# Patient Record
Sex: Male | Born: 1940
Health system: Southern US, Community
[De-identification: ages and names within clinical notes are randomized; demographics above are authoritative.]

## PROBLEM LIST (undated history)

## (undated) DIAGNOSIS — M1990 Unspecified osteoarthritis, unspecified site: Secondary | ICD-10-CM

## (undated) DIAGNOSIS — I5181 Takotsubo syndrome: Secondary | ICD-10-CM

## (undated) DIAGNOSIS — Z9889 Other specified postprocedural states: Secondary | ICD-10-CM

## (undated) DIAGNOSIS — G250 Essential tremor: Secondary | ICD-10-CM

## (undated) DIAGNOSIS — F32A Depression, unspecified: Secondary | ICD-10-CM

## (undated) DIAGNOSIS — I629 Nontraumatic intracranial hemorrhage, unspecified: Secondary | ICD-10-CM

## (undated) DIAGNOSIS — G40901 Epilepsy, unspecified, not intractable, with status epilepticus: Secondary | ICD-10-CM

## (undated) DIAGNOSIS — T4145XA Adverse effect of unspecified anesthetic, initial encounter: Secondary | ICD-10-CM

## (undated) DIAGNOSIS — T8859XA Other complications of anesthesia, initial encounter: Secondary | ICD-10-CM

## (undated) DIAGNOSIS — R251 Tremor, unspecified: Secondary | ICD-10-CM

## (undated) DIAGNOSIS — R569 Unspecified convulsions: Secondary | ICD-10-CM

## (undated) DIAGNOSIS — I5022 Chronic systolic (congestive) heart failure: Secondary | ICD-10-CM

## (undated) DIAGNOSIS — F329 Major depressive disorder, single episode, unspecified: Secondary | ICD-10-CM

## (undated) DIAGNOSIS — S0532XA Ocular laceration without prolapse or loss of intraocular tissue, left eye, initial encounter: Secondary | ICD-10-CM

## (undated) DIAGNOSIS — M654 Radial styloid tenosynovitis [de Quervain]: Secondary | ICD-10-CM

## (undated) DIAGNOSIS — R509 Fever, unspecified: Secondary | ICD-10-CM

## (undated) DIAGNOSIS — S069X9A Unspecified intracranial injury with loss of consciousness of unspecified duration, initial encounter: Secondary | ICD-10-CM

## (undated) DIAGNOSIS — S069XAA Unspecified intracranial injury with loss of consciousness status unknown, initial encounter: Secondary | ICD-10-CM

## (undated) DIAGNOSIS — R112 Nausea with vomiting, unspecified: Secondary | ICD-10-CM

## (undated) DIAGNOSIS — K219 Gastro-esophageal reflux disease without esophagitis: Secondary | ICD-10-CM

## (undated) DIAGNOSIS — S065X9A Traumatic subdural hemorrhage with loss of consciousness of unspecified duration, initial encounter: Secondary | ICD-10-CM

## (undated) DIAGNOSIS — C61 Malignant neoplasm of prostate: Secondary | ICD-10-CM

## (undated) DIAGNOSIS — G510 Bell's palsy: Secondary | ICD-10-CM

## (undated) DIAGNOSIS — S065XAA Traumatic subdural hemorrhage with loss of consciousness status unknown, initial encounter: Secondary | ICD-10-CM

## (undated) DIAGNOSIS — I428 Other cardiomyopathies: Secondary | ICD-10-CM

## (undated) DIAGNOSIS — J690 Pneumonitis due to inhalation of food and vomit: Secondary | ICD-10-CM

## (undated) DIAGNOSIS — D649 Anemia, unspecified: Secondary | ICD-10-CM

## (undated) HISTORY — DX: Unspecified intracranial injury with loss of consciousness status unknown, initial encounter: S06.9XAA

## (undated) HISTORY — DX: Anemia, unspecified: D64.9

## (undated) HISTORY — DX: Traumatic subdural hemorrhage with loss of consciousness of unspecified duration, initial encounter: S06.5X9A

## (undated) HISTORY — DX: Pneumonitis due to inhalation of food and vomit: J69.0

## (undated) HISTORY — DX: Epilepsy, unspecified, not intractable, with status epilepticus: G40.901

## (undated) HISTORY — PX: KNEE ARTHROSCOPY: SUR90

## (undated) HISTORY — DX: Fever, unspecified: R50.9

## (undated) HISTORY — DX: Nontraumatic intracranial hemorrhage, unspecified: I62.9

## (undated) HISTORY — DX: Ocular laceration without prolapse or loss of intraocular tissue, left eye, initial encounter: S05.32XA

## (undated) HISTORY — DX: Essential tremor: G25.0

## (undated) HISTORY — DX: Chronic systolic (congestive) heart failure: I50.22

## (undated) HISTORY — DX: Traumatic subdural hemorrhage with loss of consciousness status unknown, initial encounter: S06.5XAA

## (undated) HISTORY — DX: Other cardiomyopathies: I42.8

## (undated) HISTORY — PX: ANTERIOR CRUCIATE LIGAMENT REPAIR: SHX115

## (undated) HISTORY — DX: Unspecified convulsions: R56.9

## (undated) HISTORY — DX: Unspecified intracranial injury with loss of consciousness of unspecified duration, initial encounter: S06.9X9A

## (undated) HISTORY — DX: Takotsubo syndrome: I51.81

## (undated) HISTORY — PX: HEMORRHOID SURGERY: SHX153

---

## 1999-06-22 ENCOUNTER — Emergency Department (HOSPITAL_COMMUNITY): Admission: EM | Admit: 1999-06-22 | Discharge: 1999-06-22 | Payer: Self-pay | Admitting: Internal Medicine

## 2000-04-14 ENCOUNTER — Emergency Department (HOSPITAL_COMMUNITY): Admission: EM | Admit: 2000-04-14 | Discharge: 2000-04-14 | Payer: Self-pay

## 2000-04-18 ENCOUNTER — Emergency Department (HOSPITAL_COMMUNITY): Admission: EM | Admit: 2000-04-18 | Discharge: 2000-04-18 | Payer: Self-pay | Admitting: Emergency Medicine

## 2000-05-03 ENCOUNTER — Emergency Department (HOSPITAL_COMMUNITY): Admission: EM | Admit: 2000-05-03 | Discharge: 2000-05-03 | Payer: Self-pay | Admitting: Emergency Medicine

## 2000-10-03 ENCOUNTER — Encounter: Admission: RE | Admit: 2000-10-03 | Discharge: 2000-10-03 | Payer: Self-pay | Admitting: Orthopedic Surgery

## 2000-10-03 ENCOUNTER — Encounter: Payer: Self-pay | Admitting: Orthopedic Surgery

## 2000-10-13 ENCOUNTER — Encounter: Payer: Self-pay | Admitting: Orthopedic Surgery

## 2000-10-13 ENCOUNTER — Ambulatory Visit (HOSPITAL_COMMUNITY): Admission: RE | Admit: 2000-10-13 | Discharge: 2000-10-13 | Payer: Self-pay | Admitting: Orthopedic Surgery

## 2002-04-16 ENCOUNTER — Emergency Department (HOSPITAL_COMMUNITY): Admission: EM | Admit: 2002-04-16 | Discharge: 2002-04-16 | Payer: Self-pay | Admitting: Emergency Medicine

## 2002-04-16 ENCOUNTER — Encounter: Payer: Self-pay | Admitting: Emergency Medicine

## 2002-04-18 ENCOUNTER — Emergency Department (HOSPITAL_COMMUNITY): Admission: EM | Admit: 2002-04-18 | Discharge: 2002-04-18 | Payer: Self-pay | Admitting: Emergency Medicine

## 2002-04-19 ENCOUNTER — Emergency Department (HOSPITAL_COMMUNITY): Admission: EM | Admit: 2002-04-19 | Discharge: 2002-04-19 | Payer: Self-pay | Admitting: Emergency Medicine

## 2003-01-02 ENCOUNTER — Emergency Department (HOSPITAL_COMMUNITY): Admission: EM | Admit: 2003-01-02 | Discharge: 2003-01-02 | Payer: Self-pay | Admitting: Emergency Medicine

## 2003-01-07 ENCOUNTER — Emergency Department (HOSPITAL_COMMUNITY): Admission: EM | Admit: 2003-01-07 | Discharge: 2003-01-07 | Payer: Self-pay | Admitting: *Deleted

## 2003-03-09 ENCOUNTER — Ambulatory Visit (HOSPITAL_COMMUNITY): Admission: RE | Admit: 2003-03-09 | Discharge: 2003-03-09 | Payer: Self-pay | Admitting: Endocrinology

## 2004-10-23 ENCOUNTER — Ambulatory Visit (HOSPITAL_COMMUNITY): Admission: RE | Admit: 2004-10-23 | Discharge: 2004-10-23 | Payer: Self-pay | Admitting: Endocrinology

## 2005-03-01 ENCOUNTER — Ambulatory Visit (HOSPITAL_COMMUNITY): Admission: RE | Admit: 2005-03-01 | Discharge: 2005-03-01 | Payer: Self-pay | Admitting: Orthopedic Surgery

## 2005-04-11 ENCOUNTER — Encounter: Admission: RE | Admit: 2005-04-11 | Discharge: 2005-04-11 | Payer: Self-pay | Admitting: Orthopedic Surgery

## 2005-04-15 ENCOUNTER — Ambulatory Visit (HOSPITAL_COMMUNITY): Admission: RE | Admit: 2005-04-15 | Discharge: 2005-04-15 | Payer: Self-pay | Admitting: Orthopedic Surgery

## 2005-04-15 ENCOUNTER — Ambulatory Visit (HOSPITAL_BASED_OUTPATIENT_CLINIC_OR_DEPARTMENT_OTHER): Admission: RE | Admit: 2005-04-15 | Discharge: 2005-04-15 | Payer: Self-pay | Admitting: Orthopedic Surgery

## 2007-04-29 ENCOUNTER — Inpatient Hospital Stay (HOSPITAL_COMMUNITY): Admission: RE | Admit: 2007-04-29 | Discharge: 2007-05-02 | Payer: Self-pay | Admitting: Orthopedic Surgery

## 2007-04-29 HISTORY — PX: TOTAL KNEE ARTHROPLASTY: SHX125

## 2007-05-21 ENCOUNTER — Emergency Department (HOSPITAL_COMMUNITY): Admission: EM | Admit: 2007-05-21 | Discharge: 2007-05-21 | Payer: Self-pay | Admitting: Emergency Medicine

## 2007-06-29 ENCOUNTER — Ambulatory Visit (HOSPITAL_BASED_OUTPATIENT_CLINIC_OR_DEPARTMENT_OTHER): Admission: RE | Admit: 2007-06-29 | Discharge: 2007-06-29 | Payer: Self-pay | Admitting: Orthopedic Surgery

## 2007-06-29 HISTORY — PX: KNEE JOINT MANIPULATION: SHX386

## 2007-09-25 ENCOUNTER — Ambulatory Visit (HOSPITAL_COMMUNITY): Admission: RE | Admit: 2007-09-25 | Discharge: 2007-09-26 | Payer: Self-pay | Admitting: Orthopedic Surgery

## 2008-03-18 ENCOUNTER — Ambulatory Visit (HOSPITAL_COMMUNITY): Admission: RE | Admit: 2008-03-18 | Discharge: 2008-03-20 | Payer: Self-pay | Admitting: Orthopedic Surgery

## 2008-03-18 HISTORY — PX: KNEE ARTHROTOMY: SUR107

## 2009-06-02 ENCOUNTER — Emergency Department (HOSPITAL_COMMUNITY): Admission: EM | Admit: 2009-06-02 | Discharge: 2009-06-02 | Payer: Self-pay | Admitting: Family Medicine

## 2009-06-05 ENCOUNTER — Emergency Department (HOSPITAL_COMMUNITY): Admission: EM | Admit: 2009-06-05 | Discharge: 2009-06-05 | Payer: Self-pay | Admitting: Family Medicine

## 2009-06-10 ENCOUNTER — Emergency Department (HOSPITAL_COMMUNITY): Admission: EM | Admit: 2009-06-10 | Discharge: 2009-06-10 | Payer: Self-pay | Admitting: Emergency Medicine

## 2009-06-16 ENCOUNTER — Ambulatory Visit (HOSPITAL_COMMUNITY): Admission: RE | Admit: 2009-06-16 | Discharge: 2009-06-16 | Payer: Self-pay | Admitting: Urology

## 2009-06-21 ENCOUNTER — Ambulatory Visit (HOSPITAL_COMMUNITY): Admission: RE | Admit: 2009-06-21 | Discharge: 2009-06-21 | Payer: Self-pay | Admitting: Urology

## 2009-07-13 ENCOUNTER — Encounter (INDEPENDENT_AMBULATORY_CARE_PROVIDER_SITE_OTHER): Payer: Self-pay | Admitting: Interventional Radiology

## 2009-07-13 ENCOUNTER — Ambulatory Visit (HOSPITAL_COMMUNITY): Admission: RE | Admit: 2009-07-13 | Discharge: 2009-07-13 | Payer: Self-pay | Admitting: Urology

## 2009-07-25 ENCOUNTER — Ambulatory Visit: Admission: RE | Admit: 2009-07-25 | Discharge: 2009-10-13 | Payer: Self-pay | Admitting: Radiation Oncology

## 2009-09-20 ENCOUNTER — Ambulatory Visit (HOSPITAL_COMMUNITY): Admission: RE | Admit: 2009-09-20 | Discharge: 2009-09-20 | Payer: Self-pay | Admitting: Gastroenterology

## 2009-10-20 ENCOUNTER — Ambulatory Visit: Admission: RE | Admit: 2009-10-20 | Discharge: 2009-12-25 | Payer: Self-pay | Admitting: Radiation Oncology

## 2010-03-12 ENCOUNTER — Emergency Department (HOSPITAL_COMMUNITY): Admission: EM | Admit: 2010-03-12 | Discharge: 2010-03-12 | Payer: Self-pay | Admitting: Emergency Medicine

## 2010-04-12 ENCOUNTER — Ambulatory Visit (HOSPITAL_COMMUNITY): Admission: RE | Admit: 2010-04-12 | Discharge: 2010-04-12 | Payer: Self-pay | Admitting: Orthopedic Surgery

## 2010-05-03 ENCOUNTER — Ambulatory Visit (HOSPITAL_BASED_OUTPATIENT_CLINIC_OR_DEPARTMENT_OTHER): Admission: RE | Admit: 2010-05-03 | Discharge: 2010-05-03 | Payer: Self-pay | Admitting: Orthopedic Surgery

## 2010-05-03 HISTORY — PX: WRIST ARTHROSCOPY: SHX838

## 2010-11-04 ENCOUNTER — Encounter: Payer: Self-pay | Admitting: Orthopedic Surgery

## 2010-11-05 ENCOUNTER — Encounter: Payer: Self-pay | Admitting: Urology

## 2010-12-29 LAB — POCT HEMOGLOBIN-HEMACUE: Hemoglobin: 12.6 g/dL — ABNORMAL LOW (ref 13.0–17.0)

## 2010-12-29 LAB — BASIC METABOLIC PANEL
CO2: 30 mEq/L (ref 19–32)
Chloride: 99 mEq/L (ref 96–112)
Creatinine, Ser: 0.91 mg/dL (ref 0.4–1.5)
Sodium: 134 mEq/L — ABNORMAL LOW (ref 135–145)

## 2010-12-29 LAB — GLUCOSE, CAPILLARY: Glucose-Capillary: 219 mg/dL — ABNORMAL HIGH (ref 70–99)

## 2011-01-18 LAB — GLUCOSE, CAPILLARY

## 2011-01-18 LAB — CBC
Hemoglobin: 13 g/dL (ref 13.0–17.0)
MCV: 92.1 fL (ref 78.0–100.0)
Platelets: 209 10*3/uL (ref 150–400)
RDW: 14.1 % (ref 11.5–15.5)
WBC: 4.6 10*3/uL (ref 4.0–10.5)

## 2011-01-18 LAB — CREATININE, SERUM
Creatinine, Ser: 0.81 mg/dL (ref 0.4–1.5)
GFR calc Af Amer: >60 mL/min (ref >60)

## 2011-01-18 LAB — PROTIME-INR
INR: 1 (ref 0.00–1.49)
Prothrombin Time: 13.5 seconds (ref 11.6–15.2)

## 2011-01-19 LAB — PROTIME-INR
INR: 1 (ref 0.00–1.49)
Prothrombin Time: 13.3 seconds (ref 11.6–15.2)

## 2011-01-19 LAB — CBC
MCHC: 33.9 g/dL (ref 30.0–36.0)
MCV: 93.8 fL (ref 78.0–100.0)
Platelets: 215 10*3/uL (ref 150–400)
RBC: 4.26 MIL/uL (ref 4.22–5.81)
WBC: 4.9 10*3/uL (ref 4.0–10.5)

## 2011-01-19 LAB — DIFFERENTIAL
Lymphocytes Relative: 37 % (ref 12–46)
Lymphs Abs: 1.8 10*3/uL (ref 0.7–4.0)
Monocytes Relative: 8 % (ref 3–12)
Neutrophils Relative %: 52 % (ref 43–77)

## 2011-01-19 LAB — BASIC METABOLIC PANEL
GFR calc non Af Amer: >60 mL/min (ref >60)
Potassium: 4.6 mEq/L (ref 3.5–5.1)

## 2011-01-19 LAB — GLUCOSE, CAPILLARY

## 2011-01-19 LAB — APTT: aPTT: 34 seconds (ref 24–37)

## 2011-02-26 NOTE — Op Note (Signed)
NAMEWILGUS, [name] NO.:  [id number]   MEDICAL RECORD NO.:  [id number]          PATIENT TYPE:  OIB   LOCATION:  1613                         FACILITY:  [facility]   PHYSICIAN:  Ollen Gross, M.D.    DATE OF BIRTH:  12-27-1940   DATE OF PROCEDURE:  09/25/2007  DATE OF DISCHARGE:                               OPERATIVE REPORT   PREOPERATIVE DIAGNOSIS:  Arthrofibrosis. left knee.   POSTOPERATIVE DIAGNOSIS:  Arthrofibrosis. left knee.   PROCEDURE:  Left knee arthroscopy, lysis of adhesions, and manipulation.   SURGEON:  Ollen Gross, M.D.   ASSISTANT:  None.   ANESTHESIA:  General.   ESTIMATED BLOOD LOSS:  Minimal.   DRAINS:  Hemovac x1.   COMPLICATIONS:  None.   CONDITION:  Stable to recovery.   CLINICAL NOTE:  Mr. [name] is a 70 year old male who has a stiff left  total knee arthroplasty.  His initial surgery was done in July of 2008.  He progressed slowly with physical therapy and developed adhesions.  He  had a closed manipulation, and despite that has been stuck around 90  degrees of flexion with pain.  His radiographs have looked fine, with no  evidence of any prosthetic abnormalities.  He has dense scar tissue.  He  presents now for arthroscopic lysis of adhesions and manipulation.   PROCEDURE IN DETAIL:  After the successful administration of general  anesthetic, exam under anesthesia was performed.  His range of motion  was about 5-90 degrees.  A tourniquet was then placed high on the left  thigh, and his left lower extremity was prepped and draped in the usual  sterile fashion.   Standard superomedial and inferolateral incisions were made.  Inflow  cannula passed superomedial and camera passed inferolateral.  Arthroscopic visualization proceeds.  He had a significant amount of  dense scar tissue in the suprapatellar area and in the medial and  lateral gutters.  I created a superomedial portal, and I used a shaver  to debride the majority of  the scar tissue.  We then used the ArthroCare  to remove the rest.  This effectively cleared out the suprapatellar  area, leaving a nice wide open space between the undersurface of the  quad tendon and the femur.  The medial and lateral gutters were also  debrided.  I was able to debride the infrapatellar fat pad also.  The  ArthroCare was used to perform this.  There was a nodular scar on the  intercondylar notch and prosthesis, and with the shaver, I was able to  remove that without causing any harm to the tibial polyethylene.  I then  removed the arthroscopic equipment.  I went to flex the knee and was  able to get him flexed to 120 degrees.  We placed the inflow cannula  back into the joint and threaded a Hemovac drain through the inflow  cannula, subsequently removing the cannula.  The incision was then  closed with interrupted 4-0 nylon, but the drain was not sewn in.  20 mL  of 0.25% Marcaine with epinephrine were injected into the  joint.  The  drain was hooked to suction.  The knee was cleaned and dried, and a  bulky sterile dressing was applied.  The drain was hooked to suction.   He was awakened and transferred to recovery in stable condition.      Ollen Gross, M.D.  Electronically Signed     FA/MEDQ  D:  09/25/2007  T:  09/27/2007  Job:  [id number]

## 2011-02-26 NOTE — Op Note (Signed)
NAME:  FERD, HORRIGAN NO.:  [id number]   MEDICAL RECORD NO.:  [id number]          PATIENT TYPE:  AMB   LOCATION:  DAY                          FACILITY:  [facility]   PHYSICIAN:  Ollen Gross, M.D.    DATE OF BIRTH:  1941-08-19   DATE OF PROCEDURE:  06/29/2007  DATE OF DISCHARGE:                               OPERATIVE REPORT   PREOPERATIVE DIAGNOSIS:  Arthrofibrosis, left knee.   POSTOPERATIVE DIAGNOSIS:  Arthrofibrosis, left knee.   PROCEDURE:  Left knee closed manipulation.   SURGEON:  Ollen Gross, M.D. No assistant.   ANESTHESIA:  General.   COMPLICATIONS:  None.   Pre-manipulation range of motion 10-85; post-manipulation range of  motion 5-120.   BRIEF CLINICAL NOTE:  Mr. Tollison is a 70 year old male who had a left  total knee arthroplasty done approximately two months ago.  He had a  very slow start to rehab.  He has gone on to develop severe stiffness of  the knee despite extensive physical therapy.  He presents now for closed  manipulation.   PROCEDURE IN DETAIL:  After successful administration of general  anesthetic, exam under anesthesia showed range 10-85.  I placed my chest  on the proximal tibia and flexed the knee with audible lysis of  adhesions.  I was able to get him flexed to 120 degrees.  I was then  able to fully extend him also.  Patellar mobility had improved.  He was  subsequently awakened and transferred to recovery in stable condition.      Ollen Gross, M.D.  Electronically Signed     FA/MEDQ  D:  06/29/2007  T:  06/29/2007  Job:  [id number]

## 2011-02-26 NOTE — H&P (Signed)
NAME:  Dale Morrow, Dale Morrow NO.:  [id number]   MEDICAL RECORD NO.:  [id number]        PATIENT TYPE:  LINP   LOCATION:                               FACILITY:  [facility]   PHYSICIAN:  Ollen Gross, M.D.    DATE OF BIRTH:  Mar 11, 1941   DATE OF ADMISSION:  04/29/2007  DATE OF DISCHARGE:                              HISTORY & PHYSICAL   CHIEF COMPLAINT:  Left knee pain.   HISTORY OF PRESENT ILLNESS:  The patient is a 70 year old male who has  been seen by Dr. Lequita Halt for ongoing left knee pain that has been  ongoing for quite some time now, has been treated by Dr. Rennis Chris in the  past in 2004 with a knee arthroscopy.  He was told at that time he had  significant arthritis.  He has had a total of 5 or 6 scopes on the left  knee.  It is unfortunately gotten worse with time.  He is seen by Dr.  Lequita Halt in consultation and found to have significant bone-on-bone  arthritis in the left knee, it is mainly central in both the medial and  lateral compartments as well as significant patellofemoral changes.  It  is felt he has reached a point where he would benefit from undergoing  surgical intervention.  Risks and benefits have been discussed.  He  elects to proceed with surgery.  He has been seen preoperative by Dr.  Dagoberto Ligas and felt to be stable to undergo surgery.   ALLERGIES:  NO KNOWN DRUG ALLERGIES.  FOOD AND OTHER ALLERGIES:  HARD  SHELL SEAFOOD AND BEE STINGS.   CURRENT MEDICATIONS:  He is on an insulin pump, Crestor, lisinopril,  Actos, aspirin, multivitamin.   PAST MEDICAL HISTORY:  Diabetes mellitus, ulcerative colitis,  dyslipidemia, history of depression, history of dermatitis.   PAST SURGICAL HISTORY:  Multiple left knee arthroscopies, right knee ACL  repair, left knee tibial plateau fracture repair.   SOCIAL HISTORY:  Married.  Works as a Human resources officer, self-  employed.  Nonsmoker.  No alcohol.  No children.  His wife will be  assisting with care after  surgery.  Two-story home that is on the main  level, has 12 steps.   FAMILY HISTORY:  Diabetes.   REVIEW OF SYSTEMS:  GENERAL:  No fevers, chills, night sweats.  NEURO:  No seizures, syncope, paralysis.  RESPIRATORY:  No shortness of breath,  productive cough or hemoptysis.  CARDIOVASCULAR AND CHEST:  No chest  pain, orthopnea.  GI: No nausea, vomiting, diarrhea, or constipation.  Does have a history of ulcerative colitis.  GU: No dysuria, hematuria or  discharge.  MUSCULOSKELETAL: Left knee.   PHYSICAL EXAMINATION:  VITAL SIGNS: Pulse 64, respirations 12, blood  pressure 132/70.  GENERAL:  A 70 year old white male, well-nourished, well-developed, in  no acute distress, thin frame, alert and oriented, cooperative.  HEENT: Normocephalic, atraumatic.  Pupils round, reactive.  Never wore  glasses.  NECK:  Supple.  CHEST:  Clear anterior and posterior chest walls.  No rhonchi, rales or  wheezing.  HEART: Regular rate and rhythm.  No  murmur.  S1, S2 noted.  ABDOMEN:  Soft, flat, nontender.  Bowel sounds present.  RECTAL, BREASTS, GENITALIA:  Not done, per present illness.  EXTREMITIES:  Left knee shows range of motion of 10-120, marked  crepitus, no effusion, slight varus.   IMPRESSION:  1. Osteoarthritis, left knee.  2. Diabetes mellitus.  3. Ulcerative colitis.  4. Dyslipidemia.  5. Depression.  6. Dermatitis.   PLAN:  The patient will be admitted to [facility] to undergo a  left total knee replacement arthroplasty.  Surgery will be performed by  Dr. Ollen Gross, his medical physician Dr. Corrin Parker will be  notified of the room number and admission and be consulted if needed for  medical assistance with the patient throughout the hospital course.      Alexzandrew L. Perkins, P.A.C.      Ollen Gross, M.D.  Electronically Signed    ALP/MEDQ  D:  04/28/2007  T:  04/29/2007  Job:  [id number]   cc:   Alfonse Alpers. Dagoberto Ligas, M.D.  Fax: [phone]   Ollen Gross, M.D.  Fax: [phone]

## 2011-02-26 NOTE — Op Note (Signed)
NAMESAMEER, TEEPLE NO.:  [id number]   MEDICAL RECORD NO.:  [id number]          PATIENT TYPE:  INP   LOCATION:  X001                         FACILITY:  [facility]   PHYSICIAN:  Ollen Gross, M.D.    DATE OF BIRTH:  [birth year]   DATE OF PROCEDURE:  04/29/2007  DATE OF DISCHARGE:                               OPERATIVE REPORT   PREOPERATIVE DIAGNOSIS:  Osteoarthritis left knee.   POSTOPERATIVE DIAGNOSIS:  Osteoarthritis left knee.   PROCEDURE:  Left total knee arthroplasty.   SURGEON:  Ollen Gross, M.D.   ASSISTANT:  Avel Peace PA-C   ANESTHESIA:  General with postop Marcaine pain pump.   ESTIMATED BLOOD LOSS:  Minimal.   DRAINS:  None.   TOURNIQUET TIME:  33 minutes at 300 mmHg.   COMPLICATIONS:  None.   CONDITION:  Stable to recovery.   CLINICAL NOTE:  [name] is a 70 year old male who has end-stage  arthritis of his left knee with progressively worsening pain and  dysfunction.  He presents now for left total knee arthroplasty.   PROCEDURE IN DETAIL:  After successful initiation of general anesthetic,  a tourniquet placed high on the left thigh and left lower extremity  prepped and draped in usual sterile fashion.  Extremity was wrapped in  Esmarch, knee flexed, tourniquet inflated 300 mmHg.  Midline incision  made with 10 blade through subcutaneous tissue to level of the extensor  mechanism.  Fresh blade is used make a medial parapatellar arthrotomy.  Soft tissue over the proximal medial tibia subperiosteally elevated to  the joint line with the knife into the semimembranosus bursa with a Cobb  elevator.  Soft tissue laterally is elevated with attention being paid  to avoid patellar tendon on tibial tubercle.  Patella subluxed  laterally, knee flexed 90 degrees, ACL and PCL removed.  Drill was used  to create a starting hole in the distal femur and the canal was  thoroughly irrigated.  5 degrees left valgus alignment guide is placed,  referencing off the posterior condyles, rotations marked and the block  pinned to remove 10 mm of the distal femur.  Distal femoral resection is  made with an oscillating saw.  Sizing blocks placed, size 4 is most  appropriate.  Rotations marked at the epicondylar axis.  Size 4 cutting  block was placed and the anterior-posterior and chamfer cuts made.   Tibia subluxed forward and menisci are removed.  Extramedullary tibial  alignment guide is placed referencing proximally at the medial aspect of  the tibial tubercle and distally along the second metatarsal axis and  tibial crest.  Blocks pinned to remove about 8 mm off the less deficient  lateral side.  There is deficiency on both sides.  Tibial resection is  made with an oscillating saw.  Size 4 is the most appropriate tibial  component and then the proximal tibia was prepared with the modular  drill and keel punch for size 4.  Femoral preparation is completed the  intercondylar cut.   Size 4 mobile bearing tibial trial, size 4 posterior stabilized femoral  trial  and a 10-mm posterior stabilized rotating platform insert trial  are placed.  With a 10 full extensions achieved with excellent varus and  valgus balance throughout full range of motion.  The patella was  everted, thickness measured to be 24 mm.  Freehand resection taken to 14  mm, 38 template is placed, lug holes were drilled, trial patella was  placed and it tracks normally.  Osteophytes removed off the posterior  femur with the trial in place.  All trials removed and the cut bone  surfaces are prepared with pulsatile lavage.  Cement mixed and once  ready for implantation the size 4 mobile bearing tibial tray size 4  posterior stabilized femur and 38 patella are cemented into place.  The  patella was held with the clamp.  Trial 10-mm inserts placed, knee held  in full extension and all extruded cement removed.  Wound was then  copiously irrigated with saline solution.  The  trial removed and FloSeal  injected posteriorly.  The permanent 10 mm posterior stabilized rotating  platform insert is then placed in the tibial tray.  The FloSeal injected  into medial and lateral gutters and the suprapatellar area.  The  tourniquet then released with total time of 34 minutes.  Moist sponge is  held on the wound for about a minute and a half to two minutes and then  that is removed and there was minimal if any bleeding.  This stopped  with electrocautery.  Further irrigation was performed and the extensor  mechanism was closed with interrupted #1 PDS.  Flexion against gravity  is 135 degrees.  Subcu is then closed with interrupted 2-0 Vicryl  subcuticular running 4-0 Monocryl.  Catheter for Marcaine pain pump is  placed and the pump is initiated.  Steri-Strips and bulky sterile  dressing are applied.  She is placed into the knee immobilizer,  awakened, transferred to recovery in stable condition.      Ollen Gross, M.D.  Electronically Signed     FA/MEDQ  D:  04/29/2007  T:  04/30/2007  Job:  [id number]

## 2011-02-26 NOTE — Op Note (Signed)
NAME:  LUCY, WOOLEVER NO.:  [id number]   MEDICAL RECORD NO.:  [id number]          PATIENT TYPE:  OIB   LOCATION:  0098                         FAC[facility]   PHYSICIAN:  Ollen Gross, M.D.    DATE OF BIRTH:  [birth year]   DATE OF PROCEDURE:  03/18/2008  DATE OF DISCHARGE:                               OPERATIVE REPORT   PREOPERATIVE DIAGNOSIS:  Arthrofibrosis left knee.   POSTOPERATIVE DIAGNOSIS:  Arthrofibrosis left knee.   PROCEDURE:  Left knee open arthrotomy with scar excision.   SURGEON:  Ollen Gross, M.D.   ASSISTANT:  Alexzandrew L. Perkins, P.A.C.   ANESTHESIA:  General.   BLOOD LOSS:  Minimal.   DRAINS:  Hemovac times one.   TOURNIQUET TIME:  21 minutes at 300 mmHg.   COMPLICATIONS:  None.   CONDITION:  Stable to recovery.   BRIEF CLINICAL NOTE:  Mr. Swab is a 70 year old male who had a left  total knee arthroplasty done last July.  He has had a course problematic  for stiffness in his left knee.  He underwent a closed manipulation  postoperatively and then had an arthroscopy with lysis of adhesions.  He  has been stuck at about 95 degrees of flexion.  He has maintained full  extension.  He has painful stiffness of the knee.  His workup for  infection has been negative.  He presents now for open arthrotomy with  scar excision and inspection of the prosthesis.   PROCEDURE IN DETAIL:  After successful administration of general  anesthetic a tourniquet is placed high on his left thigh and left lower  extremity is prepped and draped in the usual sterile fashion.  Extremity  was wrapped in Esmarch and tourniquet inflated to 300 mmHg.  Midline  incision made with 10 blade through subcutaneous tissue to the extensor  mechanism.  A fresh blade was used to make a medial parapatellar  arthrotomy.  He had very thick scar underlining the entire extensor  mechanism and he had scarring at the infrapatellar fat pad.  I used a  knife to dissect  the interval between the undersurface of the extensor  mechanism and the scar, excising all the scar.  I started proximally,  recreating the medial and lateral gutters around the femur and  dissecting up underneath the quad muscle in order to free any scar  tissue there.  I effectively freed up proximally and then distally  excised the scar medially and laterally.  I was able to enter the plane  between the fat pad and scar and excise all the scar at the fat pad and  restore the normal height of the patella.  The patellar component looked  fine and was well fixed.  The femoral and tibial components were also  well fixed and the polyethylene showed no signs of damage.  After I  removed all the scar then I placed towel clips on the extensor mechanism  and flexion against gravity at that point is 115 degrees.  With just a  little forcing I could get him over 120.  I removed  the towel clips and  thoroughly irrigated the knee with saline solution.  I inspected again  and did not see any further scarring.  A Hemovac drain is placed and  then the knee arthrotomy was closed with interrupted #1 PDS.  Flexion  against gravity is about 105 and then with just a little bit of pressure  on the proximal tibia I got him easily past 120.  The drain was hooked  to suction and the tourniquet released for total time of 21 minutes.  Subcu was closed with interrupted 2-0 Vicryl and subcuticular running 4-  0 Monocryl.  The incisions were cleaned and dried and Steri-Strips and  sterile dressing applied.  He was then awakened and transferred to  recovery in stable condition.      Ollen Gross, M.D.  Electronically Signed     FA/MEDQ  D:  03/18/2008  T:  03/18/2008  Job:  [id number]

## 2011-02-26 NOTE — Discharge Summary (Signed)
NAMEJOSHUAJAMES, MOEHRING                ACCOUNT NO.:  [id number]   MEDICAL RECORD NO.:  [id number]          PATIENT TYPE:  INP   LOCATION:  1607                         FACILITY:  [facility]   PHYSICIAN:  Ollen Gross, M.D.    DATE OF BIRTH:  05-Jul-1941   DATE OF ADMISSION:  04/29/2007  DATE OF DISCHARGE:  05/02/2007                               DISCHARGE SUMMARY   ADMISSION DIAGNOSES:  1. Osteoarthritis left knee.  2. Diabetes mellitus.  3. Ulcerative colitis.  4. Dyslipidemia.  5. Depression.  6. Dermatitis   DISCHARGE DIAGNOSES:  1. Osteoarthritis left knee status post left total knee arthroplasty.  2. Mild postop blood loss anemia.  Did not require transfusion.  3. Diabetes mellitus.  4. Ulcerative colitis.  5. Dyslipidemia.  6. Depression.  7. Dermatitis   PROCEDURE:  April 29, 2007; left total knee.  Surgeon Dr. Lequita Halt,  assisted Alexzandrew L. Perkins, P.A.C.  Anesthesia general.   CONSULTS:  None.   BRIEF HISTORY:  Mr. Virgin is a 70 year old male with end-stage  arthritis of the left knee, progressive worsening pain, dysfunction who  now presents for total knee.   LABORATORY DATA:  Preop CBC showed hemoglobin 14.2, hematocrit of 41.5,  white cell count 4.7, postop hemoglobin down to 12.9, then 9.8, last H  and H stabilized at 9.4 and 26.7.  PT/PTT preop 13.0, 33 respectively.  INR 1.0.  Serial protimes followed; PT/INR 27.5 and 2.4.  Chem panel on  admission; slightly elevated glucose of 125.  Remaining Chem panel  within normal limits.  Serial chemistries followed, electrolytes  remained within normal limits.  Preop UA negative.  Blood group type B+.  Preop EKG on the chart dated April 06, 2007, sinus rhythm within normal  limits.  Normal EKG.   HOSPITAL COURSE:  The patient was admitted to [facility] and  tolerated procedure well, later transferred from recovery room to the  orthopedic floor start on PCA and analgesic pain control following  surgery.  He  did have some elevated serum glucose, he was on insulin  pump protocol is back on his Actos. Blood pressure was stable.  We would  recommend holding his lisinopril as his blood pressure was low.  He did  have a little bit of thigh pain which was felt to be due to the  tourniquet, otherwise he was doing pretty well. On day one, started  getting up out of bed with therapy.  By day two, he was doing well.  He  got up and ambulated about 60 feet, progressing well.  He is tolerating  his meds.  Recommend discontinuing IV fluids.  Continued working on his  mobility and he was ready to go home by the following day of May 02, 2007.   DISCHARGE/PLAN:  1. Patient was discharged home on May 02, 2007.  2. Discharge diagnoses, please see above.  3. Discharge medications; Coumadin, Percocet, Robaxin, Nu-Iron.   DIET:  Diabetic, low cholesterol diet.   FOLLOWUP:  Follow-up in two weeks.   ACTIVITY:  Weightbearing as tolerated, home health, PT home health  nursing total knee protocol.   DISPOSITION:  Home.   CONDITION ON DISCHARGE:  Improving.      Alexzandrew L. Perkins, P.A.C.      Ollen Gross, M.D.  Electronically Signed    ALP/MEDQ  D:  05/14/2007  T:  05/15/2007  Job:  [id number]   cc:   Alfonse Alpers. Dagoberto Ligas, M.D.  Fax: [phone]

## 2011-03-01 NOTE — Op Note (Signed)
NAMEJOMAR, Dale Morrow                ACCOUNT NO.:  [id number]   MEDICAL RECORD NO.:  [id number]          PATIENT TYPE:  AMB   LOCATION:  DSC                          FACILITY:  [facility]   PHYSICIAN:  Robert A. Thurston Hole, M.D. DATE OF BIRTH:  27-Oct-1940   DATE OF PROCEDURE:  04/15/2005  DATE OF DISCHARGE:                                 OPERATIVE REPORT   PREOPERATIVE DIAGNOSIS:  Left knee lateral meniscal tear with chondromalacia  and synovitis.   POSTOPERATIVE DIAGNOSIS:  Left knee lateral meniscal tear with  chondromalacia and synovitis.   PROCEDURE:  1.  Left knee examination under anesthesia, followed by arthroscopic partial      lateral meniscectomy.  2.  Left knee chondroplasty with partial synovectomy.   SURGEON:  Elana Alm. Thurston Hole, M.D.   ASSISTANT:  Julien Girt, P.A.   ANESTHESIA:  General.   OPERATIVE TIME:  30 minutes.   COMPLICATIONS:  None.   INDICATIONS FOR PROCEDURE:  Dale Morrow is a 70 year old gentleman who has  had 2 to 3 months of increasing left knee pain with exam and MRI documenting  chondromalacia, meniscal tear and synovitis who has failed conservative care  and is now to undergo arthroscopy.   DESCRIPTION OF PROCEDURE:  Dale Morrow was brought to the operating room on  April 15, 2005 and placed on the operating table in the supine position. After  an adequate level of general anesthesia was obtained, his left knee was  examined with range of motion from 0-130 degrees, 2+ crepitation, knee  stable, ligamentous exam with normal patellar tracking.  The knee was  sterilely injected with 0.25% Marcaine with epinephrine. The left leg was  prepped using sterile Hibiclens and draped using sterile technique.  He  received Ancef 1 g IV preoperatively for prophylaxis.  Initially, through an  anterolateral portal the arthroscope with a pump attached was placed into an  anteromedial portal and an arthroscopic probe was placed.  On initial  inspection of the  medial compartment he was found to have 30-40% grade 3  chondromalacia which was debrided.  The medial meniscus was intact.  The ACL  and PCL were normal.  The lateral compartment showed 30% grade 3  chondromalacia which was debrided.  He had tearing of the anterior posterior  and lateral horn of the lateral meniscus and 30% was resected back to a  stable rim. The patellofemoral joint showed 75% grade 3 chondromalacia on  the patellofemoral groove and this was debrided.  The patella tracked  normally.  There was very scarred and inflamed synovium in the medial and  lateral gutters and this was thoroughly debrided, otherwise they were free  of pathology. After this was done, it was felt that all pathology had been  satisfactorily addressed.  The instruments were removed.  The portals were  closed with 3-0 nylon suture and injected with 0.25% Marcaine with  epinephrine and 4 mg of morphine, sterile dressings applied and the patient  awakened and taken to the recovery room in stable condition.   FOLLOW-UP CARE:  Dale Morrow is to be followed as an  outpatient on Percocet  and Naprosyn.  I will see him back in the office in a week for sutures out  and followup.       RAW/MEDQ  D:  04/15/2005  T:  04/15/2005  Job:  [id number]

## 2011-07-11 LAB — COMPREHENSIVE METABOLIC PANEL
ALT: 21
AST: 33
Alkaline Phosphatase: 61
CO2: 30
Calcium: 8.9
Chloride: 100
GFR calc Af Amer: >60
GFR calc non Af Amer: >60
Potassium: 4.9
Sodium: 138
Total Bilirubin: 0.8

## 2011-07-11 LAB — URINALYSIS, ROUTINE W REFLEX MICROSCOPIC
Glucose, UA: NEGATIVE
Hgb urine dipstick: NEGATIVE
Ketones, ur: NEGATIVE
pH: 5.5

## 2011-07-11 LAB — CBC
Hemoglobin: 14.5
RBC: 4.65
WBC: 4.7

## 2011-07-22 LAB — CBC
HCT: 35.3 — ABNORMAL LOW
MCV: 86.3
RBC: 4.09 — ABNORMAL LOW
WBC: 4.7

## 2011-07-22 LAB — URINALYSIS, ROUTINE W REFLEX MICROSCOPIC
Bilirubin Urine: NEGATIVE
Glucose, UA: 500 — AB
Ketones, ur: NEGATIVE
pH: 5

## 2011-07-22 LAB — BASIC METABOLIC PANEL
BUN: 14
Chloride: 103
GFR calc Af Amer: >60
Potassium: 4

## 2011-07-25 LAB — POCT I-STAT 4, (NA,K, GLUC, HGB,HCT)
Glucose, Bld: 128 — ABNORMAL HIGH
Hemoglobin: 13.6
Potassium: 4.8

## 2011-07-29 LAB — BASIC METABOLIC PANEL
BUN: 11
BUN: 9
CO2: 27
Chloride: 99
Creatinine, Ser: 0.8
GFR calc non Af Amer: >60
Glucose, Bld: 246 — ABNORMAL HIGH
Potassium: 4.5

## 2011-07-29 LAB — CBC
HCT: 27.7 — ABNORMAL LOW
MCHC: 34.7
MCHC: 35.1
MCHC: 35.3
MCV: 88.7
MCV: 89.3
MCV: 89.5
Platelets: 154
Platelets: 160
Platelets: 215
RBC: 4.16 — ABNORMAL LOW
RDW: 13.7
RDW: 14
WBC: 8

## 2011-07-29 LAB — PROTIME-INR
Prothrombin Time: 14.6
Prothrombin Time: 27.5 — ABNORMAL HIGH

## 2011-07-29 LAB — TYPE AND SCREEN: ABO/RH(D): B POS

## 2011-07-30 LAB — CBC
MCHC: 34.3
Platelets: 240
RDW: 14.2 — ABNORMAL HIGH

## 2011-07-30 LAB — COMPREHENSIVE METABOLIC PANEL
ALT: 23
AST: 35
Calcium: 9
Creatinine, Ser: 0.85
GFR calc Af Amer: >60
Sodium: 137
Total Protein: 6.3

## 2011-07-30 LAB — URINALYSIS, ROUTINE W REFLEX MICROSCOPIC
Glucose, UA: NEGATIVE
Hgb urine dipstick: NEGATIVE
Specific Gravity, Urine: 1.01
pH: 6

## 2011-08-13 ENCOUNTER — Other Ambulatory Visit (HOSPITAL_COMMUNITY): Payer: Self-pay | Admitting: Family Medicine

## 2011-08-13 ENCOUNTER — Ambulatory Visit (HOSPITAL_COMMUNITY)
Admission: RE | Admit: 2011-08-13 | Discharge: 2011-08-13 | Disposition: A | Discharge status: Home / Self Care | Payer: Medicare Other | Source: Ambulatory Visit | Attending: Family Medicine | Admitting: Family Medicine

## 2011-08-13 DIAGNOSIS — M79609 Pain in unspecified limb: Secondary | ICD-10-CM | POA: Insufficient documentation

## 2011-08-13 DIAGNOSIS — M7989 Other specified soft tissue disorders: Secondary | ICD-10-CM | POA: Insufficient documentation

## 2011-08-20 ENCOUNTER — Ambulatory Visit (HOSPITAL_COMMUNITY): Admission: RE | Admit: 2011-08-20 | Payer: Medicare Other | Source: Ambulatory Visit

## 2011-08-20 ENCOUNTER — Ambulatory Visit (HOSPITAL_COMMUNITY)
Admission: RE | Admit: 2011-08-20 | Discharge: 2011-08-20 | Disposition: A | Discharge status: Home / Self Care | Payer: 59 | Source: Ambulatory Visit | Attending: Family Medicine | Admitting: Family Medicine

## 2011-08-20 DIAGNOSIS — M79609 Pain in unspecified limb: Secondary | ICD-10-CM

## 2011-08-20 DIAGNOSIS — M7989 Other specified soft tissue disorders: Secondary | ICD-10-CM

## 2011-08-20 DIAGNOSIS — R52 Pain, unspecified: Secondary | ICD-10-CM

## 2011-08-20 NOTE — Progress Notes (Signed)
*  PRELIMINARY RESULTS* No evidence of left lower extremity DVT, superficial thrombosis, or Baker's cyst  Dale Morrow, [place] D 08/20/2011, 1:10 PM

## 2011-12-16 ENCOUNTER — Other Ambulatory Visit (HOSPITAL_COMMUNITY): Payer: Self-pay | Admitting: Urology

## 2011-12-16 DIAGNOSIS — Z0189 Encounter for other specified special examinations: Secondary | ICD-10-CM

## 2011-12-19 ENCOUNTER — Ambulatory Visit (HOSPITAL_COMMUNITY)
Admission: RE | Admit: 2011-12-19 | Discharge: 2011-12-19 | Disposition: A | Discharge status: Home / Self Care | Payer: 59 | Source: Ambulatory Visit | Attending: Urology | Admitting: Urology

## 2011-12-19 DIAGNOSIS — Z79899 Other long term (current) drug therapy: Secondary | ICD-10-CM | POA: Insufficient documentation

## 2011-12-19 DIAGNOSIS — Z0189 Encounter for other specified special examinations: Secondary | ICD-10-CM

## 2011-12-19 DIAGNOSIS — Z13820 Encounter for screening for osteoporosis: Secondary | ICD-10-CM | POA: Insufficient documentation

## 2012-04-13 DIAGNOSIS — M654 Radial styloid tenosynovitis [de Quervain]: Secondary | ICD-10-CM | POA: Insufficient documentation

## 2012-04-13 HISTORY — DX: Radial styloid tenosynovitis (de quervain): M65.4

## 2012-04-17 ENCOUNTER — Encounter (HOSPITAL_BASED_OUTPATIENT_CLINIC_OR_DEPARTMENT_OTHER): Payer: Self-pay | Admitting: *Deleted

## 2012-04-17 NOTE — Pre-Procedure Instructions (Signed)
To come for BMET and EKG 

## 2012-04-20 ENCOUNTER — Encounter (HOSPITAL_BASED_OUTPATIENT_CLINIC_OR_DEPARTMENT_OTHER)
Admission: RE | Admit: 2012-04-20 | Discharge: 2012-04-20 | Disposition: A | Discharge status: Home / Self Care | Payer: 59 | Source: Ambulatory Visit | Attending: Orthopedic Surgery | Admitting: Orthopedic Surgery

## 2012-04-20 LAB — BASIC METABOLIC PANEL
CO2: 23 mEq/L (ref 19–32)
Glucose, Bld: 312 mg/dL — ABNORMAL HIGH (ref 70–99)
Potassium: 4.4 mEq/L (ref 3.5–5.1)
Sodium: 130 mEq/L — ABNORMAL LOW (ref 135–145)

## 2012-04-20 NOTE — H&P (Signed)
Dale Morrow is an 71 y.o. male.   Chief Complaint: c/o pain radial aspect left wrist HPI: Favio returns for follow up evaluation of his left wrist. He is very swollen at the first dorsal compartment. Seng's diabetes is under moderate control. He states his last hemoglobin A1C was 7.0. He is under treatment for prostate cancer.     Past Medical History  Diagnosis Date  . Arthritis     left knee  . Depression   . De Quervain's tenosynovitis, left 04/2012  . Diabetes mellitus     Insulin pump  . Hypertension     under control; takes BP med. to protect kidneys due to DM  . Prostate cancer, primary, with metastasis from prostate to other site     metastasis to lymph nodes  . PONV (postoperative nausea and vomiting)     Past Surgical History  Procedure Date  . Hemorrhoid surgery 1970s  . Anterior cruciate ligament repair     right knee  . Wrist arthroscopy 05/03/2010    right; and release of 1st dorsal compartment  . Knee arthrotomy 03/18/2008    left; with scar exc.  [name] Knee arthroscopy 09/25/2007; 04/15/2005    left  . Knee joint manipulation 06/29/2007    left  . Total knee arthroplasty 04/29/2007    left    History reviewed. No pertinent family history. Social History:  reports that he has never smoked. He has never used smokeless tobacco. He reports that he does not drink alcohol or use illicit drugs.  Allergies:  Allergies  Allergen Reactions  . Bee Venom Anaphylaxis  . Shellfish Allergy Anaphylaxis    No prescriptions prior to admission    Results for orders placed during the hospital encounter of 04/21/12 (from the past 48 hour(s))  BASIC METABOLIC PANEL     Status: Abnormal   Collection Time   04/20/12  9:00 AM      Component Value Range Comment   Sodium 130 (*) 135 - 145 mEq/L    Potassium 4.4  3.5 - 5.1 mEq/L    Chloride 98  96 - 112 mEq/L    CO2 23  19 - 32 mEq/L    Glucose, Bld 312 (*) 70 - 99 mg/dL    BUN 15  6 - 23 mg/dL    Creatinine, Ser 1.61  0.50 -  1.35 mg/dL    Calcium 8.4  8.4 - 09.6 mg/dL    GFR calc non Af Amer >90  >90 mL/min    GFR calc Af Amer >90  >90 mL/min     No results found.   Pertinent items are noted in HPI.  Height 5\' 8"  (1.727 m), weight 83.915 kg (185 lb).  General appearance: alert Head: Normocephalic, without obvious abnormality Neck: supple, symmetrical, trachea midline Resp: clear to auscultation bilaterally Cardio: regular rate and rhythm GI: normal findings: bowel sounds normal Extremities:Clinical exam at this time reveals that he has pain with palpation over the left first dorsal compartment. He has a markedly painful Finkelstein's maneuver. He has not responded to injection and splinting.   Pulses: 2+ and symmetric Skin: normal Neurologic: Grossly normal    Assessment/Plan Impression: DeQuervain's STS left 1st dorsal compartment  Plan: To the OR for release left 1st dorsal compartment. The procedure, risks and post-op course were discussed with the patient at length and he was in agreement with the plan.  DASNOIT,Izyan Ezzell J 04/20/2012, 11:12 AM      H&P documentation: 04/21/2012  -History  and Physical Reviewed  -Patient has been re-examined  -No change in the plan of care  Wyn Forster, MD

## 2012-04-21 ENCOUNTER — Encounter (HOSPITAL_BASED_OUTPATIENT_CLINIC_OR_DEPARTMENT_OTHER): Payer: Self-pay | Admitting: Anesthesiology

## 2012-04-21 ENCOUNTER — Ambulatory Visit (HOSPITAL_BASED_OUTPATIENT_CLINIC_OR_DEPARTMENT_OTHER): Payer: 59 | Admitting: Anesthesiology

## 2012-04-21 ENCOUNTER — Ambulatory Visit (HOSPITAL_BASED_OUTPATIENT_CLINIC_OR_DEPARTMENT_OTHER)
Admission: RE | Admit: 2012-04-21 | Discharge: 2012-04-21 | Disposition: A | Discharge status: Home / Self Care | Payer: 59 | Source: Ambulatory Visit | Attending: Orthopedic Surgery | Admitting: Orthopedic Surgery

## 2012-04-21 ENCOUNTER — Encounter (HOSPITAL_BASED_OUTPATIENT_CLINIC_OR_DEPARTMENT_OTHER): Payer: Self-pay | Admitting: *Deleted

## 2012-04-21 ENCOUNTER — Encounter (HOSPITAL_BASED_OUTPATIENT_CLINIC_OR_DEPARTMENT_OTHER)
Admission: RE | Disposition: A | Discharge status: Home / Self Care | Payer: Self-pay | Source: Ambulatory Visit | Attending: Orthopedic Surgery

## 2012-04-21 DIAGNOSIS — M654 Radial styloid tenosynovitis [de Quervain]: Secondary | ICD-10-CM | POA: Insufficient documentation

## 2012-04-21 DIAGNOSIS — C61 Malignant neoplasm of prostate: Secondary | ICD-10-CM | POA: Insufficient documentation

## 2012-04-21 DIAGNOSIS — E119 Type 2 diabetes mellitus without complications: Secondary | ICD-10-CM | POA: Insufficient documentation

## 2012-04-21 DIAGNOSIS — F3289 Other specified depressive episodes: Secondary | ICD-10-CM | POA: Insufficient documentation

## 2012-04-21 DIAGNOSIS — Z01810 Encounter for preprocedural cardiovascular examination: Secondary | ICD-10-CM | POA: Insufficient documentation

## 2012-04-21 DIAGNOSIS — Z9641 Presence of insulin pump (external) (internal): Secondary | ICD-10-CM | POA: Insufficient documentation

## 2012-04-21 DIAGNOSIS — C779 Secondary and unspecified malignant neoplasm of lymph node, unspecified: Secondary | ICD-10-CM | POA: Insufficient documentation

## 2012-04-21 DIAGNOSIS — F329 Major depressive disorder, single episode, unspecified: Secondary | ICD-10-CM | POA: Insufficient documentation

## 2012-04-21 DIAGNOSIS — Z01812 Encounter for preprocedural laboratory examination: Secondary | ICD-10-CM | POA: Insufficient documentation

## 2012-04-21 DIAGNOSIS — I10 Essential (primary) hypertension: Secondary | ICD-10-CM | POA: Insufficient documentation

## 2012-04-21 HISTORY — DX: Other specified postprocedural states: Z98.890

## 2012-04-21 HISTORY — DX: Depression, unspecified: F32.A

## 2012-04-21 HISTORY — PX: DORSAL COMPARTMENT RELEASE: SHX5039

## 2012-04-21 HISTORY — DX: Major depressive disorder, single episode, unspecified: F32.9

## 2012-04-21 HISTORY — DX: Malignant neoplasm of prostate: C61

## 2012-04-21 HISTORY — DX: Radial styloid tenosynovitis (de quervain): M65.4

## 2012-04-21 HISTORY — DX: Nausea with vomiting, unspecified: R11.2

## 2012-04-21 HISTORY — DX: Unspecified osteoarthritis, unspecified site: M19.90

## 2012-04-21 LAB — GLUCOSE, CAPILLARY: Glucose-Capillary: 129 mg/dL — ABNORMAL HIGH (ref 70–99)

## 2012-04-21 SURGERY — RELEASE, FIRST DORSAL COMPARTMENT, HAND
Anesthesia: Monitor Anesthesia Care | Site: Wrist | Laterality: Left | Wound class: Clean

## 2012-04-21 MED ORDER — MIDAZOLAM HCL 5 MG/5ML IJ SOLN
INTRAMUSCULAR | Status: DC | PRN
  Administered 2012-04-21: 1 mg via INTRAVENOUS

## 2012-04-21 MED ORDER — METOCLOPRAMIDE HCL 5 MG/ML IJ SOLN: 10.0000 mg | Freq: Once | INTRAMUSCULAR | Status: DC | PRN

## 2012-04-21 MED ORDER — LACTATED RINGERS IV SOLN
INTRAVENOUS | Status: DC
  Administered 2012-04-21: 07:00:00 via INTRAVENOUS

## 2012-04-21 MED ORDER — OXYCODONE HCL 5 MG PO TABS
5.0000 mg | ORAL_TABLET | Freq: Once | ORAL | Status: AC | PRN
  Administered 2012-04-21: 5 mg via ORAL

## 2012-04-21 MED ORDER — LIDOCAINE HCL 2 % IJ SOLN
INTRAMUSCULAR | Status: DC | PRN
  Administered 2012-04-21: 2.5 mL via INTRADERMAL

## 2012-04-21 MED ORDER — FENTANYL CITRATE 0.05 MG/ML IJ SOLN
25.0000 ug | INTRAMUSCULAR | Status: DC | PRN
  Administered 2012-04-21: 0.25 ug via INTRAVENOUS
  Administered 2012-04-21: 50 ug via INTRAVENOUS

## 2012-04-21 MED ORDER — OXYCODONE-ACETAMINOPHEN 5-325 MG PO TABS: 1.0000 | ORAL_TABLET | ORAL | Status: AC | PRN

## 2012-04-21 MED ORDER — PROPOFOL 10 MG/ML IV EMUL
INTRAVENOUS | Status: DC | PRN
  Administered 2012-04-21: 70 mg via INTRAVENOUS

## 2012-04-21 MED ORDER — PROPOFOL 10 MG/ML IV EMUL
INTRAVENOUS | Status: DC | PRN
  Administered 2012-04-21: 100 ug/kg/min via INTRAVENOUS

## 2012-04-21 MED ORDER — ONDANSETRON HCL 4 MG/2ML IJ SOLN
INTRAMUSCULAR | Status: DC | PRN
  Administered 2012-04-21: 4 mg via INTRAVENOUS

## 2012-04-21 MED ORDER — FENTANYL CITRATE 0.05 MG/ML IJ SOLN
INTRAMUSCULAR | Status: DC | PRN
  Administered 2012-04-21: 50 ug via INTRAVENOUS

## 2012-04-21 SURGICAL SUPPLY — 38 items
BANDAGE ELASTIC 3 VELCRO ST LF (GAUZE/BANDAGES/DRESSINGS) ×2 IMPLANT
BLADE MINI RND TIP GREEN BEAV (BLADE) IMPLANT
BLADE SURG 15 STRL LF DISP TIS (BLADE) ×1 IMPLANT
BLADE SURG 15 STRL SS (BLADE) ×2
BNDG CMPR 9X4 STRL LF SNTH (GAUZE/BANDAGES/DRESSINGS) ×1
BNDG ESMARK 4X9 LF (GAUZE/BANDAGES/DRESSINGS) ×1 IMPLANT
BRUSH SCRUB EZ PLAIN DRY (MISCELLANEOUS) ×2 IMPLANT
CLOTH BEACON ORANGE TIMEOUT ST (SAFETY) ×2 IMPLANT
CORDS BIPOLAR (ELECTRODE) ×1 IMPLANT
COVER MAYO STAND STRL (DRAPES) ×2 IMPLANT
COVER TABLE BACK 60X90 (DRAPES) ×2 IMPLANT
CUFF TOURNIQUET SINGLE 18IN (TOURNIQUET CUFF) ×1 IMPLANT
DECANTER SPIKE VIAL GLASS SM (MISCELLANEOUS) ×2 IMPLANT
DRAPE EXTREMITY T 121X128X90 (DRAPE) ×2 IMPLANT
DRAPE SURG 17X23 STRL (DRAPES) ×2 IMPLANT
DRSG TEGADERM 4X4.75 (GAUZE/BANDAGES/DRESSINGS) ×1 IMPLANT
GLOVE BIOGEL M STRL SZ7.5 (GLOVE) ×2 IMPLANT
GLOVE ORTHO TXT STRL SZ7.5 (GLOVE) ×2 IMPLANT
GOWN PREVENTION PLUS XLARGE (GOWN DISPOSABLE) ×2 IMPLANT
GOWN PREVENTION PLUS XXLARGE (GOWN DISPOSABLE) ×2 IMPLANT
NEEDLE 27GAX1X1/2 (NEEDLE) ×1 IMPLANT
PACK BASIN DAY SURGERY FS (CUSTOM PROCEDURE TRAY) ×2 IMPLANT
PAD CAST 3X4 CTTN HI CHSV (CAST SUPPLIES) IMPLANT
PADDING CAST ABS 4INX4YD NS (CAST SUPPLIES)
PADDING CAST ABS COTTON 4X4 ST (CAST SUPPLIES) ×1 IMPLANT
PADDING CAST COTTON 3X4 STRL (CAST SUPPLIES) ×2
SLEEVE SCD COMPRESS KNEE MED (MISCELLANEOUS) IMPLANT
SPONGE GAUZE 4X4 12PLY (GAUZE/BANDAGES/DRESSINGS) ×2 IMPLANT
STOCKINETTE 4X48 STRL (DRAPES) ×2 IMPLANT
STRIP CLOSURE SKIN 1/2X4 (GAUZE/BANDAGES/DRESSINGS) ×2 IMPLANT
SUT PROLENE 3 0 PS 2 (SUTURE) ×2 IMPLANT
SUT VIC AB 4-0 P-3 18XBRD (SUTURE) IMPLANT
SUT VIC AB 4-0 P3 18 (SUTURE)
SYR 3ML 23GX1 SAFETY (SYRINGE) IMPLANT
SYR CONTROL 10ML LL (SYRINGE) ×1 IMPLANT
TRAY DSU PREP LF (CUSTOM PROCEDURE TRAY) ×2 IMPLANT
UNDERPAD 30X30 INCONTINENT (UNDERPADS AND DIAPERS) ×2 IMPLANT
WATER STERILE IRR 1000ML POUR (IV SOLUTION) IMPLANT

## 2012-04-21 NOTE — Anesthesia Postprocedure Evaluation (Signed)
Anesthesia Post Note  Patient: Dale Morrow  Procedure(s) Performed: Procedure(s) (LRB): RELEASE DORSAL COMPARTMENT (DEQUERVAIN) (Left)  Anesthesia type: MAC  Patient location: PACU  Post pain: Pain level controlled  Post assessment: Patient's Cardiovascular Status Stable  Last Vitals:  Filed Vitals:   04/21/12 0913  BP: 158/84  Pulse: 53  Temp: 36.7 C  Resp: 16    Post vital signs: Reviewed and stable  Level of consciousness: alert  Complications: No apparent anesthesia complications

## 2012-04-21 NOTE — Brief Op Note (Signed)
04/21/2012  7:59 AM  PATIENT:  Dale Morrow  71 y.o. male  PRE-OPERATIVE DIAGNOSIS:  dequervains left wrist  POST-OPERATIVE DIAGNOSIS:  dequervains left wrist  PROCEDURE:  Procedure(s) (LRB): RELEASE DORSAL COMPARTMENT (DEQUERVAIN) (Left)  SURGEON:  Surgeon(s) and Role:    * Wyn Forster., MD - Primary  PHYSICIAN ASSISTANT:   ASSISTANTS:Sarena Jezek Dasnoit,P.A-C   ANESTHESIA:   IV sedation  EBL:     BLOOD ADMINISTERED:none  DRAINS: none   LOCAL MEDICATIONS USED:  XYLOCAINE   SPECIMEN:  No Specimen  DISPOSITION OF SPECIMEN:  N/A  COUNTS:  YES  TOURNIQUET:  * Missing tourniquet times found for documented tourniquets in log:  [id number] *  DICTATION: .Other Dictation: Dictation Number [id number]  PLAN OF CARE: Discharge to home after PACU  PATIENT DISPOSITION:  PACU - hemodynamically stable.

## 2012-04-21 NOTE — Op Note (Signed)
[name], [name] NO.:  [id number]  MEDICAL RECORD NO.:  [id number]  LOCATION:                                 FACILITY:  PHYSICIAN:  Dale Morrow, M.D. DATE OF BIRTH:  1941/10/09  DATE OF PROCEDURE:  04/21/2012 DATE OF DISCHARGE:  04/21/2012                              OPERATIVE REPORT   PREOPERATIVE DIAGNOSIS:  Severe chronic first dorsal compartment stenosing tenosynovitis of left wrist.  POSTOPERATIVE DIAGNOSIS:  Severe chronic first dorsal compartment stenosing tenosynovitis of left wrist.  OPERATION:  Release of left first dorsal compartment with incidental synovectomy.  OPERATING SURGEON:  Dale Fitch. Jafari Mckillop, MD  ASSISTANT:  Dale Reeks Dasnoit, PA-C  ANESTHESIA:  IV sedation supplemented by 2% lidocaine, first dorsal compartment block.  SUPERVISING ANESTHESIOLOGIST:  Janetta Hora. Gelene Mink, MD.  INDICATIONS:  Dale Morrow is a 71 year old gentleman referred for evaluation and management of a painful left wrist.  We had previously diagnosed stenosing tenosynovitis of the left first dorsal compartment. He postponed treatment while he was diagnosed and underwent treatment for prostate cancer.  He subsequently has had persistent pain, swelling, and difficulty with wrist motion and thumb motion.  He returned requesting definitive treatment for this predicament.  We noted that he had a markedly swollen first dorsal compartment, markedly positive Finkelstein maneuver and tenosynovitis.  After informed consent, he is brought to the operating room at this time.  PROCEDURE:  Dale Morrow was brought to room #1 of the [facility] and placed in supine position upon the operating table.  Following IV sedation, the left arm and hand were prepped with Betadine soap and solution, sterilely draped.  A pneumatic tourniquet was applied to the proximal left brachium.  Following routine surgical time-out, the left arm was exsanguinated with an  Esmarch bandage and the arterial tourniquet was inflated to 220 mmHg. A 2.5 mL of 2% lidocaine had been infiltrated prior to prep leading to excellent anesthesia.  Procedure commenced with a short transverse incision directly over the palpably thickened first dorsal compartment.  Subcutaneous tissues were carefully divided revealing abundant inflammatory tissue.  The radial superficial sensory branches were identified and gently retracted away from the first dorsal compartment.  Ragnell retractors were placed.  The compartment was split with a wall thickness of more than 3 mm. There were 3 tendons within the compartment.  There were 2 slips of the abductor pollicis longus and single slip of the extensor pollicis brevis.  The musculotendinous junction of the abductor pollicis longus tendons was invested with very fibrotic and thick tenosynovium.  The wound was inspected for other anatomic predicaments and none were identified.  The skin was then repaired with intradermal 3-0 Prolene followed by Steri-Strips.  Sterile gauze dressing was applied with Tegaderm and Ace wrap.  There were no apparent complications.  For aftercare, Dale Morrow is provided a prescription for Percocet 5 mg 1 p.o. q.4 hours p.r.n. pain, #16 tablets without refill.  He will also use over-the-counter analgesics such as Tylenol.     Dale Fitch Mcdaniel Ohms, M.D.     RVS/MEDQ  D:  04/21/2012  T:  04/21/2012  Job:  [id number]  cc:  Carola J. Gerri Spore, M.D.

## 2012-04-21 NOTE — Transfer of Care (Signed)
Immediate Anesthesia Transfer of Care Note  Patient: Dale Morrow  Procedure(s) Performed: Procedure(s) (LRB): RELEASE DORSAL COMPARTMENT (DEQUERVAIN) (Left)  Patient Location: PACU  Anesthesia Type: MAC  Level of Consciousness: awake, alert  and oriented  Airway & Oxygen Therapy: Patient Spontanous Breathing  Post-op Assessment: Report given to PACU RN and Post -op Vital signs reviewed and stable  Post vital signs: Reviewed and stable  Complications: No apparent anesthesia complications

## 2012-04-21 NOTE — Anesthesia Preprocedure Evaluation (Signed)
Anesthesia Evaluation  Patient identified by MRN, date of birth, ID band Patient awake    Reviewed: Allergy & Precautions, H&P , NPO status , Patient's Chart, lab work & pertinent test results, reviewed documented beta blocker date and time   History of Anesthesia Complications (+) PONV  Airway Mallampati: II TM Distance: >3 FB Neck ROM: full    Dental   Pulmonary neg pulmonary ROS,          Cardiovascular hypertension, On Medications and On Home Beta Blockers     Neuro/Psych PSYCHIATRIC DISORDERS  Neuromuscular disease    GI/Hepatic negative GI ROS, Neg liver ROS,   Endo/Other  Insulin Dependent  Renal/GU negative Renal ROS  negative genitourinary   Musculoskeletal   Abdominal   Peds  Hematology negative hematology ROS (+)   Anesthesia Other Findings See surgeon's H&P   Reproductive/Obstetrics negative OB ROS                           Anesthesia Physical Anesthesia Plan  ASA: III  Anesthesia Plan: MAC   Post-op Pain Management:    Induction: Intravenous  Airway Management Planned: Simple Face Mask  Additional Equipment:   Intra-op Plan:   Post-operative Plan:   Informed Consent: I have reviewed the patients History and Physical, chart, labs and discussed the procedure including the risks, benefits and alternatives for the proposed anesthesia with the patient or authorized representative who has indicated his/her understanding and acceptance.   Dental Advisory Given  Plan Discussed with: CRNA and Surgeon  Anesthesia Plan Comments:         Anesthesia Quick Evaluation

## 2012-04-23 ENCOUNTER — Encounter (HOSPITAL_BASED_OUTPATIENT_CLINIC_OR_DEPARTMENT_OTHER): Payer: Self-pay | Admitting: Orthopedic Surgery

## 2012-09-28 ENCOUNTER — Ambulatory Visit: Payer: 59 | Admitting: *Deleted

## 2012-10-12 ENCOUNTER — Encounter: Payer: 59 | Attending: Internal Medicine | Admitting: *Deleted

## 2012-10-12 ENCOUNTER — Ambulatory Visit: Payer: 59 | Admitting: *Deleted

## 2012-10-12 ENCOUNTER — Encounter: Payer: Self-pay | Admitting: *Deleted

## 2012-10-12 DIAGNOSIS — Z713 Dietary counseling and surveillance: Secondary | ICD-10-CM | POA: Insufficient documentation

## 2012-10-12 DIAGNOSIS — E119 Type 2 diabetes mellitus without complications: Secondary | ICD-10-CM | POA: Insufficient documentation

## 2012-10-12 DIAGNOSIS — E669 Obesity, unspecified: Secondary | ICD-10-CM | POA: Insufficient documentation

## 2012-10-12 DIAGNOSIS — E678 Other specified hyperalimentation: Secondary | ICD-10-CM | POA: Insufficient documentation

## 2012-10-12 NOTE — Progress Notes (Signed)
  Medical Nutrition Therapy:  Appt start time: 1100 end time:  1230.  Assessment:  Primary concerns today: patient here today for diabetes education, weight management and hypervitaminosis. Patient has been on Medtronic insulin pump for many years. He does not use the Bolus Wizard stating it doesn't manage his BG accurately. He also states he is not comfortable with carb counting. He does SMBG 4-6 times a day and manually boluses for his BG and meals.  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 Also has [place] Ridgeback that is shown at professional dog shows so he travels quite a bit.  MEDICATIONS: see list   DIETARY INTAKE:  Usual eating pattern includes 3 meals and 0-2 snacks per day.  Everyday foods include good variety of all food groups.  Avoided foods include high carbohydrate foods.    24-hr recall:  B ( AM): Cheerios with skim milk, banana small, a few walnuts, 3 c coffee with Spenda  Snk ( AM): OJ if BG in 70's after gym  L ( PM): spinach salad with cheese, walnuts, jalapeno peppers and balsamic vinegar, water to drink Snk ( PM): hungry a lot, tries not to eat though D ( PM): self cooks usually, wife cooks 2nd shift. Meat, vegetables, spinach salad, (no starch) Snk ( PM): occasionally a small glass of skim milk OR SF ice cream Beverages: water and occasionally diet Coke  Usual physical activity: goes to gym every day of the week when not traveling.   Estimated energy needs: 1600 calories 180 g carbohydrates 120 g protein 44 g fat  Progress Towards Goal(s):  In progress.   Nutritional Diagnosis:  NI-1.5 Excessive energy intake As related to activity level.  As evidenced by BMI of 29.7.    Intervention:  Nutrition counseling and diabetes education for insulin pump initiated. I uploaded his pump to [facility] and reviewed the reports with patient. Patient familiar with  basic physiology of diabetes, he is SMBG checking BG at alternate times of day, and understands A1c. I reviewed Carb Counting in terms of food groups. Also explained value of using the Bolus Wizard to get more consistent results from insulin doses and he requested that the Bolus Wizard be set up, which we did. Also increased his Max Bolus up to 15 units.  Plan: Aim for 3-4 Carb Choices (45-60 grams) per meal Aim for 0-2 Carbs per snack if hungry Consider using your Bolus Wizard on your insulin pump to provide more objective decisions about boluses Settings we discussed:  Target: 100-120 mg/dl  Carb Ratio in Exchanges: 2 units/Exchange otherwise known as Carb Choice  Sensitivity Factor: 1 unit per 15 mg/dl above your target  Active Insulin: 4 hours Consider increasing your Max Bolus from 10 units to 15 units to accommodate your true meal time insulin needs  Handouts given during visit include:  [facility] Reports Carb Counting and Food Label handouts Meal Plan Card  Monitoring/Evaluation:  Dietary intake, exercise, use of Bolus Wizard, and body weight in 3 month(s). He sees his MD tomorrow, Gilman Buttner with [facility] in February so we discussed he follow up with me in March.

## 2012-10-12 NOTE — Patient Instructions (Addendum)
Plan: Aim for 3-4 Carb Choices (45-60 grams) per meal Aim for 0-2 Carbs per snack if hungry Consider using your Bolus Wizard on your insulin pump to provide more objective decisions about boluses Settings we discussed:  Target: 100-120 mg/dl  Carb Ratio in Exchanges: 2 units/Exchange otherwise known as Carb Choice  Sensitivity Factor: 1 unit per 15 mg/dl above your target  Active Insulin: 4 hours Consider increasing your Max Bolus from 10 units to 15 units to accommodate your true meal time insulin needs

## 2012-12-29 ENCOUNTER — Ambulatory Visit (INDEPENDENT_AMBULATORY_CARE_PROVIDER_SITE_OTHER): Payer: Self-pay | Admitting: Family Medicine

## 2012-12-29 DIAGNOSIS — E119 Type 2 diabetes mellitus without complications: Secondary | ICD-10-CM

## 2012-12-29 NOTE — Progress Notes (Signed)
Patient presents to pharmacy for 3 month follow up of DM as part of the employee sponsored Link to [employer]. Medications and insulin regimen have been reviewed. I have also discussed with patient lifestyle interventions such as diet and exercise. Full documentation of this visit can be found in the [employer] documenting system through [facility] [employer] [facility]). Patient has set a series of personal goals and will follow up in 3 months with his regular care manager for further review of DM.

## 2013-01-04 ENCOUNTER — Ambulatory Visit: Payer: 59 | Admitting: *Deleted

## 2013-01-11 ENCOUNTER — Encounter: Payer: 59 | Attending: Internal Medicine | Admitting: *Deleted

## 2013-01-11 DIAGNOSIS — E119 Type 2 diabetes mellitus without complications: Secondary | ICD-10-CM | POA: Insufficient documentation

## 2013-01-11 DIAGNOSIS — Z713 Dietary counseling and surveillance: Secondary | ICD-10-CM | POA: Insufficient documentation

## 2013-01-11 NOTE — Progress Notes (Signed)
  Medical Nutrition Therapy:  Appt start time: 1500 end time:  1530.  Assessment:  Primary concerns today: patient here today for diabetes education, weight management and hypervitaminosis follow up visit. Uploaded his pump to [facility] and printed reports for review with patient. He states he is comfortable with Carb Counting now and weight loss of 1.5 pounds noted. He continues to SMBG about 6 times each day although eating habits vary due to his heavy travel schedule with his dogs and other activities. No incidences of hypoglycemia reported lately.  MEDICATIONS: see list   DIETARY INTAKE:  Usual eating pattern includes 3 meals and 0-2 snacks per day.  Everyday foods include good variety of all food groups.  Avoided foods include high carbohydrate foods.    24-hr recall:  B ( AM): Cheerios with skim milk, banana small, a few walnuts, 3 c coffee with Spenda  Snk ( AM): OJ if BG in 70's after gym  L ( PM): spinach salad with cheese, walnuts, jalapeno peppers and balsamic vinegar, water to drink Snk ( PM): hungry a lot, tries not to eat though D ( PM): self cooks usually, wife cooks 2nd shift. Meat, vegetables, spinach salad, (no starch) Snk ( PM): occasionally a small glass of skim milk OR SF ice cream Beverages: water and occasionally diet Coke  Usual physical activity: goes to gym every day of the week when not traveling.   Reviewed blood glucose logs on 01/11/2013 via: [facility] and found the following:            Hypoglycemia Hyperglycemia Comments  Overnight Period:   YES Basal Rate  Pre-Meal:    Breakfast      Lunch      Supper     Post-Meal: Breakfast  YES Carb Ratio   Lunch      Supper     Bedtime:       Comments: FBG are running higher than bedtime BGs so discussed rationale of increasing basal rate @ MN from 0.900 tp 0.95 u/hr which is a 5% increase. Also observed rise in BG post breakfast by an average of 100 mg/dl and the benefit of increasing Carb ratio at breakfast from  7.5 to 6.5. Patient chose to make both changes on his pump while he was here. When patient is physically active and going to the gym, he overrides the Bolus Wizard recommendation and reduces the bolus by 50%, which he states works well for him.   Pump Settings:changes made are under date of visit and in bold print Date: Current Date: 01/11/2013   Basal Rate: Carb Ratio Sensitivity  Basal Rate: Carb Ratio Sensitivity   MN: 0.90 MN: 7.5 MN: 15 MN: 0.95 (+) MN: 6.5 (+) MN: 15  6 AM 0.75   6 AM 0.75 11:30: 7.5   5 PM 0.90   5 PM 0.90                                         Plan: continue using Bolus Wizard as discussed   Follow up: Patient to provide additional BG data via [facility] at next visit in 2 months for further review

## 2013-01-12 NOTE — Progress Notes (Signed)
Patient ID: Dale Morrow, male   DOB: 04-04-1941, 72 y.o.   MRN: [id number] ATTENDING PHYSICIAN NOTE: I have reviewed the chart and agree with the plan as detailed above. Denny Levy MD Pager [phone]

## 2013-01-13 ENCOUNTER — Encounter: Payer: Self-pay | Admitting: Neurology

## 2013-01-13 ENCOUNTER — Ambulatory Visit (INDEPENDENT_AMBULATORY_CARE_PROVIDER_SITE_OTHER): Payer: 59 | Admitting: Neurology

## 2013-01-13 VITALS — Wt 188.0 lb

## 2013-01-13 DIAGNOSIS — G5139 Clonic hemifacial spasm, unspecified: Secondary | ICD-10-CM

## 2013-01-13 DIAGNOSIS — C61 Malignant neoplasm of prostate: Secondary | ICD-10-CM | POA: Insufficient documentation

## 2013-01-13 DIAGNOSIS — G518 Other disorders of facial nerve: Secondary | ICD-10-CM

## 2013-01-13 DIAGNOSIS — M129 Arthropathy, unspecified: Secondary | ICD-10-CM

## 2013-01-13 DIAGNOSIS — M654 Radial styloid tenosynovitis [de Quervain]: Secondary | ICD-10-CM

## 2013-01-13 DIAGNOSIS — M1990 Unspecified osteoarthritis, unspecified site: Secondary | ICD-10-CM | POA: Insufficient documentation

## 2013-01-13 DIAGNOSIS — F329 Major depressive disorder, single episode, unspecified: Secondary | ICD-10-CM

## 2013-01-13 DIAGNOSIS — C801 Malignant (primary) neoplasm, unspecified: Secondary | ICD-10-CM

## 2013-01-13 DIAGNOSIS — I10 Essential (primary) hypertension: Secondary | ICD-10-CM

## 2013-01-13 DIAGNOSIS — R112 Nausea with vomiting, unspecified: Secondary | ICD-10-CM

## 2013-01-13 MED ORDER — INCOBOTULINUMTOXINA 50 UNITS IM SOLR
50.0000 [IU] | Freq: Once | INTRAMUSCULAR | Status: AC
  Administered 2013-01-13: 50 [IU] via INTRAMUSCULAR

## 2013-01-13 NOTE — Progress Notes (Signed)
History of Present Illness  HPI: Dale Morrow is a 72 years old right-handed Caucasian male, accompanied by his wife, referred by primary care physician for evaluation of tremor   He has family history of tremor, mother at her old age,  his sister, maternal uncle all had bilateral hand tremor, he also has past medical history of hypertension diabetes, insulin pump, hyperlipidemia, prostate cancer, status post radiation therapy. He  has a history of bilateral Bell's palsy, with residual left upper and lower face weakness  He noticed bilateral hand shaking over the past 6 years, difficulty writing, holding utensils, recent 6 months, he also noticed voice change, there was a tremorous quality to it  He denies loss sense of smell, and there was no REM sleep disorder, no gait difficulty. TSH was normal. He also has mild tremorish voice.  He was placed on Propranolol   in 2013, with moderate response, with bp at low normal side 100/66, myosin 50mg  qhs was added on Nov /2013, He reported mild  improvement,   Feb 25th 2014: He continued to have bilateral hands tremor, difficulty writing, difficulty holding a pencil, difficulty using utensil, he also has a years history of left facial spasm, mainly around his left eye, to the point of closing his left eye sometimes      Review of Systems  Out of a complete 14 system review, the patient complains of only the following symptoms, and all other reviewed systems are negative.  Constitutional: Weight gain   Hematology/Lymphatic: Easy bruising   Neurological: Tremor   Musculoskeletal: Joint pain   Joint swelling   Psychiatric: Depression       Social History  Patient lives at home with wife Dale Morrow and is retired. Patient has two years of college education.Patient quit tobacco in 1980 and alcohol. Patient denies any history of illicit drugs drinks 2-3 cups of caffeine daily. Inhaled Tobacco Use: former smoker  Family History  Patients parents are  both deceased. Diabetes Heart problems  Past Medical History  Cancer Diabetes, essential tremor  Surgical History  [place] Left knee Right ACL Left tendon repair    Vitals  Height: 67 inches Weight: 190 pounds  86.36 kilograms BMI: 29.87 BP: 119/ 70 in the right arm  Heart Rate: 65  Previous Weight: 190 (08/20/2012 2:49:14 PM)  Previous Height: 67 (08/20/2012 2:49:14 PM)   Initial vital signs entered by: guilford [email or web address],  December 08, 2012 1:31 PM      Physical Exam  Neck: supple no carotid bruits Respiratory: clear to auscultation bilaterally Cardiovascular: regular rate rhythm  Neurologic Exam  Mental Status: pleasant, awake, alert, cooperative to history, talking, and casual conversation. MILD TREMORISH VOICE Cranial Nerves: CN II-XII pupils were equal round reactive to light.  Fundi were sharp bilaterally.  Extraocular movements were full.  Visual fields were full on confrontational test.  Facial sensation  were normal. LEFT UPPER AND LOWER FACE WEAKNESS. Frequent left orbicularis oculi muscle twitching.  Hearing was intact to finger rubbing bilaterally.  Uvula tongue were midline.  Head turning and shoulder shrugging were normal and symmetric.  Tongue protrusion into the cheeks strength were normal.  Motor: Normal tone, bulk, and strength.Mod  essential tremor noted right > left. Sensory: Normal to light touch, pinprick, proprioception, and vibratory sensation. Coordination: Normal finger-to-nose, heel-to-shin.  There was no dysmetria noticed. Gait and Station: Narrow based and steady, was able to perform tiptoe, heel, and tandem walking without difficulty.   Reflexes: Deep tendon reflexes: Biceps: 2/2, Brachioradialis:  2/2, Triceps: 2/2, Pateller: 2/2, Achilles: 2/2.  Plantar responses are flexor.   Assessment and Plan:  72 Years old gentleman with history of essential tremor, previous left Bell's palsy, now with presenting left hemifacial spasm, he came in today for EMG  guided xeomin injection for his left hemifacial spasm. 50 units was dissolved into 1 cc of normal saline, Lot  member 1 5 7 5 9  8, expiration August 2014  Under EMG guidance, 20 units was used, discarded 30 units,  Injection was placed around the left orbicularis oris, at 2, 3, 4, 6, 8 oclock, 2 point 5 units at each injection site a total of 12 point 5 units.  Left corrugate 2.5 Right corrugate 2.5 Procerus 2.5 units  He is to continue to take primadone 50 mg 3 times a day for his essential tremor, return to clinic in 3 months for repeat injection

## 2013-01-14 ENCOUNTER — Encounter: Payer: Self-pay | Admitting: *Deleted

## 2013-02-23 ENCOUNTER — Ambulatory Visit: Payer: 59 | Admitting: *Deleted

## 2013-02-26 ENCOUNTER — Telehealth: Payer: Self-pay | Admitting: Neurology

## 2013-02-26 NOTE — Telephone Encounter (Signed)
Dr.Yan

## 2013-02-26 NOTE — Telephone Encounter (Signed)
Called and left message for patient stating that I will send message to Dr.Yan about Priamadone 50mg  tid. Left message give 24 hours for call back. RX not helping.

## 2013-03-01 ENCOUNTER — Telehealth: Payer: Self-pay | Admitting: Neurology

## 2013-03-01 NOTE — Telephone Encounter (Signed)
I have called him, botox injection has helped, he is taking primodone 50mg  tid, I advise him to increase it gradually to 2 tabs tid.

## 2013-03-05 NOTE — Telephone Encounter (Signed)
done

## 2013-03-09 ENCOUNTER — Other Ambulatory Visit: Payer: Self-pay | Admitting: Neurology

## 2013-03-17 ENCOUNTER — Encounter: Payer: 59 | Attending: Internal Medicine | Admitting: *Deleted

## 2013-03-17 DIAGNOSIS — E119 Type 2 diabetes mellitus without complications: Secondary | ICD-10-CM | POA: Insufficient documentation

## 2013-03-17 DIAGNOSIS — Z713 Dietary counseling and surveillance: Secondary | ICD-10-CM | POA: Insufficient documentation

## 2013-03-17 NOTE — Patient Instructions (Signed)
Plan: Increased basal rate at 3 AM to improve FBG range Consider how to change out your site when not needing to change out reservoir as we discussed Doing really well, keep up the good work!

## 2013-03-17 NOTE — Progress Notes (Signed)
  Pump Follow Up Progress Note  Orders received from MD giving me permission to make insulin pump adjustments for the following patient. Dale, Morrow Male, 72 y.o., October 09, 1941  Reviewed blood glucose logs on 03/17/2013 via: [facility] and found the following:            Hypoglycemia Hyperglycemia Comments  Overnight Period:      Pre-Meal:    Breakfast  YES  Basal   Lunch      Supper     Post-Meal: Breakfast      Lunch      Supper     Bedtime:       Comments: Post breakfast BGs markedly improved after increasing AM Carb ratio at last visit in March. His FBG are still too high so plan to add a basal rate at 3 AM - 6 AM that is a 5% increase to help bring these into target range. Also discussed how to change out a site when reservoir does not need to be changed yet so insulin is not wasted. Pt states only hypoglycemia is occuring when there is an increase in his activity that he doesn't plan for. Otherwise doing very well.  He has received an upgraded Medtronic pump ([id number])which he was trained on by Cristy Folks in [place]. New pump was updated into [facility] so data could be downloaded.  Pump Settings: Date: Current Date: 03/17/2013 changes are in [facility] Print   Basal Rate: Carb Ratio Sensitivity  Basal Rate: Carb Ratio Sensitivity   MN: 0.950 MN: 6.5 MN: 15 MN: 0.950 MN: 6.5 MN: 15  6 AM 0.750 11 AM: 7.5 11 AM: 30 3AM 1.00  (+) 11 AM: 7.5 11 AM: 30  5 PM 0.900   6 AM 0.750        5 PM 0.900                                Plan: patient to continue using pump as directed and follow up in 3 months

## 2013-04-13 ENCOUNTER — Other Ambulatory Visit (HOSPITAL_COMMUNITY): Payer: Self-pay | Admitting: Family Medicine

## 2013-04-13 DIAGNOSIS — R109 Unspecified abdominal pain: Secondary | ICD-10-CM

## 2013-04-14 ENCOUNTER — Ambulatory Visit (HOSPITAL_COMMUNITY)
Admission: RE | Admit: 2013-04-14 | Discharge: 2013-04-14 | Disposition: A | Discharge status: Home / Self Care | Payer: 59 | Source: Ambulatory Visit | Attending: Family Medicine | Admitting: Family Medicine

## 2013-04-14 ENCOUNTER — Ambulatory Visit (INDEPENDENT_AMBULATORY_CARE_PROVIDER_SITE_OTHER): Payer: 59 | Admitting: Neurology

## 2013-04-14 VITALS — BP 88/52 | HR 59 | Ht 67.0 in | Wt 194.0 lb

## 2013-04-14 DIAGNOSIS — K5900 Constipation, unspecified: Secondary | ICD-10-CM | POA: Insufficient documentation

## 2013-04-14 DIAGNOSIS — K449 Diaphragmatic hernia without obstruction or gangrene: Secondary | ICD-10-CM | POA: Insufficient documentation

## 2013-04-14 DIAGNOSIS — K4020 Bilateral inguinal hernia, without obstruction or gangrene, not specified as recurrent: Secondary | ICD-10-CM | POA: Insufficient documentation

## 2013-04-14 DIAGNOSIS — I700 Atherosclerosis of aorta: Secondary | ICD-10-CM | POA: Insufficient documentation

## 2013-04-14 DIAGNOSIS — Z923 Personal history of irradiation: Secondary | ICD-10-CM | POA: Insufficient documentation

## 2013-04-14 DIAGNOSIS — G5139 Clonic hemifacial spasm, unspecified: Secondary | ICD-10-CM | POA: Insufficient documentation

## 2013-04-14 DIAGNOSIS — R1084 Generalized abdominal pain: Secondary | ICD-10-CM | POA: Insufficient documentation

## 2013-04-14 DIAGNOSIS — Z8546 Personal history of malignant neoplasm of prostate: Secondary | ICD-10-CM | POA: Insufficient documentation

## 2013-04-14 DIAGNOSIS — G518 Other disorders of facial nerve: Secondary | ICD-10-CM

## 2013-04-14 DIAGNOSIS — R109 Unspecified abdominal pain: Secondary | ICD-10-CM

## 2013-04-14 MED ORDER — IOHEXOL 300 MG/ML  SOLN
100.0000 mL | Freq: Once | INTRAMUSCULAR | Status: AC | PRN
  Administered 2013-04-14: 100 mL via INTRAVENOUS

## 2013-04-14 MED ORDER — ESCITALOPRAM OXALATE 20 MG PO TABS: 20.0000 mg | ORAL_TABLET | Freq: Every day | ORAL | Status: DC

## 2013-04-14 MED ORDER — ESCITALOPRAM OXALATE 10 MG PO TABS: 20.0000 mg | ORAL_TABLET | Freq: Every day | ORAL | Status: DC

## 2013-04-14 MED ORDER — INCOBOTULINUMTOXINA 50 UNITS IM SOLR: 50.0000 [IU] | Freq: Once | INTRAMUSCULAR | Status: DC

## 2013-04-14 NOTE — Progress Notes (Signed)
  History of Present Illness:   [name] is a 72 years old right-handed Caucasian male, accompanied by his wife, referred by primary care physician for evaluation of tremor   He has family history of tremor, mother at her old age,  his sister, maternal uncle all had bilateral hand tremor, he also has past medical history of hypertension diabetes, insulin pump, hyperlipidemia, prostate cancer, status post radiation therapy. He  has a history of bilateral Bell's palsy, with residual left upper and lower face weakness  He noticed bilateral hand shaking over the past 6 years, difficulty writing, holding utensils, recent 6 months, he also noticed voice change, there was a tremorous quality to it  He denies loss sense of smell, and there was no REM sleep disorder, no gait difficulty. TSH was normal. He also has mild tremorish voice.  He was placed on Propranolol   in 2013, with moderate response, with bp at low normal side 100/66, myosin 50mg  qhs was added on Nov /2013, He reported mild  improvement,   His bilateral hands tremor, difficulty writing, difficulty holding a pencil, difficulty using utensil, he also has a years history of left facial spasm, mainly around his left eye, to the point of closing his left eye sometimes, his bilateral hands tremor has much improved with higher dose primidone 50 mg 2 tablets 3 times a day   UPDATE July 2nd 2014: He is  doing very well with last xeomin injection in April 2nd 2014, he received 20 units total, he barely noticed any significant side effect.    Physical Exam  Neck: supple no carotid bruits Respiratory: clear to auscultation bilaterally Cardiovascular: regular rate rhythm  Neurologic Exam  Mental Status: pleasant, awake, alert, cooperative to history, talking, and casual conversation. MILD TREMORISH VOICE Cranial Nerves: CN II-XII pupils were equal round reactive to light.  Fundi were sharp bilaterally.  Extraocular movements were full.  Visual  fields were full on confrontational test.  Facial sensation  were normal. LEFT UPPER AND LOWER FACE WEAKNESS. Frequent left orbicularis oculi muscle twitching.  Hearing was intact to finger rubbing bilaterally.  Uvula tongue were midline.  Head turning and shoulder shrugging were normal and symmetric.  Tongue protrusion into the cheeks strength were normal.  Motor: Normal tone, bulk, and strength.Mod  essential tremor noted right > left. Sensory: Normal to light touch, pinprick, proprioception, and vibratory sensation. Coordination: Normal finger-to-nose, heel-to-shin.  There was no dysmetria noticed. Gait and Station: Narrow based and steady, was able to perform tiptoe, heel, and tandem walking without difficulty.   Reflexes: Deep tendon reflexes: Biceps: 2/2, Brachioradialis: 2/2, Triceps: 2/2, Pateller: 2/2, Achilles: 2/2.  Plantar responses are flexor.   Assessment and Plan:  72 Years old gentleman with history of essential tremor, previous left Bell's palsy, now with presenting left hemifacial spasm, he came in today for EMG guided xeomin injection for his left hemifacial spasm. 50 units was dissolved into 1 cc of normal saline, Lot [id number], expiration March 2015  Under EMG guidance, 32.5 units was used, discarded 17.5 units,  Injection was placed around the left orbicularis oris, at 2, 3, 4, 6, 8 oclock, 2.5 units at each injection site a total of 12.5 units.  Left frontalis 2.5 x2=5  Right corrugate 5 Left platysmus 5x2=10  He is to continue to take primadone 50 mg 3 times a day for his essential tremor, return to clinic in 3 months for repeat injection

## 2013-04-15 ENCOUNTER — Other Ambulatory Visit: Payer: Self-pay | Admitting: Radiation Oncology

## 2013-04-30 ENCOUNTER — Emergency Department (HOSPITAL_COMMUNITY): Payer: 59

## 2013-04-30 ENCOUNTER — Encounter (HOSPITAL_COMMUNITY): Payer: Self-pay | Admitting: *Deleted

## 2013-04-30 ENCOUNTER — Emergency Department (HOSPITAL_COMMUNITY)
Admission: EM | Admit: 2013-04-30 | Discharge: 2013-04-30 | Disposition: A | Discharge status: Home / Self Care | Payer: 59 | Attending: Emergency Medicine | Admitting: Emergency Medicine

## 2013-04-30 DIAGNOSIS — Z8739 Personal history of other diseases of the musculoskeletal system and connective tissue: Secondary | ICD-10-CM | POA: Insufficient documentation

## 2013-04-30 DIAGNOSIS — E119 Type 2 diabetes mellitus without complications: Secondary | ICD-10-CM | POA: Insufficient documentation

## 2013-04-30 DIAGNOSIS — F3289 Other specified depressive episodes: Secondary | ICD-10-CM | POA: Insufficient documentation

## 2013-04-30 DIAGNOSIS — Z87898 Personal history of other specified conditions: Secondary | ICD-10-CM | POA: Insufficient documentation

## 2013-04-30 DIAGNOSIS — S62607B Fracture of unspecified phalanx of left little finger, initial encounter for open fracture: Secondary | ICD-10-CM

## 2013-04-30 DIAGNOSIS — M1710 Unilateral primary osteoarthritis, unspecified knee: Secondary | ICD-10-CM | POA: Insufficient documentation

## 2013-04-30 DIAGNOSIS — I10 Essential (primary) hypertension: Secondary | ICD-10-CM | POA: Insufficient documentation

## 2013-04-30 DIAGNOSIS — S62639B Displaced fracture of distal phalanx of unspecified finger, initial encounter for open fracture: Secondary | ICD-10-CM | POA: Insufficient documentation

## 2013-04-30 DIAGNOSIS — Y9389 Activity, other specified: Secondary | ICD-10-CM | POA: Insufficient documentation

## 2013-04-30 DIAGNOSIS — Z794 Long term (current) use of insulin: Secondary | ICD-10-CM | POA: Insufficient documentation

## 2013-04-30 DIAGNOSIS — F329 Major depressive disorder, single episode, unspecified: Secondary | ICD-10-CM | POA: Insufficient documentation

## 2013-04-30 DIAGNOSIS — IMO0002 Reserved for concepts with insufficient information to code with codable children: Secondary | ICD-10-CM | POA: Insufficient documentation

## 2013-04-30 DIAGNOSIS — Z8546 Personal history of malignant neoplasm of prostate: Secondary | ICD-10-CM | POA: Insufficient documentation

## 2013-04-30 DIAGNOSIS — Y929 Unspecified place or not applicable: Secondary | ICD-10-CM | POA: Insufficient documentation

## 2013-04-30 DIAGNOSIS — Z79899 Other long term (current) drug therapy: Secondary | ICD-10-CM | POA: Insufficient documentation

## 2013-04-30 DIAGNOSIS — Z87891 Personal history of nicotine dependence: Secondary | ICD-10-CM | POA: Insufficient documentation

## 2013-04-30 MED ORDER — OXYCODONE-ACETAMINOPHEN 5-325 MG PO TABS: 1.0000 | ORAL_TABLET | ORAL | Status: DC | PRN

## 2013-04-30 MED ORDER — CLINDAMYCIN HCL 150 MG PO CAPS: 300.0000 mg | ORAL_CAPSULE | Freq: Three times a day (TID) | ORAL | Status: DC

## 2013-04-30 NOTE — ED Notes (Addendum)
Pt reports he his his left pinkie nail with a hammer. Skin/ tissue sticking out end of the nail. 10/10. Pt unable to bend distal joint. sensation intact. Pain/numbness only in pinkie finger.   Pt has prostate cancer that has spread to lymph nodes, currently on lupron. Pt has DM, has insulin pump.

## 2013-04-30 NOTE — ED Provider Notes (Signed)
History    CSN: [id number] Arrival date & time 04/30/13  1039  First MD Initiated Contact with Patient 04/30/13 1122     Chief Complaint  Patient presents with  . finger laceration    (Consider location/radiation/quality/duration/timing/severity/associated sxs/prior Treatment) Patient is a 72 y.o. male presenting with hand pain.  Hand Pain This is a new problem. The current episode started today. The problem occurs constantly. The problem has been unchanged. Pertinent negatives include no abdominal pain, anorexia, arthralgias, change in bowel habit, chest pain, chills, congestion, coughing, diaphoresis, fatigue, fever, headaches, joint swelling, myalgias, nausea, neck pain, numbness, rash, sore throat, swollen glands, urinary symptoms, vertigo, visual change, vomiting or weakness. The symptoms are aggravated by bending. He has tried nothing for the symptoms. The treatment provided no relief.    Dale Morrow is a(n) 72 y.o. male who presents with complaint of injury to left fifth digit.  The patient states he was using a hammer this morning when he hit his left pinky. Injury occurred 2 hours ago. He complains of 10 out of 10 pain.  He has pain in the distal joint with ecchymosis and swelling.  He he is some noted deformity.  The patient has a history of insulin-dependent diabetes mellitus.  He does have a history of metastatic prostate cancer. Patient use of any anticoagulants.  Past Medical History  Diagnosis Date  . Arthritis     left knee  . Depression   . De Quervain's tenosynovitis, left 04/2012  . Diabetes mellitus     Insulin pump  . Hypertension     under control; takes BP med. to protect kidneys due to DM  . Prostate cancer, primary, with metastasis from prostate to other site     metastasis to lymph nodes  . PONV (postoperative nausea and vomiting)    Past Surgical History  Procedure Laterality Date  . Hemorrhoid surgery  1970s  . Anterior cruciate ligament repair       right knee  . Wrist arthroscopy  05/03/2010    right; and release of 1st dorsal compartment  . Knee arthrotomy  03/18/2008    left; with scar exc.  [name] Knee arthroscopy  09/25/2007; 04/15/2005    left  . Knee joint manipulation  06/29/2007    left  . Total knee arthroplasty  04/29/2007    left  . Dorsal compartment release  04/21/2012    Procedure: RELEASE DORSAL COMPARTMENT (DEQUERVAIN);  Surgeon: Wyn Forster., MD;  Location: [facility];  Service: Orthopedics;  Laterality: Left;  left 1st dorsal compartment release left wrist    Family History  Problem Relation Age of Onset  . Heart Problems Father    History  Substance Use Topics  . Smoking status: Former Smoker    Quit date: 10/13/1979  . Smokeless tobacco: Never Used  . Alcohol Use: No    Review of Systems  Constitutional: Negative for fever, chills, diaphoresis and fatigue.  HENT: Negative for congestion, sore throat and neck pain.   Respiratory: Negative for cough.   Cardiovascular: Negative for chest pain.  Gastrointestinal: Negative for nausea, vomiting, abdominal pain, anorexia and change in bowel habit.  Musculoskeletal: Negative for myalgias, joint swelling and arthralgias.  Skin: Negative for rash.  Neurological: Negative for vertigo, weakness, numbness and headaches.    Allergies  Bee venom and Shellfish allergy  Home Medications   Current Outpatient Rx  Name  Route  Sig  Dispense  Refill  . calcium carbonate (  OS-CAL - DOSED IN MG OF ELEMENTAL CALCIUM) 1250 MG tablet   Oral   Take 1 tablet by mouth 2 (two) times daily.         [name] denosumab (PROLIA) 60 MG/ML SOLN injection   Subcutaneous   Inject 60 mg into the skin every 6 (six) months. Administer in upper arm, thigh, or abdomen         . escitalopram (LEXAPRO) 10 MG tablet   Oral   Take 2 tablets (20 mg total) by mouth daily. One po qday xone week.   30 tablet   0   . escitalopram (LEXAPRO) 20 MG tablet   Oral   Take 1  tablet (20 mg total) by mouth daily.   90 tablet   3   . HYDROcodone-acetaminophen (NORCO/VICODIN) 5-325 MG per tablet   Oral   Take 1-2 tablets by mouth. Every 4 to 6 hours prn pain         . insulin lispro (HUMALOG) 100 UNIT/ML injection   Subcutaneous   Inject into the skin 3 (three) times daily before meals. Insulin pump         . Leuprolide Acetate, 6 Month, (LUPRON) 45 MG injection   Intramuscular   Inject 45 mg into the muscle every 6 (six) months.         [name] lisinopril (PRINIVIL,ZESTRIL) 10 MG tablet   Oral   Take 10 mg by mouth daily. AM         . megestrol (MEGACE) 20 MG tablet   Oral   Take 20 mg by mouth 2 (two) times daily.         . Multiple Vitamin (MULTIVITAMIN) tablet   Oral   Take 1 tablet by mouth daily.         . polyethylene glycol powder (GLYCOLAX/MIRALAX) powder   Oral   Take 17 g by mouth daily.         . primidone (MYSOLINE) 50 MG tablet   Oral   Take 2 tablets (100 mg total) by mouth 3 (three) times daily.   540 tablet   1     Patient was taking one TID, he was asked to [place] ...   . propranolol (INDERAL) 40 MG tablet      TAKE 1 TABLET BY MOUTH TWICE DAILY   180 tablet   1   . rosuvastatin (CRESTOR) 10 MG tablet   Oral   Take 10 mg by mouth daily. AM         . tamsulosin (FLOMAX) 0.4 MG CAPS      TAKE 1 CAPSULE BY MOUTH ONCE DAILY   90 capsule   3   . temazepam (RESTORIL) 30 MG capsule   Oral   Take 30 mg by mouth at bedtime as needed.          BP 143/76  Pulse 58  Temp(Src) 98 F (36.7 C) (Oral)  Resp 16  SpO2 100% Physical Exam Physical Exam  Nursing note and vitals reviewed. Constitutional: He appears well-developed and well-nourished. No distress.  HENT:  Head: Normocephalic and atraumatic.  Eyes: Conjunctivae normal are normal. No scleral icterus.  Neck: Normal range of motion. Neck supple.  Cardiovascular: Normal rate, regular rhythm and normal heart sounds.   Pulmonary/Chest: Effort normal  and breath sounds normal. No respiratory distress.  Abdominal: Soft. There is no tenderness.  Musculoskeletal: He exhibits no edema.  Neurological: He is alert.  Skin: Skin is warm and dry. He is not  diaphoretic.  Psychiatric: His behavior is normal.  Musculoskeletal: Patient with crush injury to the distal tip of the left fifth digit.  There is ecchymosis under the nail.  There is open tissue the distal tip without evidence of fracture on visual inspection.  There is swelling and ecchymosis of the proximal and distal interphalangeal joints. Patient is unable to bend his finger due to pain and swelling. ED Course  NERVE BLOCK Date/Time: 04/30/2013 12:15 PM Performed by: Arthor Captain Authorized by: Arthor Captain Consent: Verbal consent obtained. Risks and benefits: risks, benefits and alternatives were discussed Patient identity confirmed: verbally with patient Indications: pain relief and fracture Body area: upper extremity Nerve: digital Laterality: left Patient sedated: no Patient position: sitting Needle gauge: 25 G Location technique: anatomical landmarks Local anesthetic: lidocaine 2% without epinephrine Anesthetic total: 5 ml Outcome: pain improved Patient tolerance: Patient tolerated the procedure well with no immediate complications.   (including critical care time) Labs Reviewed  GLUCOSE, CAPILLARY - Abnormal; Notable for the following:    Glucose-Capillary 179 (*)    All other components within normal limits   No results found. 1. Open fracture of phalanx of left little finger, initial encounter     MDM  12:15 PM BP 143/76  Pulse 58  Temp(Src) 98 F (36.7 C) (Oral)  Resp 16  SpO2 100% Patient with fracture of the distal phalanx of the left fifth digit.  There is no nail fracture.  No visible bone on exam.  Digital block has had relief of pain to the patient.  The wound was washed thoroughly.  No tissue can be approximated in this crush injury.  I spoke  with Dr. Mina Marble about the injury.  The plan of care is to provide clindamycin for antibiotics, pain management, sterile dressing and splint.  Followup with the hand surgeons.  Dr. Mina Marble feels this is appropriate care and has asked that the patient call for followup appointment as soon as he is discharged.  Patient expresses understanding and agrees with plan of care.   Arthor Captain, PA-C 04/30/13 1629

## 2013-04-30 NOTE — ED Notes (Signed)
Patient transported to X-ray 

## 2013-05-03 NOTE — ED Provider Notes (Signed)
Medical screening examination/treatment/procedure(s) were conducted as a shared visit with non-physician practitioner(s) and myself.  I personally evaluated the patient during the encounter On my exam the patient was in no distress.  There was a notable digit deformity, but no other concerning aspects of his presentation. Patient received wound care, xr, analgesia, and was d/c to f/u w hand.  Gerhard Munch, MD 05/03/13 [phone]

## 2013-05-19 ENCOUNTER — Other Ambulatory Visit: Payer: Self-pay

## 2013-05-27 ENCOUNTER — Other Ambulatory Visit (HOSPITAL_COMMUNITY): Payer: Self-pay | Admitting: Orthopedic Surgery

## 2013-05-27 DIAGNOSIS — M4310 Spondylolisthesis, site unspecified: Secondary | ICD-10-CM

## 2013-05-31 ENCOUNTER — Ambulatory Visit (HOSPITAL_COMMUNITY)
Admission: RE | Admit: 2013-05-31 | Discharge: 2013-05-31 | Disposition: A | Discharge status: Home / Self Care | Payer: 59 | Source: Ambulatory Visit | Attending: Orthopedic Surgery | Admitting: Orthopedic Surgery

## 2013-05-31 DIAGNOSIS — M47817 Spondylosis without myelopathy or radiculopathy, lumbosacral region: Secondary | ICD-10-CM | POA: Insufficient documentation

## 2013-05-31 DIAGNOSIS — M51379 Other intervertebral disc degeneration, lumbosacral region without mention of lumbar back pain or lower extremity pain: Secondary | ICD-10-CM | POA: Insufficient documentation

## 2013-05-31 DIAGNOSIS — M4310 Spondylolisthesis, site unspecified: Secondary | ICD-10-CM | POA: Insufficient documentation

## 2013-05-31 DIAGNOSIS — M5137 Other intervertebral disc degeneration, lumbosacral region: Secondary | ICD-10-CM | POA: Insufficient documentation

## 2013-05-31 DIAGNOSIS — Z8546 Personal history of malignant neoplasm of prostate: Secondary | ICD-10-CM | POA: Insufficient documentation

## 2013-06-17 ENCOUNTER — Ambulatory Visit: Payer: 59 | Admitting: *Deleted

## 2013-06-18 ENCOUNTER — Encounter: Payer: 59 | Attending: Internal Medicine | Admitting: *Deleted

## 2013-06-18 ENCOUNTER — Encounter: Payer: Self-pay | Admitting: *Deleted

## 2013-06-18 DIAGNOSIS — Z713 Dietary counseling and surveillance: Secondary | ICD-10-CM | POA: Insufficient documentation

## 2013-06-18 DIAGNOSIS — E119 Type 2 diabetes mellitus without complications: Secondary | ICD-10-CM | POA: Insufficient documentation

## 2013-06-18 NOTE — Patient Instructions (Addendum)
Plan:  Record food intake over next 2 weeks for review at our next appt.

## 2013-06-18 NOTE — Progress Notes (Signed)
  Pump Follow Up Progress Note  Orders received from MD giving me permission to make insulin pump adjustments for the following patient. Dale Morrow, Valdes Male, 72 y.o., 02-04-1941  Reviewed blood glucose logs on 06/18/2013 via: [facility] and found the following:            Hypoglycemia Hyperglycemia Comments  Overnight Period:      Pre-Meal:    Breakfast  YES  Basal   Lunch  YES Basal   Supper     Post-Meal: Breakfast      Lunch      Supper     Bedtime:       Comments: His FBG are still too high so plan to add a basal rate at 3 AM - 6 AM that is a 10% increase to help bring these into target range. Pre lunch BG climbing so plan to increase daytime basal to bring these down too. Otherwise doing very well. Upon review of his food intake it was determined that he puts in a larger carbohydrate amount in order to get more insulin from the pump in an effort to control BG better. I have asked him to record his food intake for the next 2 weeks but continue with his Bolus habits for now. We can then assess his actual carb intake in relation to the insulin he is getting and adjust his Carb Ratio accordingly.  Pump Settings: Date: Current Date: 03/17/2013 changes are in [facility] Print   Basal Rate: Carb Ratio Sensitivity  Basal Rate: Carb Ratio Sensitivity   MN: 0.950 MN: 6.5 MN: 15 MN: 0.950 MN: 6.5 MN: 15  3 AM 1.00 11 AM: 7.5 11 AM: 30 3AM 1.10  (+) 11 AM: 7.5 11 AM: 30  6 AM 0.750   6 AM 0.850  (+)    5 PM 0.900   5 PM 0.900                                Plan: patient to continue using pump as directed and follow up in 2 weeks to concentrate on Carb Counting and to assess Carb Ratio settings

## 2013-07-01 ENCOUNTER — Encounter: Payer: 59 | Admitting: *Deleted

## 2013-07-01 DIAGNOSIS — E1065 Type 1 diabetes mellitus with hyperglycemia: Secondary | ICD-10-CM

## 2013-07-01 NOTE — Patient Instructions (Signed)
Plan: We adjusted your Carb Ratio for Lunch and Supper to better represent your actual Carb intake by doubling the Ratio in the pump. Start entering your actual carb value for Lunch and Supper Consider checking your BG 2 hours after Lunch and Supper for the next few days so we can evaluate the settings. Let me know next week the results.

## 2013-07-01 NOTE — Progress Notes (Signed)
  Pump Follow Up Progress Note  Orders received from MD giving me permission to make insulin pump adjustments for the following patient. Dale Morrow, Dale Morrow Male, 72 y.o., September 28, 1941  Reviewed blood glucose logs on 07/01/2013 via: [facility] and found the following:            Hypoglycemia Hyperglycemia Comments  Overnight Period:      Pre-Meal:    Breakfast      Lunch      Supper     Post-Meal: Breakfast      Lunch      Supper     Bedtime:       Comments: Average BG for past 2 weeks is down from 161 to 147 +/- 51 mg/dl, so last Basal Rate changes were successful. He continued to enter 6 Carb Choices (90 grams) for his lunch and supper meal even though the actual carb intake was closer to 30 grams and recorded his meals, insulin doses and BG on Log Sheet as I requested so I could assess what his actual Carb Ratio needs to be. His carb counting for breakfast was already appropriate so no changes there. Since his carb intake was 1/3 of what he told his pump he was eating, then tripling his Carb ratio for Lunch and Supper seems appropriate however I hesitate to be that aggressive. I decided to double his Carb Ratio at 11 AM from 2 units per Carb Choice to 4 units per Carb Choice. Patient directed to put in actual carb intake of 30 grams and to check BG 2 hours after lunch and supper through the weekend to evaluate effectiveness.   Pump Settings: Date: Current Date: 07/01/2013 changes are in [facility] Print   Basal Rate: Carb Ratio Sensitivity  Basal Rate: Carb Ratio Sensitivity   MN: 0.950 MN: 6.5 MN: 15 MN: 0.950 MN: 6.5 MN: 15  3 AM 1.10 11 AM: 7.5 11 AM: 30 3AM 1.10   11 AM: 3.75 11 AM: 30  6 AM 0.850   6 AM 0.850      5 PM 0.900   5 PM 0.900                                Plan: patient to continue using pump as directed and follow up in 1 week to assess new ICR for lunch and supper

## 2013-07-20 ENCOUNTER — Other Ambulatory Visit: Payer: Self-pay | Admitting: Orthopedic Surgery

## 2013-07-20 NOTE — Progress Notes (Signed)
Pt aware of where to come -bring meds and ins cards-drivers lic

## 2013-07-21 ENCOUNTER — Ambulatory Visit (HOSPITAL_BASED_OUTPATIENT_CLINIC_OR_DEPARTMENT_OTHER)
Admission: RE | Admit: 2013-07-21 | Discharge: 2013-07-21 | Disposition: A | Discharge status: Home / Self Care | Payer: 59 | Source: Ambulatory Visit | Attending: Orthopedic Surgery | Admitting: Orthopedic Surgery

## 2013-07-21 ENCOUNTER — Encounter (HOSPITAL_BASED_OUTPATIENT_CLINIC_OR_DEPARTMENT_OTHER)
Admission: RE | Disposition: A | Discharge status: Home / Self Care | Payer: Self-pay | Source: Ambulatory Visit | Attending: Orthopedic Surgery

## 2013-07-21 ENCOUNTER — Encounter (HOSPITAL_BASED_OUTPATIENT_CLINIC_OR_DEPARTMENT_OTHER): Payer: Self-pay

## 2013-07-21 DIAGNOSIS — C779 Secondary and unspecified malignant neoplasm of lymph node, unspecified: Secondary | ICD-10-CM | POA: Insufficient documentation

## 2013-07-21 DIAGNOSIS — Z794 Long term (current) use of insulin: Secondary | ICD-10-CM | POA: Insufficient documentation

## 2013-07-21 DIAGNOSIS — X58XXXA Exposure to other specified factors, initial encounter: Secondary | ICD-10-CM | POA: Insufficient documentation

## 2013-07-21 DIAGNOSIS — F329 Major depressive disorder, single episode, unspecified: Secondary | ICD-10-CM | POA: Insufficient documentation

## 2013-07-21 DIAGNOSIS — M1710 Unilateral primary osteoarthritis, unspecified knee: Secondary | ICD-10-CM | POA: Insufficient documentation

## 2013-07-21 DIAGNOSIS — C61 Malignant neoplasm of prostate: Secondary | ICD-10-CM | POA: Insufficient documentation

## 2013-07-21 DIAGNOSIS — IMO0002 Reserved for concepts with insufficient information to code with codable children: Secondary | ICD-10-CM | POA: Insufficient documentation

## 2013-07-21 DIAGNOSIS — E119 Type 2 diabetes mellitus without complications: Secondary | ICD-10-CM | POA: Insufficient documentation

## 2013-07-21 DIAGNOSIS — G5782 Other specified mononeuropathies of left lower limb: Secondary | ICD-10-CM

## 2013-07-21 DIAGNOSIS — I10 Essential (primary) hypertension: Secondary | ICD-10-CM | POA: Insufficient documentation

## 2013-07-21 DIAGNOSIS — Z9641 Presence of insulin pump (external) (internal): Secondary | ICD-10-CM | POA: Insufficient documentation

## 2013-07-21 DIAGNOSIS — F3289 Other specified depressive episodes: Secondary | ICD-10-CM | POA: Insufficient documentation

## 2013-07-21 DIAGNOSIS — Z87891 Personal history of nicotine dependence: Secondary | ICD-10-CM | POA: Insufficient documentation

## 2013-07-21 HISTORY — PX: MINOR AMPUTATION OF DIGIT: SHX6242

## 2013-07-21 SURGERY — MINOR AMPUTATION OF DIGIT
Anesthesia: LOCAL | Site: Finger | Laterality: Left

## 2013-07-21 MED ORDER — LIDOCAINE-EPINEPHRINE (PF) 1 %-1:200000 IJ SOLN
INTRAMUSCULAR | Status: DC | PRN
  Administered 2013-07-21: 2 mL

## 2013-07-21 MED ORDER — OXYCODONE-ACETAMINOPHEN 5-325 MG PO TABS: 1.0000 | ORAL_TABLET | ORAL | Status: DC | PRN

## 2013-07-21 SURGICAL SUPPLY — 55 items
BAG DECANTER FOR FLEXI CONT (MISCELLANEOUS) IMPLANT
BANDAGE ELASTIC 3 VELCRO ST LF (GAUZE/BANDAGES/DRESSINGS) ×1 IMPLANT
BANDAGE ELASTIC 4 VELCRO ST LF (GAUZE/BANDAGES/DRESSINGS) IMPLANT
BANDAGE GAUZE ELAST BULKY 4 IN (GAUZE/BANDAGES/DRESSINGS) ×2 IMPLANT
BLADE SURG 15 STRL LF DISP TIS (BLADE) ×1 IMPLANT
BLADE SURG 15 STRL SS (BLADE) ×2
BNDG CMPR 9X4 STRL LF SNTH (GAUZE/BANDAGES/DRESSINGS)
BNDG CMPR MD 5X2 ELC HKLP STRL (GAUZE/BANDAGES/DRESSINGS)
BNDG COHESIVE 1X5 TAN STRL LF (GAUZE/BANDAGES/DRESSINGS) ×2 IMPLANT
BNDG ELASTIC 2 VLCR STRL LF (GAUZE/BANDAGES/DRESSINGS) IMPLANT
BNDG ESMARK 4X9 LF (GAUZE/BANDAGES/DRESSINGS) IMPLANT
BRUSH SCRUB EZ PLAIN DRY (MISCELLANEOUS) IMPLANT
CANISTER SUCTION 1200CC (MISCELLANEOUS) IMPLANT
CLOTH BEACON ORANGE TIMEOUT ST (SAFETY) ×1 IMPLANT
CORDS BIPOLAR (ELECTRODE) IMPLANT
COVER TABLE BACK 60X90 (DRAPES) ×2 IMPLANT
CUFF TOURNIQUET SINGLE 18IN (TOURNIQUET CUFF) IMPLANT
DECANTER SPIKE VIAL GLASS SM (MISCELLANEOUS) IMPLANT
DRAPE EXTREMITY T 121X128X90 (DRAPE) ×2 IMPLANT
DRAPE SURG 17X23 STRL (DRAPES) ×2 IMPLANT
DURAPREP 26ML APPLICATOR (WOUND CARE) ×2 IMPLANT
GAUZE SPONGE 4X4 16PLY XRAY LF (GAUZE/BANDAGES/DRESSINGS) IMPLANT
GAUZE XEROFORM 1X8 LF (GAUZE/BANDAGES/DRESSINGS) ×1 IMPLANT
GLOVE BIO SURGEON STRL SZ8 (GLOVE) ×2 IMPLANT
GLOVE ECLIPSE 6.5 STRL STRAW (GLOVE) ×1 IMPLANT
GOWN BRE IMP PREV XXLGXLNG (GOWN DISPOSABLE) ×2 IMPLANT
GOWN PREVENTION PLUS XLARGE (GOWN DISPOSABLE) ×2 IMPLANT
NDL HYPO 25X1 1.5 SAFETY (NEEDLE) IMPLANT
NDL SAFETY ECLIPSE 18X1.5 (NEEDLE) IMPLANT
NEEDLE HYPO 18GX1.5 SHARP (NEEDLE)
NEEDLE HYPO 22GX1.5 SAFETY (NEEDLE) IMPLANT
NEEDLE HYPO 25X1 1.5 SAFETY (NEEDLE) ×2 IMPLANT
NS IRRIG 1000ML POUR BTL (IV SOLUTION) ×2 IMPLANT
PACK BASIN DAY SURGERY FS (CUSTOM PROCEDURE TRAY) ×2 IMPLANT
PAD CAST 3X4 CTTN HI CHSV (CAST SUPPLIES) ×1 IMPLANT
PAD CAST 4YDX4 CTTN HI CHSV (CAST SUPPLIES) IMPLANT
PADDING CAST ABS 4INX4YD NS (CAST SUPPLIES) ×1
PADDING CAST ABS COTTON 4X4 ST (CAST SUPPLIES) ×1 IMPLANT
PADDING CAST COTTON 3X4 STRL (CAST SUPPLIES)
PADDING CAST COTTON 4X4 STRL (CAST SUPPLIES)
SHEET MEDIUM DRAPE 40X70 STRL (DRAPES) ×2 IMPLANT
SPLINT PLASTER CAST XFAST 3X15 (CAST SUPPLIES) IMPLANT
SPLINT PLASTER CAST XFAST 4X15 (CAST SUPPLIES) ×5 IMPLANT
SPLINT PLASTER XTRA FAST SET 4 (CAST SUPPLIES) ×5
SPLINT PLASTER XTRA FASTSET 3X (CAST SUPPLIES)
SPONGE GAUZE 4X4 12PLY (GAUZE/BANDAGES/DRESSINGS) ×4 IMPLANT
STOCKINETTE 4X48 STRL (DRAPES) ×2 IMPLANT
SUCTION FRAZIER TIP 10 FR DISP (SUCTIONS) IMPLANT
SUT VICRYL RAPID 5 0 P 3 (SUTURE) IMPLANT
SUT VICRYL RAPIDE 4/0 PS 2 (SUTURE) ×1 IMPLANT
SYR BULB 3OZ (MISCELLANEOUS) ×2 IMPLANT
SYRINGE 10CC LL (SYRINGE) ×1 IMPLANT
TOWEL OR 17X24 6PK STRL BLUE (TOWEL DISPOSABLE) ×2 IMPLANT
TUBE CONNECTING 20X1/4 (TUBING) IMPLANT
UNDERPAD 30X30 INCONTINENT (UNDERPADS AND DIAPERS) ×2 IMPLANT

## 2013-07-21 NOTE — H&P (Signed)
Dale Morrow is an 72 y.o. male.   Chief Complaint: left small tip pain and deformity HPI: as above s/p conservative care for open crush to left small finger now with painful tip  Past Medical History  Diagnosis Date  . Arthritis     left knee  . Depression   . De Quervain's tenosynovitis, left 04/2012  . Diabetes mellitus     Insulin pump  . Hypertension     under control; takes BP med. to protect kidneys due to DM  . Prostate cancer, primary, with metastasis from prostate to other site     metastasis to lymph nodes  . PONV (postoperative nausea and vomiting)     Past Surgical History  Procedure Laterality Date  . Hemorrhoid surgery  1970s  . Anterior cruciate ligament repair      right knee  . Wrist arthroscopy  05/03/2010    right; and release of 1st dorsal compartment  . Knee arthrotomy  03/18/2008    left; with scar exc.  [name] Knee arthroscopy  09/25/2007; 04/15/2005    left  . Knee joint manipulation  06/29/2007    left  . Total knee arthroplasty  04/29/2007    left  . Dorsal compartment release  04/21/2012    Procedure: RELEASE DORSAL COMPARTMENT (DEQUERVAIN);  Surgeon: Wyn Forster., MD;  Location: [facility];  Service: Orthopedics;  Laterality: Left;  left 1st dorsal compartment release left wrist     Family History  Problem Relation Age of Onset  . Heart Problems Father    Social History:  reports that he quit smoking about 33 years ago. He has never used smokeless tobacco. He reports that he does not drink alcohol or use illicit drugs.  Allergies:  Allergies  Allergen Reactions  . Bee Venom Anaphylaxis  . Shellfish Allergy Anaphylaxis    No prescriptions prior to admission    No results found for this or any previous visit (from the past 48 hour(s)). No results found.  Review of Systems  All other systems reviewed and are negative.    There were no vitals taken for this visit. Physical Exam  Constitutional: He is oriented to  person, place, and time. He appears well-developed and well-nourished.  HENT:  Head: Normocephalic and atraumatic.  Cardiovascular: Normal rate.   Respiratory: Effort normal.  Musculoskeletal:       Left hand: He exhibits tenderness and deformity.  Left small painful tip s/p crush injury  Neurological: He is alert and oriented to person, place, and time.  Skin: Skin is warm.  Psychiatric: He has a normal mood and affect. His behavior is normal. Judgment and thought content normal.     Assessment/Plan As above  Plan revision  Chardae Mulkern A 07/21/2013, 11:00 AM

## 2013-07-21 NOTE — Op Note (Signed)
See note [id number]

## 2013-07-22 ENCOUNTER — Encounter (HOSPITAL_BASED_OUTPATIENT_CLINIC_OR_DEPARTMENT_OTHER): Payer: Self-pay | Admitting: Orthopedic Surgery

## 2013-07-22 NOTE — Op Note (Signed)
[name], [name] NO.:  [id number]  MEDICAL RECORD NO.:  [id number]  LOCATION:                               FACILITY:  [facility]  PHYSICIAN:  Artist Pais. Preslie Depasquale, M.D.DATE OF BIRTH:  [birth year]  DATE OF PROCEDURE:  07/21/2013 DATE OF DISCHARGE:  07/21/2013                              OPERATIVE REPORT   PREOPERATIVE DIAGNOSIS:  Neuroma, left small finger, status post crush injury.  POSTOPERATIVE DIAGNOSIS:  Neuroma, left small finger, status post crush injury.  PROCEDURE:  Excision of neuroma, ulnar side of the left small finger.  SURGEON:  Artist Pais. Mina Marble, MD  ASSISTANT:  None.  ANESTHESIA:  Local 1% lidocaine with epinephrine block performed by the surgeon, 2 mL.  DRAINS:  None.  SPECIMENS:  None.  TOURNIQUET:  None.  DESCRIPTION OF PROCEDURE:  Prior to being brought to the operating suite, 2 mL of 1% lidocaine with epinephrine solution was injected volarly at the DIP flexion crease carefully.  Once he was numb, he was brought into the operating suite.  Prepped and draped in usual sterile fashion.  Once this was done, an L-shaped incision was made on the left small finger and a radial based flap was elevated, incorporating a bulbous tip component that was quite painful.  This was elliptically incised.  The flap was raised.  The neuroma was seen coming off the digital nerve on the ulnar side.  This was carefully excised sharply. The wound was then irrigated and loosely closed with 5-0 Vicryl Rapide. Xeroform, 4x4s, and Coban wrap was applied.  The patient tolerated the procedure well, went to the recovery room in stable fashion.     Artist Pais Mina Marble, M.D.     MAW/MEDQ  D:  07/21/2013  T:  07/22/2013  Job:  [id number]

## 2013-07-29 ENCOUNTER — Encounter: Payer: 59 | Attending: Internal Medicine | Admitting: *Deleted

## 2013-07-29 DIAGNOSIS — Z713 Dietary counseling and surveillance: Secondary | ICD-10-CM | POA: Insufficient documentation

## 2013-07-29 DIAGNOSIS — E1065 Type 1 diabetes mellitus with hyperglycemia: Secondary | ICD-10-CM

## 2013-07-29 DIAGNOSIS — E119 Type 2 diabetes mellitus without complications: Secondary | ICD-10-CM | POA: Insufficient documentation

## 2013-08-17 ENCOUNTER — Ambulatory Visit (INDEPENDENT_AMBULATORY_CARE_PROVIDER_SITE_OTHER): Payer: 59 | Admitting: Neurology

## 2013-08-17 ENCOUNTER — Ambulatory Visit: Payer: 59 | Admitting: Neurology

## 2013-08-17 ENCOUNTER — Encounter: Payer: Self-pay | Admitting: Neurology

## 2013-08-17 VITALS — BP 100/61 | HR 67 | Ht 67.0 in | Wt 194.0 lb

## 2013-08-17 DIAGNOSIS — G5139 Clonic hemifacial spasm, unspecified: Secondary | ICD-10-CM

## 2013-08-17 DIAGNOSIS — G518 Other disorders of facial nerve: Secondary | ICD-10-CM

## 2013-08-17 MED ORDER — INCOBOTULINUMTOXINA 50 UNITS IM SOLR
50.0000 [IU] | Freq: Once | INTRAMUSCULAR | Status: AC
  Administered 2013-08-17: 50 [IU] via INTRAMUSCULAR

## 2013-08-17 NOTE — Progress Notes (Signed)
History of Present Illness:   [name] is a 72 years old right-handed Caucasian male, accompanied by his wife, referred by primary care physician for evaluation of tremor   He has family history of tremor, mother at her old age,  his sister, maternal uncle all had bilateral hand tremor, he also has past medical history of hypertension diabetes, insulin pump, hyperlipidemia, prostate cancer, status post radiation therapy. He  has a history of bilateral Bell's palsy, with residual left upper and lower face weakness  He noticed bilateral hand shaking over the past 6 years, difficulty writing, holding utensils, recent 6 months, he also noticed voice change, there was a tremorous quality to it  He denies loss sense of smell, and there was no REM sleep disorder, no gait difficulty. TSH was normal. He also has mild tremorish voice.  He was placed on Propranolol   in 2013, with moderate response, with bp at low normal side 100/66, myosin 50mg  qhs was added on Nov /2013, He reported mild  improvement,   His bilateral hands tremor, difficulty writing, difficulty holding a pencil, difficulty using utensil, he also has a years history of left facial spasm, mainly around his left eye, to the point of closing his left eye sometimes, his bilateral hands tremor has much improved with higher dose primidone 50 mg 2 tablets 3 times a day   UPDATE Nov 4th 2014: He is  doing very well with last xeomin injection in July  2nd 2014, he received 32.5 units total, he noticed less left frontalis movement.    Physical Exam  Neck: supple no carotid bruits Respiratory: clear to auscultation bilaterally Cardiovascular: regular rate rhythm  Neurologic Exam  Mental Status: pleasant, awake, alert, cooperative to history, talking, and casual conversation. MILD TREMORISH VOICE Cranial Nerves: CN II-XII pupils were equal round reactive to light.  Fundi were sharp bilaterally.  Extraocular movements were full.  Visual  fields were full on confrontational test.  Facial sensation  were normal. LEFT UPPER AND LOWER FACE WEAKNESS.Occasional left orbicularis oculi muscle twitching.  Hearing was intact to finger rubbing bilaterally.  Uvula tongue were midline.  Head turning and shoulder shrugging were normal and symmetric.  Tongue protrusion into the cheeks strength were normal.  Motor: Normal tone, bulk, and strength.Mod  essential tremor noted right > left. Sensory: Normal to light touch, pinprick, proprioception, and vibratory sensation. Coordination: Normal finger-to-nose, heel-to-shin.  There was no dysmetria noticed. Gait and Station: Narrow based and steady, was able to perform tiptoe, heel, and tandem walking without difficulty.   Reflexes: Deep tendon reflexes: Biceps: 2/2, Brachioradialis: 2/2, Triceps: 2/2, Pateller: 2/2, Achilles: 2/2.  Plantar responses are flexor.   Assessment and Plan:  72 Years old gentleman with history of essential tremor, previous left Bell's palsy, now with presenting left hemifacial spasm, he came in today for EMG guided xeomin injection for his left hemifacial spasm. 50 units was dissolved into 1 cc of normal saline, Lot [id number], expiration March 2015  Under EMG guidance, 17.5 units was used, discarded 32.5 units,  Injection was placed around the left orbicularis oculi, at  3,   6, 8 oclock, 2.5 units at each injection site a total of 7.5 units.  Right frontalis 2.5 x2=5  Right orbicularis oculi at 9 o'clock 5 units  He is to continue to take primadone 50 mg 2tabs 3 times a day for his essential tremor, He is also taking propranolol 40mg  bid,  return to clinic in 3 months for repeat injection

## 2013-08-19 ENCOUNTER — Other Ambulatory Visit: Payer: Self-pay

## 2013-09-02 ENCOUNTER — Other Ambulatory Visit: Payer: Self-pay | Admitting: Neurology

## 2013-09-08 NOTE — Progress Notes (Signed)
  Pump Follow Up Progress Note  Orders received from MD giving me permission to make insulin pump adjustments for the following patient. Dale Morrow, Dale Morrow Male, 72 y.o., 1941/03/20  Reviewed blood glucose logs on 07/01/2013 via: [facility] and found the following:            Hypoglycemia Hyperglycemia Comments  Overnight Period:      Pre-Meal:    Breakfast  YES BASAL   Lunch      Supper     Post-Meal: Breakfast      Lunch      Supper     Bedtime:       Comments: Average BG for past 2 weeks is 173 +/- 70 mg/dl, and FBG too high so plan to increase Basal Rate at MN by 10%.  He has started entering his actual Carb intake with new stronger settings and it is coming out much better. Will continue with current Carb Ratios and Sensitivity Factors.   Pump Settings: Date: Current Date: 07/29/2013 changes are in [facility] Print   Basal Rate: Carb Ratio Sensitivity  Basal Rate: Carb Ratio Sensitivity   MN: 0.950 MN: 6.5 MN: 15 MN: 1.10  (+) MN: 6.5 MN: 15  3 AM 1.10 11 AM: 3.8 11 AM: 30 3AM 1.10   11 AM: 3.75 11 AM: 30  6 AM 0.850   6 AM 0.850      5 PM 0.900   5 PM 0.900                                Plan: patient to continue using pump as directed and follow up in 1 month or as needed

## 2013-10-28 ENCOUNTER — Encounter: Payer: 59 | Attending: Family Medicine | Admitting: *Deleted

## 2013-10-28 DIAGNOSIS — E1065 Type 1 diabetes mellitus with hyperglycemia: Secondary | ICD-10-CM

## 2013-10-28 DIAGNOSIS — Z713 Dietary counseling and surveillance: Secondary | ICD-10-CM | POA: Insufficient documentation

## 2013-10-28 DIAGNOSIS — IMO0002 Reserved for concepts with insufficient information to code with codable children: Secondary | ICD-10-CM | POA: Insufficient documentation

## 2013-10-28 NOTE — Progress Notes (Signed)
  Pump Follow Up Progress Note for 10/28/13  Orders received from MD giving me permission to make insulin pump adjustments for the following patient. Dale Morrow, Dale Morrow Male, 73 y.o., Apr 30, 1941  Reviewed blood glucose logs on 10/28/2013 via: [facility] and found the following:            Hypoglycemia Hyperglycemia Comments  Overnight Period:   YES and rising BASAL  Pre-Meal:    Breakfast      Lunch      Supper     Post-Meal: Breakfast      Lunch  YES ICR   Supper     Bedtime:       Comments: Average BG for past 2 weeks is 142 +/- 59 mg/dl,  He has started entering his actual Carb intake with new stronger settings and it is coming out much better. The excursions are not concerning to him or to me at this time. We will continue with current Carb Ratios and Sensitivity Factors.   Pump Settings: Date: Current Date: 10/28/2013 changes are in [facility] Print (no changes made at this visit)   Basal Rate: Carb Ratio Sensitivity  Basal Rate: Carb Ratio Sensitivity   MN: 1.10 MN: 6.5 MN: 15 MN: 1.10  MN: 6.5 MN: 15  3 AM 1.10 11 AM: 3.8 11 AM: 30 3AM 1.10   11 AM: 3.75 11 AM: 30  6 AM 0.850   6 AM 0.850      5 PM 0.900   5 PM 0.900                                Plan: patient to continue using pump as directed and follow up in 1 month or as needed

## 2014-01-13 ENCOUNTER — Other Ambulatory Visit (HOSPITAL_COMMUNITY): Payer: Self-pay | Admitting: Urology

## 2014-01-13 DIAGNOSIS — M899 Disorder of bone, unspecified: Secondary | ICD-10-CM

## 2014-01-13 DIAGNOSIS — C61 Malignant neoplasm of prostate: Secondary | ICD-10-CM

## 2014-01-13 DIAGNOSIS — M949 Disorder of cartilage, unspecified: Secondary | ICD-10-CM

## 2014-01-17 ENCOUNTER — Ambulatory Visit (HOSPITAL_COMMUNITY)
Admission: RE | Admit: 2014-01-17 | Discharge: 2014-01-17 | Disposition: A | Discharge status: Home / Self Care | Payer: 59 | Source: Ambulatory Visit | Attending: Urology | Admitting: Urology

## 2014-01-17 DIAGNOSIS — M949 Disorder of cartilage, unspecified: Secondary | ICD-10-CM

## 2014-01-17 DIAGNOSIS — M899 Disorder of bone, unspecified: Secondary | ICD-10-CM

## 2014-01-17 DIAGNOSIS — C61 Malignant neoplasm of prostate: Secondary | ICD-10-CM | POA: Insufficient documentation

## 2014-01-17 DIAGNOSIS — I00 Rheumatic fever without heart involvement: Secondary | ICD-10-CM | POA: Insufficient documentation

## 2014-01-17 DIAGNOSIS — Z13820 Encounter for screening for osteoporosis: Secondary | ICD-10-CM | POA: Insufficient documentation

## 2014-01-26 ENCOUNTER — Encounter: Payer: 59 | Attending: Family Medicine | Admitting: *Deleted

## 2014-01-26 DIAGNOSIS — E1065 Type 1 diabetes mellitus with hyperglycemia: Secondary | ICD-10-CM | POA: Insufficient documentation

## 2014-01-26 DIAGNOSIS — E119 Type 2 diabetes mellitus without complications: Secondary | ICD-10-CM

## 2014-01-26 DIAGNOSIS — Z713 Dietary counseling and surveillance: Secondary | ICD-10-CM | POA: Insufficient documentation

## 2014-01-26 DIAGNOSIS — IMO0002 Reserved for concepts with insufficient information to code with codable children: Secondary | ICD-10-CM | POA: Insufficient documentation

## 2014-01-26 NOTE — Progress Notes (Signed)
  Pump Follow Up Progress Note for 01/26/14  Orders received from MD giving me permission to make insulin pump adjustments for the following patient. Dale Morrow, Dale Morrow Male, 73 y.o., 03-15-1941  Reviewed blood glucose logs on 10/28/2013 via: [facility] and found the following:            Hypoglycemia Hyperglycemia Comments  Overnight Period:   YES and rising BASAL  Pre-Meal:    Breakfast      Lunch      Supper     Post-Meal: Breakfast      Lunch      Supper     Bedtime:       Comments: Average BG for past 2 weeks is 139 +/- 57 mg/dl which is excellent! He states he has a few incidences of hypoglycemia but they are typically after increased activity during the daytime. He states he does not choose to use the Temp Basal to prevent these, it is "one more thing to remember to do" so he carries glucose tablets and a granola bar with him. He continues to go to gym for 2 hours regularly. He agrees with my suggestion of increasing his basal rates overnight to stabilize the rise pre dawn.  Pump Settings: Date: Current Date: 01/26/2014 changes are in [facility] Print   Basal Rate: Carb Ratio Sensitivity  Basal Rate: Carb Ratio Sensitivity   MN: 1.10 MN: 6.5 MN: 15 MN: 1.20  (+) MN: 6.5 MN: 15  3 AM 1.10 11 AM: 3.8 11 AM: 30 3AM 1.20  (+) 11 AM: 3.75 11 AM: 30  6 AM 0.850   6 AM 0.850      5 PM 0.900   5 PM 0.900                                Plan: patient to continue using pump as directed and follow up in 3 months or as needed He expressed interest in a Support Group, provided him the Flyer for the [facility] Group as they typically are also on insulin pump too.

## 2014-02-21 ENCOUNTER — Other Ambulatory Visit: Payer: Self-pay | Admitting: Neurology

## 2014-02-24 ENCOUNTER — Other Ambulatory Visit: Payer: Self-pay | Admitting: Neurology

## 2014-03-09 ENCOUNTER — Ambulatory Visit (INDEPENDENT_AMBULATORY_CARE_PROVIDER_SITE_OTHER): Payer: 59 | Admitting: Neurology

## 2014-03-09 ENCOUNTER — Encounter: Payer: Self-pay | Admitting: Neurology

## 2014-03-09 ENCOUNTER — Telehealth: Payer: Self-pay | Admitting: *Deleted

## 2014-03-09 VITALS — BP 112/65 | HR 64 | Ht 67.0 in | Wt 201.0 lb

## 2014-03-09 DIAGNOSIS — G252 Other specified forms of tremor: Secondary | ICD-10-CM

## 2014-03-09 DIAGNOSIS — G250 Essential tremor: Secondary | ICD-10-CM

## 2014-03-09 DIAGNOSIS — G518 Other disorders of facial nerve: Secondary | ICD-10-CM

## 2014-03-09 DIAGNOSIS — G5139 Clonic hemifacial spasm, unspecified: Secondary | ICD-10-CM

## 2014-03-09 MED ORDER — INCOBOTULINUMTOXINA 50 UNITS IM SOLR
50.0000 [IU] | Freq: Once | INTRAMUSCULAR | Status: AC
Start: 2014-03-09 — End: 2014-03-09
  Administered 2014-03-09: 50 [IU] via INTRAMUSCULAR

## 2014-03-09 MED ORDER — PROPRANOLOL HCL 60 MG PO TABS: 60.0000 mg | ORAL_TABLET | Freq: Two times a day (BID) | ORAL | Status: DC

## 2014-03-09 NOTE — Telephone Encounter (Signed)
Rx was originally sent for 30 day supply.  I have resent it for 90 day supply.

## 2014-03-09 NOTE — Progress Notes (Signed)
History of Present Illness:   [name] is a 73 years old right-handed Caucasian male, accompanied by his wife, referred by primary care physician for evaluation of tremor   He has family history of tremor, mother at her old age,  his sister, maternal uncle all had bilateral hand tremor, he also has past medical history of hypertension diabetes, insulin pump, hyperlipidemia, prostate cancer, status post radiation therapy. He  has a history of bilateral Bell's palsy, with residual left upper and lower face weakness  He noticed bilateral hand shaking over the past 6 years, difficulty writing, holding utensils, recent 6 months, he also noticed voice change, there was a tremorous quality to it  He denies loss sense of smell, and there was no REM sleep disorder, no gait difficulty. TSH was normal. He also has mild tremorish voice.  He was placed on Propranolol   in 2013, with moderate response, with bp at low normal side 100/66, myosin 50mg  qhs was added on Nov /2013, He reported mild  improvement,   His bilateral hands tremor, difficulty writing, difficulty holding a pencil, difficulty using utensil, he also has a years history of left facial spasm, mainly around his left eye, to the point of closing his left eye sometimes, his bilateral hands tremor has much improved with higher dose primidone 50 mg 2 tablets 3 times a day   UPDATE May 27th 3015 He is  doing very well with last xeomin injection in Nov 2014, this is 6 months out from previous injection, he noticed increased left eye twitching, sometimes to the point of shotting closing his left eye, he also noticed increased bilateral hands tremor, voice tremor of the past 6 months, he is taking primidone 50 mg 2 tablets 3 times a day, without significant side effect, or benefit, he is also taking propanolol 40 mg twice a day, early morning, late night, seems to help him some, the most bothersome symptoms are in the middle of the day, after his  morning exercise,   Physical Exam  Neck: supple no carotid bruits Respiratory: clear to auscultation bilaterally Cardiovascular: regular rate rhythm  Neurologic Exam  Mental Status: pleasant, awake, alert, cooperative to history, talking, and casual conversation. MILD TREMORISH VOICE Cranial Nerves: CN II-XII pupils were equal round reactive to light.  Fundi were sharp bilaterally.  Extraocular movements were full.  Visual fields were full on confrontational test.  Facial sensation  were normal. LEFT UPPER AND LOWER FACE WEAKNESS.Occasional left orbicularis oculi muscle twitching.  Hearing was intact to finger rubbing bilaterally.  Uvula tongue were midline.  Head turning and shoulder shrugging were normal and symmetric.  Tongue protrusion into the cheeks strength were normal.  Motor: Normal tone, bulk, and strength.Mod  essential tremor noted right > left. Sensory: Normal to light touch, pinprick, proprioception, and vibratory sensation. Coordination: Normal finger-to-nose, heel-to-shin.  There was no dysmetria noticed. Gait and Station: Narrow based and steady, was able to perform tiptoe, heel, and tandem walking without difficulty.   Reflexes: Deep tendon reflexes: Biceps: 2/2, Brachioradialis: 2/2, Triceps: 2/2, Pateller: 2/2, Achilles: 2/2.  Plantar responses are flexor.   Assessment and Plan:  73 Years old gentleman with history of essential tremor, previous left Bell's palsy, now with presenting left hemifacial spasm, he came in today for EMG guided  xeomin injection for his left hemifacial spasm. 50 units1/ cc of normal saline,    Under EMG guidance, 50  units was used   Injection was placed around the right orbicularis oculi,  at  2,3,4,5,6, 8 oclock, 2.5 units at each injection site a total of 15 units.  Right frontalis 2.5 x2=5   Right corrugate 5 Left corrugate 5  Procereus 5  Right zygomatic major 2.5 Left platysmus 5.7  Left orbicularis oculi at 8, 9 o'clock 5  units  He is to continue to take primadone 50 mg 2tabs 3 times a day for his essential tremor, He is also taking propranolol 40mg  bid for tremor, complains of increased tremor, I will increase his propanolol to 60 mg, moving the time of medications before lunch, and before dinner  return to clinic in 3 months for repeat injection

## 2014-03-29 ENCOUNTER — Other Ambulatory Visit (HOSPITAL_COMMUNITY): Payer: Self-pay | Admitting: Family Medicine

## 2014-03-29 DIAGNOSIS — R109 Unspecified abdominal pain: Secondary | ICD-10-CM

## 2014-03-30 ENCOUNTER — Ambulatory Visit (HOSPITAL_COMMUNITY)
Admission: RE | Admit: 2014-03-30 | Discharge: 2014-03-30 | Disposition: A | Discharge status: Home / Self Care | Payer: 59 | Source: Ambulatory Visit | Attending: Family Medicine | Admitting: Family Medicine

## 2014-03-30 ENCOUNTER — Other Ambulatory Visit (HOSPITAL_COMMUNITY): Payer: Self-pay | Admitting: Family Medicine

## 2014-03-30 DIAGNOSIS — R109 Unspecified abdominal pain: Secondary | ICD-10-CM | POA: Insufficient documentation

## 2014-03-30 DIAGNOSIS — K5730 Diverticulosis of large intestine without perforation or abscess without bleeding: Secondary | ICD-10-CM | POA: Insufficient documentation

## 2014-03-30 MED ORDER — IOHEXOL 300 MG/ML  SOLN
80.0000 mL | Freq: Once | INTRAMUSCULAR | Status: AC | PRN
  Administered 2014-03-30: 80 mL via INTRAVENOUS

## 2014-04-01 ENCOUNTER — Encounter (HOSPITAL_COMMUNITY): Payer: Self-pay | Admitting: Pharmacy Technician

## 2014-04-01 ENCOUNTER — Encounter (HOSPITAL_COMMUNITY): Payer: Self-pay | Admitting: *Deleted

## 2014-04-01 ENCOUNTER — Other Ambulatory Visit: Payer: Self-pay | Admitting: Gastroenterology

## 2014-04-01 NOTE — Addendum Note (Signed)
Addended byClarene Essex on: 04/01/2014 01:12 PM   Modules accepted: Orders

## 2014-04-07 ENCOUNTER — Ambulatory Visit (HOSPITAL_COMMUNITY): Payer: 59 | Admitting: Anesthesiology

## 2014-04-07 ENCOUNTER — Ambulatory Visit (HOSPITAL_COMMUNITY)
Admission: RE | Admit: 2014-04-07 | Discharge: 2014-04-07 | Disposition: A | Discharge status: Home / Self Care | Payer: 59 | Source: Ambulatory Visit | Attending: Gastroenterology | Admitting: Gastroenterology

## 2014-04-07 ENCOUNTER — Encounter (HOSPITAL_COMMUNITY): Payer: Self-pay | Admitting: *Deleted

## 2014-04-07 ENCOUNTER — Encounter (HOSPITAL_COMMUNITY)
Admission: RE | Disposition: A | Discharge status: Home / Self Care | Payer: Self-pay | Source: Ambulatory Visit | Attending: Gastroenterology

## 2014-04-07 ENCOUNTER — Encounter (HOSPITAL_COMMUNITY): Payer: 59 | Admitting: Anesthesiology

## 2014-04-07 DIAGNOSIS — E119 Type 2 diabetes mellitus without complications: Secondary | ICD-10-CM | POA: Insufficient documentation

## 2014-04-07 DIAGNOSIS — I10 Essential (primary) hypertension: Secondary | ICD-10-CM | POA: Insufficient documentation

## 2014-04-07 DIAGNOSIS — R198 Other specified symptoms and signs involving the digestive system and abdomen: Secondary | ICD-10-CM | POA: Insufficient documentation

## 2014-04-07 DIAGNOSIS — D126 Benign neoplasm of colon, unspecified: Secondary | ICD-10-CM | POA: Insufficient documentation

## 2014-04-07 DIAGNOSIS — Z794 Long term (current) use of insulin: Secondary | ICD-10-CM | POA: Insufficient documentation

## 2014-04-07 DIAGNOSIS — Z79899 Other long term (current) drug therapy: Secondary | ICD-10-CM | POA: Insufficient documentation

## 2014-04-07 DIAGNOSIS — F3289 Other specified depressive episodes: Secondary | ICD-10-CM | POA: Insufficient documentation

## 2014-04-07 DIAGNOSIS — Z87891 Personal history of nicotine dependence: Secondary | ICD-10-CM | POA: Insufficient documentation

## 2014-04-07 DIAGNOSIS — F329 Major depressive disorder, single episode, unspecified: Secondary | ICD-10-CM | POA: Insufficient documentation

## 2014-04-07 HISTORY — DX: Other complications of anesthesia, initial encounter: T88.59XA

## 2014-04-07 HISTORY — DX: Tremor, unspecified: R25.1

## 2014-04-07 HISTORY — PX: COLONOSCOPY WITH PROPOFOL: SHX5780

## 2014-04-07 HISTORY — DX: Adverse effect of unspecified anesthetic, initial encounter: T41.45XA

## 2014-04-07 LAB — GLUCOSE, CAPILLARY: GLUCOSE-CAPILLARY: 194 mg/dL — AB (ref 70–99)

## 2014-04-07 SURGERY — COLONOSCOPY WITH PROPOFOL
Anesthesia: Monitor Anesthesia Care

## 2014-04-07 MED ORDER — PROPOFOL 10 MG/ML IV BOLUS
INTRAVENOUS | Status: AC
Start: 2014-04-07 — End: 2014-04-07
  Filled 2014-04-07: qty 40

## 2014-04-07 MED ORDER — PROPOFOL 10 MG/ML IV BOLUS
INTRAVENOUS | Status: AC
  Filled 2014-04-07: qty 20

## 2014-04-07 MED ORDER — SODIUM CHLORIDE 0.9 % IV SOLN: INTRAVENOUS | Status: DC

## 2014-04-07 MED ORDER — KETAMINE HCL 10 MG/ML IJ SOLN
INTRAMUSCULAR | Status: DC | PRN
  Administered 2014-04-07: 20 mg via INTRAVENOUS

## 2014-04-07 MED ORDER — PROPOFOL INFUSION 10 MG/ML OPTIME
INTRAVENOUS | Status: DC | PRN
  Administered 2014-04-07: 120 ug/kg/min via INTRAVENOUS

## 2014-04-07 MED ORDER — LACTATED RINGERS IV SOLN
INTRAVENOUS | Status: DC
  Administered 2014-04-07 (×2): via INTRAVENOUS

## 2014-04-07 SURGICAL SUPPLY — 22 items

## 2014-04-07 NOTE — Progress Notes (Signed)
Dale Morrow 10:28 AM  Subjective: Patient without any new symptoms since we've seen him in the office  Objective: Vital signs stable afebrile no acute distress exam please see pre-assessment evaluation  Assessment: Change in bowel habits abnormal CAT scan almost due for colonoscopy  Plan: Okay to proceed with colonoscopy with anesthesia assistance  [facility] E

## 2014-04-07 NOTE — Transfer of Care (Signed)
Immediate Anesthesia Transfer of Care Note  Patient: Dale Morrow  Procedure(s) Performed: Procedure(s): COLONOSCOPY WITH PROPOFOL (N/A)  Patient Location: PACU  Anesthesia Type:MAC  Level of Consciousness: awake, alert  and oriented  Airway & Oxygen Therapy: Patient Spontanous Breathing and Patient connected to face mask oxygen  Post-op Assessment: Report given to PACU RN and Post -op Vital signs reviewed and stable  Post vital signs: Reviewed and stable  Complications: No apparent anesthesia complications

## 2014-04-07 NOTE — Op Note (Signed)
[facility], [address]   COLONOSCOPY PROCEDURE REPORT  PATIENT: Dale Morrow, Dale Morrow  MR#: [id number] BIRTHDATE: August 10, 1941 , 73  yrs. old GENDER: Male ENDOSCOPIST: Clarene Essex, MD REFERRED BY: PROCEDURE DATE:  04/07/2014 PROCEDURE:   Colonoscopy with biopsy ASA CLASS:   Class II INDICATIONS:an abnormal CT and Change in bowel habits. MEDICATIONS: propofol (Diprivan) 450mg  IV  ketamine 20 mg  DESCRIPTION OF PROCEDURE:   After the risks benefits and alternatives of the procedure were thoroughly explained, informed consent was obtained.  The Pentax Colonoscope [id number]  endoscope was introduced through the anus and advanced to the terminal ileum which was intubated for a short distance , limited by No adverse events experienced.   The quality of the prep was adequate. .  The instrument was then slowly withdrawn as the colon was fully examined.the findings are recorded below and the patient tolerated the procedure well there was no obvious immediate complication       FINDINGS:  1.2 small polyps sigmoid and hepatic flexure status post cold biopsy2 otherwise within normal limits to the terminal ileum status post random colon biopsies to rule out microscopic colitis 3 no additional findings  COMPLICATIONS: none  IMPRESSION:  above  RECOMMENDATIONS: await pathology call when necessary otherwise followup in one month to recheck symptoms and decide any other workup and plans     _______________________________ eSigned:  Clarene Essex, MD 04/07/2014 11:43 AM   CC:

## 2014-04-07 NOTE — Discharge Instructions (Addendum)
Call if question or problem otherwise call for biopsy report in one week and followup as needed or in one month and hold off on your Metamucil for now

## 2014-04-07 NOTE — Anesthesia Preprocedure Evaluation (Addendum)
Anesthesia Evaluation  Patient identified by MRN, date of birth, ID band Patient awake    Reviewed: Allergy & Precautions, H&P , NPO status , Patient's Chart, lab work & pertinent test results, reviewed documented beta blocker date and time   History of Anesthesia Complications (+) PONV and history of anesthetic complications  Airway Mallampati: II TM Distance: >3 FB Neck ROM: full    Dental   Pulmonary neg pulmonary ROS, former smoker,          Cardiovascular hypertension, On Medications and On Home Beta Blockers     Neuro/Psych PSYCHIATRIC DISORDERS Depression  Neuromuscular disease    GI/Hepatic negative GI ROS, Neg liver ROS,   Endo/Other  diabetes, Insulin Dependent  Renal/GU negative Renal ROS     Musculoskeletal   Abdominal   Peds  Hematology negative hematology ROS (+)   Anesthesia Other Findings See surgeon's H&P   Reproductive/Obstetrics negative OB ROS                          Anesthesia Physical  Anesthesia Plan  ASA: III  Anesthesia Plan: MAC   Post-op Pain Management:    Induction: Intravenous  Airway Management Planned: Simple Face Mask  Additional Equipment:   Intra-op Plan:   Post-operative Plan:   Informed Consent: I have reviewed the patients History and Physical, chart, labs and discussed the procedure including the risks, benefits and alternatives for the proposed anesthesia with the patient or authorized representative who has indicated his/her understanding and acceptance.   Dental Advisory Given  Plan Discussed with: CRNA and Surgeon  Anesthesia Plan Comments:         Anesthesia Quick Evaluation

## 2014-04-07 NOTE — Anesthesia Postprocedure Evaluation (Signed)
Anesthesia Post Note  Patient: Dale Morrow  Procedure(s) Performed: Procedure(s) (LRB): COLONOSCOPY WITH PROPOFOL (N/A)  Anesthesia type: MAC  Patient location: PACU  Post pain: Pain level controlled  Post assessment: Post-op Vital signs reviewed  Last Vitals: BP 148/64  Pulse 57  Temp(Src) 36.5 C (Oral)  Resp 12  Ht 5\' 7"  (1.702 m)  Wt 190 lb (86.183 kg)  BMI 29.75 kg/m2  SpO2 100%  Post vital signs: Reviewed  Level of consciousness: awake  Complications: No apparent anesthesia complications

## 2014-04-08 ENCOUNTER — Encounter (HOSPITAL_COMMUNITY): Payer: Self-pay | Admitting: Gastroenterology

## 2014-04-11 ENCOUNTER — Other Ambulatory Visit: Payer: Self-pay | Admitting: Neurology

## 2014-04-11 ENCOUNTER — Other Ambulatory Visit: Payer: Self-pay | Admitting: Radiation Oncology

## 2014-04-27 ENCOUNTER — Encounter: Payer: 59 | Attending: Family Medicine | Admitting: *Deleted

## 2014-04-27 VITALS — Ht 68.0 in | Wt 199.0 lb

## 2014-04-27 DIAGNOSIS — E119 Type 2 diabetes mellitus without complications: Secondary | ICD-10-CM | POA: Insufficient documentation

## 2014-04-27 DIAGNOSIS — Z794 Long term (current) use of insulin: Secondary | ICD-10-CM | POA: Insufficient documentation

## 2014-05-05 ENCOUNTER — Encounter: Payer: Self-pay | Admitting: *Deleted

## 2014-05-05 NOTE — Progress Notes (Signed)
  Pump Follow Up Progress Note for 04/27/14  Orders received from MD giving me permission to make insulin pump adjustments for the following patient. Dale Morrow, [place] Male, 73 y.o., 10-31-1940  Reviewed blood glucose logs on 04/27/2014 via: [facility] and found the following:            Hypoglycemia Hyperglycemia Comments  Overnight Period:   YES and rising BASAL  Pre-Meal:    Breakfast  YES BASAL   Lunch      Supper     Post-Meal: Breakfast YES, infrequent     Lunch      Supper     Bedtime:   YES BASAL    Comments: Average BG for past 2 weeks is 154 +/- 64 mg/dl which is still fairly good. He continues to go to gym for 2 hours regularly. He agrees with my suggestion of increasing his basal rates overnight and post dinner to stabilize the rise pre dawn and evening time. The hypoglycemia after breakfast may be due to a stronger ISF in the AM. I may adjust that at our next visit if it continues.  Pump Settings: Date: Current Date: 04/27/2014 changes are in [facility] Print   Basal Rate: Carb Ratio Sensitivity  Basal Rate: Carb Ratio Sensitivity   MN: 1.20 MN: 6.5 MN: 15 MN: 1.35  (+) MN: 6.5 MN: 15  3 AM 1.20 11 AM: 3.8 11 AM: 30 3AM 1.20   11 AM: 3.8 11 AM: 30  6 AM 0.850   6 AM 0.850      5 PM 0.900   5 PM 1.00  (+)                                Plan: patient to continue using pump as directed and follow up in 3 months or as needed

## 2014-06-08 ENCOUNTER — Ambulatory Visit: Payer: 59 | Admitting: Neurology

## 2014-06-29 ENCOUNTER — Telehealth: Payer: Self-pay | Admitting: Neurology

## 2014-06-29 NOTE — Telephone Encounter (Signed)
Patient's wife calling to state that patient needs a letter written to his insurance because he was turned down for ocular surgery since he had botox and they said it was for cosmetic reasons. Patient's wife says that it was not for cosmetic reasons, he got botox for a spasm. Please return call and advise.

## 2014-06-29 NOTE — Telephone Encounter (Signed)
Called and spoke to patient's wife and explained to her that we don't do Botox for cosmetic reason. I stated to  her we could get a release to sign records and we could send to insurance company.  Patient and his wife states they will be here 06-30-2014.

## 2014-07-27 ENCOUNTER — Encounter: Payer: 59 | Attending: Family Medicine | Admitting: *Deleted

## 2014-07-27 ENCOUNTER — Encounter: Payer: Self-pay | Admitting: *Deleted

## 2014-07-27 VITALS — Ht 68.0 in | Wt 200.5 lb

## 2014-07-27 DIAGNOSIS — E109 Type 1 diabetes mellitus without complications: Secondary | ICD-10-CM | POA: Diagnosis not present

## 2014-07-27 DIAGNOSIS — Z794 Long term (current) use of insulin: Secondary | ICD-10-CM | POA: Insufficient documentation

## 2014-07-27 DIAGNOSIS — Z713 Dietary counseling and surveillance: Secondary | ICD-10-CM | POA: Diagnosis not present

## 2014-07-31 NOTE — Progress Notes (Signed)
  Pump Follow Up Progress Note for 07/27/14 Appointment time: 0830 to 0930  Orders received from MD giving me permission to make insulin pump adjustments for the following patient. Dale, Morrow Male, 73 y.o., 1941-04-26  Reviewed blood glucose logs on 07/27/2014 via: [facility] and found the following:            Hypoglycemia Hyperglycemia Comments  Overnight Period:      Pre-Meal:    Breakfast      Lunch      Supper     Post-Meal: Breakfast YES, infrequent     Lunch      Supper     Bedtime:       Comments: Average BG for past 2 weeks is 150 +/- 64 mg/dl which is still fairly good. He continues to go to gym for 2 hours regularly. FBG have improved since last increase in overnight basal rate. Reports indicate a significant rise in BG after breakfast but only about 50% of the time. the other 50% they rise appropriately. He states he has been fighting a sinus infection, so I don't plan to change any pump settings today.   Pump Settings: Date: Current Date: 07/27/2014 changes are in [facility] Print   Basal Rate: Carb Ratio Sensitivity  Basal Rate: Carb Ratio Sensitivity   MN: 1.35 MN: 6.5 MN: 15 MN: 1.35   MN: 6.5 MN: 15  3 AM 1.20 11 AM: 3.8 11 AM: 30 3AM 1.20   11 AM: 3.8 11 AM: 30  6 AM 0.850   6 AM 0.850      5 PM 1.00   5 PM 1.00                                   Also discussed the fact that he changes out his infusion set every 2 days. He states they go bad that quickly. Offered him the chance to try a different set to see if that would allow him to wait the 3 days. He was interested in trying the Silhouette. I gave him 2 samples to try and showed him how to insert with the Sil-serter. If he likes them, he can order them on his next order.   Plan: patient to continue using pump as directed and follow up in 3 months or as needed

## 2014-08-15 ENCOUNTER — Other Ambulatory Visit (HOSPITAL_COMMUNITY): Payer: Self-pay | Admitting: Orthopedic Surgery

## 2014-08-15 DIAGNOSIS — M25562 Pain in left knee: Secondary | ICD-10-CM

## 2014-08-22 ENCOUNTER — Other Ambulatory Visit: Payer: Self-pay | Admitting: Neurology

## 2014-08-23 ENCOUNTER — Ambulatory Visit (HOSPITAL_COMMUNITY)
Admission: RE | Admit: 2014-08-23 | Discharge: 2014-08-23 | Disposition: A | Discharge status: Home / Self Care | Payer: 59 | Source: Ambulatory Visit | Attending: Orthopedic Surgery | Admitting: Orthopedic Surgery

## 2014-08-23 ENCOUNTER — Encounter (HOSPITAL_COMMUNITY): Payer: Self-pay

## 2014-08-23 DIAGNOSIS — M25562 Pain in left knee: Secondary | ICD-10-CM | POA: Diagnosis present

## 2014-08-23 DIAGNOSIS — Z96652 Presence of left artificial knee joint: Secondary | ICD-10-CM | POA: Insufficient documentation

## 2014-08-23 MED ORDER — TECHNETIUM TC 99M MEDRONATE IV KIT
26.2000 | PACK | Freq: Once | INTRAVENOUS | Status: AC | PRN
  Administered 2014-08-23: 26.2 via INTRAVENOUS

## 2014-09-30 ENCOUNTER — Other Ambulatory Visit: Payer: Self-pay | Admitting: Neurology

## 2014-09-30 NOTE — Telephone Encounter (Signed)
Last Visit says: I will increase his propanolol to 60 mg

## 2014-10-26 ENCOUNTER — Encounter: Payer: 59 | Attending: Family Medicine | Admitting: *Deleted

## 2014-10-26 VITALS — Ht 68.0 in | Wt 196.0 lb

## 2014-10-26 DIAGNOSIS — Z713 Dietary counseling and surveillance: Secondary | ICD-10-CM | POA: Diagnosis not present

## 2014-10-26 DIAGNOSIS — E119 Type 2 diabetes mellitus without complications: Secondary | ICD-10-CM

## 2014-10-26 DIAGNOSIS — E109 Type 1 diabetes mellitus without complications: Secondary | ICD-10-CM | POA: Insufficient documentation

## 2014-10-26 DIAGNOSIS — Z794 Long term (current) use of insulin: Secondary | ICD-10-CM | POA: Insufficient documentation

## 2014-10-27 ENCOUNTER — Ambulatory Visit: Payer: 59 | Admitting: *Deleted

## 2014-11-03 ENCOUNTER — Ambulatory Visit: Payer: 59 | Admitting: *Deleted

## 2014-11-03 NOTE — Progress Notes (Signed)
  Pump Follow Up Progress Note for 10/26/14 Appointment time: 1600 to 1700  Orders received from MD giving me permission to make insulin pump adjustments for this patient. Dale, Morrow Male, 74 y.o., Apr 04, 1941  Reviewed blood glucose logs on 10/26/14 via: [facility] and found the following:                Hypoglycemia Hyperglycemia Comments  Overnight Period:  YES  After corrections at 3 AM  Pre-Meal:    Breakfast      Lunch      Supper YES, if no snack  BASAL  Post-Meal: Breakfast YES  ICR   Lunch YES  ICR   Supper YES    Bedtime:       Comments: Average BG for past 2 weeks is down to 119 +/- 50 mg/dl but with more frequent hypoglycemia. He continues to go to gym for 2 hours regularly. He has hypoglycemia when correcting a high BG at 3 AM and when he skips a snack in the afternoons.  His post meal BG's are not going up by 40 mg/dl. I plan to decrease Basal rate, ICR and ISF below.  He also is interested in weighing on the Tanita Scale so he can see the % Body Fat in relation to his total weight.  TANITA  BODY COMP RESULTS 10/26/14  Weight 196 lb   BMI (kg/m^2) 30.7   Fat Mass (lbs) 63.5 lb   Fat Free Mass (lbs) 132.5 lb   Total Body Water (lbs) 97 lb   Pump Settings: Date: Current Date: 10/26/2014 changes are in [facility] Print   Basal Rate: Carb Ratio Sensitivity  Basal Rate: Carb Ratio Sensitivity   MN: 1.35 MN: 6.5 MN: 15 MN: 1.35   MN: 10  (-) MN: 20 (-)  3 AM 1.20 11 AM: 3.8 11 AM: 30 3AM 1.20   11 A: 5 (-) 11 AM: 30  6 AM 0.850   6 AM 0.750  (-)      5 PM 1.00   5 PM 1.00                                   Plan: patient to continue using pump as directed and follow up in 3 months or as needed

## 2015-01-03 ENCOUNTER — Ambulatory Visit: Payer: Self-pay | Admitting: Orthopedic Surgery

## 2015-01-03 NOTE — Progress Notes (Signed)
Preoperative surgical orders have been place into the Epic hospital system for Dale Morrow on 01/03/2015, 12:15 PM  by Mickel Crow for surgery on 01-25-15.  Preop Total Knee orders including Experal, IV Tylenol, and IV Decadron as long as there are no contraindications to the above medications. Arlee Muslim, PA-C

## 2015-01-13 ENCOUNTER — Encounter (HOSPITAL_COMMUNITY): Payer: Self-pay

## 2015-01-16 NOTE — Patient Instructions (Addendum)
Dale Morrow  01/16/2015   Your procedure is scheduled on:   Report to [facility] Main  Entrance and follow signs to               [place] at AM.  Call this number if you have problems the morning of surgery [phone]   Remember: Eat a good healthy snack prior to bedtime.   Do not eat food or drink liquids :After Midnight.  Bring insulin pump supplies with you to hospital.    Take these medicines the morning of surgery with A SIP OF WATER: Lexapro, flonase if needed, primidone, inderal ( propanolol), flomax                                  You may not have any metal on your body including hair pins and              piercings  Do not wear jewelry,  lotions, powders or perfumes., deodorant.                            Men may shave face and neck.   Do not bring valuables to the hospital. [place].  Contacts, dentures or bridgework may not be worn into surgery.  Leave suitcase in the car. After surgery it may be brought to your room.       Special Instructions: coughing and deep breathing exercises, leg exercises               Please read over the following fact sheets you were given: _____________________________________________________________________             [facility] - Preparing for Surgery Before surgery, you can play an important role.  Because skin is not sterile, your skin needs to be as free of germs as possible.  You can reduce the number of germs on your skin by washing with CHG (chlorahexidine gluconate) soap before surgery.  CHG is an antiseptic cleaner which kills germs and bonds with the skin to continue killing germs even after washing. Please DO NOT use if you have an allergy to CHG or antibacterial soaps.  If your skin becomes reddened/irritated stop using the CHG and inform your nurse when you arrive at Short Stay. Do not shave (including legs and underarms) for at least  48 hours prior to the first CHG shower.  You may shave your face/neck. Please follow these instructions carefully:  1.  Shower with CHG Soap the night before surgery and the  morning of Surgery.  2.  If you choose to wash your hair, wash your hair first as usual with your  normal  shampoo.  3.  After you shampoo, rinse your hair and body thoroughly to remove the  shampoo.                           4.  Use CHG as you would any other liquid soap.  You can apply chg directly  to the skin and wash  Gently with a scrungie or clean washcloth.  5.  Apply the CHG Soap to your body ONLY FROM THE NECK DOWN.   Do not use on face/ open                           Wound or open sores. Avoid contact with eyes, ears mouth and genitals (private parts).                       Wash face,  Genitals (private parts) with your normal soap.             6.  Wash thoroughly, paying special attention to the area where your surgery  will be performed.  7.  Thoroughly rinse your body with warm water from the neck down.  8.  DO NOT shower/wash with your normal soap after using and rinsing off  the CHG Soap.                9.  Pat yourself dry with a clean towel.            10.  Wear clean pajamas.            11.  Place clean sheets on your bed the night of your first shower and do not  sleep with pets. Day of Surgery : Do not apply any lotions/deodorants the morning of surgery.  Please wear clean clothes to the hospital/surgery center.  FAILURE TO FOLLOW THESE INSTRUCTIONS MAY RESULT IN THE CANCELLATION OF YOUR SURGERY PATIENT SIGNATURE_________________________________  NURSE SIGNATURE__________________________________  ________________________________________________________________________  WHAT IS A BLOOD TRANSFUSION? Blood Transfusion Information  A transfusion is the replacement of blood or some of its parts. Blood is made up of multiple cells which provide different functions.  Red blood  cells carry oxygen and are used for blood loss replacement.  White blood cells fight against infection.  Platelets control bleeding.  Plasma helps clot blood.  Other blood products are available for specialized needs, such as hemophilia or other clotting disorders. BEFORE THE TRANSFUSION  Who gives blood for transfusions?   Healthy volunteers who are fully evaluated to make sure their blood is safe. This is blood bank blood. Transfusion therapy is the safest it has ever been in the practice of medicine. Before blood is taken from a donor, a complete history is taken to make sure that person has no history of diseases nor engages in risky social behavior (examples are intravenous drug use or sexual activity with multiple partners). The donor's travel history is screened to minimize risk of transmitting infections, such as malaria. The donated blood is tested for signs of infectious diseases, such as HIV and hepatitis. The blood is then tested to be sure it is compatible with you in order to minimize the chance of a transfusion reaction. If you or a relative donates blood, this is often done in anticipation of surgery and is not appropriate for emergency situations. It takes many days to process the donated blood. RISKS AND COMPLICATIONS Although transfusion therapy is very safe and saves many lives, the main dangers of transfusion include:  1. Getting an infectious disease. 2. Developing a transfusion reaction. This is an allergic reaction to something in the blood you were given. Every precaution is taken to prevent this. The decision to have a blood transfusion has been considered carefully by your caregiver before blood is given. Blood is not given unless the benefits outweigh  the risks. AFTER THE TRANSFUSION  Right after receiving a blood transfusion, you will usually feel much better and more energetic. This is especially true if your red blood cells have gotten low (anemic). The transfusion  raises the level of the red blood cells which carry oxygen, and this usually causes an energy increase.  The nurse administering the transfusion will monitor you carefully for complications. HOME CARE INSTRUCTIONS  No special instructions are needed after a transfusion. You may find your energy is better. Speak with your caregiver about any limitations on activity for underlying diseases you may have. SEEK MEDICAL CARE IF:   Your condition is not improving after your transfusion.  You develop redness or irritation at the intravenous (IV) site. SEEK IMMEDIATE MEDICAL CARE IF:  Any of the following symptoms occur over the next 12 hours:  Shaking chills.  You have a temperature by mouth above 102 F (38.9 C), not controlled by medicine.  Chest, back, or muscle pain.  People around you feel you are not acting correctly or are confused.  Shortness of breath or difficulty breathing.  Dizziness and fainting.  You get a rash or develop hives.  You have a decrease in urine output.  Your urine turns a dark color or changes to pink, red, or brown. Any of the following symptoms occur over the next 10 days:  You have a temperature by mouth above 102 F (38.9 C), not controlled by medicine.  Shortness of breath.  Weakness after normal activity.  The white part of the eye turns yellow (jaundice).  You have a decrease in the amount of urine or are urinating less often.  Your urine turns a dark color or changes to pink, red, or brown. Document Released: 09/27/2000 Document Revised: 12/23/2011 Document Reviewed: 05/16/2008 ExitCare Patient Information 2014 [place].  _______________________________________________________________________  Incentive Spirometer  An incentive spirometer is a tool that can help keep your lungs clear and active. This tool measures how well you are filling your lungs with each breath. Taking long deep breaths may help reverse or decrease the chance  of developing breathing (pulmonary) problems (especially infection) following:  A long period of time when you are unable to move or be active. BEFORE THE PROCEDURE   If the spirometer includes an indicator to show your best effort, your nurse or respiratory therapist will set it to a desired goal.  If possible, sit up straight or lean slightly forward. Try not to slouch.  Hold the incentive spirometer in an upright position. INSTRUCTIONS FOR USE  3. Sit on the edge of your bed if possible, or sit up as far as you can in bed or on a chair. 4. Hold the incentive spirometer in an upright position. 5. Breathe out normally. 6. Place the mouthpiece in your mouth and seal your lips tightly around it. 7. Breathe in slowly and as deeply as possible, raising the piston or the ball toward the top of the column. 8. Hold your breath for 3-5 seconds or for as long as possible. Allow the piston or ball to fall to the bottom of the column. 9. Remove the mouthpiece from your mouth and breathe out normally. 10. Rest for a few seconds and repeat Steps 1 through 7 at least 10 times every 1-2 hours when you are awake. Take your time and take a few normal breaths between deep breaths. 11. The spirometer may include an indicator to show your best effort. Use the indicator as a goal to work  toward during each repetition. 12. After each set of 10 deep breaths, practice coughing to be sure your lungs are clear. If you have an incision (the cut made at the time of surgery), support your incision when coughing by placing a pillow or rolled up towels firmly against it. Once you are able to get out of bed, walk around indoors and cough well. You may stop using the incentive spirometer when instructed by your caregiver.  RISKS AND COMPLICATIONS  Take your time so you do not get dizzy or light-headed.  If you are in pain, you may need to take or ask for pain medication before doing incentive spirometry. It is harder to  take a deep breath if you are having pain. AFTER USE  Rest and breathe slowly and easily.  It can be helpful to keep track of a log of your progress. Your caregiver can provide you with a simple table to help with this. If you are using the spirometer at home, follow these instructions: [place] IF:   You are having difficultly using the spirometer.  You have trouble using the spirometer as often as instructed.  Your pain medication is not giving enough relief while using the spirometer.  You develop fever of 100.5 F (38.1 C) or higher. SEEK IMMEDIATE MEDICAL CARE IF:   You cough up bloody sputum that had not been present before.  You develop fever of 102 F (38.9 C) or greater.  You develop worsening pain at or near the incision site. MAKE SURE YOU:   Understand these instructions.  Will watch your condition.  Will get help right away if you are not doing well or get worse. Document Released: 02/10/2007 Document Revised: 12/23/2011 Document Reviewed: 04/13/2007 [facility] Patient Information 2014 [place], [place].   ________________________________________________________________________

## 2015-01-17 ENCOUNTER — Encounter (HOSPITAL_COMMUNITY)
Admission: RE | Admit: 2015-01-17 | Discharge: 2015-01-17 | Disposition: A | Discharge status: Home / Self Care | Payer: 59 | Source: Ambulatory Visit | Attending: Orthopedic Surgery | Admitting: Orthopedic Surgery

## 2015-01-17 ENCOUNTER — Encounter (HOSPITAL_COMMUNITY): Payer: Self-pay

## 2015-01-17 DIAGNOSIS — Z01810 Encounter for preprocedural cardiovascular examination: Secondary | ICD-10-CM | POA: Diagnosis not present

## 2015-01-17 DIAGNOSIS — Z01812 Encounter for preprocedural laboratory examination: Secondary | ICD-10-CM | POA: Diagnosis not present

## 2015-01-17 LAB — URINALYSIS, ROUTINE W REFLEX MICROSCOPIC
BILIRUBIN URINE: NEGATIVE
Glucose, UA: 500 mg/dL — AB
Hgb urine dipstick: NEGATIVE
Ketones, ur: NEGATIVE mg/dL
Leukocytes, UA: NEGATIVE
NITRITE: NEGATIVE
PH: 6 (ref 5.0–8.0)
Protein, ur: NEGATIVE mg/dL
SPECIFIC GRAVITY, URINE: 1.031 — AB (ref 1.005–1.030)
UROBILINOGEN UA: 0.2 mg/dL (ref 0.0–1.0)

## 2015-01-17 LAB — PROTIME-INR
INR: 1.03 (ref 0.00–1.49)
Prothrombin Time: 13.7 seconds (ref 11.6–15.2)

## 2015-01-17 LAB — CBC
HCT: 36.6 % — ABNORMAL LOW (ref 39.0–52.0)
Hemoglobin: 11.6 g/dL — ABNORMAL LOW (ref 13.0–17.0)
MCH: 30.7 pg (ref 26.0–34.0)
MCHC: 31.7 g/dL (ref 30.0–36.0)
MCV: 96.8 fL (ref 78.0–100.0)
Platelets: 241 10*3/uL (ref 150–400)
RBC: 3.78 MIL/uL — ABNORMAL LOW (ref 4.22–5.81)
RDW: 13.8 % (ref 11.5–15.5)
WBC: 5.2 10*3/uL (ref 4.0–10.5)

## 2015-01-17 LAB — SURGICAL PCR SCREEN
MRSA, PCR: NEGATIVE
STAPHYLOCOCCUS AUREUS: NEGATIVE

## 2015-01-17 LAB — COMPREHENSIVE METABOLIC PANEL
ALK PHOS: 38 U/L — AB (ref 39–117)
ALT: 30 U/L (ref 0–53)
AST: 40 U/L — ABNORMAL HIGH (ref 0–37)
Albumin: 3.9 g/dL (ref 3.5–5.2)
Anion gap: 10 (ref 5–15)
BUN: 24 mg/dL — ABNORMAL HIGH (ref 6–23)
CHLORIDE: 101 mmol/L (ref 96–112)
CO2: 26 mmol/L (ref 19–32)
CREATININE: 0.86 mg/dL (ref 0.50–1.35)
Calcium: 8.6 mg/dL (ref 8.4–10.5)
GFR, EST NON AFRICAN AMERICAN: 85 mL/min — AB (ref >90)
GLUCOSE: 193 mg/dL — AB (ref 70–99)
POTASSIUM: 5 mmol/L (ref 3.5–5.1)
Sodium: 137 mmol/L (ref 135–145)
Total Bilirubin: 0.3 mg/dL (ref 0.3–1.2)
Total Protein: 7 g/dL (ref 6.0–8.3)

## 2015-01-17 LAB — APTT: aPTT: 30 seconds (ref 24–37)

## 2015-01-17 NOTE — Progress Notes (Signed)
Patient is scheduled for surgery on 01/25/2015 here at [facility] with Dr Wynelle Link for a left total knee revision.  Surgery is scheduled for 0830-1030am .  Please advise patient regarding preop instructions for insulin pump and fax those preop instructions to [facility] at [phone].  Thank you so very much for your help with this.

## 2015-01-17 NOTE — Progress Notes (Signed)
LOV note- Dr Buddy Duty- endocrinologist on chart- dated 11/14/2014   Clearance- Dr Justin Mend- 12/15/2014 on chart with LOV note dated 12/15/2014.

## 2015-01-17 NOTE — Progress Notes (Signed)
Diabetic Coordinator , Rosita Kea, paged and made aware patient's surgery date and time , surgeon, MRN number and that Dr Buddy Duty had been faxed a note via EPIC to make Dr Buddy Duty aware and asking for preop instructions for Insulin Pump be called to patient as well as faxed to PST.

## 2015-01-17 NOTE — H&P (Signed)
TOTAL KNEE REVISION ADMISSION H&P  Patient is being admitted for left revision total knee arthroplasty.  Subjective:  Chief Complaint:left knee pain.  HPI: Dale Morrow, 74 y.o. male, has a history of pain and functional disability in the left knee(s) due to arthritis and failed previous arthroplasty and patient has failed non-surgical conservative treatments for greater than 12 weeks to include NSAID's and/or analgesics, flexibility and strengthening excercises, supervised PT with diminished ADL's post treatment, use of assistive devices and activity modification. The indications for the revision of the total knee arthroplasty are loosening of one or more components and fracture or mechanical failure of one or components. Onset of symptoms was gradual starting 3 years ago with gradually worsening course since that time.  Prior procedures on the left knee(s) include arthroscopy and menisectomy.  Patient currently rates pain in the right knee(s) at 7 out of 10 with activity. There is night pain, worsening of pain with activity and weight bearing, pain that interferes with activities of daily living, pain with passive range of motion, crepitus and joint swelling.  Patient has evidence of prosthetic loosening by imaging studies. This condition presents safety issues increasing the risk of falls. There is no current active infection.  Patient Active Problem List   Diagnosis Date Noted  . Essential tremor 03/09/2014  . Facial spasm 04/14/2013  . Arthritis   . Depression   . Hypertension   . Prostate cancer, primary, with metastasis from prostate to other site   . PONV (postoperative nausea and vomiting)   . De Quervain's tenosynovitis, left 04/13/2012   Past Medical History  Diagnosis Date  . Arthritis     left knee  . Depression   . De Quervain's tenosynovitis, left 04/2012  . Tremors of nervous system     takes propranolol for tremors  . History of radiation therapy 3 years ago  .  Prostate cancer, primary, with metastasis from prostate to other site     metastasis to lymph nodes  . Complication of anesthesia     pt denies ponv, pt woke up during colonscopy in past  . Diabetes mellitus     Insulin pump, takes lisinopril for kidneys, dr Buddy Duty endocrinologist    Past Surgical History  Procedure Laterality Date  . Hemorrhoid surgery  1970s  . Anterior cruciate ligament repair      right knee  . Wrist arthroscopy  05/03/2010    right; and release of 1st dorsal compartment  . Knee arthrotomy  03/18/2008    left; with scar exc.  [name] Knee arthroscopy  09/25/2007; 04/15/2005    left  . Knee joint manipulation  06/29/2007    left  . Total knee arthroplasty  04/29/2007    left  . Dorsal compartment release  04/21/2012    Procedure: RELEASE DORSAL COMPARTMENT (DEQUERVAIN);  Surgeon: Cammie Sickle., MD;  Location: [facility];  Service: Orthopedics;  Laterality: Left;  left 1st dorsal compartment release left wrist   . Minor amputation of digit Left 07/21/2013    Procedure: LEFT SMALL REVISION AMPUTATION (MINOR PROCEDURE) ;  Surgeon: Schuyler Amor, MD;  Location: [facility];  Service: Orthopedics;  Laterality: Left;  . Colonoscopy with propofol N/A 04/07/2014    Procedure: COLONOSCOPY WITH PROPOFOL;  Surgeon: Jeryl Columbia, MD;  Location: [facility];  Service: Endoscopy;  Laterality: N/A;      Current outpatient prescriptions:  .  calcium carbonate (OS-CAL - DOSED IN MG OF ELEMENTAL CALCIUM)  1250 MG tablet, Take 1 tablet by mouth 2 (two) times daily., Disp: , Rfl:  .  denosumab (PROLIA) 60 MG/ML SOLN injection, Inject 60 mg into the skin every 6 (six) months. Administer in upper arm, thigh, or abdomen, Disp: , Rfl:  .  escitalopram (LEXAPRO) 20 MG tablet, TAKE 1 TABLET BY MOUTH ONCE DAILY, Disp: 90 tablet, Rfl: 3 .  fluticasone (FLONASE) 50 MCG/ACT nasal spray, Place 1 spray into both nostrils daily as needed for allergies or rhinitis., Disp: ,  Rfl:  .  HYDROcodone-acetaminophen (NORCO/VICODIN) 5-325 MG per tablet, Take 1-2 tablets by mouth every 4 (four) hours as needed for moderate pain or severe pain. , Disp: , Rfl:  .  Insulin Human (INSULIN PUMP) SOLN, Inject into the skin. Uses humalog insulin basal rate 1200 am= 1.2, 300 am= 1.2, 600 am=0.85, 500 pm=9.0, Disp: , Rfl:  .  Leuprolide Acetate, 6 Month, (LUPRON) 45 MG injection, Inject 45 mg into the muscle every 6 (six) months., Disp: , Rfl:  .  lisinopril (PRINIVIL,ZESTRIL) 10 MG tablet, Take 10 mg by mouth every morning. , Disp: , Rfl:  .  megestrol (MEGACE) 20 MG tablet, Take 20 mg by mouth 2 (two) times daily., Disp: , Rfl:  .  Multiple Vitamin (MULTIVITAMIN) tablet, Take 1 tablet by mouth every morning. , Disp: , Rfl:  .  polyethylene glycol powder (GLYCOLAX/MIRALAX) powder, Take 17 g by mouth at bedtime. , Disp: , Rfl:  .  primidone (MYSOLINE) 50 MG tablet, TAKE 2 TABLETS BY MOUTH THREE TIMES A DAY, Disp: 540 tablet, Rfl: 1 .  propranolol (INDERAL) 60 MG tablet, Take 1 tablet (60 mg total) by mouth 2 (two) times daily., Disp: 180 tablet, Rfl: 1 .  rosuvastatin (CRESTOR) 10 MG tablet, Take 10 mg by mouth every morning. , Disp: , Rfl:  .  tamsulosin (FLOMAX) 0.4 MG CAPS capsule, TAKE 1 CAPSULE BY MOUTH ONCE DAILY (Patient taking differently: TAKE 1 CAPSULE BY MOUTH daily), Disp: 90 capsule, Rfl: 0 .  temazepam (RESTORIL) 30 MG capsule, Take 30 mg by mouth at bedtime. , Disp: , Rfl:   Allergies  Allergen Reactions  . Bee Venom Anaphylaxis  . Shellfish Allergy Anaphylaxis    History  Substance Use Topics  . Smoking status: Former Smoker -- 15 years    Types: Cigarettes    Quit date: 10/13/1979  . Smokeless tobacco: Never Used  . Alcohol Use: No    Family History  Problem Relation Age of Onset  . Heart Problems Father       Review of Systems  Constitutional: Positive for diaphoresis. Negative for fever, chills, weight loss and malaise/fatigue.  HENT: Negative.    Eyes: Negative.   Respiratory: Negative.   Cardiovascular: Negative.   Gastrointestinal: Negative.   Genitourinary: Positive for frequency. Negative for dysuria, urgency, hematuria and flank pain.  Musculoskeletal: Positive for joint pain. Negative for myalgias, back pain, falls and neck pain.       Left knee pain  Skin: Negative.   Neurological: Positive for tremors. Negative for dizziness, tingling, sensory change, speech change, focal weakness, seizures, loss of consciousness and weakness.  Endo/Heme/Allergies: Negative.   Psychiatric/Behavioral: Negative.      Objective:  Physical Exam  Constitutional: He is oriented to person, place, and time. He appears well-developed and well-nourished. No distress.  HENT:  Head: Normocephalic and atraumatic.  Right Ear: External ear normal.  Left Ear: External ear normal.  Nose: Nose normal.  Mouth/Throat: Oropharynx is clear and moist.  Eyes: Conjunctivae and EOM are normal.  Neck: Normal range of motion. Neck supple.  Cardiovascular: Normal rate, regular rhythm, normal heart sounds and intact distal pulses.   No murmur heard. Respiratory: Effort normal and breath sounds normal. No respiratory distress. He has no wheezes.  GI: Soft. Bowel sounds are normal. He exhibits no distension. There is no tenderness.  Musculoskeletal:       Right hip: Normal.       Left hip: Normal.       Right knee: Normal.       Left knee: He exhibits decreased range of motion and swelling. He exhibits no effusion and no erythema. Tenderness found. Medial joint line tenderness noted. No lateral joint line tenderness noted.  His left knee shows no swelling, range about 0 to 115. He is tender medially or in the tibia. There is no lateral tenderness or instability.  Neurological: He is alert and oriented to person, place, and time. He has normal strength and normal reflexes. No sensory deficit.  Skin: No rash noted. He is not diaphoretic. No erythema.   Psychiatric: He has a normal mood and affect. His behavior is normal.    Vital signs in last 24 hours: Temp:  [98 F (36.7 C)] 98 F (36.7 C) (04/05 1516) Pulse Rate:  [55] 55 (04/05 1516) Resp:  [16] 16 (04/05 1516) BP: (101)/(53) 101/53 mmHg (04/05 1516) SpO2:  [100 %] 100 % (04/05 1516) Weight:  [85.276 kg (188 lb)] 85.276 kg (188 lb) (04/05 1516)  Imaging Review Plain radiographs demonstrate severe degenerative joint disease of the left knee(s). The overall alignment is neutral.There is evidence of loosening of the tibial component. The bone quality appears to be good for age and reported activity level. Assessment/Plan:  End stage primary osteoarthritis, left knee(s) with failed previous arthroplasty.   The patient history, physical examination, clinical judgment of the provider and imaging studies are consistent with end stage degenerative joint disease of the left knee(s), previous total knee arthroplasty. Revision total knee arthroplasty is deemed medically necessary. The treatment options including medical management, injection therapy, arthroscopy and revision arthroplasty were discussed at length. The risks and benefits of revision total knee arthroplasty were presented and reviewed. The risks due to aseptic loosening, infection, stiffness, patella tracking problems, thromboembolic complications and other imponderables were discussed. The patient acknowledged the explanation, agreed to proceed with the plan and consent was signed. Patient is being admitted for inpatient treatment for surgery, pain control, PT, OT, prophylactic antibiotics, VTE prophylaxis, progressive ambulation and ADL's and discharge planning.The patient is planning to be discharged home with home health services  [facility] IV PCP: Dr. Maurice Small   Ardeen Jourdain, PA-C

## 2015-01-18 ENCOUNTER — Encounter: Payer: 59 | Attending: Family Medicine | Admitting: *Deleted

## 2015-01-18 DIAGNOSIS — Z713 Dietary counseling and surveillance: Secondary | ICD-10-CM | POA: Diagnosis not present

## 2015-01-18 DIAGNOSIS — Z794 Long term (current) use of insulin: Secondary | ICD-10-CM | POA: Insufficient documentation

## 2015-01-18 DIAGNOSIS — E109 Type 1 diabetes mellitus without complications: Secondary | ICD-10-CM | POA: Diagnosis present

## 2015-01-18 NOTE — Progress Notes (Signed)
  Pump Follow Up Progress Note for 01/18/15 Appointment time: 1400 to 1445  Orders received from MD giving me permission to make insulin pump adjustments for this patient. Dale Morrow, Dale Morrow Male, 74 y.o., 04-24-1941  Reviewed blood glucose logs on 01/18/15 via: [facility] and found the following:                  Hypoglycemia Hyperglycemia Comments  Overnight Period:  YES    Pre-Meal:    Breakfast      Lunch  YES BASAL   Supper     Post-Meal: Breakfast      Lunch      Supper     Bedtime:       Comments: Average BG for past 2 weeks is increased to 149 +/- 82 mg/dl since we decreased his Basal Rate last visit. He continues to go to gym for 2 hours regularly.  I plan to increase Basal rate, see below.   Pump Settings: Date: Current Date: 10/26/2014 changes are in [facility] Print   Basal Rate: Carb Ratio Sensitivity  Basal Rate: Carb Ratio Sensitivity   MN: 1.35 MN: 10 MN: 20 MN: 1.35   MN: 10   MN: 20   3 AM 1.20 11 AM: 5 11 AM: 30 3AM 1.20   11 A: 5  11 AM: 30  6 AM 0.750   6 AM 0.850  (+)      5 PM 1.00   5 PM 1.00                                  He also is interested in weighing on the [place] Scale so he can see the % Body Fat in relation to his total weight.  TANITA  BODY COMP RESULTS 10/26/14  Weight 196 lb   BMI (kg/m^2) 30.7   Fat Mass (lbs) 63.5 lb   Fat Free Mass (lbs) 132.5 lb   Total Body Water (lbs) 97 lb   TANITA  BODY COMP RESULTS 01/18/2015  Weight (lbs) 187.0   BMI (kg/m^2) 30.2   Fat Mass (lbs) 58.5   Fat Free Mass (lbs) 128.5   Total Body Water (lbs) 94.0   Weight loss of 9 pounds in past 3 months noted, including 5 pounds of fat mass. He is pleased and plans to continue with current activity level.  Plan: patient to continue using pump as directed and follow up in 3 months or as needed

## 2015-01-18 NOTE — Progress Notes (Signed)
Final EKG done 01/17/2015 in EPIC.

## 2015-01-19 NOTE — Progress Notes (Signed)
Rerouted note to Dr Delrae Rend regarding request for Insulin Pump Instructions preop and periop.

## 2015-01-23 NOTE — Progress Notes (Signed)
Spoke with patient by phone and asked him had he received any orders regarding Insulin Pump from endocrinologist- Dr Buddy Duty.  Patient stated he had not received any orders.  I told him I had sent note to Dr Buddy Duty by epic x 2 and had not received any information back .  Patient stated  He saw Nutritionist on 01/18/2015 ( encounter in EPIC) and she is aware patient is having surgery.  Patient stated his plan is to have Insulin Pump on and he has always handled Insulin Pump before with surgeries and done fine. Made anesthesia aware ( Dr Montez Hageman) of above and no new orders given.

## 2015-01-24 ENCOUNTER — Encounter (HOSPITAL_COMMUNITY): Payer: Self-pay | Admitting: Anesthesiology

## 2015-01-24 NOTE — Progress Notes (Signed)
Adah Perl , Diabetes Coordinator, made aware of patient's date, time of surgery and patient manages Insulin Pump as well as Anesthesia  Aware of patient and insulin pump status.

## 2015-01-25 ENCOUNTER — Inpatient Hospital Stay (HOSPITAL_COMMUNITY)
Admission: RE | Admit: 2015-01-25 | Discharge: 2015-01-27 | DRG: 467 | Disposition: A | Discharge status: Home Health | Payer: 59 | Source: Ambulatory Visit | Attending: Orthopedic Surgery | Admitting: Orthopedic Surgery

## 2015-01-25 ENCOUNTER — Encounter (HOSPITAL_COMMUNITY)
Admission: RE | Disposition: A | Discharge status: Home Health | Payer: Self-pay | Source: Ambulatory Visit | Attending: Orthopedic Surgery

## 2015-01-25 ENCOUNTER — Inpatient Hospital Stay (HOSPITAL_COMMUNITY): Payer: 59 | Admitting: Anesthesiology

## 2015-01-25 ENCOUNTER — Encounter (HOSPITAL_COMMUNITY): Payer: Self-pay | Admitting: Anesthesiology

## 2015-01-25 DIAGNOSIS — Z79899 Other long term (current) drug therapy: Secondary | ICD-10-CM | POA: Diagnosis not present

## 2015-01-25 DIAGNOSIS — M654 Radial styloid tenosynovitis [de Quervain]: Secondary | ICD-10-CM | POA: Diagnosis present

## 2015-01-25 DIAGNOSIS — I10 Essential (primary) hypertension: Secondary | ICD-10-CM | POA: Diagnosis present

## 2015-01-25 DIAGNOSIS — Z87891 Personal history of nicotine dependence: Secondary | ICD-10-CM

## 2015-01-25 DIAGNOSIS — F329 Major depressive disorder, single episode, unspecified: Secondary | ICD-10-CM | POA: Diagnosis present

## 2015-01-25 DIAGNOSIS — Z923 Personal history of irradiation: Secondary | ICD-10-CM

## 2015-01-25 DIAGNOSIS — Y838 Other surgical procedures as the cause of abnormal reaction of the patient, or of later complication, without mention of misadventure at the time of the procedure: Secondary | ICD-10-CM | POA: Diagnosis present

## 2015-01-25 DIAGNOSIS — T84018A Broken internal joint prosthesis, other site, initial encounter: Secondary | ICD-10-CM

## 2015-01-25 DIAGNOSIS — Z91030 Bee allergy status: Secondary | ICD-10-CM | POA: Diagnosis not present

## 2015-01-25 DIAGNOSIS — C61 Malignant neoplasm of prostate: Secondary | ICD-10-CM | POA: Diagnosis present

## 2015-01-25 DIAGNOSIS — G250 Essential tremor: Secondary | ICD-10-CM | POA: Diagnosis present

## 2015-01-25 DIAGNOSIS — M1712 Unilateral primary osteoarthritis, left knee: Secondary | ICD-10-CM | POA: Diagnosis present

## 2015-01-25 DIAGNOSIS — Z91013 Allergy to seafood: Secondary | ICD-10-CM

## 2015-01-25 DIAGNOSIS — Z794 Long term (current) use of insulin: Secondary | ICD-10-CM

## 2015-01-25 DIAGNOSIS — Z96659 Presence of unspecified artificial knee joint: Secondary | ICD-10-CM

## 2015-01-25 DIAGNOSIS — M1710 Unilateral primary osteoarthritis, unspecified knee: Secondary | ICD-10-CM | POA: Diagnosis present

## 2015-01-25 DIAGNOSIS — C779 Secondary and unspecified malignant neoplasm of lymph node, unspecified: Secondary | ICD-10-CM | POA: Diagnosis present

## 2015-01-25 DIAGNOSIS — T84033A Mechanical loosening of internal left knee prosthetic joint, initial encounter: Principal | ICD-10-CM | POA: Diagnosis present

## 2015-01-25 DIAGNOSIS — M179 Osteoarthritis of knee, unspecified: Secondary | ICD-10-CM | POA: Diagnosis present

## 2015-01-25 DIAGNOSIS — E119 Type 2 diabetes mellitus without complications: Secondary | ICD-10-CM | POA: Diagnosis present

## 2015-01-25 HISTORY — PX: TOTAL KNEE REVISION: SHX996

## 2015-01-25 LAB — GLUCOSE, CAPILLARY
GLUCOSE-CAPILLARY: 93 mg/dL (ref 70–99)
GLUCOSE-CAPILLARY: 52 mg/dL — AB (ref 70–99)
GLUCOSE-CAPILLARY: 95 mg/dL (ref 70–99)
GLUCOSE-CAPILLARY: 146 mg/dL — AB (ref 70–99)
Glucose-Capillary: 104 mg/dL — ABNORMAL HIGH (ref 70–99)
Glucose-Capillary: 91 mg/dL (ref 70–99)
Glucose-Capillary: 275 mg/dL — ABNORMAL HIGH (ref 70–99)
Glucose-Capillary: 229 mg/dL — ABNORMAL HIGH (ref 70–99)
Glucose-Capillary: 244 mg/dL — ABNORMAL HIGH (ref 70–99)

## 2015-01-25 LAB — TYPE AND SCREEN
ABO/RH(D): B POS
Antibody Screen: NEGATIVE

## 2015-01-25 SURGERY — TOTAL KNEE REVISION
Anesthesia: Choice | Site: Knee | Laterality: Left

## 2015-01-25 MED ORDER — METOCLOPRAMIDE HCL 10 MG PO TABS: 5.0000 mg | ORAL_TABLET | Freq: Three times a day (TID) | ORAL | Status: DC | PRN

## 2015-01-25 MED ORDER — BUPIVACAINE HCL (PF) 0.25 % IJ SOLN
INTRAMUSCULAR | Status: AC
  Filled 2015-01-25: qty 30

## 2015-01-25 MED ORDER — RIVAROXABAN 10 MG PO TABS: 10.0000 mg | ORAL_TABLET | Freq: Every day | ORAL | Status: DC

## 2015-01-25 MED ORDER — HYDROMORPHONE HCL 1 MG/ML IJ SOLN
INTRAMUSCULAR | Status: DC | PRN
  Administered 2015-01-25: 0.5 mg via INTRAVENOUS
  Administered 2015-01-25: 1 mg via INTRAVENOUS
  Administered 2015-01-25: 0.5 mg via INTRAVENOUS

## 2015-01-25 MED ORDER — NEOSTIGMINE METHYLSULFATE 10 MG/10ML IV SOLN
INTRAVENOUS | Status: AC
  Filled 2015-01-25: qty 1

## 2015-01-25 MED ORDER — MORPHINE SULFATE 2 MG/ML IJ SOLN
1.0000 mg | INTRAMUSCULAR | Status: DC | PRN
  Administered 2015-01-25: 2 mg via INTRAVENOUS
  Filled 2015-01-25: qty 1

## 2015-01-25 MED ORDER — MEGESTROL ACETATE 20 MG PO TABS
20.0000 mg | ORAL_TABLET | Freq: Two times a day (BID) | ORAL | Status: DC
  Administered 2015-01-25 – 2015-01-27 (×5): 20 mg via ORAL
  Filled 2015-01-25 (×6): qty 1

## 2015-01-25 MED ORDER — BISACODYL 10 MG RE SUPP: 10.0000 mg | Freq: Every day | RECTAL | Status: DC | PRN

## 2015-01-25 MED ORDER — OXYCODONE HCL 5 MG PO TABS
5.0000 mg | ORAL_TABLET | ORAL | Status: DC | PRN
  Administered 2015-01-25 – 2015-01-26 (×6): 10 mg via ORAL
  Filled 2015-01-25 (×6): qty 2

## 2015-01-25 MED ORDER — MIDAZOLAM HCL 2 MG/2ML IJ SOLN
INTRAMUSCULAR | Status: AC
  Filled 2015-01-25: qty 2

## 2015-01-25 MED ORDER — MENTHOL 3 MG MT LOZG: 1.0000 | LOZENGE | OROMUCOSAL | Status: DC | PRN

## 2015-01-25 MED ORDER — PROPOFOL 10 MG/ML IV BOLUS
INTRAVENOUS | Status: AC
  Filled 2015-01-25: qty 20

## 2015-01-25 MED ORDER — LACTATED RINGERS IV SOLN
INTRAVENOUS | Status: DC | PRN
  Administered 2015-01-25 (×2): via INTRAVENOUS

## 2015-01-25 MED ORDER — HYDROMORPHONE HCL 1 MG/ML IJ SOLN
INTRAMUSCULAR | Status: AC
  Administered 2015-01-25: 1 mg via INTRAVENOUS
  Filled 2015-01-25: qty 2

## 2015-01-25 MED ORDER — METOCLOPRAMIDE HCL 5 MG/ML IJ SOLN: 5.0000 mg | Freq: Three times a day (TID) | INTRAMUSCULAR | Status: DC | PRN

## 2015-01-25 MED ORDER — LIDOCAINE HCL (CARDIAC) 20 MG/ML IV SOLN
INTRAVENOUS | Status: AC
  Filled 2015-01-25: qty 5

## 2015-01-25 MED ORDER — GLYCOPYRROLATE 0.2 MG/ML IJ SOLN
INTRAMUSCULAR | Status: AC
  Filled 2015-01-25: qty 2

## 2015-01-25 MED ORDER — FENTANYL CITRATE 0.05 MG/ML IJ SOLN
INTRAMUSCULAR | Status: DC | PRN
  Administered 2015-01-25 (×5): 50 ug via INTRAVENOUS

## 2015-01-25 MED ORDER — EPHEDRINE SULFATE 50 MG/ML IJ SOLN
INTRAMUSCULAR | Status: AC
  Filled 2015-01-25: qty 1

## 2015-01-25 MED ORDER — INSULIN PUMP
Freq: Three times a day (TID) | SUBCUTANEOUS | Status: DC
  Administered 2015-01-25: 16 via SUBCUTANEOUS
  Administered 2015-01-25: 17.5 via SUBCUTANEOUS
  Administered 2015-01-26: 22:00:00 via SUBCUTANEOUS
  Administered 2015-01-26: 4.6 via SUBCUTANEOUS
  Administered 2015-01-26: 14.6 via SUBCUTANEOUS
  Administered 2015-01-26: 17.5 via SUBCUTANEOUS
  Administered 2015-01-27: 02:00:00 via SUBCUTANEOUS
  Administered 2015-01-27: 6.5 via SUBCUTANEOUS
  Filled 2015-01-25: qty 1

## 2015-01-25 MED ORDER — POLYETHYLENE GLYCOL 3350 17 G PO PACK: 17.0000 g | PACK | Freq: Every day | ORAL | Status: DC | PRN

## 2015-01-25 MED ORDER — CISATRACURIUM BESYLATE 20 MG/10ML IV SOLN
INTRAVENOUS | Status: AC
  Filled 2015-01-25: qty 10

## 2015-01-25 MED ORDER — METHOCARBAMOL 500 MG PO TABS
500.0000 mg | ORAL_TABLET | Freq: Four times a day (QID) | ORAL | Status: DC | PRN
Start: 2015-01-25 — End: 2015-01-27
  Administered 2015-01-25 – 2015-01-27 (×5): 500 mg via ORAL
  Filled 2015-01-25 (×5): qty 1

## 2015-01-25 MED ORDER — ACETAMINOPHEN 650 MG RE SUPP
650.0000 mg | Freq: Four times a day (QID) | RECTAL | Status: DC | PRN
Start: 2015-01-26 — End: 2015-01-27

## 2015-01-25 MED ORDER — HYDROMORPHONE HCL 1 MG/ML IJ SOLN
0.5000 mg | INTRAMUSCULAR | Status: DC | PRN
  Administered 2015-01-25 – 2015-01-26 (×10): 1 mg via INTRAVENOUS
  Filled 2015-01-25 (×10): qty 1

## 2015-01-25 MED ORDER — DEXTROSE 5 % IV SOLN
500.0000 mg | Freq: Four times a day (QID) | INTRAVENOUS | Status: DC | PRN
  Administered 2015-01-25: 500 mg via INTRAVENOUS
  Filled 2015-01-25 (×2): qty 5

## 2015-01-25 MED ORDER — DOCUSATE SODIUM 100 MG PO CAPS
100.0000 mg | ORAL_CAPSULE | Freq: Two times a day (BID) | ORAL | Status: DC
  Administered 2015-01-25 – 2015-01-27 (×4): 100 mg via ORAL

## 2015-01-25 MED ORDER — DEXAMETHASONE SODIUM PHOSPHATE 10 MG/ML IJ SOLN: 10.0000 mg | Freq: Once | INTRAMUSCULAR | Status: DC

## 2015-01-25 MED ORDER — SODIUM CHLORIDE 0.9 % IJ SOLN
INTRAMUSCULAR | Status: AC
  Filled 2015-01-25: qty 50

## 2015-01-25 MED ORDER — SODIUM CHLORIDE 0.9 % IV SOLN: INTRAVENOUS | Status: DC

## 2015-01-25 MED ORDER — HYDROMORPHONE HCL 1 MG/ML IJ SOLN
0.2500 mg | INTRAMUSCULAR | Status: DC | PRN
  Administered 2015-01-25 (×2): 0.5 mg via INTRAVENOUS

## 2015-01-25 MED ORDER — EPHEDRINE SULFATE 50 MG/ML IJ SOLN
INTRAMUSCULAR | Status: DC | PRN
  Administered 2015-01-25: 10 mg via INTRAVENOUS
  Administered 2015-01-25 (×3): 5 mg via INTRAVENOUS
  Administered 2015-01-25 (×2): 10 mg via INTRAVENOUS
  Administered 2015-01-25: 5 mg via INTRAVENOUS

## 2015-01-25 MED ORDER — CEFAZOLIN SODIUM-DEXTROSE 2-3 GM-% IV SOLR
2.0000 g | Freq: Four times a day (QID) | INTRAVENOUS | Status: AC
  Administered 2015-01-25 (×2): 2 g via INTRAVENOUS
  Filled 2015-01-25 (×2): qty 50

## 2015-01-25 MED ORDER — TAMSULOSIN HCL 0.4 MG PO CAPS
0.4000 mg | ORAL_CAPSULE | Freq: Every day | ORAL | Status: DC
  Administered 2015-01-26 – 2015-01-27 (×2): 0.4 mg via ORAL
  Filled 2015-01-25 (×2): qty 1

## 2015-01-25 MED ORDER — HYDROMORPHONE HCL 1 MG/ML IJ SOLN
0.2500 mg | INTRAMUSCULAR | Status: DC | PRN
  Administered 2015-01-25 (×4): 0.5 mg via INTRAVENOUS

## 2015-01-25 MED ORDER — SUCCINYLCHOLINE CHLORIDE 20 MG/ML IJ SOLN
INTRAMUSCULAR | Status: DC | PRN
  Administered 2015-01-25: 100 mg via INTRAVENOUS

## 2015-01-25 MED ORDER — CEFAZOLIN SODIUM-DEXTROSE 2-3 GM-% IV SOLR
2.0000 g | INTRAVENOUS | Status: AC
  Administered 2015-01-25: 2 g via INTRAVENOUS

## 2015-01-25 MED ORDER — ESCITALOPRAM OXALATE 20 MG PO TABS
20.0000 mg | ORAL_TABLET | Freq: Every day | ORAL | Status: DC
Start: 2015-01-26 — End: 2015-01-27
  Administered 2015-01-26 – 2015-01-27 (×2): 20 mg via ORAL
  Filled 2015-01-25 (×2): qty 1

## 2015-01-25 MED ORDER — PROPOFOL 10 MG/ML IV BOLUS
INTRAVENOUS | Status: DC | PRN
  Administered 2015-01-25: 50 mg via INTRAVENOUS
  Administered 2015-01-25: 200 mg via INTRAVENOUS

## 2015-01-25 MED ORDER — ONDANSETRON HCL 4 MG/2ML IJ SOLN
INTRAMUSCULAR | Status: DC | PRN
  Administered 2015-01-25: 4 mg via INTRAVENOUS

## 2015-01-25 MED ORDER — BUPIVACAINE LIPOSOME 1.3 % IJ SUSP
INTRAMUSCULAR | Status: DC | PRN
  Administered 2015-01-25: 20 mL

## 2015-01-25 MED ORDER — CISATRACURIUM BESYLATE (PF) 10 MG/5ML IV SOLN
INTRAVENOUS | Status: DC | PRN
  Administered 2015-01-25: 4 mg via INTRAVENOUS

## 2015-01-25 MED ORDER — INSULIN ASPART 100 UNIT/ML ~~LOC~~ SOLN: 0.0000 [IU] | Freq: Once | SUBCUTANEOUS | Status: AC

## 2015-01-25 MED ORDER — INSULIN ASPART 100 UNIT/ML ~~LOC~~ SOLN: 0.0000 [IU] | Freq: Three times a day (TID) | SUBCUTANEOUS | Status: DC

## 2015-01-25 MED ORDER — DIPHENHYDRAMINE HCL 12.5 MG/5ML PO ELIX
12.5000 mg | ORAL_SOLUTION | ORAL | Status: DC | PRN
Start: 2015-01-25 — End: 2015-01-27

## 2015-01-25 MED ORDER — SODIUM CHLORIDE 0.9 % IJ SOLN
INTRAMUSCULAR | Status: DC | PRN
  Administered 2015-01-25: 30 mL

## 2015-01-25 MED ORDER — CEFAZOLIN SODIUM-DEXTROSE 2-3 GM-% IV SOLR
INTRAVENOUS | Status: AC
  Filled 2015-01-25: qty 50

## 2015-01-25 MED ORDER — METOCLOPRAMIDE HCL 5 MG/ML IJ SOLN
INTRAMUSCULAR | Status: DC | PRN
Start: 2015-01-25 — End: 2015-01-25
  Administered 2015-01-25: 10 mg via INTRAVENOUS

## 2015-01-25 MED ORDER — ONDANSETRON HCL 4 MG PO TABS: 4.0000 mg | ORAL_TABLET | Freq: Four times a day (QID) | ORAL | Status: DC | PRN

## 2015-01-25 MED ORDER — LACTATED RINGERS IV SOLN
INTRAVENOUS | Status: DC
  Administered 2015-01-25: 12:00:00 via INTRAVENOUS

## 2015-01-25 MED ORDER — FENTANYL CITRATE 0.05 MG/ML IJ SOLN
INTRAMUSCULAR | Status: AC
  Filled 2015-01-25: qty 5

## 2015-01-25 MED ORDER — OXYCODONE HCL 5 MG PO TABS
5.0000 mg | ORAL_TABLET | ORAL | Status: DC | PRN
  Administered 2015-01-25 (×2): 5 mg via ORAL
  Administered 2015-01-25: 10 mg via ORAL
  Filled 2015-01-25 (×2): qty 1
  Filled 2015-01-25: qty 2

## 2015-01-25 MED ORDER — ROSUVASTATIN CALCIUM 10 MG PO TABS
10.0000 mg | ORAL_TABLET | Freq: Every morning | ORAL | Status: DC
  Administered 2015-01-25 – 2015-01-27 (×3): 10 mg via ORAL
  Filled 2015-01-25 (×3): qty 1

## 2015-01-25 MED ORDER — TEMAZEPAM 15 MG PO CAPS
30.0000 mg | ORAL_CAPSULE | Freq: Every day | ORAL | Status: DC
  Administered 2015-01-25 – 2015-01-26 (×2): 30 mg via ORAL
  Filled 2015-01-25 (×2): qty 2

## 2015-01-25 MED ORDER — PRIMIDONE 50 MG PO TABS
100.0000 mg | ORAL_TABLET | Freq: Three times a day (TID) | ORAL | Status: DC
  Administered 2015-01-25 – 2015-01-27 (×6): 100 mg via ORAL
  Filled 2015-01-25 (×8): qty 2

## 2015-01-25 MED ORDER — LIDOCAINE HCL (CARDIAC) 20 MG/ML IV SOLN
INTRAVENOUS | Status: DC | PRN
  Administered 2015-01-25 (×2): 50 mg via INTRAVENOUS

## 2015-01-25 MED ORDER — ACETAMINOPHEN 10 MG/ML IV SOLN
1000.0000 mg | Freq: Once | INTRAVENOUS | Status: AC
  Administered 2015-01-25: 1000 mg via INTRAVENOUS
  Filled 2015-01-25: qty 100

## 2015-01-25 MED ORDER — FLEET ENEMA 7-19 GM/118ML RE ENEM: 1.0000 | ENEMA | Freq: Once | RECTAL | Status: AC | PRN

## 2015-01-25 MED ORDER — HYDROMORPHONE HCL 1 MG/ML IJ SOLN
INTRAMUSCULAR | Status: AC
  Administered 2015-01-25: 1 mg via INTRAVENOUS
  Filled 2015-01-25: qty 1

## 2015-01-25 MED ORDER — SODIUM CHLORIDE 0.9 % IV SOLN
INTRAVENOUS | Status: DC
  Administered 2015-01-25: 100 mL/h via INTRAVENOUS

## 2015-01-25 MED ORDER — TRANEXAMIC ACID 100 MG/ML IV SOLN
1000.0000 mg | INTRAVENOUS | Status: AC
  Administered 2015-01-25: 1000 mg via INTRAVENOUS
  Filled 2015-01-25: qty 10

## 2015-01-25 MED ORDER — DEXTROSE 50 % IV SOLN
INTRAVENOUS | Status: AC
  Filled 2015-01-25: qty 50

## 2015-01-25 MED ORDER — DEXAMETHASONE SODIUM PHOSPHATE 4 MG/ML IJ SOLN
INTRAMUSCULAR | Status: DC | PRN
  Administered 2015-01-25: 10 mg via INTRAVENOUS

## 2015-01-25 MED ORDER — CHLORHEXIDINE GLUCONATE 4 % EX LIQD: 60.0000 mL | Freq: Once | CUTANEOUS | Status: DC

## 2015-01-25 MED ORDER — SODIUM CHLORIDE 0.9 % IJ SOLN
INTRAMUSCULAR | Status: AC
  Filled 2015-01-25: qty 10

## 2015-01-25 MED ORDER — ONDANSETRON HCL 4 MG/2ML IJ SOLN
INTRAMUSCULAR | Status: AC
  Filled 2015-01-25: qty 2

## 2015-01-25 MED ORDER — GLYCOPYRROLATE 0.2 MG/ML IJ SOLN
INTRAMUSCULAR | Status: DC | PRN
  Administered 2015-01-25: 0.2 mg via INTRAVENOUS

## 2015-01-25 MED ORDER — DEXAMETHASONE SODIUM PHOSPHATE 10 MG/ML IJ SOLN
INTRAMUSCULAR | Status: AC
  Filled 2015-01-25: qty 1

## 2015-01-25 MED ORDER — BUPIVACAINE HCL 0.25 % IJ SOLN
INTRAMUSCULAR | Status: DC | PRN
  Administered 2015-01-25: 20 mL

## 2015-01-25 MED ORDER — HYDROMORPHONE HCL 2 MG/ML IJ SOLN
INTRAMUSCULAR | Status: AC
  Filled 2015-01-25: qty 1

## 2015-01-25 MED ORDER — ACETAMINOPHEN 325 MG PO TABS: 650.0000 mg | ORAL_TABLET | Freq: Four times a day (QID) | ORAL | Status: DC | PRN

## 2015-01-25 MED ORDER — DEXTROSE 50 % IV SOLN
INTRAVENOUS | Status: DC | PRN
  Administered 2015-01-25: 12.5 mL via INTRAVENOUS

## 2015-01-25 MED ORDER — NEOSTIGMINE METHYLSULFATE 10 MG/10ML IV SOLN
INTRAVENOUS | Status: DC | PRN
  Administered 2015-01-25: 2 mg via INTRAVENOUS

## 2015-01-25 MED ORDER — PROPRANOLOL HCL 60 MG PO TABS
60.0000 mg | ORAL_TABLET | Freq: Two times a day (BID) | ORAL | Status: DC
  Administered 2015-01-25 – 2015-01-27 (×4): 60 mg via ORAL
  Filled 2015-01-25 (×5): qty 1

## 2015-01-25 MED ORDER — DEXAMETHASONE SODIUM PHOSPHATE 10 MG/ML IJ SOLN
10.0000 mg | Freq: Once | INTRAMUSCULAR | Status: AC
  Administered 2015-01-26: 10 mg via INTRAVENOUS
  Filled 2015-01-25: qty 1

## 2015-01-25 MED ORDER — ACETAMINOPHEN 500 MG PO TABS
1000.0000 mg | ORAL_TABLET | Freq: Four times a day (QID) | ORAL | Status: AC
  Administered 2015-01-25 – 2015-01-26 (×3): 1000 mg via ORAL
  Filled 2015-01-25 (×3): qty 2

## 2015-01-25 MED ORDER — METOCLOPRAMIDE HCL 5 MG/ML IJ SOLN
INTRAMUSCULAR | Status: AC
  Filled 2015-01-25: qty 2

## 2015-01-25 MED ORDER — FLUTICASONE PROPIONATE 50 MCG/ACT NA SUSP
1.0000 | Freq: Every day | NASAL | Status: DC | PRN
  Filled 2015-01-25: qty 16

## 2015-01-25 MED ORDER — BUPIVACAINE LIPOSOME 1.3 % IJ SUSP
20.0000 mL | Freq: Once | INTRAMUSCULAR | Status: DC
  Filled 2015-01-25: qty 20

## 2015-01-25 MED ORDER — PHENOL 1.4 % MT LIQD
1.0000 | OROMUCOSAL | Status: DC | PRN
  Filled 2015-01-25: qty 177

## 2015-01-25 MED ORDER — ONDANSETRON HCL 4 MG/2ML IJ SOLN: 4.0000 mg | Freq: Once | INTRAMUSCULAR | Status: DC | PRN

## 2015-01-25 MED ORDER — ONDANSETRON HCL 4 MG/2ML IJ SOLN
4.0000 mg | Freq: Four times a day (QID) | INTRAMUSCULAR | Status: DC | PRN
  Administered 2015-01-25: 4 mg via INTRAVENOUS
  Filled 2015-01-25: qty 2

## 2015-01-25 MED ORDER — ENOXAPARIN SODIUM 30 MG/0.3ML ~~LOC~~ SOLN
30.0000 mg | Freq: Two times a day (BID) | SUBCUTANEOUS | Status: DC
  Administered 2015-01-26 – 2015-01-27 (×3): 30 mg via SUBCUTANEOUS
  Filled 2015-01-25 (×5): qty 0.3

## 2015-01-25 SURGICAL SUPPLY — 91 items
ADAPTER BOLT FEMORAL +2/-2 (Knees) ×3 IMPLANT
ADPR FEM +2/-2 OFST BOLT (Knees) ×1 IMPLANT
ADPR FEM 5D STRL KN PFC SGM (Orthopedic Implant) ×1 IMPLANT
ANCH SUT 2 5.5 BABSR ASCP (Orthopedic Implant) IMPLANT
ANCH SUT 2 CP-2 EBND QANCHR+ (Anchor) ×1 IMPLANT
ANCHOR PEEK ZIP 5.5 NDL NO2 (Orthopedic Implant) IMPLANT
ANCHOR SUPER QUICK (Anchor) ×2 IMPLANT
AUG FEM 4 4 CMB LF KN POST (Knees) ×2 IMPLANT
AUG FEM SZ4 4 STRL LF KN LT TI (Knees) ×1 IMPLANT
AUG FEM SZ4 8 STRL LF KN LT TI (Knees) ×1 IMPLANT
AUG TIB SZ4 5 REV STP WDG STRL (Knees) ×2 IMPLANT
AUGMENT FEM SZ4 4 LT DIST PFC (Knees) ×3 IMPLANT
AUGMENT POSTERIOR PFC (Knees) ×1 IMPLANT
AUGMENT POSTERIOR PFC SZ4 4MM (Knees) IMPLANT
BAG DECANTER FOR FLEXI CONT (MISCELLANEOUS) ×3 IMPLANT
BAG SPEC THK2 15X12 ZIP CLS (MISCELLANEOUS)
BAG ZIPLOCK 12X15 (MISCELLANEOUS) IMPLANT
BANDAGE ELASTIC 6 VELCRO ST LF (GAUZE/BANDAGES/DRESSINGS) ×3 IMPLANT
BANDAGE ESMARK 6X9 LF (GAUZE/BANDAGES/DRESSINGS) ×1 IMPLANT
BLADE SAG 18X100X1.27 (BLADE) ×3 IMPLANT
BLADE SAW SGTL 11.0X1.19X90.0M (BLADE) ×3 IMPLANT
BLADE SAW SGTL 81X20 HD (BLADE) ×2 IMPLANT
BNDG CMPR 9X6 STRL LF SNTH (GAUZE/BANDAGES/DRESSINGS) ×1
BNDG ESMARK 6X9 LF (GAUZE/BANDAGES/DRESSINGS) ×3
BONE CEMENT GENTAMICIN (Cement) ×9 IMPLANT
CEMENT BONE GENTAMICIN 40 (Cement) ×3 IMPLANT
CEMENT RESTRICTOR DEPUY SZ 4 (Cement) ×3 IMPLANT
COMP FEM CEM LT SZ4 (Orthopedic Implant) ×3 IMPLANT
COMPONENT FEM CEM LT SZ4 (Orthopedic Implant) IMPLANT
CUFF TOURN SGL QUICK 34 (TOURNIQUET CUFF) ×3
CUFF TRNQT CYL 34X4X40X1 (TOURNIQUET CUFF) ×1 IMPLANT
DRAPE EXTREMITY T 121X128X90 (DRAPE) ×3 IMPLANT
DRAPE LG THREE QUARTER DISP (DRAPES) ×2 IMPLANT
DRAPE POUCH INSTRU U-SHP 10X18 (DRAPES) ×3 IMPLANT
DRAPE U-SHAPE 47X51 STRL (DRAPES) ×3 IMPLANT
DRSG ADAPTIC 3X8 NADH LF (GAUZE/BANDAGES/DRESSINGS) ×3 IMPLANT
DRSG PAD ABDOMINAL 8X10 ST (GAUZE/BANDAGES/DRESSINGS) ×3 IMPLANT
DURAPREP 26ML APPLICATOR (WOUND CARE) ×3 IMPLANT
ELECT REM PT RETURN 9FT ADLT (ELECTROSURGICAL) ×3
ELECTRODE REM PT RTRN 9FT ADLT (ELECTROSURGICAL) ×1 IMPLANT
EVACUATOR 1/8 PVC DRAIN (DRAIN) ×3 IMPLANT
FACESHIELD WRAPAROUND (MASK) ×15 IMPLANT
FEMORAL ADAPTER (Orthopedic Implant) ×2 IMPLANT
GAUZE SPONGE 4X4 12PLY STRL (GAUZE/BANDAGES/DRESSINGS) ×3 IMPLANT
GLOVE BIO SURGEON STRL SZ7.5 (GLOVE) IMPLANT
GLOVE BIO SURGEON STRL SZ8 (GLOVE) ×3 IMPLANT
GLOVE BIOGEL PI IND STRL 8 (GLOVE) ×1 IMPLANT
GLOVE BIOGEL PI INDICATOR 8 (GLOVE) ×2
GLOVE SURG SS PI 6.5 STRL IVOR (GLOVE) IMPLANT
GOWN STRL REUS W/TWL LRG LVL3 (GOWN DISPOSABLE) ×3 IMPLANT
GOWN STRL REUS W/TWL XL LVL3 (GOWN DISPOSABLE) ×12 IMPLANT
HANDPIECE INTERPULSE COAX TIP (DISPOSABLE) ×3
IMMOBILIZER KNEE 20 (SOFTGOODS) ×5 IMPLANT
IMMOBILIZER KNEE 20 THIGH 36 (SOFTGOODS) ×1 IMPLANT
INSERT TIBIAL RP TCS SZ 4.0 (Knees) ×2 IMPLANT
KIT BASIN OR (CUSTOM PROCEDURE TRAY) ×3 IMPLANT
MANIFOLD NEPTUNE II (INSTRUMENTS) ×3 IMPLANT
NDL SAFETY ECLIPSE 18X1.5 (NEEDLE) ×3 IMPLANT
NEEDLE HYPO 18GX1.5 SHARP (NEEDLE) ×9
NS IRRIG 1000ML POUR BTL (IV SOLUTION) ×3 IMPLANT
PACK TOTAL JOINT (CUSTOM PROCEDURE TRAY) ×3 IMPLANT
PAD CAST 4YDX4 CTTN HI CHSV (CAST SUPPLIES) ×1 IMPLANT
PADDING CAST COTTON 4X4 STRL (CAST SUPPLIES) ×3
PADDING CAST COTTON 6X4 STRL (CAST SUPPLIES) ×4 IMPLANT
PEN SKIN MARKING BROAD (MISCELLANEOUS) ×3 IMPLANT
POSITIONER SURGICAL ARM (MISCELLANEOUS) ×3 IMPLANT
POSTERIOR AUGMENT PFC SZ4 4MM (Knees) ×6 IMPLANT
POSTIER AUGMENT PFC (Knees) ×3 IMPLANT
SET HNDPC FAN SPRY TIP SCT (DISPOSABLE) ×1 IMPLANT
STAPLER VISISTAT 35W (STAPLE) ×3 IMPLANT
STEM TIBIA PFC 13X30MM (Stem) ×2 IMPLANT
STEM UNIVERSAL REVISION 75X18 (Stem) ×2 IMPLANT
SUCTION FRAZIER 12FR DISP (SUCTIONS) ×3 IMPLANT
SUT VIC AB 2-0 CT1 27 (SUTURE) ×9
SUT VIC AB 2-0 CT1 TAPERPNT 27 (SUTURE) ×3 IMPLANT
SUT VLOC 180 0 24IN GS25 (SUTURE) ×3 IMPLANT
SWAB COLLECTION DEVICE MRSA (MISCELLANEOUS) IMPLANT
SYR 20CC LL (SYRINGE) ×3 IMPLANT
SYR 50ML LL SCALE MARK (SYRINGE) ×6 IMPLANT
TOWEL OR 17X26 10 PK STRL BLUE (TOWEL DISPOSABLE) ×3 IMPLANT
TOWEL OR NON WOVEN STRL DISP B (DISPOSABLE) ×2 IMPLANT
TOWER CARTRIDGE SMART MIX (DISPOSABLE) ×3 IMPLANT
TRAY FOLEY CATH 14FRSI W/METER (CATHETERS) ×3 IMPLANT
TRAY REVISION SZ 4 (Knees) ×2 IMPLANT
TRAY SLEEVE CEM ML (Knees) ×2 IMPLANT
TUBE ANAEROBIC SPECIMEN COL (MISCELLANEOUS) IMPLANT
TUBE KAMVAC SUCTION (TUBING) IMPLANT
WATER STERILE IRR 1500ML POUR (IV SOLUTION) ×3 IMPLANT
WEDGE SIZE 4 5MM (Knees) ×6 IMPLANT
WRAP KNEE MAXI GEL POST OP (GAUZE/BANDAGES/DRESSINGS) ×2 IMPLANT
YANKAUER SUCT BULB TIP 10FT TU (MISCELLANEOUS) ×3 IMPLANT

## 2015-01-25 NOTE — Brief Op Note (Signed)
01/25/2015  10:26 AM  PATIENT:  Dale Morrow  74 y.o. male  PRE-OPERATIVE DIAGNOSIS:  LOOSE LEFT TOTAL KNEE ARTHOPLASTY  POST-OPERATIVE DIAGNOSIS:  LOOSE LEFT TOTAL KNEE ARTHOPLASTY  PROCEDURE:  Procedure(s): LEFT TOTAL KNEE  ARTHOPLASTY REVISION (Left)  SURGEON:  Surgeon(s) and Role:    Gaynelle Arabian, MD - Primary  PHYSICIAN ASSISTANT:   ASSISTANTS: Arlee Muslim, PA-C   ANESTHESIA:   general  EBL:  Total I/O In: 1000 [I.V.:1000] Out: 150 [Urine:150]  BLOOD ADMINISTERED:none  DRAINS: (Medium) Hemovact drain(s) in the left knee with  Suction Open   LOCAL MEDICATIONS USED:  OTHER Exparel  COUNTS:  YES  TOURNIQUET:   Total Tourniquet Time Documented: Thigh (Left) - 34 minutes Thigh (Left) - 18 minutes Total: Thigh (Left) - 52 minutes   DICTATION: .Other Dictation: Dictation Number [id number]  PLAN OF CARE: Admit to inpatient   PATIENT DISPOSITION:  PACU - hemodynamically stable.

## 2015-01-25 NOTE — Op Note (Signed)
NAMELENELL, LAMA NO.:  [id number]  MEDICAL RECORD NO.:  [id number]  LOCATION:  1572                         FACILITY:  [facility]  PHYSICIAN:  Gaynelle Arabian, M.D.    DATE OF BIRTH:  Sep 24, 1941  DATE OF PROCEDURE:  01/25/2015 DATE OF DISCHARGE:                              OPERATIVE REPORT   PREOPERATIVE DIAGNOSIS:  Loose left total knee arthroplasty.  POSTOPERATIVE DIAGNOSIS:  Loose left total knee arthroplasty.  PROCEDURE:  Left total knee arthroplasty revision.  SURGEON:  Gaynelle Arabian, M.D.  ASSISTANT:  Alexzandrew L. Perkins, PA-C.  ANESTHESIA:  General.  ESTIMATED BLOOD LOSS:  Minimal.  DRAINS:  Hemovac x1.  TOURNIQUET TIME:  Up 34 minutes at 300 mmHg, down 8 minutes, up additional 18 minutes at 300 mmHg.  COMPLICATIONS:  None.  CONDITION:  Stable to recovery.  BRIEF CLINICAL NOTE:  After successful administration of general anesthetic, a tourniquet was placed high on the left thigh and the left lower extremity was prepped and draped in usual sterile fashion. Extremities were wrapped in Esmarch, knee flexed, and tourniquet inflated to 300 mmHg.  A midline incision was made with a 10-blade through the subcutaneous tissue to the level of the extensor mechanism. Fresh blade was used to make a medial parapatellar arthrotomy.  Soft tissue on the proximal medial tibia, was subperiosteally elevated to the joint line with a knife and into the semimembranosus bursa with a Cobb elevator.  Soft tissue laterally was elevated with attention being paid to avoiding the patellar tendon on tibial tubercle.  We did not encounter any fluid upon entering the joint.  There was no evidence of any significant synovitis.  We did excise scar tissue underneath the extensor mechanism.  I was able to sublux the patella laterally and flexed the knee.  The tibial polyethylene was subsequently removed from the tibial component.  We then placed circumferential retractors  around the tibia and subluxed it forward.  An oscillating saw was used to disrupt the interface between the tibial component and bone and the complement was easily removed.  It was not grossly loose, but it was loose enough where it came out relatively easily.  Tibial cement was removed.  The extramedullary tibial alignment guide was placed and referencing proximally at the medial aspect of the tibial tubercle and distally along the second metatarsal axis and tibial crest.  The block was pinned to remove about 2 mm of the cut tibial surface.  Tibial resection was made with an oscillating saw.  Size 4 was the most appropriate tibial component.  We did preparation for the size 4 MBT revision tray with the modular drill, then modular drill plus the stem extension.  We also did a preparation for 29-mm sleeve.  We then addressed the femur.  Femoral component was disrupted from bone with utilizing osteotomes to disrupt the interface between the component and bone.  The femoral component was removed with essentially no bone loss.  Cement was then removed.  We accessed the femoral canal and reamed up to 18 mm.  This had great press-fit.  An 18-mm reamer was left in as an intramedullary cutting guide.  Distal femoral cutting block  was placed and we made resection.  We needed an augment on the lateral side.  We utilized 8-mm augment lateral and 4-mm augment medial.  We then placed a sizing guide, size 4 was most appropriate.  We placed the size 4 cutting block in a +2 position effectively raising the stem and lowering the flange of the femur down to the anterior part of the femur.  The rotation was marked with the epicondylar axis confirmed by placing a spacer block for rectangular flexion gap at 90 degrees.  The block was pinned in this rotation.  We then getting bone anteriorly and posteriorly.  We needed 4- mm augments medial and lateral.  Chamfer cuts were made and no bones encountered.  We  then placed the intercondylar cut for the TC3 cut. Trials were then placed.  On the tibial size, a size 4 MBT revision tray with a 13 x 30 stem extension, 5-mm medial and lateral augments and a 29- mm sleeve.  On the femoral side, it was a size 4 TC3 femur with 8-mm lateral distal augment, 4-mm medial distal augment, and 4-mm medial and lateral posterior augments.  An 18 x 75 stem extension in a +2 position and 5 degrees of valgus.  Trials were placed and with 15-mm insert, full extension was achieved with excellent varus-valgus and anterior- posterior balance throughout full range of motion.  We did a patelloplasty, removing all of tissue over the surface of the patella and then the patella components in excellent position as no where.  It tracked normally.  We then let the tourniquet down and kept it down for 8 minutes and stopped a minor bleeding with cautery.  During that 8 minutes, the components were assembled on the back table.  Once the components were assembled, then the leg was rewrapped in Esmarch, tourniquet reinflated to 300 mmHg.  We then removed the trials and sized for the tibial cement restrictor and size 4 was most appropriate.  The size 4 restrictor was placed at the appropriate depth in the tibial canal.  Once the components were assembled, then the cement was mixed. The cut bone surfaces were prepared with pulsatile lavage.  Once the cement was ready, then we injected in tibial canal and on the tibial surface and cemented the tibial component in place.  This was impacted and all extruded cements were removed.  On the femoral side, we cemented distally and had a press-fit stem.  That was impacted and all extruded cement removed.  Trial 15-mm insert was placed, knee held in full extension and the rest of the extruded cement removed.  Once the cement fully hardened, then the permanent TC3 rotating platform insert was placed into the tibial tray.  The knee was reduced with  outstanding stability.  Wound was copiously irrigated with saline solution.  A 20 mL of Exparel mixed with 30 mL of saline, injected into the periosteum of the femur and tibia and into the extensor mechanism of the subcu tissues.  A 20 mL of 0.25% Marcaine was also injected into the same tissues.  We further irrigated, then closed the arthrotomy over a Hemovac drain with running #1 V-Loc suture.  A Mitek anchor was placed at the tibial tubercle as the tendon had lifted up a tiny bit and I reattached it down to the tubercle with anchor and sutures.  Once we repaired the extensor mechanism, the flexion against gravity was 125-130 degrees.  Tourniquet was released for a second tourniquet time of 18  minutes.  A second limb of the Hemovac was placed in the subcu tissues and the subcu was closed with interrupted 2-0 Vicryl.  Skin was closed with staples.  Incision was cleaned and dried, and a bulky sterile dressing applied.  The drain was hooked to suction.  He was placed into a knee immobilizer, awakened and transported to recovery in stable condition.  Note that the surgical assistant was a medical necessity for this procedure to do it in a safe and expeditious manner.  Surgical assistant was necessary for retraction of vital ligaments and neurovascular structures for safe removal of the old prosthesis and then for proper alignment and placement of the new prosthesis.     Gaynelle Arabian, M.D.     FA/MEDQ  D:  01/25/2015  T:  01/25/2015  Job:  [id number]

## 2015-01-25 NOTE — Care Management Note (Signed)
    Page 1 of 1   01/25/2015     3:51:31 PM CARE MANAGEMENT NOTE 01/25/2015  Patient:  Dale Morrow, Dale Morrow   Account Number:  [id number]  Date Initiated:  01/25/2015  Documentation initiated by:  [facility]  Subjective/Objective Assessment:   adm: LOOSE LEFT TOTAL KNEE ARTHOPLASTY     Action/Plan:   discharge planning   Anticipated DC Date:  01/27/2015   Anticipated DC Plan:  [place]  CM consult      [facility] Choice  HOME HEALTH   Choice offered to / List presented to:  C-1 Patient        [place] arranged  [employer]      [place]   Status of service:   Medicare Important Message given?   (If response is "NO", the following Medicare IM given date fields will be blank) Date Medicare IM given:   Medicare IM given by:   Date Additional Medicare IM given:   Additional Medicare IM given by:    Discharge Disposition:  [place]  Per UR Regulation:    If discussed at Long Length of Stay Meetings, dates discussed:    Comments:  01/25/15 15:45 Cm met with pt in room to offer choice of home health agency.  Pt chooses [facility] to render HHPT.  Pt has DME at home.  Pt has supportive care from wife ([facility] emp).  Address and contact information verified by pt.  No other Cm needs were communicated. Mariane Masters, BSn, Cm [phone].

## 2015-01-25 NOTE — Progress Notes (Signed)
Inpatient Diabetes Program Recommendations  AACE/ADA: New Consensus Statement on Inpatient Glycemic Control (2013)  Target Ranges:  Prepandial:   less than 140 mg/dL      Peak postprandial:   less than 180 mg/dL (1-2 hours)      Critically ill patients:  140 - 180 mg/dL     Results for ARSENIY, TOOMEY (MRN [id number]) as of 01/25/2015 15:31  Ref. Range 01/25/2015 06:36 01/25/2015 08:02 01/25/2015 09:09 01/25/2015 09:50 01/25/2015 10:20 01/25/2015 10:53 01/25/2015 12:50  Glucose-Capillary Latest Ref Range: 70-99 mg/dL 93 52 (L) 95 91 104 (H) 146 (H) 275 (H)    Admit: Left Total Knee  History: DM   Home DM Meds: Insulin Pump (see below for settings)  Current DM Orders: Insulin Pump     **Note patient received 10 mg Decadron this AM at [facility] may run a little high today due to stress of surgery and IV steroids given.  **Spoke with patient this afternoon.  Patient groggy but easily arousable and able to manage insulin pump.  Wife stated she will oversee patient's lunch insulin bolus from pump as well as his dinner bolus while patient is slightly groggy.  **Insulin pump intact.  Appears to be running adequately.  **Insulin pump settings are as follows per note from [facility], [place] with the Nutrition and DM Management center from 01/18/15:  Basal Rates:  12AM- 1.35 units/hr 3AM- 1.2 units/hr 6AM- 0.85 units/hr 5PM- 1.00 units/hr  Total basal= 24 units per 24 hour period  Carbohydrate Ratio:  12AM- 1 unit for every 10 grams carbohydrate consumed 11AM- 1 unit for every 5 grams carbohydrates consumed  Correction Factor:  12AM- 1 unit for every 20 mg/dl above target CBG 11AM- 1 unti for every 30 mg/dl above target CBG   Will follow Wyn Quaker RN, MSN, CDE Diabetes Coordinator Inpatient Diabetes Program Team Pager: [phone] (8a-5p)

## 2015-01-25 NOTE — Interval H&P Note (Signed)
History and Physical Interval Note:  01/25/2015 8:40 AM  Dale Morrow  has presented today for surgery, with the diagnosis of LOOSE LEFT TOTAL KNEE ARTHOPLASTY  The various methods of treatment have been discussed with the patient and family. After consideration of risks, benefits and other options for treatment, the patient has consented to  Procedure(s): LEFT TOTAL KNEE  ARTHOPLASTY REVISION (Left) as a surgical intervention .  The patient's history has been reviewed, patient examined, no change in status, stable for surgery.  I have reviewed the patient's chart and labs.  Questions were answered to the patient's satisfaction.     Gearlean Alf

## 2015-01-25 NOTE — Anesthesia Postprocedure Evaluation (Signed)
  Anesthesia Post-op Note  Patient: Dale Morrow  Procedure(s) Performed: Procedure(s): LEFT TOTAL KNEE  ARTHOPLASTY REVISION (Left)  Patient Location: PACU  Anesthesia Type:General  Level of Consciousness: awake, alert , oriented and patient cooperative  Airway and Oxygen Therapy: Patient Spontanous Breathing  Post-op Pain: mild, moderate  Post-op Assessment: Post-op Vital signs reviewed, Patient's Cardiovascular Status Stable, Respiratory Function Stable, Patent Airway, No signs of Nausea or vomiting and Pain level controlled  Post-op Vital Signs: stable  Last Vitals:  Filed Vitals:   01/25/15 1200  BP: 123/56  Pulse: 60  Temp: 36.5 C  Resp: 12    Complications: No apparent anesthesia complications

## 2015-01-25 NOTE — Transfer of Care (Signed)
Immediate Anesthesia Transfer of Care Note  Patient: Dale Morrow  Procedure(s) Performed: Procedure(s): LEFT TOTAL KNEE  ARTHOPLASTY REVISION (Left)  Patient Location: PACU  Anesthesia Type:General  Level of Consciousness: Patient easily awoken, sedated, comfortable, cooperative, following commands, responds to stimulation.   Airway & Oxygen Therapy: Patient spontaneously breathing, ventilating well, oxygen via simple oxygen mask.  Post-op Assessment: Report given to PACU RN, vital signs reviewed and stable, moving all extremities.   Post vital signs: Reviewed and stable.  Complications: No apparent anesthesia complications

## 2015-01-25 NOTE — Anesthesia Procedure Notes (Signed)
Procedure Name: Intubation Date/Time: 01/25/2015 9:02 AM Performed by: Deliah Boston Pre-anesthesia Checklist: Patient identified, Emergency Drugs available, Suction available and Patient being monitored Patient Re-evaluated:Patient Re-evaluated prior to inductionOxygen Delivery Method: Circle System Utilized Preoxygenation: Pre-oxygenation with 100% oxygen Intubation Type: IV induction Ventilation: Mask ventilation without difficulty Laryngoscope Size: Mac and 4 Grade View: Grade I Tube type: Oral Tube size: 7.5 mm Number of attempts: 1 Airway Equipment and Method: Oral airway Placement Confirmation: ETT inserted through vocal cords under direct vision,  positive ETCO2 and breath sounds checked- equal and bilateral Secured at: 21 cm Tube secured with: Tape Dental Injury: Teeth and Oropharynx as per pre-operative assessment

## 2015-01-25 NOTE — Evaluation (Signed)
Physical Therapy Evaluation Patient Details Name: Dale Morrow MRN: [id number] DOB: 1941/04/24 Today's Date: 01/25/2015   History of Present Illness  revision of L TKA  Clinical Impression  Patient  Ambulated x 40' today. Tolerated well, increased pain following. Patient will benefit from PT to address problems listed in note below.  Follow Up Recommendations Home health PT;Supervision/Assistance - 24 hour    Equipment Recommendations  Rolling walker with 5" wheels    Recommendations for Other Services       Precautions / Restrictions Precautions Precautions: Knee Required Braces or Orthoses: Knee Immobilizer - Left Knee Immobilizer - Left: Discontinue once straight leg raise with < 10 degree lag      Mobility  Bed Mobility Overal bed mobility: Needs Assistance Bed Mobility: Supine to Sit;Sit to Supine     Supine to sit: Min assist Sit to supine: Min assist   General bed mobility comments: support L leg to edge and back onto bed.  Transfers Overall transfer level: Needs assistance Equipment used: Rolling walker (2 wheeled) Transfers: Sit to/from Stand Sit to Stand: Min assist;From elevated surface         General transfer comment: cues for hand and  LLE position  Ambulation/Gait Ambulation/Gait assistance: Min assist Ambulation Distance (Feet): 0 Feet Assistive device: Rolling walker (2 wheeled) Gait Pattern/deviations: Step-to pattern;Antalgic;Decreased stance time - left     General Gait Details: cues for posture  Stairs            Wheelchair Mobility    Modified Rankin (Stroke Patients Only)       Balance                                             Pertinent Vitals/Pain Pain Assessment: 0-10 Pain Score: 5  Pain Descriptors / Indicators: Aching;Cramping Pain Intervention(s): Limited activity within patient's tolerance;Monitored during session;Premedicated before session;Repositioned;Ice applied    Home Living  Family/patient expects to be discharged to:: Private residence Living Arrangements: Spouse/significant other Available Help at Discharge: Family Type of Home: House Home Access: Stairs to enter   Technical brewer of Steps: 3 Home Layout: Two level;Bed/bath upstairs;1/2 bath on main level Home Equipment: Walker - 2 wheels;Crutches;Cane - single point Additional Comments: plaans to stay down on day bed    Prior Function Level of Independence: Independent               Hand Dominance        Extremity/Trunk Assessment               Lower Extremity Assessment: LLE deficits/detail   LLE Deficits / Details: + slr,  Cervical / Trunk Assessment: Normal  Communication   Communication: No difficulties  Cognition Arousal/Alertness: Awake/alert Behavior During Therapy: WFL for tasks assessed/performed Overall Cognitive Status: Within Functional Limits for tasks assessed                      General Comments      Exercises        Assessment/Plan    PT Assessment Patient needs continued PT services  PT Diagnosis Difficulty walking;Acute pain   PT Problem List Decreased strength;Decreased range of motion;Decreased activity tolerance;Decreased mobility;Pain;Decreased knowledge of use of DME;Decreased safety awareness  PT Treatment Interventions DME instruction;Gait training;Stair training;Functional mobility training;Therapeutic activities;Therapeutic exercise;Patient/family education   PT Goals (Current goals can be found in the  Care Plan section) Acute Rehab PT Goals Patient Stated Goal: to get up and walk PT Goal Formulation: With patient Time For Goal Achievement: 02/01/15 Potential to Achieve Goals: Good    Frequency 7X/week   Barriers to discharge        Co-evaluation               End of Session Equipment Utilized During Treatment: Gait belt Activity Tolerance: Patient tolerated treatment well Patient left: in bed;with call  bell/phone within reach Nurse Communication: Mobility status         Time: 6286-3817 PT Time Calculation (min) (ACUTE ONLY): 34 min   Charges:   PT Evaluation $Initial PT Evaluation Tier I: 1 Procedure PT Treatments $Gait Training: 8-22 mins   PT G Codes:        Claretha Cooper 01/25/2015, 5:38 PM Tresa Endo PT [phone]

## 2015-01-25 NOTE — Anesthesia Preprocedure Evaluation (Addendum)
Anesthesia Evaluation  Patient identified by MRN, date of birth, ID band Patient awake    Reviewed: Allergy & Precautions, NPO status , Patient's Chart, lab work & pertinent test results  Airway Mallampati: II  TM Distance: >3 FB     Dental   Pulmonary former smoker,    Pulmonary exam normal       Cardiovascular hypertension, Rhythm:Regular Rate:Normal     Neuro/Psych    GI/Hepatic   Endo/Other  diabetes, Type 2, Oral Hypoglycemic Agents  Renal/GU      Musculoskeletal  (+) Arthritis -,   Abdominal   Peds  Hematology   Anesthesia Other Findings   Reproductive/Obstetrics                            Anesthesia Physical Anesthesia Plan  ASA: II  Anesthesia Plan:    Post-op Pain Management:    Induction:   Airway Management Planned:   Additional Equipment:   Intra-op Plan:   Post-operative Plan:   Informed Consent: I have reviewed the patients History and Physical, chart, labs and discussed the procedure including the risks, benefits and alternatives for the proposed anesthesia with the patient or authorized representative who has indicated his/her understanding and acceptance.     Plan Discussed with: CRNA, Anesthesiologist and Surgeon  Anesthesia Plan Comments:         Anesthesia Quick Evaluation

## 2015-01-26 LAB — CBC
HEMATOCRIT: 28.8 % — AB (ref 39.0–52.0)
HEMOGLOBIN: 9.6 g/dL — AB (ref 13.0–17.0)
MCH: 32 pg (ref 26.0–34.0)
MCHC: 33.3 g/dL (ref 30.0–36.0)
MCV: 96 fL (ref 78.0–100.0)
Platelets: 158 10*3/uL (ref 150–400)
RBC: 3 MIL/uL — ABNORMAL LOW (ref 4.22–5.81)
RDW: 13.7 % (ref 11.5–15.5)
WBC: 7.5 10*3/uL (ref 4.0–10.5)

## 2015-01-26 LAB — BASIC METABOLIC PANEL
ANION GAP: 4 — AB (ref 5–15)
BUN: 12 mg/dL (ref 6–23)
CO2: 27 mmol/L (ref 19–32)
Calcium: 7.6 mg/dL — ABNORMAL LOW (ref 8.4–10.5)
Chloride: 103 mmol/L (ref 96–112)
Creatinine, Ser: 0.6 mg/dL (ref 0.50–1.35)
GFR calc Af Amer: >90 mL/min (ref >90)
GFR calc non Af Amer: >90 mL/min (ref >90)
Glucose, Bld: 71 mg/dL (ref 70–99)
Potassium: 4.4 mmol/L (ref 3.5–5.1)
SODIUM: 134 mmol/L — AB (ref 135–145)

## 2015-01-26 LAB — GLUCOSE, CAPILLARY
GLUCOSE-CAPILLARY: 63 mg/dL — AB (ref 70–99)
Glucose-Capillary: 113 mg/dL — ABNORMAL HIGH (ref 70–99)
Glucose-Capillary: 137 mg/dL — ABNORMAL HIGH (ref 70–99)
Glucose-Capillary: 289 mg/dL — ABNORMAL HIGH (ref 70–99)
Glucose-Capillary: 266 mg/dL — ABNORMAL HIGH (ref 70–99)

## 2015-01-26 MED ORDER — HYDROMORPHONE HCL 2 MG PO TABS
4.0000 mg | ORAL_TABLET | ORAL | Status: DC | PRN
  Administered 2015-01-26 (×2): 8 mg via ORAL
  Administered 2015-01-26: 6 mg via ORAL
  Administered 2015-01-26: 8 mg via ORAL
  Administered 2015-01-26: 4 mg via ORAL
  Administered 2015-01-27 (×3): 8 mg via ORAL
  Filled 2015-01-26: qty 4
  Filled 2015-01-26: qty 2
  Filled 2015-01-26 (×2): qty 4
  Filled 2015-01-26: qty 3
  Filled 2015-01-26 (×3): qty 4

## 2015-01-26 NOTE — Progress Notes (Addendum)
   Subjective: 1 Day Post-Op Procedure(s) (LRB): LEFT TOTAL KNEE  ARTHOPLASTY REVISION (Left) Patient reports pain as moderate.   Plan is to go Home after hospital stay.  Objective: Vital signs in last 24 hours: Temp:  [97.6 F (36.4 C)-98.2 F (36.8 C)] 98 F (36.7 C) (04/14 0600) Pulse Rate:  [55-73] 58 (04/14 0600) Resp:  [9-18] 14 (04/14 0600) BP: (109-143)/(45-85) 113/45 mmHg (04/14 0600) SpO2:  [99 %-100 %] 100 % (04/14 0600) Weight:  [85.276 kg (188 lb)] 85.276 kg (188 lb) (04/13 1200)  Intake/Output from previous day:  Intake/Output Summary (Last 24 hours) at 01/26/15 0628 Last data filed at 01/26/15 0600  Gross per 24 hour  Intake   3905 ml  Output   2415 ml  Net   1490 ml    Intake/Output this shift: Total I/O In: 1680 [P.O.:480; I.V.:1200] Out: 1275 [Urine:1050; Drains:225]  Labs:  Recent Labs  01/26/15 0513  HGB 9.6*    Recent Labs  01/26/15 0513  WBC 7.5  RBC 3.00*  HCT 28.8*  PLT 158    Recent Labs  01/26/15 0513  NA 134*  K 4.4  CL 103  CO2 27  BUN 12  CREATININE 0.60  GLUCOSE 71  CALCIUM 7.6*   No results for input(s): LABPT, INR in the last 72 hours.  EXAM General - Patient is Alert, Appropriate and Oriented Extremity - Neurologically intact Neurovascular intact No cellulitis present Compartment soft Dressing/Incision - clean, dry, no drainage Motor Function - intact, moving foot and toes well on exam.  Remove hemovac tomorrow  Past Medical History  Diagnosis Date  . Arthritis     left knee  . Depression   . De Quervain's tenosynovitis, left 04/2012  . Tremors of nervous system     takes propranolol for tremors  . History of radiation therapy 3 years ago  . Prostate cancer, primary, with metastasis from prostate to other site     metastasis to lymph nodes  . Complication of anesthesia     pt denies ponv, pt woke up during colonscopy in past  . Diabetes mellitus     Insulin pump, takes lisinopril for kidneys, dr  Buddy Duty endocrinologist    Assessment/Plan: 1 Day Post-Op Procedure(s) (LRB): LEFT TOTAL KNEE  ARTHOPLASTY REVISION (Left) Principal Problem:   Failed total knee arthroplasty Active Problems:   OA (osteoarthritis) of knee   Advance diet Up with therapy D/C IV fluids Plan for discharge tomorrow as long as pain is well managed Oxycodone discontinued and dilaudid po initiated Hemovac to stay until tomorrow  DVT Prophylaxis - Lovenox Weight-Bearing as tolerated to left leg  Dale Morrow V 01/26/2015, 6:28 AM

## 2015-01-26 NOTE — Evaluation (Signed)
Occupational Therapy Evaluation Patient Details Name: Dale Morrow MRN: [id number] DOB: 1941-09-10 Today's Date: 01/26/2015    History of Present Illness revision of L TKA   Clinical Impression   This 74 year old man was admitted for the above surgery. He will benefit from skilled OT in acute to increase safety and independence with adls.  Pt only placed TDWB on LLE during evaluation:  He is WBAT.  Will follow in acute with supervision level goals.  Pt was independent prior to admission.    Follow Up Recommendations  No OT follow up;Supervision/Assistance - 24 hour    Equipment Recommendations  None recommended by OT    Recommendations for Other Services       Precautions / Restrictions Precautions Precautions: Knee;Fall Restrictions Weight Bearing Restrictions: No      Mobility Bed Mobility         Supine to sit: Min assist Sit to supine: Min assist   General bed mobility comments: support L leg to edge and back onto bed.  Transfers   Equipment used: Rolling walker (2 wheeled) Transfers: Sit to/from Stand Sit to Stand: Min assist;From elevated surface              Balance                                            ADL Overall ADL's : Needs assistance/impaired     Grooming: Set up;Sitting   Upper Body Bathing: Set up;Sitting   Lower Body Bathing: Minimal assistance;Sit to/from stand   Upper Body Dressing : Set up;Sitting   Lower Body Dressing: Maximal assistance;Sit to/from stand                 General ADL Comments: performed ADL from EOB.  Pt tends to put very little weight on LLE during standing and side stepping up [place]      Pertinent Vitals/Pain Pain Score: 4  Pain Descriptors / Indicators: Aching Pain Intervention(s): Limited activity within patient's tolerance;Monitored during session;Ice applied;Repositioned     Hand Dominance     Extremity/Trunk Assessment  Upper Extremity Assessment Upper Extremity Assessment: Overall WFL for tasks assessed           Communication Communication Communication: No difficulties   Cognition Arousal/Alertness: Awake/alert Behavior During Therapy: WFL for tasks assessed/performed Overall Cognitive Status: Within Functional Limits for tasks assessed                     General Comments       Exercises       Shoulder Instructions      Home Living Family/patient expects to be discharged to:: Private residence Living Arrangements: Spouse/significant other Available Help at Discharge: Family                   Bathroom Toilet: Standard     Home Equipment: Bedside commode   Additional Comments: pt plans to stay on main level with a 1/2      Prior Functioning/Environment Level of Independence: Independent             OT Diagnosis: Generalized weakness   OT Problem List: Decreased strength;Decreased activity tolerance;Pain;Decreased knowledge of precautions   OT Treatment/Interventions: Self-care/ADL training;DME and/or AE instruction;Patient/family education    OT Goals(Current  goals can be found in the care plan section) Acute Rehab OT Goals Patient Stated Goal: to get up and walk OT Goal Formulation: With patient Time For Goal Achievement: 02/02/15 Potential to Achieve Goals: Good ADL Goals Pt Will Transfer to Toilet: with supervision;ambulating;bedside commode Pt Will Perform Toileting - Clothing Manipulation and hygiene: with supervision;sit to/from stand Additional ADL Goal #1: pt will use AE for LB adls with set up vs. verbalize understanding of use  OT Frequency: Min 2X/week   Barriers to D/C:            Co-evaluation              End of Session CPM Left Knee CPM Left Knee: Off  Activity Tolerance: Patient tolerated treatment well Patient left: in bed;with call bell/phone within reach   Time: 0817-0858 OT Time Calculation (min): 41 min Charges:   OT General Charges $OT Visit: 1 Procedure OT Evaluation $Initial OT Evaluation Tier I: 1 Procedure OT Treatments $Self Care/Home Management : 8-22 mins G-Codes:    Lisbeth Puller 2015-02-25, 9:53 AM  Lesle Chris, OTR/L [phone] 02-25-2015

## 2015-01-26 NOTE — Progress Notes (Signed)
Physical Therapy Treatment Patient Details Name: LYSANDER CALIXTE MRN: [id number] DOB: 22-Nov-1940 Today's Date: 01/26/2015    History of Present Illness revision of L TKA    PT Comments    POD # 1 pm session.  Pt progressing very well and able to perform active SLR.  Assisted with amb a greater distance in hallway.  Assisted back to bed and performed all supine TKR TE's again.  Spouse present and observed session.   Pt plans to D/C to home tomorrow.  Follow Up Recommendations  Home health PT;Supervision/Assistance - 24 hour     Equipment Recommendations  Rolling walker with 5" wheels    Recommendations for Other Services       Precautions / Restrictions Precautions Precautions: Knee;Fall Required Braces or Orthoses: Knee Immobilizer - Left Restrictions Weight Bearing Restrictions: No    Mobility  Bed Mobility Overal bed mobility: Needs Assistance Bed Mobility: Supine to Sit     Supine to sit: Min assist     General bed mobility comments: Support L leg to edge of bed and down to floor   Transfers Overall transfer level: Needs assistance Equipment used: Rolling walker (2 wheeled) Transfers: Sit to/from Stand Sit to Stand: Min assist;From elevated surface         General transfer comment: cues for hand and  LLE position  Ambulation/Gait Ambulation/Gait assistance: Min assist Ambulation Distance (Feet): 115 Feet Assistive device: Rolling walker (2 wheeled) Gait Pattern/deviations: Step-to pattern;Decreased stance time - left;Step-through pattern     General Gait Details: minor cues for posture   Stairs            Wheelchair Mobility    Modified Rankin (Stroke Patients Only)       Balance                                    Cognition Arousal/Alertness: Awake/alert Behavior During Therapy: WFL for tasks assessed/performed Overall Cognitive Status: Within Functional Limits for tasks assessed                       Exercises Total Joint Exercises Ankle Circles/Pumps: AROM;Both;20 reps;Seated Quad Sets: AROM;Left;Seated;10 reps Towel Squeeze: AROM;Left;10 reps;Seated Heel Slides: AROM;Left;Seated;10 reps    General Comments        Pertinent Vitals/Pain Pain Assessment: 0-10 Pain Score: 8  Pain Location: Front of L Knee and Back  Pain Descriptors / Indicators: Aching Pain Intervention(s): Limited activity within patient's tolerance;Monitored during session;Ice applied;Repositioned    Home Living                      Prior Function            PT Goals (current goals can now be found in the care plan section) Progress towards PT goals: Progressing toward goals    Frequency  7X/week    PT Plan      Co-evaluation             End of Session Equipment Utilized During Treatment: Gait belt Activity Tolerance: Patient tolerated treatment well Patient left: in chair;with call bell/phone within reach     Time: 1440-1511 PT Time Calculation (min) (ACUTE ONLY): 31 min  Charges:  $Gait Training: 8-22 mins $Therapeutic Exercise: 8-22 mins                    G Codes:  Rica Koyanagi  PTA [facility]  Acute  Rehab Pager      [phone]

## 2015-01-26 NOTE — Progress Notes (Signed)
Physical Therapy Treatment Patient Details Name: Dale Morrow MRN: [id number] DOB: 1941-05-15 Today's Date: 01/26/2015    History of Present Illness revision of L TKA    PT Comments    POD #1 AM session; Pt was in bed; with hema VAC on L;  L Knee immobilizer on; sit up on EOB; Assisted pt to stand to use BR (Urinal) bedside; Ambulate 146ft; return to room in chair; TA exercises; Pt stayed in Chair with ICE; pt stated he would like to go back to bed after ICE advised pt to call RN for assistance.  Follow Up Recommendations  Home health PT;Supervision/Assistance - 24 hour     Equipment Recommendations  Rolling walker with 5" wheels    Recommendations for Other Services       Precautions / Restrictions Precautions Precautions: Knee;Fall Required Braces or Orthoses: Knee Immobilizer - Left Restrictions Weight Bearing Restrictions: No    Mobility  Bed Mobility Overal bed mobility: Needs Assistance Bed Mobility: Supine to Sit     Supine to sit: Min assist     General bed mobility comments: Support L leg to edge of bed and down to floor   Transfers Overall transfer level: Needs assistance Equipment used: Rolling walker (2 wheeled) Transfers: Sit to/from Stand Sit to Stand: Min assist;From elevated surface         General transfer comment: cues for hand and  LLE position  Ambulation/Gait Ambulation/Gait assistance: Min assist Ambulation Distance (Feet): 100 Feet Assistive device: Rolling walker (2 wheeled) Gait Pattern/deviations: Step-to pattern;Decreased stance time - left;Step-through pattern     General Gait Details: minor cues for posture   Stairs            Wheelchair Mobility    Modified Rankin (Stroke Patients Only)       Balance                                    Cognition Arousal/Alertness: Awake/alert Behavior During Therapy: WFL for tasks assessed/performed Overall Cognitive Status: Within Functional Limits for  tasks assessed                      Exercises Total Joint Exercises Ankle Circles/Pumps: AROM;Both;20 reps;Seated Quad Sets: AROM;Left;Seated;10 reps Towel Squeeze: AROM;Left;10 reps;Seated Heel Slides: AROM;Left;Seated;10 reps    General Comments        Pertinent Vitals/Pain Pain Assessment: 0-10 Pain Score: 8  Pain Location: Front of L Knee and Back  Pain Descriptors / Indicators: Aching Pain Intervention(s): Limited activity within patient's tolerance;Monitored during session;Ice applied;Repositioned    Home Living                      Prior Function            PT Goals (current goals can now be found in the care plan section) Progress towards PT goals: Progressing toward goals    Frequency  7X/week    PT Plan      Co-evaluation             End of Session Equipment Utilized During Treatment: Gait belt Activity Tolerance: Patient tolerated treatment well Patient left: in chair;with call bell/phone within reach     Time:   1005-1030    Charges:                       G Codes:  Duric,[place] Student PTA 01/26/2015, 1:09 PM   Reviewed and agree with above  Rica Koyanagi  PTA [facility]  Acute  Rehab Pager      [phone]

## 2015-01-26 NOTE — Progress Notes (Signed)
Inpatient Diabetes Program Recommendations  AACE/ADA: New Consensus Statement on Inpatient Glycemic Control (2013)  Target Ranges:  Prepandial:   less than 140 mg/dL      Peak postprandial:   less than 180 mg/dL (1-2 hours)      Critically ill patients:  140 - 180 mg/dL    Results for Dale Morrow, Dale Morrow (MRN [id number]) as of 01/26/2015 11:16  Ref. Range 01/25/2015 06:36 01/25/2015 08:02 01/25/2015 09:09 01/25/2015 09:50 01/25/2015 10:20 01/25/2015 10:53 01/25/2015 12:50 01/25/2015 16:07 01/25/2015 22:28  Glucose-Capillary Latest Ref Range: 70-99 mg/dL 93 52 (L) 95 91 104 (H) 146 (H) 275 (H) 229 (H) 244 (H)    Results for Dale Morrow, Dale Morrow (MRN [id number]) as of 01/26/2015 11:16  Ref. Range 01/26/2015 01:46 01/26/2015 07:18  Glucose-Capillary Latest Ref Range: 70-99 mg/dL 113 (H) 63 (L)    Admit: Left Total Knee  History: DM   Home DM Meds: Insulin Pump (see below for settings)  Current DM Orders: Insulin Pump     **Note patient received 10 mg Decadron this AM at [facility] may run a little high today due to stress of surgery and IV steroids given.  **Spoke with patient this morning. Patient awake, alert, and oriented and able to manage insulin pump.   **Insulin pump intact. Appears to be running adequately.    Will follow daily Wyn Quaker RN, MSN, CDE Diabetes Coordinator Inpatient Diabetes Program Team Pager: [phone] (8a-5p)

## 2015-01-27 LAB — CBC
HCT: 27.7 % — ABNORMAL LOW (ref 39.0–52.0)
Hemoglobin: 9.1 g/dL — ABNORMAL LOW (ref 13.0–17.0)
MCH: 31.8 pg (ref 26.0–34.0)
MCHC: 32.9 g/dL (ref 30.0–36.0)
MCV: 96.9 fL (ref 78.0–100.0)
Platelets: 152 10*3/uL (ref 150–400)
RBC: 2.86 MIL/uL — ABNORMAL LOW (ref 4.22–5.81)
RDW: 13.6 % (ref 11.5–15.5)
WBC: 6.7 10*3/uL (ref 4.0–10.5)

## 2015-01-27 LAB — BASIC METABOLIC PANEL
Anion gap: 6 (ref 5–15)
BUN: 16 mg/dL (ref 6–23)
CHLORIDE: 98 mmol/L (ref 96–112)
CO2: 28 mmol/L (ref 19–32)
Calcium: 8 mg/dL — ABNORMAL LOW (ref 8.4–10.5)
Creatinine, Ser: 0.77 mg/dL (ref 0.50–1.35)
GFR calc non Af Amer: 89 mL/min — ABNORMAL LOW (ref >90)
Glucose, Bld: 292 mg/dL — ABNORMAL HIGH (ref 70–99)
Potassium: 4.2 mmol/L (ref 3.5–5.1)
Sodium: 132 mmol/L — ABNORMAL LOW (ref 135–145)

## 2015-01-27 LAB — GLUCOSE, CAPILLARY
GLUCOSE-CAPILLARY: 254 mg/dL — AB (ref 70–99)
GLUCOSE-CAPILLARY: 248 mg/dL — AB (ref 70–99)

## 2015-01-27 MED ORDER — METHOCARBAMOL 500 MG PO TABS: 500.0000 mg | ORAL_TABLET | Freq: Four times a day (QID) | ORAL | Status: DC | PRN

## 2015-01-27 MED ORDER — ENOXAPARIN SODIUM 40 MG/0.4ML ~~LOC~~ SOLN: 40.0000 mg | SUBCUTANEOUS | Status: DC

## 2015-01-27 MED ORDER — HYDROMORPHONE HCL 4 MG PO TABS: 4.0000 mg | ORAL_TABLET | ORAL | Status: DC | PRN

## 2015-01-27 MED ORDER — ENOXAPARIN (LOVENOX) PATIENT EDUCATION KIT
PACK | Freq: Once | Status: AC
  Administered 2015-01-27: 1
  Filled 2015-01-27: qty 1

## 2015-01-27 NOTE — Progress Notes (Signed)
Physical Therapy Treatment Patient Details Name: Dale Morrow MRN: [id number] DOB: May 24, 1941 Today's Date: 01/27/2015    History of Present Illness revision of L TKA    PT Comments    POD #2; Pt was in chair with ice on L knee; placed L knee immobilizer for ambulating; sit to stand; ambulate with walker/crutches/up and down stairs with crutches and walker; Pt stated "he uses crutches at home" would like to try crutches; unsteady gait with crutches on level surface better with walker; Pt felt uncomfortable with walker up and down stairs so used B crutches which he preferred and performed well; return to bed in room;  TE in bed pt needed cues and reposition for short arc quad; provided handout/education for stairs using crutches; Ice placed on L knee.  Pt ready for D/C to home.   Follow Up Recommendations  Home health PT;Supervision/Assistance - 24 hour     Equipment Recommendations  Rolling walker with 5" wheels    Recommendations for Other Services       Precautions / Restrictions Precautions Precautions: Knee;Fall Required Braces or Orthoses: Knee Immobilizer - Left Knee Immobilizer - Left: Discontinue once straight leg raise with < 10 degree lag Restrictions Weight Bearing Restrictions: No    Mobility  Bed Mobility Overal bed mobility: Needs Assistance Bed Mobility: Sit to Supine       Sit to supine: Min assist   General bed mobility comments: Support L leg as getting up on bed to lay down   Transfers Overall transfer level: Needs assistance Equipment used: Rolling walker (2 wheeled) Transfers: Sit to/from Stand Sit to Stand: Min assist         General transfer comment: Assisted to stand, minimal cue for hand placement   Ambulation/Gait Ambulation/Gait assistance: Min assist Ambulation Distance (Feet): 80 Feet (Up stairs w/ walker and crutches; level surface crutches) Assistive device: Rolling walker (2 wheeled);Crutches Gait Pattern/deviations:  Step-through pattern;Step-to pattern;Decreased stance time - left     General Gait Details: minor cues; walked with crutches appeard unsteady level surface; walker more steady and safe   Stairs Stairs: Yes Stairs assistance: Min assist Stair Management: No rails;With crutches;With walker;Backwards;Forwards Number of Stairs: 2 General stair comments: pt felt uncomfortable with walker going backwards; pt wanted to try crutches; crutches up and down stairs   Wheelchair Mobility    Modified Rankin (Stroke Patients Only)       Balance                                    Cognition Arousal/Alertness: Awake/alert Behavior During Therapy: WFL for tasks assessed/performed Overall Cognitive Status: Within Functional Limits for tasks assessed                      Exercises Total Joint Exercises Ankle Circles/Pumps: AROM;Supine;20 reps;Both Quad Sets: AROM;Supine;Left;10 reps Towel Squeeze: AROM;Supine;Left;10 reps Short Arc Quad[place]clair Ship;Left;5 reps;Supine (require cues to avoid hip flexion sub; use pillow under thight) Heel Slides: AROM;Left;10 reps;Supine (60 degrees Knee flexion measured (goniometer)) Hip ABduction/ADduction: AROM;Left;10 reps;Supine Straight Leg Raises: AROM;Left;10 reps;Supine Goniometric ROM: Knee Flexion 60 degrees measured     General Comments        Pertinent Vitals/Pain Pain Assessment: No/denies pain Pain Score: 7  (at end after session pain 6/10 ) Pain Location: Front and Back of L Knee Pain Descriptors / Indicators: Aching Pain Intervention(s): Limited activity within patient's tolerance;Monitored during session;Repositioned;Ice  applied    Home Living                      Prior Function            PT Goals (current goals can now be found in the care plan section) Progress towards PT goals: Progressing toward goals    Frequency  7X/week    PT Plan      Co-evaluation             End of Session  Equipment Utilized During Treatment: Gait belt Activity Tolerance: Patient tolerated treatment well Patient left: in bed;with call bell/phone within reach;with family/visitor present     Time:  -     Charges:                       G Codes:      [place] PTA 01/27/2015, 12:56 PM   Reviewed and agree with above  Rica Koyanagi  PTA [facility]  Acute  Rehab Pager      [phone]

## 2015-01-27 NOTE — Discharge Summary (Signed)
Physician Discharge Summary   Patient ID: Dale Morrow MRN: [id number] DOB/AGE: 05-18-1941 74 y.o.  Admit date: 01/25/2015 Discharge date: 01-27-2015  Primary Diagnosis:  Loose left total knee arthroplasty.  Admission Diagnoses:  Past Medical History  Diagnosis Date  . Arthritis     left knee  . Depression   . De Quervain's tenosynovitis, left 04/2012  . Tremors of nervous system     takes propranolol for tremors  . History of radiation therapy 3 years ago  . Prostate cancer, primary, with metastasis from prostate to other site     metastasis to lymph nodes  . Complication of anesthesia     pt denies ponv, pt woke up during colonscopy in past  . Diabetes mellitus     Insulin pump, takes lisinopril for kidneys, dr Buddy Duty endocrinologist   Discharge Diagnoses:   Principal Problem:   Failed total knee arthroplasty Active Problems:   OA (osteoarthritis) of knee  Estimated body mass index is 30.36 kg/(m^2) as calculated from the following:   Height as of this encounter: 5' 6"  (1.676 m).   Weight as of this encounter: 85.276 kg (188 lb).  Procedure:  Procedure(s) (LRB): LEFT TOTAL KNEE  ARTHOPLASTY REVISION (Left)   Consults: None  HPI: Dale Morrow, 74 y.o. male, has a history of pain and functional disability in the left knee(s) due to arthritis and failed previous arthroplasty and patient has failed non-surgical conservative treatments for greater than 12 weeks to include NSAID's and/or analgesics, flexibility and strengthening excercises, supervised PT with diminished ADL's post treatment, use of assistive devices and activity modification. The indications for the revision of the total knee arthroplasty are loosening of one or more components and fracture or mechanical failure of one or components. Onset of symptoms was gradual starting 3 years ago with gradually worsening course since that time. Prior procedures on the left knee(s) include arthroscopy and menisectomy.  Patient currently rates pain in the right knee(s) at 7 out of 10 with activity. There is night pain, worsening of pain with activity and weight bearing, pain that interferes with activities of daily living, pain with passive range of motion, crepitus and joint swelling. Patient has evidence of prosthetic loosening by imaging studies. This condition presents safety issues increasing the risk of falls. There is no current active infection.  Laboratory Data: Admission on 01/25/2015  Component Date Value Ref Range Status  . Glucose-Capillary 01/25/2015 93  70 - 99 mg/dL Final  . Comment 1 01/25/2015 Notify RN   Final  . ABO/RH(D) 01/25/2015 B POS   Final  . Antibody Screen 01/25/2015 NEG   Final  . Sample Expiration 01/25/2015 01/28/2015   Final  . Glucose-Capillary 01/25/2015 52* 70 - 99 mg/dL Final  . Comment 1 01/25/2015 Document in Chart   Final  . Comment 2 01/25/2015 Call MD NNP PA CNM   Final  . Glucose-Capillary 01/25/2015 146* 70 - 99 mg/dL Final  . Comment 1 01/25/2015 Notify RN   Final  . Comment 2 01/25/2015 Document in Chart   Final  . Glucose-Capillary 01/25/2015 95  70 - 99 mg/dL Final  . Glucose-Capillary 01/25/2015 91  70 - 99 mg/dL Final  . Glucose-Capillary 01/25/2015 104* 70 - 99 mg/dL Final  . Comment 1 01/25/2015 Notify RN   Final  . Glucose-Capillary 01/25/2015 275* 70 - 99 mg/dL Final  . Glucose-Capillary 01/25/2015 229* 70 - 99 mg/dL Final  . WBC 01/26/2015 7.5  4.0 - 10.5 K/uL Final  .  RBC 01/26/2015 3.00* 4.22 - 5.81 MIL/uL Final  . Hemoglobin 01/26/2015 9.6* 13.0 - 17.0 g/dL Final  . HCT 01/26/2015 28.8* 39.0 - 52.0 % Final  . MCV 01/26/2015 96.0  78.0 - 100.0 fL Final  . MCH 01/26/2015 32.0  26.0 - 34.0 pg Final  . MCHC 01/26/2015 33.3  30.0 - 36.0 g/dL Final  . RDW 01/26/2015 13.7  11.5 - 15.5 % Final  . Platelets 01/26/2015 158  150 - 400 K/uL Final  . Sodium 01/26/2015 134* 135 - 145 mmol/L Final  . Potassium 01/26/2015 4.4  3.5 - 5.1 mmol/L Final  .  Chloride 01/26/2015 103  96 - 112 mmol/L Final  . CO2 01/26/2015 27  19 - 32 mmol/L Final  . Glucose, Bld 01/26/2015 71  70 - 99 mg/dL Final  . BUN 01/26/2015 12  6 - 23 mg/dL Final  . Creatinine, Ser 01/26/2015 0.60  0.50 - 1.35 mg/dL Final  . Calcium 01/26/2015 7.6* 8.4 - 10.5 mg/dL Final  . GFR calc non Af Amer 01/26/2015 >90  >90 mL/min Final  . GFR calc Af Amer 01/26/2015 >90  >90 mL/min Final   Comment: (NOTE) The eGFR has been calculated using the CKD EPI equation. This calculation has not been validated in all clinical situations. eGFR's persistently <90 mL/min signify possible Chronic Kidney Disease.   . Anion gap 01/26/2015 4* 5 - 15 Final  . Glucose-Capillary 01/25/2015 244* 70 - 99 mg/dL Final  . Glucose-Capillary 01/26/2015 113* 70 - 99 mg/dL Final  . Glucose-Capillary 01/26/2015 63* 70 - 99 mg/dL Final  . Glucose-Capillary 01/26/2015 137* 70 - 99 mg/dL Final  . WBC 01/27/2015 6.7  4.0 - 10.5 K/uL Final  . RBC 01/27/2015 2.86* 4.22 - 5.81 MIL/uL Final  . Hemoglobin 01/27/2015 9.1* 13.0 - 17.0 g/dL Final  . HCT 01/27/2015 27.7* 39.0 - 52.0 % Final  . MCV 01/27/2015 96.9  78.0 - 100.0 fL Final  . MCH 01/27/2015 31.8  26.0 - 34.0 pg Final  . MCHC 01/27/2015 32.9  30.0 - 36.0 g/dL Final  . RDW 01/27/2015 13.6  11.5 - 15.5 % Final  . Platelets 01/27/2015 152  150 - 400 K/uL Final  . Sodium 01/27/2015 132* 135 - 145 mmol/L Final  . Potassium 01/27/2015 4.2  3.5 - 5.1 mmol/L Final  . Chloride 01/27/2015 98  96 - 112 mmol/L Final  . CO2 01/27/2015 28  19 - 32 mmol/L Final  . Glucose, Bld 01/27/2015 292* 70 - 99 mg/dL Final  . BUN 01/27/2015 16  6 - 23 mg/dL Final  . Creatinine, Ser 01/27/2015 0.77  0.50 - 1.35 mg/dL Final  . Calcium 01/27/2015 8.0* 8.4 - 10.5 mg/dL Final  . GFR calc non Af Amer 01/27/2015 89* >90 mL/min Final  . GFR calc Af Amer 01/27/2015 >90  >90 mL/min Final   Comment: (NOTE) The eGFR has been calculated using the CKD EPI equation. This calculation has  not been validated in all clinical situations. eGFR's persistently <90 mL/min signify possible Chronic Kidney Disease.   . Anion gap 01/27/2015 6  5 - 15 Final  . Glucose-Capillary 01/26/2015 289* 70 - 99 mg/dL Final  . Glucose-Capillary 01/26/2015 266* 70 - 99 mg/dL Final  . Glucose-Capillary 01/27/2015 254* 70 - 99 mg/dL Final  . Glucose-Capillary 01/27/2015 248* 70 - 99 mg/dL Final  Hospital Outpatient Visit on 01/17/2015  Component Date Value Ref Range Status  . aPTT 01/17/2015 30  24 - 37 seconds Final  .  WBC 01/17/2015 5.2  4.0 - 10.5 K/uL Final  . RBC 01/17/2015 3.78* 4.22 - 5.81 MIL/uL Final  . Hemoglobin 01/17/2015 11.6* 13.0 - 17.0 g/dL Final  . HCT 01/17/2015 36.6* 39.0 - 52.0 % Final  . MCV 01/17/2015 96.8  78.0 - 100.0 fL Final  . MCH 01/17/2015 30.7  26.0 - 34.0 pg Final  . MCHC 01/17/2015 31.7  30.0 - 36.0 g/dL Final  . RDW 01/17/2015 13.8  11.5 - 15.5 % Final  . Platelets 01/17/2015 241  150 - 400 K/uL Final  . Sodium 01/17/2015 137  135 - 145 mmol/L Final  . Potassium 01/17/2015 5.0  3.5 - 5.1 mmol/L Final  . Chloride 01/17/2015 101  96 - 112 mmol/L Final  . CO2 01/17/2015 26  19 - 32 mmol/L Final  . Glucose, Bld 01/17/2015 193* 70 - 99 mg/dL Final  . BUN 01/17/2015 24* 6 - 23 mg/dL Final  . Creatinine, Ser 01/17/2015 0.86  0.50 - 1.35 mg/dL Final  . Calcium 01/17/2015 8.6  8.4 - 10.5 mg/dL Final  . Total Protein 01/17/2015 7.0  6.0 - 8.3 g/dL Final  . Albumin 01/17/2015 3.9  3.5 - 5.2 g/dL Final  . AST 01/17/2015 40* 0 - 37 U/L Final  . ALT 01/17/2015 30  0 - 53 U/L Final  . Alkaline Phosphatase 01/17/2015 38* 39 - 117 U/L Final  . Total Bilirubin 01/17/2015 0.3  0.3 - 1.2 mg/dL Final  . GFR calc non Af Amer 01/17/2015 85* >90 mL/min Final  . GFR calc Af Amer 01/17/2015 >90  >90 mL/min Final   Comment: (NOTE) The eGFR has been calculated using the CKD EPI equation. This calculation has not been validated in all clinical situations. eGFR's persistently <90  mL/min signify possible Chronic Kidney Disease.   . Anion gap 01/17/2015 10  5 - 15 Final  . Prothrombin Time 01/17/2015 13.7  11.6 - 15.2 seconds Final  . INR 01/17/2015 1.03  0.00 - 1.49 Final  . Color, Urine 01/17/2015 YELLOW  YELLOW Final  . APPearance 01/17/2015 CLEAR  CLEAR Final  . Specific Gravity, Urine 01/17/2015 1.031* 1.005 - 1.030 Final  . pH 01/17/2015 6.0  5.0 - 8.0 Final  . Glucose, UA 01/17/2015 500* NEGATIVE mg/dL Final  . Hgb urine dipstick 01/17/2015 NEGATIVE  NEGATIVE Final  . Bilirubin Urine 01/17/2015 NEGATIVE  NEGATIVE Final  . Ketones, ur 01/17/2015 NEGATIVE  NEGATIVE mg/dL Final  . Protein, ur 01/17/2015 NEGATIVE  NEGATIVE mg/dL Final  . Urobilinogen, UA 01/17/2015 0.2  0.0 - 1.0 mg/dL Final  . Nitrite 01/17/2015 NEGATIVE  NEGATIVE Final  . Leukocytes, UA 01/17/2015 NEGATIVE  NEGATIVE Final   MICROSCOPIC NOT DONE ON URINES WITH NEGATIVE PROTEIN, BLOOD, LEUKOCYTES, NITRITE, OR GLUCOSE <1000 mg/dL.  [name] MRSA, PCR 01/17/2015 NEGATIVE  NEGATIVE Final  . Staphylococcus aureus 01/17/2015 NEGATIVE  NEGATIVE Final   Comment:        The Xpert SA Assay (FDA approved for NASAL specimens in patients over 47 years of age), is one component of a comprehensive surveillance program.  Test performance has been validated by [facility] for patients greater than or equal to 37 year old. It is not intended to diagnose infection nor to guide or monitor treatment.      X-Rays:No results found.  EKG: Orders placed or performed during the hospital encounter of 01/17/15  . EKG 12-Lead  . EKG 12-Lead     Hospital Course: Dale Morrow is a 74 y.o. who was  admitted to [facility]. They were brought to the operating room on 01/25/2015 and underwent Procedure(s): LEFT TOTAL KNEE  ARTHOPLASTY REVISION.  Patient tolerated the procedure well and was later transferred to the recovery room and then to the orthopaedic floor for postoperative care.  They were given PO and IV  analgesics for pain control following their surgery.  They were given 24 hours of postoperative antibiotics of  Anti-infectives    Start     Dose/Rate Route Frequency Ordered Stop   01/25/15 1500  ceFAZolin (ANCEF) IVPB 2 g/50 mL premix     2 g 100 mL/hr over 30 Minutes Intravenous Every 6 hours 01/25/15 1210 01/25/15 2139   01/25/15 0650  ceFAZolin (ANCEF) IVPB 2 g/50 mL premix     2 g 100 mL/hr over 30 Minutes Intravenous On call to O.R. 01/25/15 0092 01/25/15 0903     and started on DVT prophylaxis in the form of Lovenox.   PT and OT were ordered for total joint protocol.  Discharge planning consulted to help with postop disposition and equipment needs.  Patient had a tough night on the evening of surgery with pain.  They started to get up OOB with therapy on day one. Hemovac drain was left in place on rounds.  It came out later that afternoon.  Continued to work with therapy into day two.  Dressing was changed on day two and the incision was healing well. Patient was seen in rounds and was ready to go home.  Discharge home with home health Diet - Cardiac diet Follow up - in 2 weeks Activity - WBAT Disposition - Home Condition Upon Discharge - Good D/C Meds - See DC Summary DVT Prophylaxis - Lovenox for a total of ten days and then Aspirin 325 mg BID for three weeks.  Discharge Instructions    Call MD / Call 911    Complete by:  As directed   If you experience chest pain or shortness of breath, CALL 911 and be transported to the hospital emergency room.  If you develope a fever above 101 F, pus (white drainage) or increased drainage or redness at the wound, or calf pain, call your surgeon's office.     Change dressing    Complete by:  As directed   Change dressing daily with sterile 4 x 4 inch gauze dressing and apply TED hose. Do not submerge the incision under water.     Constipation Prevention    Complete by:  As directed   Drink plenty of fluids.  Prune juice may be helpful.  You  may use a stool softener, such as Colace (over the counter) 100 mg twice a day.  Use MiraLax (over the counter) for constipation as needed.     Diet - low sodium heart healthy    Complete by:  As directed      Diet Carb Modified    Complete by:  As directed      Discharge instructions    Complete by:  As directed   Pick up stool softner and laxative for home use following surgery while on pain medications. Do not submerge incision under water. Please use good hand washing techniques while changing dressing each day. May shower starting three days after surgery. Please use a clean towel to pat the incision dry following showers. Continue to use ice for pain and swelling after surgery. Do not use any lotions or creams on the incision until instructed by your surgeon.  Continue  Lovenox injections for 8 more days starting Saturday 01/28/2015.  Once completed the Lovenox, then start a 325 mg Aspirin and take twice a day for three more weeks.  Postoperative Constipation Protocol  Constipation - defined medically as fewer than three stools per week and severe constipation as less than one stool per week.  One of the most common issues patients have following surgery is constipation.  Even if you have a regular bowel pattern at home, your normal regimen is likely to be disrupted due to multiple reasons following surgery.  Combination of anesthesia, postoperative narcotics, change in appetite and fluid intake all can affect your bowels.  In order to avoid complications following surgery, here are some recommendations in order to help you during your recovery period.  Colace (docusate) - Pick up an over-the-counter form of Colace or another stool softener and take twice a day as long as you are requiring postoperative pain medications.  Take with a full glass of water daily.  If you experience loose stools or diarrhea, hold the colace until you stool forms back up.  If your symptoms do not get better  within 1 week or if they get worse, check with your doctor.  Dulcolax (bisacodyl) - Pick up over-the-counter and take as directed by the product packaging as needed to assist with the movement of your bowels.  Take with a full glass of water.  Use this product as needed if not relieved by Colace only.   MiraLax (polyethylene glycol) - Pick up over-the-counter to have on hand.  MiraLax is a solution that will increase the amount of water in your bowels to assist with bowel movements.  Take as directed and can mix with a glass of water, juice, soda, coffee, or tea.  Take if you go more than two days without a movement. Do not use MiraLax more than once per day. Call your doctor if you are still constipated or irregular after using this medication for 7 days in a row.  If you continue to have problems with postoperative constipation, please contact the office for further assistance and recommendations.  If you experience "the worst abdominal pain ever" or develop nausea or vomiting, please contact the office immediatly for further recommendations for treatment.     Do not put a pillow under the knee. Place it under the heel.    Complete by:  As directed      Do not sit on low chairs, stoools or toilet seats, as it may be difficult to get up from low surfaces    Complete by:  As directed      Driving restrictions    Complete by:  As directed   No driving until released by the physician.     Increase activity slowly as tolerated    Complete by:  As directed      Lifting restrictions    Complete by:  As directed   No lifting until released by the physician.     Patient may shower    Complete by:  As directed   You may shower without a dressing once there is no drainage.  Do not wash over the wound.  If drainage remains, do not shower until drainage stops.     TED hose    Complete by:  As directed   Use stockings (TED hose) for 3 weeks on both leg(s).  You may remove them at night for sleeping.       Weight bearing as tolerated  Complete by:  As directed   Laterality:  left  Extremity:  Lower            Medication List    STOP taking these medications        calcium carbonate 1250 (500 CA) MG tablet  Commonly known as:  OS-CAL - dosed in mg of elemental calcium     HYDROcodone-acetaminophen 5-325 MG per tablet  Commonly known as:  NORCO/VICODIN     multivitamin tablet      TAKE these medications        denosumab 60 MG/ML Soln injection  Commonly known as:  PROLIA  Inject 60 mg into the skin every 6 (six) months. Administer in upper arm, thigh, or abdomen     enoxaparin 40 MG/0.4ML injection  Commonly known as:  LOVENOX  Inject 0.4 mLs (40 mg total) into the skin daily. Take injections for 8 more days starting Saturday 01/28/2015.  Once completed the Lovenox, then start a 325 mg Aspirin and take twice a day for three more weeks.     escitalopram 20 MG tablet  Commonly known as:  LEXAPRO  TAKE 1 TABLET BY MOUTH ONCE DAILY     fluticasone 50 MCG/ACT nasal spray  Commonly known as:  FLONASE  Place 1 spray into both nostrils daily as needed for allergies or rhinitis.     HYDROmorphone 4 MG tablet  Commonly known as:  DILAUDID  Take 1-2 tablets (4-8 mg total) by mouth every 4 (four) hours as needed for moderate pain or severe pain.     insulin pump Soln  Inject into the skin. Uses humalog insulin basal rate 1200 am= 1.2, 300 am= 1.2, 600 am=0.85, 500 pm=9.0     Leuprolide Acetate (6 Month) 45 MG injection  Commonly known as:  LUPRON  Inject 45 mg into the muscle every 6 (six) months.     lisinopril 10 MG tablet  Commonly known as:  PRINIVIL,ZESTRIL  Take 10 mg by mouth every morning.     megestrol 20 MG tablet  Commonly known as:  MEGACE  Take 20 mg by mouth 2 (two) times daily.     methocarbamol 500 MG tablet  Commonly known as:  ROBAXIN  Take 1 tablet (500 mg total) by mouth every 6 (six) hours as needed for muscle spasms.     polyethylene glycol  powder powder  Commonly known as:  GLYCOLAX/MIRALAX  Take 17 g by mouth at bedtime.     primidone 50 MG tablet  Commonly known as:  MYSOLINE  TAKE 2 TABLETS BY MOUTH THREE TIMES A DAY     propranolol 60 MG tablet  Commonly known as:  INDERAL  Take 1 tablet (60 mg total) by mouth 2 (two) times daily.     rosuvastatin 10 MG tablet  Commonly known as:  CRESTOR  Take 10 mg by mouth every morning.     tamsulosin 0.4 MG Caps capsule  Commonly known as:  FLOMAX  TAKE 1 CAPSULE BY MOUTH ONCE DAILY     temazepam 30 MG capsule  Commonly known as:  RESTORIL  Take 30 mg by mouth at bedtime.           Follow-up Information    Follow up with [facility].   Why:  home health physical therapy   Contact information:   [address] [place] [address] [phone]       Follow up with Gearlean Alf, MD. Schedule an appointment as  soon as possible for a visit on 02/07/2015.   Specialty:  Orthopedic Surgery   Why:  Call office at [phone] to setup appointment with Dr. Wynelle Link on Tuesday 02/07/2015.   Contact information:   [address] [place] [address] [phone]       Signed: Arlee Muslim, PA-C Orthopaedic Surgery 01/27/2015, 8:22 AM

## 2015-01-27 NOTE — Discharge Instructions (Signed)
Dr. Gaynelle Arabian Total Joint Specialist [facility] [address] [place], [place] [address] [phone]  TOTAL KNEE REPLACEMENT POSTOPERATIVE DIRECTIONS  Knee Rehabilitation, Guidelines Following Surgery  Results after knee surgery are often greatly improved when you follow the exercise, range of motion and muscle strengthening exercises prescribed by your doctor. Safety measures are also important to protect the knee from further injury. Any time any of these exercises cause you to have increased pain or swelling in your knee joint, decrease the amount until you are comfortable again and slowly increase them. If you have problems or questions, call your caregiver or physical therapist for advice.   HOME CARE INSTRUCTIONS  Remove items at home which could result in a fall. This includes throw rugs or furniture in walking pathways.   ICE to the affected knee every three hours for 30 minutes at a time and then as needed for pain and swelling.  Continue to use ice on the knee for pain and swelling from surgery. You may notice swelling that will progress down to the foot and ankle.  This is normal after surgery.  Elevate the leg when you are not up walking on it.    Continue to use the breathing machine which will help keep your temperature down.  It is common for your temperature to cycle up and down following surgery, especially at night when you are not up moving around and exerting yourself.  The breathing machine keeps your lungs expanded and your temperature down.  Do not place pillow under knee, focus on keeping the knee straight while resting  DIET You may resume your previous home diet once your are discharged from the hospital.  DRESSING / WOUND CARE / SHOWERING You may change your dressing 3-5 days after surgery.  Then change the dressing every day with sterile gauze.  Please use good hand washing techniques before changing the dressing.  Do not use any  lotions or creams on the incision until instructed by your surgeon. You may start showering once you are discharged home but do not submerge the incision under water. Just pat the incision dry and apply a dry gauze dressing on daily. Change the surgical dressing daily and reapply a dry dressing each time.  ACTIVITY Walk with your walker as instructed. Use walker as long as suggested by your caregivers. Avoid periods of inactivity such as sitting longer than an hour when not asleep. This helps prevent blood clots.  You may resume a sexual relationship in one month or when given the OK by your doctor.  You may return to work once you are cleared by your doctor.  Do not drive a car for 6 weeks or until released by you surgeon.  Do not drive while taking narcotics.  WEIGHT BEARING Weight bearing as tolerated with assist device (walker, cane, etc) as directed, use it as long as suggested by your surgeon or therapist, typically at least 4-6 weeks.  POSTOPERATIVE CONSTIPATION PROTOCOL Constipation - defined medically as fewer than three stools per week and severe constipation as less than one stool per week.  One of the most common issues patients have following surgery is constipation.  Even if you have a regular bowel pattern at home, your normal regimen is likely to be disrupted due to multiple reasons following surgery.  Combination of anesthesia, postoperative narcotics, change in appetite and fluid intake all can affect your bowels.  In order to avoid complications following surgery, here are some recommendations  in order to help you during your recovery period.  Colace (docusate) - Pick up an over-the-counter form of Colace or another stool softener and take twice a day as long as you are requiring postoperative pain medications.  Take with a full glass of water daily.  If you experience loose stools or diarrhea, hold the colace until you stool forms back up.  If your symptoms do not get better  within 1 week or if they get worse, check with your doctor.  Dulcolax (bisacodyl) - Pick up over-the-counter and take as directed by the product packaging as needed to assist with the movement of your bowels.  Take with a full glass of water.  Use this product as needed if not relieved by Colace only.   MiraLax (polyethylene glycol) - Pick up over-the-counter to have on hand.  MiraLax is a solution that will increase the amount of water in your bowels to assist with bowel movements.  Take as directed and can mix with a glass of water, juice, soda, coffee, or tea.  Take if you go more than two days without a movement. Do not use MiraLax more than once per day. Call your doctor if you are still constipated or irregular after using this medication for 7 days in a row.  If you continue to have problems with postoperative constipation, please contact the office for further assistance and recommendations.  If you experience "the worst abdominal pain ever" or develop nausea or vomiting, please contact the office immediatly for further recommendations for treatment.  ITCHING  If you experience itching with your medications, try taking only a single pain pill, or even half a pain pill at a time.  You can also use Benadryl over the counter for itching or also to help with sleep.   TED HOSE STOCKINGS Wear the elastic stockings on both legs for three weeks following surgery during the day but you may remove then at night for sleeping.  MEDICATIONS See your medication summary on the After Visit Summary that the nursing staff will review with you prior to discharge.  You may have some home medications which will be placed on hold until you complete the course of blood thinner medication.  It is important for you to complete the blood thinner medication as prescribed by your surgeon.  Continue your approved medications as instructed at time of discharge.  PRECAUTIONS If you experience chest pain or shortness  of breath - call 911 immediately for transfer to the hospital emergency department.  If you develop a fever greater that 101 F, purulent drainage from wound, increased redness or drainage from wound, foul odor from the wound/dressing, or calf pain - CONTACT YOUR SURGEON.                                                   FOLLOW-UP APPOINTMENTS Make sure you keep all of your appointments after your operation with your surgeon and caregivers. You should call the office at the above phone number and make an appointment for approximately two weeks after the date of your surgery or on the date instructed by your surgeon outlined in the "After Visit Summary".   RANGE OF MOTION AND STRENGTHENING EXERCISES  Rehabilitation of the knee is important following a knee injury or an operation. After just a few days of immobilization, the muscles  of the thigh which control the knee become weakened and shrink (atrophy). Knee exercises are designed to build up the tone and strength of the thigh muscles and to improve knee motion. Often times heat used for twenty to thirty minutes before working out will loosen up your tissues and help with improving the range of motion but do not use heat for the first two weeks following surgery. These exercises can be done on a training (exercise) mat, on the floor, on a table or on a bed. Use what ever works the best and is most comfortable for you Knee exercises include:  Leg Lifts - While your knee is still immobilized in a splint or cast, you can do straight leg raises. Lift the leg to 60 degrees, hold for 3 sec, and slowly lower the leg. Repeat 10-20 times 2-3 times daily. Perform this exercise against resistance later as your knee gets better.  Quad and Hamstring Sets - Tighten up the muscle on the front of the thigh (Quad) and hold for 5-10 sec. Repeat this 10-20 times hourly. Hamstring sets are done by pushing the foot backward against an object and holding for 5-10 sec. Repeat as  with quad sets.   Leg Slides: Lying on your back, slowly slide your foot toward your buttocks, bending your knee up off the floor (only go as far as is comfortable). Then slowly slide your foot back down until your leg is flat on the floor again.  Angel Wings: Lying on your back spread your legs to the side as far apart as you can without causing discomfort.  A rehabilitation program following serious knee injuries can speed recovery and prevent re-injury in the future due to weakened muscles. Contact your doctor or a physical therapist for more information on knee rehabilitation.   IF YOU ARE TRANSFERRED TO A SKILLED REHAB FACILITY If the patient is transferred to a skilled rehab facility following release from the hospital, a list of the current medications will be sent to the facility for the patient to continue.  When discharged from the skilled rehab facility, please have the facility set up the patient's [place] prior to being released. Also, the skilled facility will be responsible for providing the patient with their medications at time of release from the facility to include their pain medication, the muscle relaxants, and their blood thinner medication. If the patient is still at the rehab facility at time of the two week follow up appointment, the skilled rehab facility will also need to assist the patient in arranging follow up appointment in our office and any transportation needs.  MAKE SURE YOU:  Understand these instructions.  Get help right away if you are not doing well or get worse.    Pick up stool softner and laxative for home use following surgery while on pain medications. Do not submerge incision under water. Please use good hand washing techniques while changing dressing each day. May shower starting three days after surgery. Please use a clean towel to pat the incision dry following showers. Continue to use ice for pain and swelling after  surgery. Do not use any lotions or creams on the incision until instructed by your surgeon.  Continue Lovenox injections for 8 more days starting Saturday 01/28/2015.  Once completed the Lovenox, then start a 325 mg Aspirin and take twice a day for three more weeks.

## 2015-01-27 NOTE — Progress Notes (Signed)
Occupational Therapy Treatment Patient Details Name: Dale Morrow MRN: [id number] DOB: May 04, 1941 Today's Date: 01/27/2015    History of present illness revision of L TKA   OT comments  Pt progressing in OT.  Needs min A for sit to stand due to unsteadiness with transitions.  Cues for sequencing as pt is very talkative and gets distracted.  Follow Up Recommendations  No OT follow up;Supervision/Assistance - 24 hour    Equipment Recommendations  None recommended by OT    Recommendations for Other Services      Precautions / Restrictions Precautions Precautions: Knee;Fall Knee Immobilizer - Left: Discontinue once straight leg raise with < 10 degree lag Restrictions Weight Bearing Restrictions: No       Mobility Bed Mobility                  Transfers   Equipment used: Rolling walker (2 wheeled) Transfers: Sit to/from Stand Sit to Stand: Min assist         General transfer comment: assist to steady during transition from sit to stand    Balance                                   ADL                           Toilet Transfer: Minimal assistance;BSC;RW   Toileting- Clothing Manipulation and Hygiene: Minimal assistance;Sit to/from stand         General ADL Comments: pt ambulated to bathroom with min cues for sequencing and guarding from running into CPM parts with RUE.  Pt talks a lot and distracts himself.  He plans to have his wife assist with ADLs.  Also stood with min guard to use urinal      Vision                     Perception     Praxis      Cognition   Behavior During Therapy: [facility] for tasks assessed/performed Overall Cognitive Status: Within Functional Limits for tasks assessed                       Extremity/Trunk Assessment               Exercises     Shoulder Instructions       General Comments      Pertinent Vitals/ Pain       Pain Assessment: No/denies pain  Home Living                                           Prior Functioning/Environment              Frequency Min 2X/week     Progress Toward Goals  OT Goals(current goals can now be found in the care plan section)  Progress towards OT goals: Progressing toward goals     Plan      Co-evaluation                 End of Session CPM Left Knee CPM Left Knee: Off   Activity Tolerance Patient tolerated treatment well   Patient Left in chair;with call bell/phone within reach   Nurse Communication  Time: 3546-5681 OT Time Calculation (min): 23 min  Charges: OT General Charges $OT Visit: 1 Procedure OT Treatments $Self Care/Home Management : 8-22 mins  Jesi Jurgens 01/27/2015, 10:59 AM Lesle Chris, OTR/L [phone] 01/27/2015

## 2015-01-27 NOTE — Progress Notes (Signed)
   Subjective: 2 Days Post-Op Procedure(s) (LRB): LEFT TOTAL KNEE  ARTHOPLASTY REVISION (Left) Patient reports pain as mild.  Pain under better control today. Patient seen in rounds with Dr. Wynelle Link. Did well with therapy yesterday. Patient is well, and has had no acute complaints or problems Patient is ready to go home.  Objective: Vital signs in last 24 hours: Temp:  [98.2 F (36.8 C)-99 F (37.2 C)] 98.6 F (37 C) (04/15 0512) Pulse Rate:  [62-74] 67 (04/15 0512) Resp:  [14-16] 16 (04/15 0512) BP: (115-143)/(51-81) 136/81 mmHg (04/15 0512) SpO2:  [100 %] 100 % (04/15 0512)  Intake/Output from previous day:  Intake/Output Summary (Last 24 hours) at 01/27/15 0807 Last data filed at 01/27/15 0654  Gross per 24 hour  Intake      0 ml  Output   2200 ml  Net  -2200 ml    Labs:  Recent Labs  01/26/15 0513 01/27/15 0514  HGB 9.6* 9.1*    Recent Labs  01/26/15 0513 01/27/15 0514  WBC 7.5 6.7  RBC 3.00* 2.86*  HCT 28.8* 27.7*  PLT 158 152    Recent Labs  01/26/15 0513 01/27/15 0514  NA 134* 132*  K 4.4 4.2  CL 103 98  CO2 27 28  BUN 12 16  CREATININE 0.60 0.77  GLUCOSE 71 292*  CALCIUM 7.6* 8.0*   No results for input(s): LABPT, INR in the last 72 hours.  EXAM: General - Patient is Alert, Appropriate and Oriented Extremity - Neurovascular intact Sensation intact distally Dorsiflexion/Plantar flexion intact Incision - clean, dry, no drainage, healing Motor Function - intact, moving foot and toes well on exam.   Assessment/Plan: 2 Days Post-Op Procedure(s) (LRB): LEFT TOTAL KNEE  ARTHOPLASTY REVISION (Left) Procedure(s) (LRB): LEFT TOTAL KNEE  ARTHOPLASTY REVISION (Left) Past Medical History  Diagnosis Date  . Arthritis     left knee  . Depression   . De Quervain's tenosynovitis, left 04/2012  . Tremors of nervous system     takes propranolol for tremors  . History of radiation therapy 3 years ago  . Prostate cancer, primary, with metastasis  from prostate to other site     metastasis to lymph nodes  . Complication of anesthesia     pt denies ponv, pt woke up during colonscopy in past  . Diabetes mellitus     Insulin pump, takes lisinopril for kidneys, dr Buddy Duty endocrinologist   Principal Problem:   Failed total knee arthroplasty Active Problems:   OA (osteoarthritis) of knee  Estimated body mass index is 30.36 kg/(m^2) as calculated from the following:   Height as of this encounter: 5\' 6"  (1.676 m).   Weight as of this encounter: 85.276 kg (188 lb). Up with therapy Discharge home with home health Diet - Cardiac diet Follow up - in 2 weeks Activity - WBAT Disposition - Home Condition Upon Discharge - Good D/C Meds - See DC Summary DVT Prophylaxis - Lovenox for a total of ten days and then Aspirin 325 mg BID for three weeks.  Dale Muslim, PA-C Orthopaedic Surgery 01/27/2015, 8:07 AM

## 2015-02-13 ENCOUNTER — Other Ambulatory Visit: Payer: Self-pay | Admitting: Neurology

## 2015-02-16 NOTE — Addendum Note (Signed)
Addendum  created 02/16/15 0756 by Kate Sable, MD   Modules edited: Anesthesia Attestations

## 2015-03-10 ENCOUNTER — Emergency Department (HOSPITAL_COMMUNITY)
Admission: EM | Admit: 2015-03-10 | Discharge: 2015-03-10 | Disposition: A | Discharge status: Home / Self Care | Payer: 59 | Attending: Emergency Medicine | Admitting: Emergency Medicine

## 2015-03-10 ENCOUNTER — Encounter (HOSPITAL_COMMUNITY): Payer: Self-pay

## 2015-03-10 ENCOUNTER — Emergency Department (HOSPITAL_COMMUNITY): Payer: 59

## 2015-03-10 DIAGNOSIS — M1712 Unilateral primary osteoarthritis, left knee: Secondary | ICD-10-CM | POA: Insufficient documentation

## 2015-03-10 DIAGNOSIS — Z8719 Personal history of other diseases of the digestive system: Secondary | ICD-10-CM | POA: Diagnosis not present

## 2015-03-10 DIAGNOSIS — Z79899 Other long term (current) drug therapy: Secondary | ICD-10-CM | POA: Insufficient documentation

## 2015-03-10 DIAGNOSIS — R1084 Generalized abdominal pain: Secondary | ICD-10-CM | POA: Insufficient documentation

## 2015-03-10 DIAGNOSIS — Z87891 Personal history of nicotine dependence: Secondary | ICD-10-CM | POA: Diagnosis not present

## 2015-03-10 DIAGNOSIS — E119 Type 2 diabetes mellitus without complications: Secondary | ICD-10-CM | POA: Insufficient documentation

## 2015-03-10 DIAGNOSIS — Z9641 Presence of insulin pump (external) (internal): Secondary | ICD-10-CM | POA: Insufficient documentation

## 2015-03-10 DIAGNOSIS — R110 Nausea: Secondary | ICD-10-CM | POA: Insufficient documentation

## 2015-03-10 DIAGNOSIS — R630 Anorexia: Secondary | ICD-10-CM | POA: Diagnosis not present

## 2015-03-10 DIAGNOSIS — Z7901 Long term (current) use of anticoagulants: Secondary | ICD-10-CM | POA: Diagnosis not present

## 2015-03-10 DIAGNOSIS — L539 Erythematous condition, unspecified: Secondary | ICD-10-CM | POA: Insufficient documentation

## 2015-03-10 DIAGNOSIS — Z923 Personal history of irradiation: Secondary | ICD-10-CM | POA: Insufficient documentation

## 2015-03-10 DIAGNOSIS — Z8546 Personal history of malignant neoplasm of prostate: Secondary | ICD-10-CM | POA: Insufficient documentation

## 2015-03-10 DIAGNOSIS — F329 Major depressive disorder, single episode, unspecified: Secondary | ICD-10-CM | POA: Diagnosis not present

## 2015-03-10 DIAGNOSIS — Z794 Long term (current) use of insulin: Secondary | ICD-10-CM | POA: Insufficient documentation

## 2015-03-10 LAB — HEPATIC FUNCTION PANEL
ALT: 19 U/L (ref 17–63)
AST: 30 U/L (ref 15–41)
Albumin: 3.5 g/dL (ref 3.5–5.0)
Alkaline Phosphatase: 66 U/L (ref 38–126)
Total Bilirubin: 0.3 mg/dL (ref 0.3–1.2)
Total Protein: 6.8 g/dL (ref 6.5–8.1)

## 2015-03-10 LAB — CBC WITH DIFFERENTIAL/PLATELET
BASOS ABS: 0 10*3/uL (ref 0.0–0.1)
Basophils Relative: 1 % (ref 0–1)
Eosinophils Absolute: 0.3 10*3/uL (ref 0.0–0.7)
Eosinophils Relative: 6 % — ABNORMAL HIGH (ref 0–5)
HEMATOCRIT: 32.9 % — AB (ref 39.0–52.0)
Hemoglobin: 10.2 g/dL — ABNORMAL LOW (ref 13.0–17.0)
Lymphocytes Relative: 22 % (ref 12–46)
Lymphs Abs: 1.2 10*3/uL (ref 0.7–4.0)
MCH: 28.3 pg (ref 26.0–34.0)
MCHC: 31 g/dL (ref 30.0–36.0)
MCV: 91.4 fL (ref 78.0–100.0)
MONOS PCT: 9 % (ref 3–12)
Monocytes Absolute: 0.5 10*3/uL (ref 0.1–1.0)
Neutro Abs: 3.4 10*3/uL (ref 1.7–7.7)
Neutrophils Relative %: 62 % (ref 43–77)
Platelets: 218 10*3/uL (ref 150–400)
RBC: 3.6 MIL/uL — ABNORMAL LOW (ref 4.22–5.81)
RDW: 14.7 % (ref 11.5–15.5)
WBC: 5.4 10*3/uL (ref 4.0–10.5)

## 2015-03-10 LAB — I-STAT CHEM 8, ED
BUN: 20 mg/dL (ref 6–20)
CHLORIDE: 101 mmol/L (ref 101–111)
Calcium, Ion: 1.11 mmol/L — ABNORMAL LOW (ref 1.13–1.30)
Creatinine, Ser: 0.8 mg/dL (ref 0.61–1.24)
GLUCOSE: 137 mg/dL — AB (ref 65–99)
HCT: 34 % — ABNORMAL LOW (ref 39.0–52.0)
Hemoglobin: 11.6 g/dL — ABNORMAL LOW (ref 13.0–17.0)
Potassium: 4.4 mmol/L (ref 3.5–5.1)
SODIUM: 137 mmol/L (ref 135–145)
TCO2: 22 mmol/L (ref 0–100)

## 2015-03-10 LAB — URINALYSIS, ROUTINE W REFLEX MICROSCOPIC
Bilirubin Urine: NEGATIVE
GLUCOSE, UA: NEGATIVE mg/dL
Hgb urine dipstick: NEGATIVE
KETONES UR: NEGATIVE mg/dL
LEUKOCYTES UA: NEGATIVE
NITRITE: NEGATIVE
PROTEIN: NEGATIVE mg/dL
Specific Gravity, Urine: 1.015 (ref 1.005–1.030)
UROBILINOGEN UA: 0.2 mg/dL (ref 0.0–1.0)
pH: 7.5 (ref 5.0–8.0)

## 2015-03-10 LAB — LIPASE, BLOOD: LIPASE: 23 U/L (ref 22–51)

## 2015-03-10 LAB — CBG MONITORING, ED
Glucose-Capillary: 47 mg/dL — ABNORMAL LOW (ref 65–99)
Glucose-Capillary: 145 mg/dL — ABNORMAL HIGH (ref 65–99)

## 2015-03-10 LAB — I-STAT CG4 LACTIC ACID, ED: Lactic Acid, Venous: 1.31 mmol/L (ref 0.5–2.0)

## 2015-03-10 LAB — I-STAT TROPONIN, ED: Troponin i, poc: 0 ng/mL (ref 0.00–0.08)

## 2015-03-10 LAB — POC OCCULT BLOOD, ED: Fecal Occult Bld: NEGATIVE

## 2015-03-10 MED ORDER — SODIUM CHLORIDE 0.9 % IV BOLUS (SEPSIS)
1000.0000 mL | Freq: Once | INTRAVENOUS | Status: AC
  Administered 2015-03-10: 1000 mL via INTRAVENOUS

## 2015-03-10 MED ORDER — HYDROMORPHONE HCL 1 MG/ML IJ SOLN
1.0000 mg | Freq: Once | INTRAMUSCULAR | Status: AC
  Administered 2015-03-10: 1 mg via INTRAVENOUS
  Filled 2015-03-10: qty 1

## 2015-03-10 MED ORDER — IOHEXOL 300 MG/ML  SOLN
100.0000 mL | Freq: Once | INTRAMUSCULAR | Status: AC | PRN
  Administered 2015-03-10: 100 mL via INTRAVENOUS

## 2015-03-10 MED ORDER — MORPHINE SULFATE 4 MG/ML IJ SOLN
6.0000 mg | Freq: Once | INTRAMUSCULAR | Status: AC
  Administered 2015-03-10: 6 mg via INTRAVENOUS
  Filled 2015-03-10: qty 2

## 2015-03-10 MED ORDER — DEXTROSE 50 % IV SOLN
INTRAVENOUS | Status: AC
  Administered 2015-03-10: 50 mL
  Filled 2015-03-10: qty 50

## 2015-03-10 MED ORDER — IOHEXOL 300 MG/ML  SOLN
50.0000 mL | Freq: Once | INTRAMUSCULAR | Status: AC | PRN
  Administered 2015-03-10: 50 mL via ORAL

## 2015-03-10 MED ORDER — ONDANSETRON HCL 4 MG/2ML IJ SOLN
4.0000 mg | Freq: Once | INTRAMUSCULAR | Status: AC
  Administered 2015-03-10: 4 mg via INTRAVENOUS
  Filled 2015-03-10: qty 2

## 2015-03-10 NOTE — Discharge Instructions (Signed)
You have been evaluated for your abdominal pain. No acute finding were found from today's visit.  Please follow up closely with your doctor for further care.  Return to ER if your condition worsen.    Abdominal Pain Many things can cause abdominal pain. Usually, abdominal pain is not caused by a disease and will improve without treatment. It can often be observed and treated at home. Your health care provider will do a physical exam and possibly order blood tests and X-rays to help determine the seriousness of your pain. However, in many cases, more time must pass before a clear cause of the pain can be found. Before that point, your health care provider may not know if you need more testing or further treatment. HOME CARE INSTRUCTIONS  Monitor your abdominal pain for any changes. The following actions may help to alleviate any discomfort you are experiencing:  Only take over-the-counter or prescription medicines as directed by your health care provider.  Do not take laxatives unless directed to do so by your health care provider.  Try a clear liquid diet (broth, tea, or water) as directed by your health care provider. Slowly move to a bland diet as tolerated. SEEK MEDICAL CARE IF:  You have unexplained abdominal pain.  You have abdominal pain associated with nausea or diarrhea.  You have pain when you urinate or have a bowel movement.  You experience abdominal pain that wakes you in the night.  You have abdominal pain that is worsened or improved by eating food.  You have abdominal pain that is worsened with eating fatty foods.  You have a fever. SEEK IMMEDIATE MEDICAL CARE IF:   Your pain does not go away within 2 hours.  You keep throwing up (vomiting).  Your pain is felt only in portions of the abdomen, such as the right side or the left lower portion of the abdomen.  You pass bloody or black tarry stools. MAKE SURE YOU:  Understand these instructions.   Will watch your  condition.   Will get help right away if you are not doing well or get worse.  Document Released: 07/10/2005 Document Revised: 10/05/2013 Document Reviewed: 06/09/2013 [facility] Patient Information 2015 [place], [place]. This information is not intended to replace advice given to you by your health care provider. Make sure you discuss any questions you have with your health care provider.

## 2015-03-10 NOTE — ED Notes (Signed)
Patient transported to CT 

## 2015-03-10 NOTE — ED Notes (Signed)
Patient requested IV Team to start his IV. Order placed for IV Team to start IV.

## 2015-03-10 NOTE — ED Notes (Signed)
Patient states that he has had generalized abdominal pain, weakness, and intermittent diarrheal episodes x 1 1/2 weeks. Patient denies any  obvious blood in his stool.

## 2015-03-10 NOTE — ED Notes (Signed)
Pt's CBG at this time = 145.

## 2015-03-10 NOTE — ED Provider Notes (Signed)
CSN: [id number]     Arrival date & time 03/10/15  1357 History   First MD Initiated Contact with Patient 03/10/15 1508     Chief Complaint  Patient presents with  . Abdominal Pain     (Consider location/radiation/quality/duration/timing/severity/associated sxs/prior Treatment) HPI   74 year old male with history of insulin-dependent diabetes, depression, metastatic prostate cancer, diverticulosis presents here for evaluation of abdominal pain. Patient reports for the past 10 days he has had progressively worsening diffuse abdominal pain. He described pain as an achy and dull sensation that is constant, moderate in severity with associated nausea but without vomiting. He does endorse having several bouts of nonbloody nonbilious loose stools daily. He has no appetite. Endorse occasional chills. He has tried over-the-counter medication including Maalox with minimal improvement. He did develop left leg cellulitis more than a week ago and was placed on Keflex which he took for 4 days but subsequently discontinued thinking that it may cause his symptoms. He was also seen at his primary care provider office yesterday for these complaints. It would felt that his symptoms could be related to gastritis and he was given additional medication including antinausea medication which has not provided adequate relief. He is here for further evaluation. He denies any recent travel, food exposure, or sick contact. He also denies having fever, headache, neck stiffness, chest pain, shortness of breath, productive cough, URI symptoms, dysuria, hematochezia or melena.  Past Medical History  Diagnosis Date  . Arthritis     left knee  . Depression   . De Quervain's tenosynovitis, left 04/2012  . Tremors of nervous system     takes propranolol for tremors  . History of radiation therapy 3 years ago  . Prostate cancer, primary, with metastasis from prostate to other site     metastasis to lymph nodes  . Complication of  anesthesia     pt denies ponv, pt woke up during colonscopy in past  . Diabetes mellitus     Insulin pump, takes lisinopril for kidneys, dr Buddy Duty endocrinologist   Past Surgical History  Procedure Laterality Date  . Hemorrhoid surgery  1970s  . Anterior cruciate ligament repair      right knee  . Wrist arthroscopy  05/03/2010    right; and release of 1st dorsal compartment  . Knee arthrotomy  03/18/2008    left; with scar exc.  [name] Knee arthroscopy  09/25/2007; 04/15/2005    left  . Knee joint manipulation  06/29/2007    left  . Total knee arthroplasty  04/29/2007    left  . Dorsal compartment release  04/21/2012    Procedure: RELEASE DORSAL COMPARTMENT (DEQUERVAIN);  Surgeon: Cammie Sickle., MD;  Location: [facility];  Service: Orthopedics;  Laterality: Left;  left 1st dorsal compartment release left wrist   . Minor amputation of digit Left 07/21/2013    Procedure: LEFT SMALL REVISION AMPUTATION (MINOR PROCEDURE) ;  Surgeon: Schuyler Amor, MD;  Location: [place];  Service: Orthopedics;  Laterality: Left;  . Colonoscopy with propofol N/A 04/07/2014    Procedure: COLONOSCOPY WITH PROPOFOL;  Surgeon: Jeryl Columbia, MD;  Location: [facility];  Service: Endoscopy;  Laterality: N/A;  . Total knee revision Left 01/25/2015    Procedure: LEFT TOTAL KNEE  ARTHOPLASTY REVISION;  Surgeon: Gaynelle Arabian, MD;  Location: [facility];  Service: Orthopedics;  Laterality: Left;   Family History  Problem Relation Age of Onset  . Heart Problems Father    History  Substance Use Topics  . Smoking status: Former Smoker -- 15 years    Types: Cigarettes    Quit date: 10/13/1979  . Smokeless tobacco: Never Used  . Alcohol Use: No    Review of Systems  All other systems reviewed and are negative.     Allergies  Bee venom and Shellfish allergy  Home Medications   Prior to Admission medications   Medication Sig Start Date End Date Taking? Authorizing Provider   denosumab (PROLIA) 60 MG/ML SOLN injection Inject 60 mg into the skin every 6 (six) months. Administer in upper arm, thigh, or abdomen   Yes Historical Provider, MD  escitalopram (LEXAPRO) 20 MG tablet TAKE 1 TABLET BY MOUTH ONCE DAILY Patient taking differently: TAKE 20 MG BY MOUTH ONCE DAILY 04/11/14  Yes Marcial Pacas, MD  HYDROcodone-acetaminophen (NORCO/VICODIN) 5-325 MG per tablet Take 1 tablet by mouth every 6 (six) hours as needed for severe pain.  08/12/14  Yes Historical Provider, MD  Insulin Human (INSULIN PUMP) SOLN Inject into the skin. Uses humalog insulin basal rate 1200 am= 1.2, 300 am= 1.2, 600 am=0.85, 500 pm=9.0   Yes Historical Provider, MD  lisinopril (PRINIVIL,ZESTRIL) 10 MG tablet Take 10 mg by mouth every morning.    Yes Historical Provider, MD  megestrol (MEGACE) 20 MG tablet Take 20 mg by mouth 2 (two) times daily.   Yes Historical Provider, MD  polyethylene glycol powder (GLYCOLAX/MIRALAX) powder Take 17 g by mouth at bedtime.    Yes Historical Provider, MD  primidone (MYSOLINE) 50 MG tablet TAKE 2 TABLETS BY MOUTH 3 TIMES A DAY Patient taking differently: TAKE 100 MG BY MOUTH 3 TIMES A DAY 02/13/15  Yes Marcial Pacas, MD  propranolol (INDERAL) 60 MG tablet Take 1 tablet (60 mg total) by mouth 2 (two) times daily. 09/30/14  Yes Marcial Pacas, MD  rosuvastatin (CRESTOR) 10 MG tablet Take 10 mg by mouth every morning.    Yes Historical Provider, MD  tamsulosin (FLOMAX) 0.4 MG CAPS capsule TAKE 1 CAPSULE BY MOUTH ONCE DAILY Patient taking differently: TAKE 0.4 MG BY MOUTH DAILY   Yes Eppie Gibson, MD  temazepam (RESTORIL) 30 MG capsule Take 30 mg by mouth at bedtime.    Yes Historical Provider, MD  enoxaparin (LOVENOX) 40 MG/0.4ML injection Inject 0.4 mLs (40 mg total) into the skin daily. Take injections for 8 more days starting Saturday 01/28/2015.  Once completed the Lovenox, then start a 325 mg Aspirin and take twice a day for three more weeks. Patient not taking: Reported on 03/10/2015  01/27/15   Arlee Muslim, PA-C  HYDROmorphone (DILAUDID) 4 MG tablet Take 1-2 tablets (4-8 mg total) by mouth every 4 (four) hours as needed for moderate pain or severe pain. Patient not taking: Reported on 03/10/2015 01/27/15   Arlee Muslim, PA-C  Leuprolide Acetate, 6 Month, (LUPRON) 45 MG injection Inject 45 mg into the muscle every 6 (six) months.    Historical Provider, MD  methocarbamol (ROBAXIN) 500 MG tablet Take 1 tablet (500 mg total) by mouth every 6 (six) hours as needed for muscle spasms. Patient not taking: Reported on 03/10/2015 01/27/15   Arlee Muslim, PA-C   BP 145/69 mmHg  Pulse 57  Temp(Src) 98 F (36.7 C) (Oral)  Resp 16  SpO2 100% Physical Exam  Constitutional: He appears well-developed and well-nourished. No distress.  Well-appearing Caucasian male appears at his stated age. Awake, alert, nontoxic appearance  HENT:  Head: Atraumatic.  Eyes: Conjunctivae are normal. Right eye exhibits no  discharge. Left eye exhibits no discharge.  Neck: Normal range of motion. Neck supple.  Cardiovascular: Normal rate and regular rhythm.   Pulmonary/Chest: Effort normal. No respiratory distress. He exhibits no tenderness.  Abdominal: Soft. There is tenderness (Tenderness to suprapubic and right lower quadrant on palpation with guarding but without rebound tenderness.). There is no rebound.  Musculoskeletal: He exhibits no tenderness.  ROM appears intact, no obvious focal weakness  Neurological: He is alert.  Skin: Skin is warm and dry. No rash (erythema noted to L anterior knee with normal knee flexion/extension.  tenderness to palpation) noted.  Psychiatric: He has a normal mood and affect.  Nursing note and vitals reviewed.   ED Course  Procedures (including critical care time)  3:31 PM Patient with progressive worsening abdominal pain and decreased appetite. He will need an abdominal CT scan to rule out acute emergent condition such as appendicitis, colitis, diverticulitis or  cancer associated palpitations. Pain medication and antinausea medication given.  6:49 PM Pt has insulin pump that is currently running.  CBG is 47.  Pt resting comfortably, no AMS.  Will give 1 amp of D50.    12:46 AM Abdominal pelvis CT scan shows no acute finding to explain patient's symptoms. Patient does have an inguinal hernia containing fat only. His labs are reassuring. Improved CBG after receiving an ampule of D50. I encouraged patient to follow-up with PCP for further management of his condition. Return precautions discussed. Patient stable for discharge. Care discussed with DR. Mingo Amber.  Labs Review Labs Reviewed  CBC WITH DIFFERENTIAL/PLATELET - Abnormal; Notable for the following:    RBC 3.60 (*)    Hemoglobin 10.2 (*)    HCT 32.9 (*)    Eosinophils Relative 6 (*)    All other components within normal limits  HEPATIC FUNCTION PANEL - Abnormal; Notable for the following:    Bilirubin, Direct <0.1 (*)    All other components within normal limits  I-STAT CHEM 8, ED - Abnormal; Notable for the following:    Glucose, Bld 137 (*)    Calcium, Ion 1.11 (*)    Hemoglobin 11.6 (*)    HCT 34.0 (*)    All other components within normal limits  CBG MONITORING, ED - Abnormal; Notable for the following:    Glucose-Capillary 47 (*)    All other components within normal limits  CBG MONITORING, ED - Abnormal; Notable for the following:    Glucose-Capillary 145 (*)    All other components within normal limits  URINALYSIS, ROUTINE W REFLEX MICROSCOPIC (NOT AT [facility])  LIPASE, BLOOD  POC OCCULT BLOOD, ED  I-STAT CG4 LACTIC ACID, ED  I-STAT TROPOININ, ED    Imaging Review Ct Abdomen Pelvis W Contrast  03/10/2015   CLINICAL DATA:  Abdominal pain  EXAM: CT ABDOMEN AND PELVIS WITH CONTRAST  TECHNIQUE: Multidetector CT imaging of the abdomen and pelvis was performed using the standard protocol following bolus administration of intravenous contrast.  CONTRAST:  180mL OMNIPAQUE IOHEXOL 300  MG/ML  SOLN  COMPARISON:  03/30/2014  FINDINGS: Lower chest:  No pleural effusion.  The lung bases appear clear.  Hepatobiliary: Low-attenuation structure within the left hepatic lobe is stable measuring 6 mm. Too small to characterize but likely small cysts. The gallbladder is normal. No biliary dilatation.  Pancreas: Negative  Spleen: Negative  Adrenals/Urinary Tract: Negative appearance of the adrenal glands. The right kidney appears normal. Stone within the inferior pole of the right kidney measures 3 mm, image 31/series 2. No hydronephrosis  or hydroureter.  Stomach/Bowel: The stomach is within normal limits. The small bowel loops have a normal course and caliber. No obstruction. Normal appearance of the colon. The appendix is visualized and appears normal.  Vascular/Lymphatic: Calcified atherosclerotic disease involves the abdominal aorta. No aneurysm. No enlarged retroperitoneal or mesenteric adenopathy. No enlarged pelvic or inguinal lymph nodes.  Reproductive: Seed implants are noted within the prostate gland.  Other: There is no ascites or focal fluid collections within the abdomen or pelvis. Small fat containing inguinal hernias noted. There is also a small fat containing umbilical hernia.  Musculoskeletal: Negative.  IMPRESSION: 1. No acute findings identified within the abdomen or pelvis. 2. Aortic atherosclerosis. 3. Inguinal hernias contain fat only.   Electronically Signed   By: Kerby Moors M.D.   On: 03/10/2015 19:39     EKG Interpretation None      MDM   Final diagnoses:  Generalized abdominal pain    BP 128/56 mmHg  Pulse 67  Temp(Src) 98 F (36.7 C) (Oral)  Resp 18  SpO2 98%  I have reviewed nursing notes and vital signs. I personally reviewed the imaging tests through PACS system  I reviewed available ER/hospitalization records thought the EMR     Domenic Moras, PA-C 03/11/15 0048  Evelina Bucy, MD 03/11/15 [phone]

## 2015-03-22 ENCOUNTER — Other Ambulatory Visit (HOSPITAL_COMMUNITY): Payer: Self-pay | Admitting: Gastroenterology

## 2015-03-22 DIAGNOSIS — R110 Nausea: Secondary | ICD-10-CM

## 2015-03-22 DIAGNOSIS — R1084 Generalized abdominal pain: Secondary | ICD-10-CM

## 2015-03-28 ENCOUNTER — Encounter: Payer: Self-pay | Admitting: Neurology

## 2015-03-28 ENCOUNTER — Ambulatory Visit: Payer: 59 | Attending: Orthopedic Surgery

## 2015-03-28 ENCOUNTER — Ambulatory Visit (INDEPENDENT_AMBULATORY_CARE_PROVIDER_SITE_OTHER): Payer: 59 | Admitting: Neurology

## 2015-03-28 ENCOUNTER — Telehealth: Payer: Self-pay | Admitting: Neurology

## 2015-03-28 VITALS — BP 139/70 | HR 57 | Ht 66.0 in | Wt 188.0 lb

## 2015-03-28 DIAGNOSIS — M25662 Stiffness of left knee, not elsewhere classified: Secondary | ICD-10-CM | POA: Insufficient documentation

## 2015-03-28 DIAGNOSIS — G513 Clonic hemifacial spasm: Secondary | ICD-10-CM

## 2015-03-28 DIAGNOSIS — M7989 Other specified soft tissue disorders: Secondary | ICD-10-CM | POA: Insufficient documentation

## 2015-03-28 DIAGNOSIS — R29898 Other symptoms and signs involving the musculoskeletal system: Secondary | ICD-10-CM | POA: Diagnosis not present

## 2015-03-28 DIAGNOSIS — G5139 Clonic hemifacial spasm, unspecified: Secondary | ICD-10-CM

## 2015-03-28 DIAGNOSIS — G250 Essential tremor: Secondary | ICD-10-CM

## 2015-03-28 MED ORDER — CLONAZEPAM 0.5 MG PO TABS: 0.5000 mg | ORAL_TABLET | Freq: Two times a day (BID) | ORAL | Status: DC | PRN

## 2015-03-28 MED ORDER — PROPRANOLOL HCL 80 MG PO TABS: 80.0000 mg | ORAL_TABLET | Freq: Two times a day (BID) | ORAL | Status: DC

## 2015-03-28 MED ORDER — PRIMIDONE 50 MG PO TABS: 100.0000 mg | ORAL_TABLET | Freq: Three times a day (TID) | ORAL | Status: DC

## 2015-03-28 MED ORDER — TRAZODONE HCL 50 MG PO TABS: 50.0000 mg | ORAL_TABLET | Freq: Every day | ORAL | Status: DC

## 2015-03-28 NOTE — Therapy (Addendum)
[facility] [address] [place], [place], [address] Phone: [phone]   Fax:  [phone]  Physical Therapy Evaluation  Patient Details  Name: Dale Morrow MRN: [id number] Date of Birth: 09-12-1941 Referring Provider:  Gaynelle Arabian, MD  Encounter Date: 03/28/2015      PT End of Session - 03/28/15 1536    Visit Number 1   Number of Visits 10   Date for PT Re-Evaluation 05/23/15   PT Start Time 9798   PT Stop Time 1542   PT Time Calculation (min) 59 min   Activity Tolerance Patient tolerated treatment well   Behavior During Therapy [facility] for tasks assessed/performed      Past Medical History  Diagnosis Date  . Arthritis     left knee  . Depression   . De Quervain's tenosynovitis, left 04/2012  . Tremors of nervous system     takes propranolol for tremors  . History of radiation therapy 3 years ago  . Prostate cancer, primary, with metastasis from prostate to other site     metastasis to lymph nodes  . Complication of anesthesia     pt denies ponv, pt woke up during colonscopy in past  . Diabetes mellitus     Insulin pump, takes lisinopril for kidneys, dr Buddy Duty endocrinologist    Past Surgical History  Procedure Laterality Date  . Hemorrhoid surgery  1970s  . Anterior cruciate ligament repair      right knee  . Wrist arthroscopy  05/03/2010    right; and release of 1st dorsal compartment  . Knee arthrotomy  03/18/2008    left; with scar exc.  [name] Knee arthroscopy  09/25/2007; 04/15/2005    left  . Knee joint manipulation  06/29/2007    left  . Total knee arthroplasty  04/29/2007    left  . Dorsal compartment release  04/21/2012    Procedure: RELEASE DORSAL COMPARTMENT (DEQUERVAIN);  Surgeon: Cammie Sickle., MD;  Location: [facility];  Service: Orthopedics;  Laterality: Left;  left 1st dorsal compartment release left wrist   . Minor amputation of digit Left 07/21/2013    Procedure: LEFT  SMALL REVISION AMPUTATION (MINOR PROCEDURE) ;  Surgeon: Schuyler Amor, MD;  Location: [place];  Service: Orthopedics;  Laterality: Left;  . Colonoscopy with propofol N/A 04/07/2014    Procedure: COLONOSCOPY WITH PROPOFOL;  Surgeon: Jeryl Columbia, MD;  Location: [facility];  Service: Endoscopy;  Laterality: N/A;  . Total knee revision Left 01/25/2015    Procedure: LEFT TOTAL KNEE  ARTHOPLASTY REVISION;  Surgeon: Gaynelle Arabian, MD;  Location: [facility];  Service: Orthopedics;  Laterality: Left;    There were no vitals filed for this visit.  Visit Diagnosis:  Knee stiffness, left - Plan: PT plan of care cert/re-cert  Weakness of left lower extremity - Plan: PT plan of care cert/re-cert  Swelling of limb - Plan: PT plan of care cert/re-cert      Subjective Assessment - 03/28/15 1446    Subjective Had a previous knee injury in 2000s after skiing accident had surgery on broken tibia;  2009 had a TKR, 2010: took out scar tissue; 2016: total knee arthoplasty    How long can you sit comfortably? Not limited    How long can you stand comfortably? 15-20 minutes    How long can you walk comfortably? 15 minutes; has not tried anymore    Patient Stated Goals Get ROM back,  ride mountain bike, working out at [employer] (ellipitical, 1 hour cardio) weight program    Currently in Pain? Yes   Pain Score 6    Pain Location Knee   Pain Orientation Left   Pain Descriptors / Indicators Aching   Pain Type Acute pain;Surgical pain   Pain Onset More than a month ago   Pain Frequency Intermittent   Aggravating Factors  Standing, walking, bending it    Pain Relieving Factors Pain pills, ice            [facility] PT Assessment - 03/28/15 0001    Assessment   Medical Diagnosis Z96.652 - Status post revision of total replacement of the knee    Onset Date/Surgical Date 01/26/15   Next MD Visit June 28th    Prior Therapy 8 weeks of outpatient PT   Precautions   Precautions Fall  hx of CA    Restrictions   Weight Bearing Restrictions No   Balance Screen   Has the patient fallen in the past 6 months No   Has the patient had a decrease in activity level because of a fear of falling?  No   Is the patient reluctant to leave their home because of a fear of falling?  No   Home Environment   Living Environment Private residence   Living Arrangements Spouse/significant other   Type of [place] to enter   Entrance Stairs-Number of Steps Front door: 2 steps; none at garage   [place] Two level   Alternate Level Stairs-Number of Steps 14; railing on one side    [place] - single point;Walker - 2 wheels;Tub bench;Grab bars - toilet   Prior Function   Level of Independence Independent   Vocation Retired   Leisure Working out: 1 hour cardio, light weights; biking, skiing,    cut the grass with riding Sports coach   Overall Cognitive Status Within Functional Limits for tasks assessed   Observation/Other Assessments   Focus on Therapeutic Outcomes (FOTO)  54% limitation   Observation/Other Assessments-Edema    Edema Circumferential  Lt: 18.75 in; Rt: 16 in.    ROM / Strength   AROM / PROM / Strength AROM;PROM;Strength   AROM   Overall AROM  Deficits   Overall AROM Comments Active Lt. flexion: 85 degrees; extension: -10   PROM   Overall PROM  Deficits   Overall PROM Comments Lt. knee flexion: 87;  knee extension: -8    Strength   Overall Strength Deficits   Overall Strength Comments Lt hip 4/5; knee extension: 4/5; knee flexion: 5/5; DF: 5/5   Rt LE motions 5/5    Flexibility   Soft Tissue Assessment /Muscle Length yes   Palpation   Palpation comment Incision length: 8 in.    Ambulation/Gait   Ambulation/Gait Yes   Ambulation/Gait Assistance 6: Modified independent (Device/Increase time)  Uses SPC for community ambulation    Gait Comments Decreased gait speed                    [facility] Adult PT Treatment/Exercise -  03/28/15 0001    Modalities   Modalities Vasopneumatic   Vasopneumatic   Number Minutes Vasopneumatic  15 minutes   Vasopnuematic Location  Knee   Vasopneumatic Pressure Medium   Vasopneumatic Temperature  3 snowflakes                  PT Short Term Goals -  2015-04-10 1541    PT SHORT TERM GOAL #1   Title Independent with HEP    Time 4   Period Weeks   Status New   PT SHORT TERM GOAL #2   Title Improve Lt knee flexion AROM to 95 degrees to improve ability to do elliptical    Time 4   Period Weeks   Status New   PT SHORT TERM GOAL #3   Title Improve Lt knee extension AROM to -5 for fluid gait pattern    Time 4   Period Weeks   Status New           PT Long Term Goals - 04/10/15 1548    PT LONG TERM GOAL #1   Title Be independent with advanced HEP    Time 8   Period Weeks   Status New   PT LONG TERM GOAL #2   Title Improve FOTO disabiltiy to < or = to 43%   Time 8   Status New   PT LONG TERM GOAL #3   Title Increase LE strength to return to 1 hour cardio workout on elliptical    Time 8   Period Weeks   Status New   PT LONG TERM GOAL #4   Title Improve knee flexion AROM to 105 in order to use riding lawnmower    Time 8   Period Weeks   Status New   PT LONG TERM GOAL #5   Title wean from the cane to no device for community distances > or = to 75% of the time   Time 8   Period Weeks   Status New               Plan - April 10, 2015 1537    Clinical Impression Statement 74 y.o male post TKA from April 2016. Has had previous TKA in 2009, revision in 2010. Previously seen in outpatient  PT for the last 8 weeks. Presents with decreased Lt knee strength, ROM and increased swelling. Will benefit from skilled  PT for improving strength, ROM and reducing edema for return to functional activities.    Pt will benefit from skilled therapeutic intervention in order to improve on the following deficits Decreased range of motion;Difficulty walking;Decreased  endurance;Decreased activity tolerance;Pain;Decreased scar mobility;Increased edema;Decreased strength   Rehab Potential Good   PT Frequency 3x / week   PT Duration 8 weeks   PT Treatment/Interventions Moist Heat;Traction;Ultrasound;Vasopneumatic Device;Cryotherapy;Electrical Stimulation;Gait training;Functional mobility training;Therapeutic exercise;Neuromuscular re-education;Patient/family education;Passive range of motion;Energy conservation   PT Next Visit Plan Begin on bike, start with leg press, step up/lateral step up, mini squats    Consulted and Agree with Plan of Care Patient          G-Codes - 04/10/2015 1439    Functional Assessment Tool Used FOTO: 54% limitation   Functional Limitation Mobility: Walking and moving around   Mobility: Walking and Moving Around Current Status [phone]) At least 40 percent but less than 60 percent impaired, limited or restricted   Mobility: Walking and Moving Around Goal Status [phone]) At least 40 percent but less than 60 percent impaired, limited or restricted       Problem List Patient Active Problem List   Diagnosis Date Noted  . Failed total knee arthroplasty 01/25/2015  . OA (osteoarthritis) of knee 01/25/2015  . Essential tremor 03/09/2014  . Facial spasm 04/14/2013  . Arthritis   . Depression   . Hypertension   . Prostate cancer, primary, with metastasis from prostate  to other site   . PONV (postoperative nausea and vomiting)   . De Quervain's tenosynovitis, left 04/13/2012   Reginal Lutes, SPT 03/28/2015 4:31 PM   During this treatment session, the therapist was present, participating in, and directing the treatment.  TAKACS,KELLY 03/28/2015, 4:31 PM  [facility] [address] [place], [place], [address] Phone: [phone]   Fax:  [phone]

## 2015-03-28 NOTE — Progress Notes (Signed)
Dale Morrow is a 74 years old referred by primary care physician for evaluation of tremor and left hemifacial spasm.  He has family history of tremor, mother at her old age,  his sister, maternal uncle all had bilateral hand tremor, he also has past medical history of hypertension, diabetes, insulin pump, hyperlipidemia, prostate cancer, status post radiation therapy history of left knee replacement ,  He  has a history of bilateral Bell's palsy, with residual left upper and lower face weakness  He noticed bilateral hand shaking since 2004,  difficulty writing, holding utensils, around 2009, he also noticed voice change, there was a tremorous quality to it  He denies loss sense of smell, and there was no REM sleep disorder, no gait difficulty. TSH was normal.   He was placed on propranolol in 2013, with moderate response, with bp at low normal side 100/66, primidone 50mg  qhs was added on since Nov 2013, He reported mild  improvement with primidone 50 mg 2 tablets twice a day   I saw him regularly in the past for EMG guided xeomin injection since November 2014, he responded very well, last injection was in May of 2015. He had bilateral blephoplasty by [facility] ophthalmologist in February 2016,  which has helped his left facial spasm, he also had left knee redo in April 2016, still complains of moderate constant left knee pain, on Dilaudid, oxycodone treatment, is going for rehabilitation, sometimes his left knee is so painful, make him nauseous,  He came back to clinic today complains of worsening bilateral hands tremor, difficulty holding a cup of coffee, holding utensils, cause inconvenient, social embarrassment, also noticed head titubation, increase voice tremor, needs to repeat himself sometimes.  He is now taking propranolol 60 mg twice a day, primidone 50 mg 2 tablets 3 times a day, propranolol works better for him, blood pressure was in the range of 130-140.     REVIEW OF SYSTEMS:  Full 14 system review of systems performed and notable only for  joint pain, joint swelling tremor, depression, 11   ALLERGIES: Allergies  Allergen Reactions  . Bee Venom Anaphylaxis  . Shellfish Allergy Anaphylaxis    HOME MEDICATIONS: Current Outpatient Prescriptions  Medication Sig Dispense Refill  . denosumab (PROLIA) 60 MG/ML SOLN injection Inject 60 mg into the skin every 6 (six) months. Administer in upper arm, thigh, or abdomen    . enoxaparin (LOVENOX) 40 MG/0.4ML injection Inject 0.4 mLs (40 mg total) into the skin daily. Take injections for 8 more days starting Saturday 01/28/2015.  Once completed the Lovenox, then start a 325 mg Aspirin and take twice a day for three more weeks. 8 Syringe 0  . escitalopram (LEXAPRO) 20 MG tablet TAKE 1 TABLET BY MOUTH ONCE DAILY (Patient taking differently: TAKE 20 MG BY MOUTH ONCE DAILY) 90 tablet 3  . Esomeprazole Magnesium (NEXIUM 24HR PO) Take 1 tablet by mouth.    [name] HYDROcodone-acetaminophen (NORCO/VICODIN) 5-325 MG per tablet Take 1 tablet by mouth every 6 (six) hours as needed for severe pain.     [name] HYDROmorphone (DILAUDID) 4 MG tablet Take 1-2 tablets (4-8 mg total) by mouth every 4 (four) hours as needed for moderate pain or severe pain. 90 tablet 0  . Insulin Human (INSULIN PUMP) SOLN Inject into the skin. Uses humalog insulin basal rate 1200 am= 1.2, 300 am= 1.2, 600 am=0.85, 500 pm=9.0    . Leuprolide Acetate, 6 Month, (LUPRON) 45 MG injection Inject 45 mg into the muscle  every 6 (six) months.    [name] lisinopril (PRINIVIL,ZESTRIL) 10 MG tablet Take 10 mg by mouth every morning.     . megestrol (MEGACE) 20 MG tablet Take 20 mg by mouth 2 (two) times daily.    . methocarbamol (ROBAXIN) 500 MG tablet Take 1 tablet (500 mg total) by mouth every 6 (six) hours as needed for muscle spasms. 80 tablet 0  . polyethylene glycol powder (GLYCOLAX/MIRALAX) powder Take 17 g by mouth at bedtime.     . primidone (MYSOLINE) 50 MG tablet TAKE 2 TABLETS BY  MOUTH 3 TIMES A DAY (Patient taking differently: TAKE 100 MG BY MOUTH 3 TIMES A DAY) 180 tablet 0  . propranolol (INDERAL) 60 MG tablet Take 1 tablet (60 mg total) by mouth 2 (two) times daily. 180 tablet 1  . rosuvastatin (CRESTOR) 10 MG tablet Take 10 mg by mouth every morning.     . tamsulosin (FLOMAX) 0.4 MG CAPS capsule TAKE 1 CAPSULE BY MOUTH ONCE DAILY (Patient taking differently: TAKE 0.4 MG BY MOUTH DAILY) 90 capsule 0  . temazepam (RESTORIL) 30 MG capsule Take 30 mg by mouth at bedtime.      No current facility-administered medications for this visit.    PAST MEDICAL HISTORY: Past Medical History  Diagnosis Date  . Arthritis     left knee  . Depression   . De Quervain's tenosynovitis, left 04/2012  . Tremors of nervous system     takes propranolol for tremors  . History of radiation therapy 3 years ago  . Prostate cancer, primary, with metastasis from prostate to other site     metastasis to lymph nodes  . Complication of anesthesia     pt denies ponv, pt woke up during colonscopy in past  . Diabetes mellitus     Insulin pump, takes lisinopril for kidneys, dr Buddy Duty endocrinologist    PAST SURGICAL HISTORY: Past Surgical History  Procedure Laterality Date  . Hemorrhoid surgery  1970s  . Anterior cruciate ligament repair      right knee  . Wrist arthroscopy  05/03/2010    right; and release of 1st dorsal compartment  . Knee arthrotomy  03/18/2008    left; with scar exc.  [name] Knee arthroscopy  09/25/2007; 04/15/2005    left  . Knee joint manipulation  06/29/2007    left  . Total knee arthroplasty  04/29/2007    left  . Dorsal compartment release  04/21/2012    Procedure: RELEASE DORSAL COMPARTMENT (DEQUERVAIN);  Surgeon: Cammie Sickle., MD;  Location: [facility];  Service: Orthopedics;  Laterality: Left;  left 1st dorsal compartment release left wrist   . Minor amputation of digit Left 07/21/2013    Procedure: LEFT SMALL REVISION AMPUTATION (MINOR  PROCEDURE) ;  Surgeon: Schuyler Amor, MD;  Location: [facility];  Service: Orthopedics;  Laterality: Left;  . Colonoscopy with propofol N/A 04/07/2014    Procedure: COLONOSCOPY WITH PROPOFOL;  Surgeon: Jeryl Columbia, MD;  Location: [facility];  Service: Endoscopy;  Laterality: N/A;  . Total knee revision Left 01/25/2015    Procedure: LEFT TOTAL KNEE  ARTHOPLASTY REVISION;  Surgeon: Gaynelle Arabian, MD;  Location: [facility];  Service: Orthopedics;  Laterality: Left;    FAMILY HISTORY: Family History  Problem Relation Age of Onset  . Heart Problems Father     SOCIAL HISTORY:  History   Social History  . Marital Status: Married    Spouse Name: Dale Morrow  .  Number of Children: o  . Years of Education: college   Occupational History  . retired    Social History Main Topics  . Smoking status: Former Smoker -- 15 years    Types: Cigarettes    Quit date: 10/13/1979  . Smokeless tobacco: Never Used  . Alcohol Use: No  . Drug Use: No  . Sexual Activity: Not on file   Other Topics Concern  . Not on file   Social History Narrative   Patient lives at home with his wife Dale Morrow) and there four dogs.   Retired.   Education two years of college.   Right handed.   Caffeine one cup of coffee daily.     PHYSICAL EXAM   Filed Vitals:   03/28/15 0738  BP: 139/70  Pulse: 57  Height: 5\' 6"  (1.676 m)  Weight: 188 lb (85.276 kg)    Not recorded      Body mass index is 30.36 kg/(m^2).  PHYSICAL EXAMNIATION:  Gen: NAD, conversant, well nourised, obese, well groomed                     Cardiovascular: Regular rate rhythm, no peripheral edema, warm, nontender. Eyes: Conjunctivae clear without exudates or hemorrhage Neck: Supple, no carotid bruise. Pulmonary: Clear to auscultation bilaterally   NEUROLOGICAL EXAM:  MENTAL STATUS: Speech:    Speech is normal; fluent and spontaneous with normal comprehension.  Cognition:    The patient is oriented to person,  place, and time;     recent and remote memory intact;     language fluent;     normal attention, concentration,     fund of knowledge.  CRANIAL NERVES: CN II: Visual fields are full to confrontation. Pupils are 4 mm and briskly reactive to light. Visual acuity is 20/20 bilaterally. CN III, IV, VI: extraocular movement are normal. No ptosis. CN V: Facial sensation is intact to pinprick in all 3 divisions bilaterally. Corneal responses are intact.  CN VII: Face is asymmetry, he has mild left eye-closure, cheek puff weakness. CN VIII: Hearing is normal to rubbing fingers CN IX, X: Palate elevates symmetrically. Phonation is normal. CN XI: Head turning and shoulder shrug are intact CN XII: Tongue is midline with normal movements and no atrophy.  MOTOR:  He has bilateral hands postural tremor,. Muscle bulk and tone are normal. Muscle strength is normal. STATUS POST LEFT KNEE SURGERY, MILD SWELLING AROUND LEFT KNEE INCISION.   REFLEXES: Reflexes are 2+ and symmetric at the biceps, triceps, knees, and ankles. Plantar responses are flexor.  SENSORY: Light touch, pinprick, position sense, and vibration sense are intact in fingers and toes.  COORDINATION: Rapid alternating movements and fine finger movements are intact. There is no dysmetria on finger-to-nose and heel-knee-shin. There are no abnormal or extraneous movements.   GAIT/STANCE: Antalgic dragging his left leg  Romberg is absent.   DIAGNOSTIC DATA (LABS, IMAGING, TESTING) - I reviewed patient records, labs, notes, testing and imaging myself where available.  Lab Results  Component Value Date   WBC 5.4 03/10/2015   HGB 11.6* 03/10/2015   HCT 34.0* 03/10/2015   MCV 91.4 03/10/2015   PLT 218 03/10/2015      Component Value Date/Time   NA 137 03/10/2015 1614   K 4.4 03/10/2015 1614   CL 101 03/10/2015 1614   CO2 28 01/27/2015 0514   GLUCOSE 137* 03/10/2015 1614   BUN 20 03/10/2015 1614   CREATININE 0.80 03/10/2015  1614   CALCIUM  8.0* 01/27/2015 0514   PROT 6.8 03/10/2015 1604   ALBUMIN 3.5 03/10/2015 1604   AST 30 03/10/2015 1604   ALT 19 03/10/2015 1604   ALKPHOS 66 03/10/2015 1604   BILITOT 0.3 03/10/2015 1604   GFRNONAA 89* 01/27/2015 0514   GFRAA >90 01/27/2015 0514   ASSESSMENT AND PLAN  GRYPHON VANDERVEEN is a 75 y.o. male    1, essential tremor, involving bilateral hands, head titubation, tremor in his voice, responding to propanolol, he is tolerating propanolol 60 mg twice a day, I will increase the dosage to 80 mg daily,   2, he complains of worsening depression, is taking Lexapro 20 mg daily, also chronic insomnia, will add on trazodone 50 mg every night, Clonazepam 0.5 milligram as needed, stop tempazem.  3. Left hemifacial spasm, overall much improved after his bilateral blepharoplasty, will hold of EMG guided xeomin injection at this point  4, return to clinic in 2-3 months    Marcial Pacas, M.D. Ph.D.  [facility] [address], [place], [place] [address] Ph: [phone] Fax: [phone]

## 2015-03-28 NOTE — Telephone Encounter (Signed)
Patient called stating that his pharmacy: [facility] did not receive the script for clonazePAM (KLONOPIN) 0.5 MG tablet. Patient can be reached @ [phone]

## 2015-03-28 NOTE — Telephone Encounter (Signed)
Rx was written today.  Since it is a control, it cannot be e-scribed, must be printed, signed and manually faxed.  Sent a message to clinic as well.  I called back.  Got no answer. Left message.

## 2015-03-29 ENCOUNTER — Ambulatory Visit (HOSPITAL_COMMUNITY)
Admission: RE | Admit: 2015-03-29 | Discharge: 2015-03-29 | Disposition: A | Discharge status: Home / Self Care | Payer: 59 | Source: Ambulatory Visit | Attending: Gastroenterology | Admitting: Gastroenterology

## 2015-03-29 DIAGNOSIS — R110 Nausea: Secondary | ICD-10-CM

## 2015-03-29 DIAGNOSIS — R1084 Generalized abdominal pain: Secondary | ICD-10-CM | POA: Insufficient documentation

## 2015-03-30 ENCOUNTER — Ambulatory Visit: Payer: 59 | Admitting: Physical Therapy

## 2015-03-30 ENCOUNTER — Encounter: Payer: Self-pay | Admitting: Physical Therapy

## 2015-03-30 DIAGNOSIS — R29898 Other symptoms and signs involving the musculoskeletal system: Secondary | ICD-10-CM

## 2015-03-30 DIAGNOSIS — M25662 Stiffness of left knee, not elsewhere classified: Secondary | ICD-10-CM | POA: Diagnosis not present

## 2015-03-30 DIAGNOSIS — M7989 Other specified soft tissue disorders: Secondary | ICD-10-CM

## 2015-03-30 NOTE — Therapy (Signed)
[facility] [address], [place], [place], [address] Phone: [phone]   Fax:  [phone]  Physical Therapy Treatment  Patient Details  Name: Dale Morrow MRN: [id number] Date of Birth: 1941/06/28 Referring Provider:  Gaynelle Arabian, MD  Encounter Date: 03/30/2015      PT End of Session - 03/30/15 1311    Visit Number 2   Number of Visits 10  Medicare   Date for PT Re-Evaluation 05/23/15   PT Start Time 6378   PT Stop Time 1225   PT Time Calculation (min) 1435 min   Activity Tolerance Patient tolerated treatment well   Behavior During Therapy [facility] for tasks assessed/performed      Past Medical History  Diagnosis Date  . Arthritis     left knee  . Depression   . De Quervain's tenosynovitis, left 04/2012  . Tremors of nervous system     takes propranolol for tremors  . History of radiation therapy 3 years ago  . Prostate cancer, primary, with metastasis from prostate to other site     metastasis to lymph nodes  . Complication of anesthesia     pt denies ponv, pt woke up during colonscopy in past  . Diabetes mellitus     Insulin pump, takes lisinopril for kidneys, dr Buddy Duty endocrinologist    Past Surgical History  Procedure Laterality Date  . Hemorrhoid surgery  1970s  . Anterior cruciate ligament repair      right knee  . Wrist arthroscopy  05/03/2010    right; and release of 1st dorsal compartment  . Knee arthrotomy  03/18/2008    left; with scar exc.  [name] Knee arthroscopy  09/25/2007; 04/15/2005    left  . Knee joint manipulation  06/29/2007    left  . Total knee arthroplasty  04/29/2007    left  . Dorsal compartment release  04/21/2012    Procedure: RELEASE DORSAL COMPARTMENT (DEQUERVAIN);  Surgeon: Cammie Sickle., MD;  Location: [facility];  Service: Orthopedics;  Laterality: Left;  left 1st dorsal compartment release left wrist   . Minor amputation of digit Left 07/21/2013   Procedure: LEFT SMALL REVISION AMPUTATION (MINOR PROCEDURE) ;  Surgeon: Schuyler Amor, MD;  Location: [facility];  Service: Orthopedics;  Laterality: Left;  . Colonoscopy with propofol N/A 04/07/2014    Procedure: COLONOSCOPY WITH PROPOFOL;  Surgeon: Jeryl Columbia, MD;  Location: [facility];  Service: Endoscopy;  Laterality: N/A;  . Total knee revision Left 01/25/2015    Procedure: LEFT TOTAL KNEE  ARTHOPLASTY REVISION;  Surgeon: Gaynelle Arabian, MD;  Location: [facility];  Service: Orthopedics;  Laterality: Left;    There were no vitals filed for this visit.  Visit Diagnosis:  Knee stiffness, left  Weakness of left lower extremity  Swelling of limb                       [facility] Adult PT Treatment/Exercise - 03/30/15 0001    Lumbar Exercises: Machines for Strengthening   Leg Press 2 legs, St # 6 , 60 # 15x2   Knee/Hip Exercises: Aerobic   Stationary Bike level 0 x 5 min   Knee/Hip Exercises: Standing   Terminal Knee Extension Strengthening;20 reps;Theraband   Theraband Level (Terminal Knee Extension) Level 3 (Green)   Terminal Knee Extension Limitations needed VC for full knee extension   Rebounder 3 ways 1 min each with c.g due to  losing balance   Vasopneumatic   Number Minutes Vasopneumatic  15 minutes   Vasopnuematic Location  Knee   Vasopneumatic Pressure Medium   Vasopneumatic Temperature  3 snowflakes   Manual Therapy   Manual Therapy Joint mobilization;Soft tissue mobilization;Passive ROM   Joint Mobilization anterior and posterior glide, left patella mobilization   Soft tissue mobilization TFM to healed scar, to left quads, posterior left knee, left knee   Passive ROM to left knee for flexion                  PT Short Term Goals - 03/28/15 1541    PT SHORT TERM GOAL #1   Title Independent with HEP    Time 4   Period Weeks   Status New   PT SHORT TERM GOAL #2   Title Improve Lt knee flexion AROM to 95 degrees to improve  ability to do elliptical    Time 4   Period Weeks   Status New   PT SHORT TERM GOAL #3   Title Improve Lt knee extension AROM to -5 for fluid gait pattern    Time 4   Period Weeks   Status New           PT Long Term Goals - 03/28/15 1548    PT LONG TERM GOAL #1   Title Be independent with advanced HEP    Time 8   Period Weeks   Status New   PT LONG TERM GOAL #2   Title Improve FOTO disabiltiy to < or = to 43%   Time 8   Status New   PT LONG TERM GOAL #3   Title Increase LE strength to return to 1 hour cardio workout on elliptical    Time 8   Period Weeks   Status New   PT LONG TERM GOAL #4   Title Improve knee flexion AROM to 105 in order to use riding lawnmower    Time 8   Period Weeks   Status New   PT LONG TERM GOAL #5   Title wean from the cane to no device for community distances > or = to 75% of the time   Time 8   Period Weeks   Status New               Plan - 03/30/15 1250    Clinical Impression Statement Patient is having his first treatment after the initial evaluation.  Patient needed help to keep his left foot on pedal of the bike.  Patient needed to have someone with hip on the trampoine to do weightshifting without holding on to avoid a fall. Patient feels good after therapy. Patient     Pt will benefit from skilled therapeutic intervention in order to improve on the following deficits Decreased range of motion;Difficulty walking;Decreased endurance;Decreased activity tolerance;Pain;Decreased scar mobility;Increased edema;Decreased strength   Rehab Potential Good   PT Frequency 3x / week   PT Duration 8 weeks   PT Treatment/Interventions Moist Heat;Traction;Ultrasound;Vasopneumatic Device;Cryotherapy;Electrical Stimulation;Gait training;Functional mobility training;Therapeutic exercise;Neuromuscular re-education;Patient/family education;Passive range of motion;Energy conservation   PT Next Visit Plan left knee strengthening exercise   PT Home  Exercise Plan current HEP   Consulted and Agree with Plan of Care Patient        Problem List Patient Active Problem List   Diagnosis Date Noted  . Failed total knee arthroplasty 01/25/2015  . OA (osteoarthritis) of knee 01/25/2015  . Essential tremor 03/09/2014  . Facial spasm 04/14/2013  .  Arthritis   . Depression   . Hypertension   . Prostate cancer, primary, with metastasis from prostate to other site   . PONV (postoperative nausea and vomiting)   . De Quervain's tenosynovitis, left 04/13/2012    GRAY,CHERYL,PT 03/30/2015, 1:18 PM  [facility] [address] [place], [place], [address] Phone: [phone]   Fax:  [phone]

## 2015-03-31 ENCOUNTER — Ambulatory Visit: Payer: 59 | Admitting: Physical Therapy

## 2015-03-31 ENCOUNTER — Encounter: Payer: Self-pay | Admitting: Physical Therapy

## 2015-03-31 DIAGNOSIS — M25662 Stiffness of left knee, not elsewhere classified: Secondary | ICD-10-CM | POA: Diagnosis not present

## 2015-03-31 DIAGNOSIS — R29898 Other symptoms and signs involving the musculoskeletal system: Secondary | ICD-10-CM

## 2015-03-31 DIAGNOSIS — M7989 Other specified soft tissue disorders: Secondary | ICD-10-CM

## 2015-03-31 NOTE — Therapy (Signed)
[facility] [address], [place], [place], [address] Phone: [phone]   Fax:  [phone]  Physical Therapy Treatment  Patient Details  Name: Dale Morrow MRN: [id number] Date of Birth: [birth year] Referring Provider:  Gaynelle Arabian, MD  Encounter Date: 03/31/2015      PT End of Session - 03/31/15 1003    Visit Number 3   Number of Visits 10   Date for PT Re-Evaluation 05/23/15   PT Start Time 0927   PT Stop Time 1030   PT Time Calculation (min) 63 min   Activity Tolerance Patient tolerated treatment well   Behavior During Therapy [facility] for tasks assessed/performed      Past Medical History  Diagnosis Date  . Arthritis     left knee  . Depression   . De Quervain's tenosynovitis, left 04/2012  . Tremors of nervous system     takes propranolol for tremors  . History of radiation therapy 3 years ago  . Prostate cancer, primary, with metastasis from prostate to other site     metastasis to lymph nodes  . Complication of anesthesia     pt denies ponv, pt woke up during colonscopy in past  . Diabetes mellitus     Insulin pump, takes lisinopril for kidneys, dr Buddy Duty endocrinologist    Past Surgical History  Procedure Laterality Date  . Hemorrhoid surgery  1970s  . Anterior cruciate ligament repair      right knee  . Wrist arthroscopy  05/03/2010    right; and release of 1st dorsal compartment  . Knee arthrotomy  03/18/2008    left; with scar exc.  [name] Knee arthroscopy  09/25/2007; 04/15/2005    left  . Knee joint manipulation  06/29/2007    left  . Total knee arthroplasty  04/29/2007    left  . Dorsal compartment release  04/21/2012    Procedure: RELEASE DORSAL COMPARTMENT (DEQUERVAIN);  Surgeon: Cammie Sickle., MD;  Location: [facility];  Service: Orthopedics;  Laterality: Left;  left 1st dorsal compartment release left wrist   . Minor amputation of digit Left 07/21/2013    Procedure: LEFT  SMALL REVISION AMPUTATION (MINOR PROCEDURE) ;  Surgeon: Schuyler Amor, MD;  Location: [place];  Service: Orthopedics;  Laterality: Left;  . Colonoscopy with propofol N/A 04/07/2014    Procedure: COLONOSCOPY WITH PROPOFOL;  Surgeon: Jeryl Columbia, MD;  Location: [facility];  Service: Endoscopy;  Laterality: N/A;  . Total knee revision Left 01/25/2015    Procedure: LEFT TOTAL KNEE  ARTHOPLASTY REVISION;  Surgeon: Gaynelle Arabian, MD;  Location: [facility];  Service: Orthopedics;  Laterality: Left;    There were no vitals filed for this visit.  Visit Diagnosis:  Knee stiffness, left  Weakness of left lower extremity  Swelling of limb      Subjective Assessment - 03/31/15 0932    Subjective I'm so happy to be here. reportes knee is generally feeling better.   Currently in Pain? Yes   Pain Score 4    Pain Location Knee   Pain Orientation Left   Pain Descriptors / Indicators Constant   Pain Type Acute pain;Surgical pain   Multiple Pain Sites No            [facility] PT Assessment - 03/31/15 0001    AROM   Overall AROM Comments 88 degres supine  [place] Adult PT Treatment/Exercise - 03/31/15 0001    Lumbar Exercises: Machines for Strengthening   Leg Press St #6 Bil 60# x12, 65# x 10   Knee/Hip Exercises: Stretches   Knee: Self-Stretch to increase Flexion --  Rocking 10-20x, then static holds 10 sec 10x under pt contro   Knee/Hip Exercises: Aerobic   Stationary Bike Rocking x 6 min then 1 min of full revolution lifting hip up a little but no RT lean like her started  initialy.    Knee/Hip Exercises: Standing   Rebounder 3 way shift 1 min each   Vasopneumatic   Number Minutes Vasopneumatic  15 minutes   Vasopnuematic Location  Knee   Vasopneumatic Pressure Medium   Vasopneumatic Temperature  3 snowflakes   Manual Therapy   Soft tissue mobilization TFM to healed scar, to left quads, posterior left knee, left knee   Passive ROM to  left knee for flexion                  PT Short Term Goals - 03/28/15 1541    PT SHORT TERM GOAL #1   Title Independent with HEP    Time 4   Period Weeks   Status New   PT SHORT TERM GOAL #2   Title Improve Lt knee flexion AROM to 95 degrees to improve ability to do elliptical    Time 4   Period Weeks   Status New   PT SHORT TERM GOAL #3   Title Improve Lt knee extension AROM to -5 for fluid gait pattern    Time 4   Period Weeks   Status New           PT Long Term Goals - 03/28/15 1548    PT LONG TERM GOAL #1   Title Be independent with advanced HEP    Time 8   Period Weeks   Status New   PT LONG TERM GOAL #2   Title Improve FOTO disabiltiy to < or = to 43%   Time 8   Status New   PT LONG TERM GOAL #3   Title Increase LE strength to return to 1 hour cardio workout on elliptical    Time 8   Period Weeks   Status New   PT LONG TERM GOAL #4   Title Improve knee flexion AROM to 105 in order to use riding lawnmower    Time 8   Period Weeks   Status New   PT LONG TERM GOAL #5   Title wean from the cane to no device for community distances > or = to 75% of the time   Time 8   Period Weeks   Status New               Plan - 03/31/15 1003    Clinical Impression Statement working towards increasing pt's flexion. Pt did better bending knee under his own volition. See ROM measurement in chart.    Pt will benefit from skilled therapeutic intervention in order to improve on the following deficits Decreased range of motion;Difficulty walking;Decreased endurance;Decreased activity tolerance;Pain;Decreased scar mobility;Increased edema;Decreased strength   Rehab Potential Good   PT Frequency 3x / week   PT Duration 8 weeks   PT Treatment/Interventions Moist Heat;Traction;Ultrasound;Vasopneumatic Device;Cryotherapy;Electrical Stimulation;Gait training;Functional mobility training;Therapeutic exercise;Neuromuscular re-education;Patient/family education;Passive  range of motion;Energy conservation   PT Next Visit Plan Soft tissue mobs, knee ROm for flexion, machines for strength   Consulted and Agree with Plan of Care  Patient        Problem List Patient Active Problem List   Diagnosis Date Noted  . Failed total knee arthroplasty 01/25/2015  . OA (osteoarthritis) of knee 01/25/2015  . Essential tremor 03/09/2014  . Facial spasm 04/14/2013  . Arthritis   . Depression   . Hypertension   . Prostate cancer, primary, with metastasis from prostate to other site   . PONV (postoperative nausea and vomiting)   . De Quervain's tenosynovitis, left 04/13/2012    Ronny Korff, PTA 03/31/2015, 10:12 AM  [facility] [address], [place], [place], [address] Phone: [phone]   Fax:  [phone]

## 2015-04-01 ENCOUNTER — Encounter: Payer: Self-pay | Admitting: Neurology

## 2015-04-03 ENCOUNTER — Ambulatory Visit: Payer: 59 | Admitting: Physical Therapy

## 2015-04-03 ENCOUNTER — Telehealth: Payer: Self-pay | Admitting: *Deleted

## 2015-04-03 ENCOUNTER — Encounter: Payer: Self-pay | Admitting: *Deleted

## 2015-04-03 ENCOUNTER — Encounter: Payer: Self-pay | Admitting: Physical Therapy

## 2015-04-03 DIAGNOSIS — M25662 Stiffness of left knee, not elsewhere classified: Secondary | ICD-10-CM | POA: Diagnosis not present

## 2015-04-03 DIAGNOSIS — M7989 Other specified soft tissue disorders: Secondary | ICD-10-CM

## 2015-04-03 DIAGNOSIS — R29898 Other symptoms and signs involving the musculoskeletal system: Secondary | ICD-10-CM

## 2015-04-03 NOTE — Telephone Encounter (Signed)
Chart reviewed, he was started on clonazepam 0.5 milligram every night, trazodone 50 mg every night for insomnia in last visit in March 28 2015  Please call patient, he may increase clonazepam to 0.5 milligrams to 2 tablet every night, keep trazodone 50 mg every night if he continues to have trouble, may call back to office,

## 2015-04-03 NOTE — Telephone Encounter (Signed)
Per patient email, the trazadone prescribed at his last appt has not been helpful.  Sent to Dr. Krista Blue for review and will email patient back with her response.

## 2015-04-03 NOTE — Therapy (Signed)
[facility] [address] [place], [place], [address] Phone: [phone]   Fax:  [phone]  Physical Therapy Treatment  Patient Details  Name: Dale Morrow MRN: [id number] Date of Birth: 1941-02-16 Referring Provider:  Maurice Small, MD  Encounter Date: 04/03/2015      PT End of Session - 04/03/15 1534    Visit Number 4   Number of Visits 10   Date for PT Re-Evaluation 05/23/15   PT Start Time 2947   PT Stop Time 1542   PT Time Calculation (min) 64 min   Activity Tolerance Patient tolerated treatment well   Behavior During Therapy [facility] for tasks assessed/performed      Past Medical History  Diagnosis Date  . Arthritis     left knee  . Depression   . De Quervain's tenosynovitis, left 04/2012  . Tremors of nervous system     takes propranolol for tremors  . History of radiation therapy 3 years ago  . Prostate cancer, primary, with metastasis from prostate to other site     metastasis to lymph nodes  . Complication of anesthesia     pt denies ponv, pt woke up during colonscopy in past  . Diabetes mellitus     Insulin pump, takes lisinopril for kidneys, dr Buddy Duty endocrinologist    Past Surgical History  Procedure Laterality Date  . Hemorrhoid surgery  1970s  . Anterior cruciate ligament repair      right knee  . Wrist arthroscopy  05/03/2010    right; and release of 1st dorsal compartment  . Knee arthrotomy  03/18/2008    left; with scar exc.  [name] Knee arthroscopy  09/25/2007; 04/15/2005    left  . Knee joint manipulation  06/29/2007    left  . Total knee arthroplasty  04/29/2007    left  . Dorsal compartment release  04/21/2012    Procedure: RELEASE DORSAL COMPARTMENT (DEQUERVAIN);  Surgeon: Cammie Sickle., MD;  Location: [facility];  Service: Orthopedics;  Laterality: Left;  left 1st dorsal compartment release left wrist   . Minor amputation of digit Left 07/21/2013    Procedure: LEFT  SMALL REVISION AMPUTATION (MINOR PROCEDURE) ;  Surgeon: Schuyler Amor, MD;  Location: [place];  Service: Orthopedics;  Laterality: Left;  . Colonoscopy with propofol N/A 04/07/2014    Procedure: COLONOSCOPY WITH PROPOFOL;  Surgeon: Jeryl Columbia, MD;  Location: [facility];  Service: Endoscopy;  Laterality: N/A;  . Total knee revision Left 01/25/2015    Procedure: LEFT TOTAL KNEE  ARTHOPLASTY REVISION;  Surgeon: Gaynelle Arabian, MD;  Location: [facility];  Service: Orthopedics;  Laterality: Left;    There were no vitals filed for this visit.  Visit Diagnosis:  Knee stiffness, left  Weakness of left lower extremity  Swelling of limb      Subjective Assessment - 04/03/15 1449    Subjective I'm so happy to be here. reportes knee is generally feeling better. Reports stiffness and tightness    Currently in Pain? Yes   Pain Score 5   pt was moving the lawn in sitting lawn moverting   Pain Location Knee   Pain Orientation Left   Pain Descriptors / Indicators Constant   Pain Type Acute pain;Surgical pain   Pain Onset More than a month ago   Pain Frequency Intermittent   Multiple Pain Sites No  [place] Adult PT Treatment/Exercise - 04/03/15 0001    Lumbar Exercises: Machines for Strengthening   Leg Press St #6 65# B LE 3x10 L LE 30# 3 x 10  with focus on extension   Knee/Hip Exercises: Stretches   Knee: Self-Stretch to increase Flexion --  Stairs quadriceps & Hamstrings stretch x3 20sec,& rocking fo   Knee/Hip Exercises: Aerobic   Stationary Bike Rocking initially x2 min, followed by6 min of full revolution lifting hip up a little but no RT lean like her started  initialy.    Knee/Hip Exercises: Standing   Rebounder 3 way shift 1 min each   Modalities   Modalities Vasopneumatic   Vasopneumatic   Number Minutes Vasopneumatic  15 minutes   Vasopnuematic Location  Knee   Vasopneumatic Pressure Medium   Vasopneumatic Temperature   3 snowflakes   Manual Therapy   Manual Therapy Soft tissue mobilization  along incision, around patella, quadriceps & hamstrings   Passive ROM to left knee for flexion                  PT Short Term Goals - 04/03/15 1538    PT SHORT TERM GOAL #1   Title Independent with HEP    Time 4   Period Weeks   Status On-going   PT SHORT TERM GOAL #2   Title Improve Lt knee flexion AROM to 95 degrees to improve ability to do elliptical    Time 4   Period Weeks   Status On-going   PT SHORT TERM GOAL #3   Title Improve Lt knee extension AROM to -5 for fluid gait pattern    Time 4   Period Weeks   Status On-going           PT Long Term Goals - 04/03/15 1538    PT LONG TERM GOAL #1   Title Be independent with advanced HEP    Time 8   Period Weeks   Status On-going   PT LONG TERM GOAL #2   Title Improve FOTO disabiltiy to < or = to 43%   Time 8   Period Weeks   Status On-going   PT LONG TERM GOAL #3   Title Increase LE strength to return to 1 hour cardio workout on elliptical    Time 8   Period Weeks   Status On-going   PT LONG TERM GOAL #4   Title Improve knee flexion AROM to 105 in order to use riding lawnmower    Time 8   Period Weeks   Status On-going   PT LONG TERM GOAL #5   Title wean from the cane to no device for community distances > or = to 75% of the time   Time 8   Period Weeks   Status On-going               Plan - 04/03/15 1535    Clinical Impression Statement Pt able for full revolution after short period of rocking, His softtissue surrounding Lt knee is very tight limiting ROM, but patient is progressing toward all  LTG    Pt will benefit from skilled therapeutic intervention in order to improve on the following deficits Decreased range of motion;Difficulty walking;Decreased endurance;Decreased activity tolerance;Pain;Decreased scar mobility;Increased edema;Decreased strength   Rehab Potential Good   PT Frequency 3x / week   PT Duration  8 weeks   PT Treatment/Interventions Moist Heat;Traction;Ultrasound;Vasopneumatic Device;Cryotherapy;Electrical Stimulation;Gait training;Functional mobility training;Therapeutic exercise;Neuromuscular re-education;Patient/family education;Passive range of motion;Energy conservation  PT Next Visit Plan Soft tissue mobs, knee ROm for flexion, machines for strength   PT Home Exercise Plan current HEP   Consulted and Agree with Plan of Care Patient        Problem List Patient Active Problem List   Diagnosis Date Noted  . Failed total knee arthroplasty 01/25/2015  . OA (osteoarthritis) of knee 01/25/2015  . Essential tremor 03/09/2014  . Facial spasm 04/14/2013  . Arthritis   . Depression   . Hypertension   . Prostate cancer, primary, with metastasis from prostate to other site   . PONV (postoperative nausea and vomiting)   . De Quervain's tenosynovitis, left 04/13/2012    NAUMANN-HOUEGNIFIO,Caral Whan PTA 04/03/2015, 3:45 PM  [facility] [address] [place], [place], [address] Phone: [phone]   Fax:  [phone]

## 2015-04-03 NOTE — Telephone Encounter (Signed)
Patient called and stated that he was returning the nurses call. Please call and advise.

## 2015-04-03 NOTE — Telephone Encounter (Signed)
Waiting for Dr. Krista Blue to review chart and will respond to patient.

## 2015-04-03 NOTE — Telephone Encounter (Signed)
He is agreeable to the plan below and verbalized understanding.  He will call back if this is not helpful.

## 2015-04-04 ENCOUNTER — Ambulatory Visit: Payer: 59

## 2015-04-05 ENCOUNTER — Ambulatory Visit: Payer: 59

## 2015-04-05 DIAGNOSIS — M25662 Stiffness of left knee, not elsewhere classified: Secondary | ICD-10-CM

## 2015-04-05 DIAGNOSIS — M7989 Other specified soft tissue disorders: Secondary | ICD-10-CM

## 2015-04-05 DIAGNOSIS — R29898 Other symptoms and signs involving the musculoskeletal system: Secondary | ICD-10-CM

## 2015-04-05 NOTE — Telephone Encounter (Signed)
I have talked with Mr. Folks, he continue have insomnia, I have suggested him keep trazodone 50 mg 1 tablet every night, clonazepam 0.5 milligram every night as needed, keep good sleep hygiene

## 2015-04-05 NOTE — Therapy (Signed)
[facility] [address], [place], [place], [address] Phone: [phone]   Fax:  [phone]  Physical Therapy Treatment  Patient Details  Name: Dale Morrow MRN: [id number] Date of Birth: 09-15-1941 Referring Provider:  Gaynelle Arabian, MD  Encounter Date: 04/05/2015      PT End of Session - 04/05/15 0843    Visit Number 5   Number of Visits 10  Medicare   Date for PT Re-Evaluation 05/23/15   PT Start Time 0751   PT Stop Time 0855   PT Time Calculation (min) 64 min   Activity Tolerance Patient tolerated treatment well   Behavior During Therapy [facility] for tasks assessed/performed      Past Medical History  Diagnosis Date  . Arthritis     left knee  . Depression   . De Quervain's tenosynovitis, left 04/2012  . Tremors of nervous system     takes propranolol for tremors  . History of radiation therapy 3 years ago  . Prostate cancer, primary, with metastasis from prostate to other site     metastasis to lymph nodes  . Complication of anesthesia     pt denies ponv, pt woke up during colonscopy in past  . Diabetes mellitus     Insulin pump, takes lisinopril for kidneys, dr Buddy Duty endocrinologist    Past Surgical History  Procedure Laterality Date  . Hemorrhoid surgery  1970s  . Anterior cruciate ligament repair      right knee  . Wrist arthroscopy  05/03/2010    right; and release of 1st dorsal compartment  . Knee arthrotomy  03/18/2008    left; with scar exc.  [name] Knee arthroscopy  09/25/2007; 04/15/2005    left  . Knee joint manipulation  06/29/2007    left  . Total knee arthroplasty  04/29/2007    left  . Dorsal compartment release  04/21/2012    Procedure: RELEASE DORSAL COMPARTMENT (DEQUERVAIN);  Surgeon: Cammie Sickle., MD;  Location: [facility];  Service: Orthopedics;  Laterality: Left;  left 1st dorsal compartment release left wrist   . Minor amputation of digit Left 07/21/2013   Procedure: LEFT SMALL REVISION AMPUTATION (MINOR PROCEDURE) ;  Surgeon: Schuyler Amor, MD;  Location: [place];  Service: Orthopedics;  Laterality: Left;  . Colonoscopy with propofol N/A 04/07/2014    Procedure: COLONOSCOPY WITH PROPOFOL;  Surgeon: Jeryl Columbia, MD;  Location: [facility];  Service: Endoscopy;  Laterality: N/A;  . Total knee revision Left 01/25/2015    Procedure: LEFT TOTAL KNEE  ARTHOPLASTY REVISION;  Surgeon: Gaynelle Arabian, MD;  [facility]: [facility];  Service: Orthopedics;  Laterality: Left;    There were no vitals filed for this visit.  Visit Diagnosis:  Knee stiffness, left  Weakness of left lower extremity  Swelling of limb      Subjective Assessment - 04/05/15 0752    Subjective I feel like I am making progress.  Had to go down steps in the middle of the night last night and it was challenging due to stiffness.     Currently in Pain? Yes   Pain Score 4    Pain Location Knee   Pain Orientation Left   Pain Descriptors / Indicators Constant   Pain Type Surgical pain            [facility] PT Assessment - 04/05/15 0001    AROM   Overall AROM  Deficits   Overall AROM  Comments 90 degrees                     [facility] Adult PT Treatment/Exercise - 04/05/15 0001    Lumbar Exercises: Machines for Strengthening   Leg Press St #6 70# Bil LE 3x10 Lt LE 35# 3 x 10  with focus on extension.  Pt tolerated increased weight well   Knee/Hip Exercises: Aerobic   Stationary Bike rocking initially then full revolutions x 10 minutes   Elliptical Ramp 5, Level 2x 5 minutes   Knee/Hip Exercises: Standing   Rebounder 3 way shift 1 min each   Walking with Sports Cord 25# forward and reverse:    Vasopneumatic   Number Minutes Vasopneumatic  15 minutes   Vasopnuematic Location  Knee   Vasopneumatic Pressure Medium   Vasopneumatic Temperature  3 snowflakes   Manual Therapy   Manual Therapy Soft tissue mobilization  along incision, around patella,  quadriceps & hamstrings   Passive ROM to left knee for flexion                  PT Short Term Goals - 04/03/15 1538    PT SHORT TERM GOAL #1   Title Independent with HEP    Time 4   Period Weeks   Status On-going   PT SHORT TERM GOAL #2   Title Improve Lt knee flexion AROM to 95 degrees to improve ability to do elliptical    Time 4   Period Weeks   Status On-going   PT SHORT TERM GOAL #3   Title Improve Lt knee extension AROM to -5 for fluid gait pattern    Time 4   Period Weeks   Status On-going           PT Long Term Goals - 04/03/15 1538    PT LONG TERM GOAL #1   Title Be independent with advanced HEP    Time 8   Period Weeks   Status On-going   PT LONG TERM GOAL #2   Title Improve FOTO disabiltiy to < or = to 43%   Time 8   Period Weeks   Status On-going   PT LONG TERM GOAL #3   Title Increase LE strength to return to 1 hour cardio workout on elliptical    Time 8   Period Weeks   Status On-going   PT LONG TERM GOAL #4   Title Improve knee flexion AROM to 105 in order to use riding lawnmower    Time 8   Period Weeks   Status On-going   PT LONG TERM GOAL #5   Title wean from the cane to no device for community distances > or = to 75% of the time   Time 8   Period Weeks   Status On-going               Plan - 04/05/15 0809    Clinical Impression Statement Pt able to tolerate increased weight on the leg press today.  Pt with good techinique with resisted walking.  Pt with limited Lt knee AROM and edema.  Pt will benefit from skilled PT to reduce edema, improve gait, endurance and ROM of Lt knee.    Pt will benefit from skilled therapeutic intervention in order to improve on the following deficits Decreased range of motion;Difficulty walking;Decreased endurance;Decreased activity tolerance;Pain;Decreased scar mobility;Increased edema;Decreased strength   Rehab Potential Good   PT Frequency 3x / week   PT Duration 8  weeks   PT  Treatment/Interventions Moist Heat;Ultrasound;Vasopneumatic Device;Cryotherapy;Electrical Stimulation;Gait training;Functional mobility training;Therapeutic exercise;Neuromuscular re-education;Patient/family education;Passive range of motion;Energy conservation;Visual/perceptual remediation/compensation   PT Next Visit Plan Soft tissue mobs, knee ROm for flexion, machines for strength   Consulted and Agree with Plan of Care Patient        Problem List Patient Active Problem List   Diagnosis Date Noted  . Failed total knee arthroplasty 01/25/2015  . OA (osteoarthritis) of knee 01/25/2015  . Essential tremor 03/09/2014  . Facial spasm 04/14/2013  . Arthritis   . Depression   . Hypertension   . Prostate cancer, primary, with metastasis from prostate to other site   . PONV (postoperative nausea and vomiting)   . De Quervain's tenosynovitis, left 04/13/2012    TAKACS,KELLY, PT 04/05/2015, 8:44 AM  [facility] [address], [place], [place], [address] Phone: [phone]   Fax:  [phone]

## 2015-04-05 NOTE — Telephone Encounter (Addendum)
Patient called stating he is calling to give update on how the medications are working.  The 1st night did not work at all, the 2nd night worked for about 3 hrs then he awoke and it took a long time to fall back asleep. He did not take anything else to go back to sleep. Please call and advise. Patient can be reached at [phone].

## 2015-04-05 NOTE — Telephone Encounter (Signed)
Spoke to Dale Morrow - he has tried Clonazepam 0.5mg , two tablets and Trazadone 50mg , one tablet together for the last two nights.  The most uninterrupted sleep he was able to get was 3 hours then he woke up and could not go back to sleep for another 1.5 hours.  He is requesting to try another medication.  He has tried and failed Restoril in the past.

## 2015-04-06 NOTE — Telephone Encounter (Signed)
Patient called and wanted to leave a message for [facility] regarding his sleep last night. He states that he slept 3 hours and 52 minutes, and his fitbit says that he was awake for 2 hours and restless for 6 hours. Patient has requested that the nurse return his call to ([phone]). Please call and advise.

## 2015-04-06 NOTE — Telephone Encounter (Signed)
He is inquiring about an alternate treatment plan.

## 2015-04-07 ENCOUNTER — Encounter: Payer: Self-pay | Admitting: Physical Therapy

## 2015-04-07 ENCOUNTER — Ambulatory Visit: Payer: 59 | Admitting: Physical Therapy

## 2015-04-07 DIAGNOSIS — R29898 Other symptoms and signs involving the musculoskeletal system: Secondary | ICD-10-CM

## 2015-04-07 DIAGNOSIS — M7989 Other specified soft tissue disorders: Secondary | ICD-10-CM

## 2015-04-07 DIAGNOSIS — M25662 Stiffness of left knee, not elsewhere classified: Secondary | ICD-10-CM

## 2015-04-07 NOTE — Therapy (Signed)
[facility] [address], [place], [place], [address] Phone: [phone]   Fax:  [phone]  Physical Therapy Treatment  Patient Details  Name: Dale Morrow MRN: [id number] Date of Birth: Dec 14, 1940 Referring Provider:  Maurice Small, MD  Encounter Date: 04/07/2015      PT End of Session - 04/07/15 0814    Visit Number 6   Number of Visits 10   Date for PT Re-Evaluation 05/23/15   PT Start Time (p) 0750   PT Stop Time (p) 0859   PT Time Calculation (min) (p) 69 min      Past Medical History  Diagnosis Date  . Arthritis     left knee  . Depression   . De Quervain's tenosynovitis, left 04/2012  . Tremors of nervous system     takes propranolol for tremors  . History of radiation therapy 3 years ago  . Prostate cancer, primary, with metastasis from prostate to other site     metastasis to lymph nodes  . Complication of anesthesia     pt denies ponv, pt woke up during colonscopy in past  . Diabetes mellitus     Insulin pump, takes lisinopril for kidneys, dr Buddy Duty endocrinologist    Past Surgical History  Procedure Laterality Date  . Hemorrhoid surgery  1970s  . Anterior cruciate ligament repair      right knee  . Wrist arthroscopy  05/03/2010    right; and release of 1st dorsal compartment  . Knee arthrotomy  03/18/2008    left; with scar exc.  [name] Knee arthroscopy  09/25/2007; 04/15/2005    left  . Knee joint manipulation  06/29/2007    left  . Total knee arthroplasty  04/29/2007    left  . Dorsal compartment release  04/21/2012    Procedure: RELEASE DORSAL COMPARTMENT (DEQUERVAIN);  Surgeon: Cammie Sickle., MD;  Location: [facility];  Service: Orthopedics;  Laterality: Left;  left 1st dorsal compartment release left wrist   . Minor amputation of digit Left 07/21/2013    Procedure: LEFT SMALL REVISION AMPUTATION (MINOR PROCEDURE) ;  Surgeon: Schuyler Amor, MD;  Location: [place];  Service: Orthopedics;  Laterality: Left;  . Colonoscopy with propofol N/A 04/07/2014    Procedure: COLONOSCOPY WITH PROPOFOL;  Surgeon: Jeryl Columbia, MD;  Location: [facility];  Service: Endoscopy;  Laterality: N/A;  . Total knee revision Left 01/25/2015    Procedure: LEFT TOTAL KNEE  ARTHOPLASTY REVISION;  Surgeon: Gaynelle Arabian, MD;  Location: [facility];  Service: Orthopedics;  Laterality: Left;    There were no vitals filed for this visit.  Visit Diagnosis:  Knee stiffness, left  Weakness of left lower extremity  Swelling of limb      Subjective Assessment - 04/07/15 0802    Subjective Constant pain sometimes very sharp at patellar insertion   Currently in Pain? Yes   Pain Score 4    Pain Location Knee   Pain Orientation Left   Pain Descriptors / Indicators Constant   Pain Type Surgical pain   Pain Onset More than a month ago   Pain Frequency Constant                         [facility] Adult PT Treatment/Exercise - 04/07/15 0001    Lumbar Exercises: Machines for Strengthening   Leg Press St #6 70# Bil LE 3x10 Lt LE 35# 3  x 10   Knee/Hip Exercises: Aerobic   Stationary Bike rocking initially then full revolutions x 10 minutes   Elliptical Ramp 5, Level 2x 5 minutes   Knee/Hip Exercises: Standing   Rebounder 3 way shift 1 min each   Modalities   Modalities Vasopneumatic   Vasopneumatic   Number Minutes Vasopneumatic  15 minutes   Vasopnuematic Location  Knee   Vasopneumatic Pressure Medium   Vasopneumatic Temperature  3 snowflakes   Manual Therapy   Manual Therapy Soft tissue mobilization  along incision, surrounding patella, hamstring and quadricep   Passive ROM to left knee for flexion                  PT Short Term Goals - 04/03/15 1538    PT SHORT TERM GOAL #1   Title Independent with HEP    Time 4   Period Weeks   Status On-going   PT SHORT TERM GOAL #2   Title Improve Lt knee flexion AROM to 95 degrees to improve  ability to do elliptical    Time 4   Period Weeks   Status On-going   PT SHORT TERM GOAL #3   Title Improve Lt knee extension AROM to -5 for fluid gait pattern    Time 4   Period Weeks   Status On-going           PT Long Term Goals - 04/03/15 1538    PT LONG TERM GOAL #1   Title Be independent with advanced HEP    Time 8   Period Weeks   Status On-going   PT LONG TERM GOAL #2   Title Improve FOTO disabiltiy to < or = to 43%   Time 8   Period Weeks   Status On-going   PT LONG TERM GOAL #3   Title Increase LE strength to return to 1 hour cardio workout on elliptical    Time 8   Period Weeks   Status On-going   PT LONG TERM GOAL #4   Title Improve knee flexion AROM to 105 in order to use riding lawnmower    Time 8   Period Weeks   Status On-going   PT LONG TERM GOAL #5   Title wean from the cane to no device for community distances > or = to 75% of the time   Time 8   Period Weeks   Status On-going               Plan - 04/07/15 6195    Clinical Impression Statement Pt with limited ROM due to soft tissue restrictions around patella and tighness in quadriceps, hamstrings, gastrocnemius and tibialis ant.Pt will benefit from skilled PT to reduce edema, incr ROM Lt knee, improve gait and endurance.    Pt will benefit from skilled therapeutic intervention in order to improve on the following deficits Decreased range of motion;Difficulty walking;Decreased endurance;Decreased activity tolerance;Pain;Decreased scar mobility;Increased edema;Decreased strength   Rehab Potential Good   PT Frequency 3x / week   PT Duration 8 weeks   PT Treatment/Interventions Moist Heat;Ultrasound;Vasopneumatic Device;Cryotherapy;Electrical Stimulation;Gait training;Functional mobility training;Therapeutic exercise;Neuromuscular re-education;Patient/family education;Passive range of motion;Energy conservation;Visual/perceptual remediation/compensation   PT Next Visit Plan Writte MD progress  note, take ROM and strength   Consulted and Agree with Plan of Care Patient        Problem List Patient Active Problem List   Diagnosis Date Noted  . Failed total knee arthroplasty 01/25/2015  . OA (osteoarthritis) of knee 01/25/2015  .  Essential tremor 03/09/2014  . Facial spasm 04/14/2013  . Arthritis   . Depression   . Hypertension   . Prostate cancer, primary, with metastasis from prostate to other site   . PONV (postoperative nausea and vomiting)   . De Quervain's tenosynovitis, left 04/13/2012    NAUMANN-HOUEGNIFIO,Shanessa Hodak PTA 04/07/2015, 8:58 AM  [facility] [address], [place], [place], [address] Phone: [phone]   Fax:  [phone]

## 2015-04-10 ENCOUNTER — Ambulatory Visit: Payer: 59 | Admitting: Physical Therapy

## 2015-04-10 ENCOUNTER — Other Ambulatory Visit: Payer: Self-pay

## 2015-04-10 ENCOUNTER — Encounter: Payer: Self-pay | Admitting: Physical Therapy

## 2015-04-10 DIAGNOSIS — M7989 Other specified soft tissue disorders: Secondary | ICD-10-CM

## 2015-04-10 DIAGNOSIS — M25662 Stiffness of left knee, not elsewhere classified: Secondary | ICD-10-CM

## 2015-04-10 DIAGNOSIS — R29898 Other symptoms and signs involving the musculoskeletal system: Secondary | ICD-10-CM

## 2015-04-10 NOTE — Therapy (Signed)
[facility] [address] [place], [place], [address] Phone: [phone]   Fax:  [phone]  Physical Therapy Treatment  Patient Details  Name: Dale Morrow MRN: [id number] Date of Birth: [birth year] Referring Provider:  Maurice Small, MD  Encounter Date: 04/10/2015      PT End of Session - 04/10/15 1520    Visit Number 7   Number of Visits 10   Date for PT Re-Evaluation 05/23/15   PT Start Time 1430   PT Stop Time 1535   PT Time Calculation (min) 65 min   Activity Tolerance Patient tolerated treatment well   Behavior During Therapy [name] for tasks assessed/performed      Past Medical History  Diagnosis Date  . Arthritis     left knee  . Depression   . De Quervain's tenosynovitis, left 04/2012  . Tremors of nervous system     takes propranolol for tremors  . History of radiation therapy 3 years ago  . Prostate cancer, primary, with metastasis from prostate to other site     metastasis to lymph nodes  . Complication of anesthesia     pt denies ponv, pt woke up during colonscopy in past  . Diabetes mellitus     Insulin pump, takes lisinopril for kidneys, dr Buddy Duty endocrinologist    Past Surgical History  Procedure Laterality Date  . Hemorrhoid surgery  1970s  . Anterior cruciate ligament repair      right knee  . Wrist arthroscopy  05/03/2010    right; and release of 1st dorsal compartment  . Knee arthrotomy  03/18/2008    left; with scar exc.  [name] Knee arthroscopy  09/25/2007; 04/15/2005    left  . Knee joint manipulation  06/29/2007    left  . Total knee arthroplasty  04/29/2007    left  . Dorsal compartment release  04/21/2012    Procedure: RELEASE DORSAL COMPARTMENT (DEQUERVAIN);  Surgeon: Cammie Sickle., MD;  Location: [facility];  Service: Orthopedics;  Laterality: Left;  left 1st dorsal compartment release left wrist   . Minor amputation of digit Left 07/21/2013    Procedure: LEFT  SMALL REVISION AMPUTATION (MINOR PROCEDURE) ;  Surgeon: Schuyler Amor, MD;  Location: [place];  Service: Orthopedics;  Laterality: Left;  . Colonoscopy with propofol N/A 04/07/2014    Procedure: COLONOSCOPY WITH PROPOFOL;  Surgeon: Jeryl Columbia, MD;  Location: [facility];  Service: Endoscopy;  Laterality: N/A;  . Total knee revision Left 01/25/2015    Procedure: LEFT TOTAL KNEE  ARTHOPLASTY REVISION;  Surgeon: Gaynelle Arabian, MD;  Location: [facility];  Service: Orthopedics;  Laterality: Left;    There were no vitals filed for this visit.  Visit Diagnosis:  Knee stiffness, left  Weakness of left lower extremity  Swelling of limb      Subjective Assessment - 04/10/15 1437    Subjective Fell down flight of stairs last night onto tile. Reports his knee hurts more since this episode.    Currently in Pain? Yes   Pain Score 8    Pain Location Knee   Pain Orientation Left   Pain Descriptors / Indicators Sharp;Sore;Tightness   Aggravating Factors  bending   Pain Relieving Factors Ice, meds   Multiple Pain Sites No            [facility] PT Assessment - 04/10/15 0001    AROM   Overall AROM Comments 89 degrees flexion  [facility] Adult PT Treatment/Exercise - 04/10/15 0001    Lumbar Exercises: Machines for Strengthening   Leg Press ST #6 70# 3x 10,    Knee/Hip Exercises: Aerobic   Stationary Bike Full fwd ROM L1 x10 min   Knee/Hip Exercises: Standing   Rebounder 3 way shift 1 min each   Electrical Stimulation   Electrical Stimulation Location Lt knee   Electrical Stimulation Action IFC concurrent with GAME READY   Electrical Stimulation Goals Pain   Vasopneumatic   Number Minutes Vasopneumatic  15 minutes   Vasopnuematic Location  Knee   Vasopneumatic Pressure High   Vasopneumatic Temperature  3 snowflakes   Manual Therapy   Manual Therapy Soft tissue mobilization  along incision, surrounding patella, hamstring and quadricep    Passive ROM to left knee for flexion                  PT Short Term Goals - 04/10/15 1451    PT SHORT TERM GOAL #1   Title Independent with HEP    Time 4   Period Weeks   Status Achieved   PT SHORT TERM GOAL #2   Title Improve Lt knee flexion AROM to 95 degrees to improve ability to do elliptical    Time 4   Period Weeks   Status On-going  Showing improvement this week and end of last.           PT Long Term Goals - 04/03/15 1538    PT LONG TERM GOAL #1   Title Be independent with advanced HEP    Time 8   Period Weeks   Status On-going   PT LONG TERM GOAL #2   Title Improve FOTO disabiltiy to < or = to 43%   Time 8   Period Weeks   Status On-going   PT LONG TERM GOAL #3   Title Increase LE strength to return to 1 hour cardio workout on elliptical    Time 8   Period Weeks   Status On-going   PT LONG TERM GOAL #4   Title Improve knee flexion AROM to 105 in order to use riding lawnmower    Time 8   Period Weeks   Status On-going   PT LONG TERM GOAL #5   Title wean from the cane to no device for community distances > or = to 75% of the time   Time 8   Period Weeks   Status On-going               Plan - 04/10/15 1521    Clinical Impression Statement Pt had fall yesterday down flight of stairs falling on his knee. Pt sees MD tomorrow. He wa able to complete all hs normal exercises wihtout nay increased pain and his ROM measurement did not worsen.   Pt will benefit from skilled therapeutic intervention in order to improve on the following deficits Decreased range of motion;Difficulty walking;Decreased endurance;Decreased activity tolerance;Pain;Decreased scar mobility;Increased edema;Decreased strength   Rehab Potential Good   PT Frequency 3x / week   PT Duration 8 weeks   PT Treatment/Interventions Moist Heat;Ultrasound;Vasopneumatic Device;Cryotherapy;Electrical Stimulation;Gait training;Functional mobility training;Therapeutic  exercise;Neuromuscular re-education;Patient/family education;Passive range of motion;Energy conservation;Visual/perceptual remediation/compensation   PT Next Visit Plan To MD   Consulted and Agree with Plan of Care Patient        Problem List Patient Active Problem List   Diagnosis Date Noted  . Failed total knee arthroplasty 01/25/2015  . OA (osteoarthritis) of knee 01/25/2015  .  Essential tremor 03/09/2014  . Facial spasm 04/14/2013  . Arthritis   . Depression   . Hypertension   . Prostate cancer, primary, with metastasis from prostate to other site   . PONV (postoperative nausea and vomiting)   . De Quervain's tenosynovitis, left 04/13/2012    Brice Potteiger, PTA 04/10/2015, 3:24 PM  [facility] [address] [place], [place], [address] Phone: [phone]   Fax:  [phone]

## 2015-04-12 ENCOUNTER — Telehealth: Payer: Self-pay | Admitting: *Deleted

## 2015-04-12 ENCOUNTER — Other Ambulatory Visit: Payer: Self-pay | Admitting: Neurology

## 2015-04-12 ENCOUNTER — Telehealth: Payer: Self-pay

## 2015-04-12 ENCOUNTER — Ambulatory Visit: Payer: 59

## 2015-04-12 DIAGNOSIS — M7989 Other specified soft tissue disorders: Secondary | ICD-10-CM

## 2015-04-12 DIAGNOSIS — M25662 Stiffness of left knee, not elsewhere classified: Secondary | ICD-10-CM | POA: Diagnosis not present

## 2015-04-12 DIAGNOSIS — R29898 Other symptoms and signs involving the musculoskeletal system: Secondary | ICD-10-CM

## 2015-04-12 MED ORDER — CLONAZEPAM 0.5 MG PO TABS: ORAL_TABLET | ORAL | Status: DC

## 2015-04-12 NOTE — Telephone Encounter (Signed)
RX with new directions faxed to [facility].

## 2015-04-12 NOTE — Telephone Encounter (Signed)
Dale Morrow called saying he's received a new insulin pump and he needs advice on how to program it. He's awaiting your call. Pt ph# [phone] Thank you.

## 2015-04-12 NOTE — Therapy (Signed)
[facility] [address] [place], [place], [address] Phone: [phone]   Fax:  [phone]  Physical Therapy Treatment  Patient Details  Name: Dale Morrow MRN: [id number] Date of Birth: 04-28-1941 Referring Provider:  Gaynelle Arabian, MD  Encounter Date: 04/12/2015      PT End of Session - 04/12/15 0755    Visit Number 8   Number of Visits 10  [place]   Date for PT Re-Evaluation 05/23/15   PT Start Time 0800   PT Stop Time 0902   PT Time Calculation (min) 62 min   Activity Tolerance Patient tolerated treatment well   Behavior During Therapy [facility] for tasks assessed/performed      Past Medical History  Diagnosis Date  . Arthritis     left knee  . Depression   . De Quervain's tenosynovitis, left 04/2012  . Tremors of nervous system     takes propranolol for tremors  . History of radiation therapy 3 years ago  . Prostate cancer, primary, with metastasis from prostate to other site     metastasis to lymph nodes  . Complication of anesthesia     pt denies ponv, pt woke up during colonscopy in past  . Diabetes mellitus     Insulin pump, takes lisinopril for kidneys, dr Buddy Duty endocrinologist    Past Surgical History  Procedure Laterality Date  . Hemorrhoid surgery  1970s  . Anterior cruciate ligament repair      right knee  . Wrist arthroscopy  05/03/2010    right; and release of 1st dorsal compartment  . Knee arthrotomy  03/18/2008    left; with scar exc.  [name] Knee arthroscopy  09/25/2007; 04/15/2005    left  . Knee joint manipulation  06/29/2007    left  . Total knee arthroplasty  04/29/2007    left  . Dorsal compartment release  04/21/2012    Procedure: RELEASE DORSAL COMPARTMENT (DEQUERVAIN);  Surgeon: Cammie Sickle., MD;  Location: [facility];  Service: Orthopedics;  Laterality: Left;  left 1st dorsal compartment release left wrist   . Minor amputation of digit Left 07/21/2013   Procedure: LEFT SMALL REVISION AMPUTATION (MINOR PROCEDURE) ;  Surgeon: Schuyler Amor, MD;  Location: [place] [facility];  Service: Orthopedics;  Laterality: Left;  . Colonoscopy with propofol N/A 04/07/2014    Procedure: COLONOSCOPY WITH PROPOFOL;  Surgeon: Jeryl Columbia, MD;  Location: [facility];  Service: Endoscopy;  Laterality: N/A;  . Total knee revision Left 01/25/2015    Procedure: LEFT TOTAL KNEE  ARTHOPLASTY REVISION;  Surgeon: Gaynelle Arabian, MD;  Location: [facility];  Service: Orthopedics;  Laterality: Left;    There were no vitals filed for this visit.  Visit Diagnosis:  Knee stiffness, left  Weakness of left lower extremity  Swelling of limb      Subjective Assessment - 04/12/15 0758    Subjective Painful to walk. Went to MD yesterday; stated that he was doing fine but would like more ROM.    How long can you stand comfortably? 5 minutes before pain starts    How long can you walk comfortably? 10 minutes     Patient Stated Goals Get ROM back, ride mountain bike, working out at [employer] (ellipitical, 1 hour cardio) weight program    Currently in Pain? Yes   Pain Score 4    Pain Location Knee   Pain Orientation Left   Pain Descriptors / Indicators  Aching;Sharp   Pain Type Surgical pain   Pain Onset More than a month ago   Pain Frequency Constant   Aggravating Factors  Bending    Pain Relieving Factors Ice, meds                          [facility] Adult PT Treatment/Exercise - 04/12/15 0001    Lumbar Exercises: Machines for Strengthening   Leg Press ST #6 70# 3x 10, 35# Lt leg only   Knee/Hip Exercises: Aerobic   Stationary Bike Full fwd ROM L1 x10 min   Knee/Hip Exercises: Standing   Forward Step Up Hand Hold: 0;Both;20 reps   Rebounder SLS x 1 min   2x each leg    Walking with Sports Cord 30# forward and reverse:   10x each direction    Acupuncturist Stimulation Location Lt knee   Electrical Stimulation Action IFC  with GameReady   Electrical Stimulation Goals Pain   Vasopneumatic   Number Minutes Vasopneumatic  15 minutes   Vasopnuematic Location  Knee   Vasopneumatic Pressure High   Vasopneumatic Temperature  3 snowflakes   Manual Therapy   Manual Therapy Joint mobilization   Manual therapy comments Grade 3 and 4 at tibiofemoral joint   Knee flexion mobilization                   PT Short Term Goals - 04/10/15 1451    PT SHORT TERM GOAL #1   Title Independent with HEP    Time 4   Period Weeks   Status Achieved   PT SHORT TERM GOAL #2   Title Improve Lt knee flexion AROM to 95 degrees to improve ability to do elliptical    Time 4   Period Weeks   Status On-going  Showing improvement this week and end of last.           PT Long Term Goals - 04/03/15 1538    PT LONG TERM GOAL #1   Title Be independent with advanced HEP    Time 8   Period Weeks   Status On-going   PT LONG TERM GOAL #2   Title Improve FOTO disabiltiy to < or = to 43%   Time 8   Period Weeks   Status On-going   PT LONG TERM GOAL #3   Title Increase LE strength to return to 1 hour cardio workout on elliptical    Time 8   Period Weeks   Status On-going   PT LONG TERM GOAL #4   Title Improve knee flexion AROM to 105 in order to use riding lawnmower    Time 8   Period Weeks   Status On-going   PT LONG TERM GOAL #5   Title wean from the cane to no device for community distances > or = to 75% of the time   Time 8   Period Weeks   Status On-going               Plan - 04/12/15 6144    Clinical Impression Statement Pt continues to improve with strengthening and proprioceptive knee exercises. Flexion ROM has a springy end feel indicating soft tissue tightness limiting motion. Will benefit from skilled PT for continued LE strength and ROM of Lt. knee.     Pt will benefit from skilled therapeutic intervention in order to improve on the following deficits Decreased range of motion;Difficulty  walking;Decreased endurance;Decreased  activity tolerance;Pain;Decreased scar mobility;Increased edema;Decreased strength   Rehab Potential Good   PT Frequency 3x / week   PT Duration 8 weeks   PT Treatment/Interventions Moist Heat;Ultrasound;Vasopneumatic Device;Cryotherapy;Electrical Stimulation;Gait training;Functional mobility training;Therapeutic exercise;Neuromuscular re-education;Patient/family education;Passive range of motion;Energy conservation;Visual/perceptual remediation/compensation   PT Next Visit Plan Continue to leg strengthening, try knee flexion mobilization in prone (if pt can tolerate position), soft tissue to hamstrings/posterior capsule, try elliptical    Consulted and Agree with Plan of Care Patient        Problem List Patient Active Problem List   Diagnosis Date Noted  . Failed total knee arthroplasty 01/25/2015  . OA (osteoarthritis) of knee 01/25/2015  . Essential tremor 03/09/2014  . Facial spasm 04/14/2013  . Arthritis   . Depression   . Hypertension   . Prostate cancer, primary, with metastasis from prostate to other site   . PONV (postoperative nausea and vomiting)   . De Quervain's tenosynovitis, left 04/13/2012   Reginal Lutes, SPT 04/12/2015 9:25 AM  During this treatment session, the therapist was present, participating in, and directing the treatment. TAKACS,KELLY 04/12/2015, 9:25 AM  [facility] [address], [place], [place], [address] Phone: [phone]   Fax:  [phone]

## 2015-04-12 NOTE — Telephone Encounter (Signed)
Patient called requesting refill for clonazePAM (KLONOPIN) 0.5 MG tablet . He going out of town on Friday. He is requesting a 90 day supply. Patient can be reached at [phone].

## 2015-04-12 NOTE — Telephone Encounter (Signed)
Patient called stating he is taking Clonazepam 0.5mg  one twice daily and two at night.  He is requesting a new Rx with these directions.  States this dose has helped him tremendously.  He prefers a 90 day Rx (#360) if permissible.  Please advise.  Thank you.

## 2015-04-12 NOTE — Telephone Encounter (Signed)
I will increase his clonazepam 0.5 milligrams to reflect the changes, maximum 4 tablets a day, 120 tablets each months, with 3 refills, I will not write 90 day supply at once.

## 2015-04-12 NOTE — Telephone Encounter (Signed)
I assisted patient in setting up his replacement pump settings over the phone.

## 2015-04-12 NOTE — Telephone Encounter (Signed)
I called the patient back to advise.  Got no answer.  Left message.

## 2015-04-14 ENCOUNTER — Encounter: Payer: Self-pay | Admitting: Physical Therapy

## 2015-04-14 ENCOUNTER — Ambulatory Visit: Payer: 59 | Attending: Orthopedic Surgery | Admitting: Physical Therapy

## 2015-04-14 DIAGNOSIS — R29898 Other symptoms and signs involving the musculoskeletal system: Secondary | ICD-10-CM | POA: Diagnosis present

## 2015-04-14 DIAGNOSIS — M7989 Other specified soft tissue disorders: Secondary | ICD-10-CM | POA: Diagnosis present

## 2015-04-14 DIAGNOSIS — M25662 Stiffness of left knee, not elsewhere classified: Secondary | ICD-10-CM | POA: Diagnosis present

## 2015-04-14 NOTE — Therapy (Signed)
[facility] [address] [place], [place], [address] Phone: [phone]   Fax:  [phone]  Physical Therapy Treatment  Patient Details  Name: Dale Morrow MRN: [id number] Date of Birth: 1941/10/03 Referring Provider:  Maurice Small, MD  Encounter Date: 04/14/2015      PT End of Session - 04/14/15 0928    Visit Number 9   Number of Visits 10   Date for PT Re-Evaluation 05/23/15   PT Start Time 0830   PT Stop Time 0938   PT Time Calculation (min) 68 min   Activity Tolerance Patient tolerated treatment well   Behavior During Therapy [facility] for tasks assessed/performed      Past Medical History  Diagnosis Date  . Arthritis     left knee  . Depression   . De Quervain's tenosynovitis, left 04/2012  . Tremors of nervous system     takes propranolol for tremors  . History of radiation therapy 3 years ago  . Prostate cancer, primary, with metastasis from prostate to other site     metastasis to lymph nodes  . Complication of anesthesia     pt denies ponv, pt woke up during colonscopy in past  . Diabetes mellitus     Insulin pump, takes lisinopril for kidneys, dr Buddy Duty endocrinologist    Past Surgical History  Procedure Laterality Date  . Hemorrhoid surgery  1970s  . Anterior cruciate ligament repair      right knee  . Wrist arthroscopy  05/03/2010    right; and release of 1st dorsal compartment  . Knee arthrotomy  03/18/2008    left; with scar exc.  [name] Knee arthroscopy  09/25/2007; 04/15/2005    left  . Knee joint manipulation  06/29/2007    left  . Total knee arthroplasty  04/29/2007    left  . Dorsal compartment release  04/21/2012    Procedure: RELEASE DORSAL COMPARTMENT (DEQUERVAIN);  Surgeon: Cammie Sickle., MD;  [facility]: [facility];  Service: Orthopedics;  Laterality: Left;  left 1st dorsal compartment release left wrist   . Minor amputation of digit Left 07/21/2013    Procedure: LEFT  SMALL REVISION AMPUTATION (MINOR PROCEDURE) ;  Surgeon: Schuyler Amor, MD;  Location: [facility];  Service: Orthopedics;  Laterality: Left;  . Colonoscopy with propofol N/A 04/07/2014    Procedure: COLONOSCOPY WITH PROPOFOL;  Surgeon: Jeryl Columbia, MD;  Location: [facility];  Service: Endoscopy;  Laterality: N/A;  . Total knee revision Left 01/25/2015    Procedure: LEFT TOTAL KNEE  ARTHOPLASTY REVISION;  Surgeon: Gaynelle Arabian, MD;  Location: [facility];  Service: Orthopedics;  Laterality: Left;    There were no vitals filed for this visit.  Visit Diagnosis:  Knee stiffness, left  Weakness of left lower extremity  Swelling of limb      Subjective Assessment - 04/14/15 0854    Subjective Stiill painful with walking and when he wakes up in the morning. Pt reports MD was pleased with ROM in Lt knee   Currently in Pain? Yes   Pain Score 4    Pain Location Knee   Pain Orientation Left   Pain Descriptors / Indicators Aching;Sharp   Pain Type Surgical pain   Pain Onset More than a month ago   Multiple Pain Sites No  [place] Adult PT Treatment/Exercise - 04/14/15 0001    Lumbar Exercises: Machines for Strengthening   Leg Press ST #6 80# 3x 10, 40# Lt leg only  pt tolerated incr in weight well   Knee/Hip Exercises: Aerobic   Stationary Bike Full fwd ROM L1 x10 min   Knee/Hip Exercises: Standing   Lateral Step Up Left;20 reps;Hand Hold: 0   Forward Step Up Left;20 reps;Hand Hold: 0   SLS 3 x 20 sec on Lt knee  modified at times to maintain balance   Rebounder 3way shift x 1 min each   Walking with Sports Cord 30# forward/reverse & 35# backward/reverse:    Glass blower/designer Action IFC concurrent with Game Ready   Printmaker Goals Pain   Vasopneumatic   Number Minutes Vasopneumatic  15 minutes   Vasopnuematic Location  Knee   Vasopneumatic  Pressure High   Vasopneumatic Temperature  3 snowflakes   Manual Therapy   Manual Therapy Soft tissue mobilization  along incision and surrounding tissue to lossen adhesions                  PT Short Term Goals - 04/10/15 1451    PT SHORT TERM GOAL #1   Title Independent with HEP    Time 4   Period Weeks   Status Achieved   PT SHORT TERM GOAL #2   Title Improve Lt knee flexion AROM to 95 degrees to improve ability to do elliptical    Time 4   Period Weeks   Status On-going  Showing improvement this week and end of last.           PT Long Term Goals - 04/03/15 1538    PT LONG TERM GOAL #1   Title Be independent with advanced HEP    Time 8   Period Weeks   Status On-going   PT LONG TERM GOAL #2   Title Improve FOTO disabiltiy to < or = to 43%   Time 8   Period Weeks   Status On-going   PT LONG TERM GOAL #3   Title Increase LE strength to return to 1 hour cardio workout on elliptical    Time 8   Period Weeks   Status On-going   PT LONG TERM GOAL #4   Title Improve knee flexion AROM to 105 in order to use riding lawnmower    Time 8   Period Weeks   Status On-going   PT LONG TERM GOAL #5   Title wean from the cane to no device for community distances > or = to 75% of the time   Time 8   Period Weeks   Status On-going               Plan - 04/14/15 0929    Clinical Impression Statement Pt continues to improve with strength as noted with incr weight on legpress. tighness around patell restricts ROM and causes discomfort. Pt will continue to benefit from PT to continue to improve   Pt will benefit from skilled therapeutic intervention in order to improve on the following deficits Decreased range of motion;Difficulty walking;Decreased endurance;Decreased activity tolerance;Pain;Decreased scar mobility;Increased edema;Decreased strength   Rehab Potential Good   PT Frequency 3x / week   PT Duration 8 weeks   PT Treatment/Interventions Moist  Heat;Ultrasound;Vasopneumatic Device;Cryotherapy;Electrical Stimulation;Gait training;Functional mobility training;Therapeutic exercise;Neuromuscular re-education;Patient/family education;Passive range of motion;Energy conservation;Visual/perceptual remediation/compensation   PT Next Visit  Plan Continue to leg strengthening, try knee flexion mobilization in prone (if pt can tolerate position), soft tissue to hamstrings/posterior capsule, try elliptical    Consulted and Agree with Plan of Care Patient        Problem List Patient Active Problem List   Diagnosis Date Noted  . Failed total knee arthroplasty 01/25/2015  . OA (osteoarthritis) of knee 01/25/2015  . Essential tremor 03/09/2014  . Facial spasm 04/14/2013  . Arthritis   . Depression   . Hypertension   . Prostate cancer, primary, with metastasis from prostate to other site   . PONV (postoperative nausea and vomiting)   . De Quervain's tenosynovitis, left 04/13/2012    NAUMANN-HOUEGNIFIO,Michaila Kenney PTA 04/14/2015, 9:35 AM  [facility] [address], [place], [place], [address] Phone: [phone]   Fax:  [phone]

## 2015-04-19 ENCOUNTER — Encounter: Payer: Medicare Other | Admitting: Physical Therapy

## 2015-04-19 ENCOUNTER — Ambulatory Visit (HOSPITAL_COMMUNITY): Payer: 59

## 2015-04-19 ENCOUNTER — Emergency Department (HOSPITAL_COMMUNITY): Payer: 59

## 2015-04-19 ENCOUNTER — Ambulatory Visit: Payer: Medicare Other | Admitting: *Deleted

## 2015-04-19 ENCOUNTER — Inpatient Hospital Stay (HOSPITAL_COMMUNITY)
Admission: EM | Admit: 2015-04-19 | Discharge: 2015-04-26 | DRG: 637 | Disposition: A | Discharge status: Skilled Nursing Facility | Payer: 59 | Attending: Internal Medicine | Admitting: Internal Medicine

## 2015-04-19 ENCOUNTER — Inpatient Hospital Stay (HOSPITAL_COMMUNITY): Payer: 59

## 2015-04-19 ENCOUNTER — Encounter (HOSPITAL_COMMUNITY): Payer: Self-pay

## 2015-04-19 DIAGNOSIS — C779 Secondary and unspecified malignant neoplasm of lymph node, unspecified: Secondary | ICD-10-CM | POA: Diagnosis present

## 2015-04-19 DIAGNOSIS — M25562 Pain in left knee: Secondary | ICD-10-CM | POA: Diagnosis present

## 2015-04-19 DIAGNOSIS — J9600 Acute respiratory failure, unspecified whether with hypoxia or hypercapnia: Secondary | ICD-10-CM | POA: Diagnosis not present

## 2015-04-19 DIAGNOSIS — E10649 Type 1 diabetes mellitus with hypoglycemia without coma: Secondary | ICD-10-CM | POA: Diagnosis present

## 2015-04-19 DIAGNOSIS — Z7901 Long term (current) use of anticoagulants: Secondary | ICD-10-CM

## 2015-04-19 DIAGNOSIS — I5181 Takotsubo syndrome: Secondary | ICD-10-CM | POA: Diagnosis present

## 2015-04-19 DIAGNOSIS — J690 Pneumonitis due to inhalation of food and vomit: Secondary | ICD-10-CM | POA: Diagnosis present

## 2015-04-19 DIAGNOSIS — I10 Essential (primary) hypertension: Secondary | ICD-10-CM | POA: Diagnosis present

## 2015-04-19 DIAGNOSIS — R401 Stupor: Secondary | ICD-10-CM | POA: Diagnosis not present

## 2015-04-19 DIAGNOSIS — Z96652 Presence of left artificial knee joint: Secondary | ICD-10-CM | POA: Diagnosis present

## 2015-04-19 DIAGNOSIS — Z91013 Allergy to seafood: Secondary | ICD-10-CM | POA: Diagnosis not present

## 2015-04-19 DIAGNOSIS — R404 Transient alteration of awareness: Secondary | ICD-10-CM | POA: Diagnosis not present

## 2015-04-19 DIAGNOSIS — R4182 Altered mental status, unspecified: Secondary | ICD-10-CM | POA: Insufficient documentation

## 2015-04-19 DIAGNOSIS — Z8546 Personal history of malignant neoplasm of prostate: Secondary | ICD-10-CM

## 2015-04-19 DIAGNOSIS — Z79899 Other long term (current) drug therapy: Secondary | ICD-10-CM

## 2015-04-19 DIAGNOSIS — R251 Tremor, unspecified: Secondary | ICD-10-CM | POA: Diagnosis present

## 2015-04-19 DIAGNOSIS — D649 Anemia, unspecified: Secondary | ICD-10-CM | POA: Diagnosis present

## 2015-04-19 DIAGNOSIS — Z794 Long term (current) use of insulin: Secondary | ICD-10-CM

## 2015-04-19 DIAGNOSIS — R569 Unspecified convulsions: Secondary | ICD-10-CM | POA: Diagnosis not present

## 2015-04-19 DIAGNOSIS — Z91030 Bee allergy status: Secondary | ICD-10-CM | POA: Diagnosis not present

## 2015-04-19 DIAGNOSIS — G9340 Encephalopathy, unspecified: Secondary | ICD-10-CM | POA: Insufficient documentation

## 2015-04-19 DIAGNOSIS — R7989 Other specified abnormal findings of blood chemistry: Secondary | ICD-10-CM

## 2015-04-19 DIAGNOSIS — I6789 Other cerebrovascular disease: Secondary | ICD-10-CM | POA: Diagnosis not present

## 2015-04-19 DIAGNOSIS — I5021 Acute systolic (congestive) heart failure: Secondary | ICD-10-CM | POA: Diagnosis present

## 2015-04-19 DIAGNOSIS — J189 Pneumonia, unspecified organism: Secondary | ICD-10-CM

## 2015-04-19 DIAGNOSIS — F329 Major depressive disorder, single episode, unspecified: Secondary | ICD-10-CM | POA: Diagnosis present

## 2015-04-19 DIAGNOSIS — I2510 Atherosclerotic heart disease of native coronary artery without angina pectoris: Secondary | ICD-10-CM | POA: Diagnosis present

## 2015-04-19 DIAGNOSIS — G40901 Epilepsy, unspecified, not intractable, with status epilepticus: Secondary | ICD-10-CM | POA: Diagnosis present

## 2015-04-19 DIAGNOSIS — Z923 Personal history of irradiation: Secondary | ICD-10-CM

## 2015-04-19 DIAGNOSIS — G40301 Generalized idiopathic epilepsy and epileptic syndromes, not intractable, with status epilepticus: Secondary | ICD-10-CM | POA: Diagnosis present

## 2015-04-19 DIAGNOSIS — I248 Other forms of acute ischemic heart disease: Secondary | ICD-10-CM | POA: Diagnosis present

## 2015-04-19 DIAGNOSIS — I420 Dilated cardiomyopathy: Secondary | ICD-10-CM | POA: Diagnosis not present

## 2015-04-19 DIAGNOSIS — G40211 Localization-related (focal) (partial) symptomatic epilepsy and epileptic syndromes with complex partial seizures, intractable, with status epilepticus: Secondary | ICD-10-CM

## 2015-04-19 DIAGNOSIS — I5023 Acute on chronic systolic (congestive) heart failure: Secondary | ICD-10-CM | POA: Diagnosis not present

## 2015-04-19 DIAGNOSIS — R40243 Glasgow coma scale score 3-8: Secondary | ICD-10-CM | POA: Diagnosis not present

## 2015-04-19 DIAGNOSIS — R197 Diarrhea, unspecified: Secondary | ICD-10-CM | POA: Diagnosis not present

## 2015-04-19 DIAGNOSIS — Z87891 Personal history of nicotine dependence: Secondary | ICD-10-CM | POA: Diagnosis not present

## 2015-04-19 DIAGNOSIS — E872 Acidosis: Secondary | ICD-10-CM | POA: Diagnosis present

## 2015-04-19 DIAGNOSIS — I429 Cardiomyopathy, unspecified: Secondary | ICD-10-CM

## 2015-04-19 DIAGNOSIS — E876 Hypokalemia: Secondary | ICD-10-CM | POA: Diagnosis present

## 2015-04-19 DIAGNOSIS — J9601 Acute respiratory failure with hypoxia: Secondary | ICD-10-CM | POA: Diagnosis not present

## 2015-04-19 DIAGNOSIS — R778 Other specified abnormalities of plasma proteins: Secondary | ICD-10-CM

## 2015-04-19 HISTORY — DX: Bell's palsy: G51.0

## 2015-04-19 LAB — URINALYSIS, ROUTINE W REFLEX MICROSCOPIC
Bilirubin Urine: NEGATIVE
Glucose, UA: 250 mg/dL — AB
HGB URINE DIPSTICK: NEGATIVE
KETONES UR: 15 mg/dL — AB
LEUKOCYTES UA: NEGATIVE
NITRITE: NEGATIVE
Protein, ur: 30 mg/dL — AB
Specific Gravity, Urine: 1.03 (ref 1.005–1.030)
Urobilinogen, UA: 0.2 mg/dL (ref 0.0–1.0)
pH: 6 (ref 5.0–8.0)

## 2015-04-19 LAB — CBC
HEMATOCRIT: 36 % — AB (ref 39.0–52.0)
HEMATOCRIT: 37.1 % — AB (ref 39.0–52.0)
HEMOGLOBIN: 11.9 g/dL — AB (ref 13.0–17.0)
Hemoglobin: 11.7 g/dL — ABNORMAL LOW (ref 13.0–17.0)
MCH: 28.1 pg (ref 26.0–34.0)
MCH: 28.3 pg (ref 26.0–34.0)
MCHC: 32.1 g/dL (ref 30.0–36.0)
MCHC: 32.5 g/dL (ref 30.0–36.0)
MCV: 87.5 fL (ref 78.0–100.0)
MCV: 87 fL (ref 78.0–100.0)
PLATELETS: 307 10*3/uL (ref 150–400)
Platelets: 260 10*3/uL (ref 150–400)
RBC: 4.24 MIL/uL (ref 4.22–5.81)
RBC: 4.14 MIL/uL — AB (ref 4.22–5.81)
RDW: 15.6 % — ABNORMAL HIGH (ref 11.5–15.5)
RDW: 15.6 % — ABNORMAL HIGH (ref 11.5–15.5)
WBC: 11.8 10*3/uL — ABNORMAL HIGH (ref 4.0–10.5)
WBC: 11.2 10*3/uL — ABNORMAL HIGH (ref 4.0–10.5)

## 2015-04-19 LAB — APTT: APTT: 30 s (ref 24–37)

## 2015-04-19 LAB — BLOOD GAS, ARTERIAL
ACID-BASE DEFICIT: 6 mmol/L — AB (ref 0.0–2.0)
BICARBONATE: 18.2 meq/L — AB (ref 20.0–24.0)
DRAWN BY: 36277
FIO2: 1 %
MECHVT: 500 mL
O2 SAT: 99.7 %
PATIENT TEMPERATURE: 98.6
PEEP: 5 cmH2O
PO2 ART: 379 mmHg — AB (ref 80.0–100.0)
RATE: 20 resp/min
TCO2: 19.1 mmol/L (ref 0–100)
pCO2 arterial: 31.6 mmHg — ABNORMAL LOW (ref 35.0–45.0)
pH, Arterial: 7.378 (ref 7.350–7.450)

## 2015-04-19 LAB — BASIC METABOLIC PANEL
Anion gap: 12 (ref 5–15)
BUN: 20 mg/dL (ref 6–20)
CO2: 21 mmol/L — AB (ref 22–32)
CREATININE: 0.85 mg/dL (ref 0.61–1.24)
Calcium: 8.3 mg/dL — ABNORMAL LOW (ref 8.9–10.3)
Chloride: 104 mmol/L (ref 101–111)
GLUCOSE: 183 mg/dL — AB (ref 65–99)
Potassium: 5.1 mmol/L (ref 3.5–5.1)
Sodium: 137 mmol/L (ref 135–145)

## 2015-04-19 LAB — DIFFERENTIAL
BASOS ABS: 0 10*3/uL (ref 0.0–0.1)
Basophils Relative: 0 % (ref 0–1)
EOS ABS: 0 10*3/uL (ref 0.0–0.7)
EOS PCT: 0 % (ref 0–5)
LYMPHS PCT: 8 % — AB (ref 12–46)
Lymphs Abs: 1 10*3/uL (ref 0.7–4.0)
MONO ABS: 0.6 10*3/uL (ref 0.1–1.0)
Monocytes Relative: 5 % (ref 3–12)
Neutro Abs: 10.2 10*3/uL — ABNORMAL HIGH (ref 1.7–7.7)
Neutrophils Relative %: 87 % — ABNORMAL HIGH (ref 43–77)

## 2015-04-19 LAB — PROCALCITONIN

## 2015-04-19 LAB — RAPID URINE DRUG SCREEN, HOSP PERFORMED
Amphetamines: NOT DETECTED
BARBITURATES: POSITIVE — AB
BENZODIAZEPINES: POSITIVE — AB
Cocaine: NOT DETECTED
Opiates: POSITIVE — AB
Tetrahydrocannabinol: NOT DETECTED

## 2015-04-19 LAB — I-STAT ARTERIAL BLOOD GAS, ED
ACID-BASE DEFICIT: 5 mmol/L — AB (ref 0.0–2.0)
Bicarbonate: 21.4 mEq/L (ref 20.0–24.0)
O2 SAT: 100 %
TCO2: 23 mmol/L (ref 0–100)
pCO2 arterial: 44.9 mmHg (ref 35.0–45.0)
pH, Arterial: 7.286 — ABNORMAL LOW (ref 7.350–7.450)
pO2, Arterial: 241 mmHg — ABNORMAL HIGH (ref 80.0–100.0)

## 2015-04-19 LAB — TRIGLYCERIDES: Triglycerides: 63 mg/dL (ref <150)

## 2015-04-19 LAB — CREATININE, SERUM
Creatinine, Ser: 1.01 mg/dL (ref 0.61–1.24)
GFR calc Af Amer: >60 mL/min (ref >60)

## 2015-04-19 LAB — I-STAT CG4 LACTIC ACID, ED: LACTIC ACID, VENOUS: 10.2 mmol/L — AB (ref 0.5–2.0)

## 2015-04-19 LAB — I-STAT CHEM 8, ED
BUN: 27 mg/dL — ABNORMAL HIGH (ref 6–20)
CHLORIDE: 102 mmol/L (ref 101–111)
Calcium, Ion: 1.15 mmol/L (ref 1.13–1.30)
Creatinine, Ser: 0.8 mg/dL (ref 0.61–1.24)
Glucose, Bld: 185 mg/dL — ABNORMAL HIGH (ref 65–99)
HEMATOCRIT: 40 % (ref 39.0–52.0)
Hemoglobin: 13.6 g/dL (ref 13.0–17.0)
POTASSIUM: 4.5 mmol/L (ref 3.5–5.1)
SODIUM: 137 mmol/L (ref 135–145)
TCO2: 17 mmol/L (ref 0–100)

## 2015-04-19 LAB — COMPREHENSIVE METABOLIC PANEL
ALK PHOS: 71 U/L (ref 38–126)
ALT: 17 U/L (ref 17–63)
ANION GAP: 18 — AB (ref 5–15)
AST: 50 U/L — ABNORMAL HIGH (ref 15–41)
Albumin: 3.2 g/dL — ABNORMAL LOW (ref 3.5–5.0)
BUN: 22 mg/dL — AB (ref 6–20)
CALCIUM: 8.6 mg/dL — AB (ref 8.9–10.3)
CO2: 15 mmol/L — ABNORMAL LOW (ref 22–32)
Chloride: 104 mmol/L (ref 101–111)
Creatinine, Ser: 1.11 mg/dL (ref 0.61–1.24)
GFR calc non Af Amer: >60 mL/min (ref >60)
GLUCOSE: 185 mg/dL — AB (ref 65–99)
POTASSIUM: 4.6 mmol/L (ref 3.5–5.1)
SODIUM: 137 mmol/L (ref 135–145)
Total Bilirubin: 0.4 mg/dL (ref 0.3–1.2)
Total Protein: 6.7 g/dL (ref 6.5–8.1)

## 2015-04-19 LAB — I-STAT TROPONIN, ED: TROPONIN I, POC: 1.22 ng/mL — AB (ref 0.00–0.08)

## 2015-04-19 LAB — URINE MICROSCOPIC-ADD ON

## 2015-04-19 LAB — GLUCOSE, CAPILLARY
GLUCOSE-CAPILLARY: 161 mg/dL — AB (ref 65–99)
Glucose-Capillary: 239 mg/dL — ABNORMAL HIGH (ref 65–99)
Glucose-Capillary: 259 mg/dL — ABNORMAL HIGH (ref 65–99)
Glucose-Capillary: 285 mg/dL — ABNORMAL HIGH (ref 65–99)
Glucose-Capillary: 146 mg/dL — ABNORMAL HIGH (ref 65–99)

## 2015-04-19 LAB — CBG MONITORING, ED: Glucose-Capillary: 144 mg/dL — ABNORMAL HIGH (ref 65–99)

## 2015-04-19 LAB — ETHANOL

## 2015-04-19 LAB — MRSA PCR SCREENING: MRSA by PCR: NEGATIVE

## 2015-04-19 LAB — LACTIC ACID, PLASMA: Lactic Acid, Venous: 3.6 mmol/L (ref 0.5–2.0)

## 2015-04-19 LAB — PHOSPHORUS: Phosphorus: 2.4 mg/dL — ABNORMAL LOW (ref 2.5–4.6)

## 2015-04-19 LAB — MAGNESIUM: MAGNESIUM: 2.2 mg/dL (ref 1.7–2.4)

## 2015-04-19 LAB — CK: CK TOTAL: 490 U/L — AB (ref 49–397)

## 2015-04-19 LAB — PROTIME-INR
INR: 1.25 (ref 0.00–1.49)
PROTHROMBIN TIME: 15.9 s — AB (ref 11.6–15.2)

## 2015-04-19 MED ORDER — LORAZEPAM 2 MG/ML IJ SOLN
INTRAMUSCULAR | Status: AC
  Administered 2015-04-19: 2 mg
  Filled 2015-04-19: qty 1

## 2015-04-19 MED ORDER — HEPARIN SODIUM (PORCINE) 5000 UNIT/ML IJ SOLN
5000.0000 [IU] | Freq: Three times a day (TID) | INTRAMUSCULAR | Status: DC
  Administered 2015-04-19 – 2015-04-25 (×20): 5000 [IU] via SUBCUTANEOUS
  Filled 2015-04-19 (×27): qty 1

## 2015-04-19 MED ORDER — LORAZEPAM 2 MG/ML IJ SOLN
INTRAMUSCULAR | Status: AC
  Administered 2015-04-19: 4 mg
  Filled 2015-04-19: qty 1

## 2015-04-19 MED ORDER — SODIUM CHLORIDE 0.9 % IV SOLN
1500.0000 mg | INTRAVENOUS | Status: AC
  Administered 2015-04-19: 1500 mg via INTRAVENOUS
  Filled 2015-04-19: qty 15

## 2015-04-19 MED ORDER — SODIUM CHLORIDE 0.9 % IV BOLUS (SEPSIS)
1000.0000 mL | Freq: Once | INTRAVENOUS | Status: AC
  Administered 2015-04-19: 1000 mL via INTRAVENOUS

## 2015-04-19 MED ORDER — SODIUM CHLORIDE 0.9 % IV SOLN
3.0000 g | Freq: Three times a day (TID) | INTRAVENOUS | Status: AC
  Administered 2015-04-19 – 2015-04-24 (×18): 3 g via INTRAVENOUS
  Filled 2015-04-19 (×19): qty 3

## 2015-04-19 MED ORDER — FENTANYL CITRATE (PF) 100 MCG/2ML IJ SOLN
INTRAMUSCULAR | Status: AC
  Filled 2015-04-19: qty 2

## 2015-04-19 MED ORDER — FENTANYL CITRATE (PF) 100 MCG/2ML IJ SOLN
50.0000 ug | INTRAMUSCULAR | Status: DC | PRN
  Administered 2015-04-20 – 2015-04-21 (×3): 50 ug via INTRAVENOUS
  Filled 2015-04-19 (×4): qty 2

## 2015-04-19 MED ORDER — SODIUM CHLORIDE 0.9 % IV SOLN
INTRAVENOUS | Status: DC
  Administered 2015-04-19: 19:00:00 via INTRAVENOUS

## 2015-04-19 MED ORDER — PROPOFOL 1000 MG/100ML IV EMUL
5.0000 ug/kg/min | Freq: Once | INTRAVENOUS | Status: AC
  Administered 2015-04-19: 10 ug/kg/min via INTRAVENOUS

## 2015-04-19 MED ORDER — PHENYTOIN SODIUM 50 MG/ML IJ SOLN
1000.0000 mg | Freq: Once | INTRAMUSCULAR | Status: AC
  Administered 2015-04-19: 1000 mg via INTRAVENOUS
  Filled 2015-04-19: qty 20

## 2015-04-19 MED ORDER — LORAZEPAM 2 MG/ML IJ SOLN
INTRAMUSCULAR | Status: AC
  Administered 2015-04-19: 1 mg
  Filled 2015-04-19: qty 1

## 2015-04-19 MED ORDER — FENTANYL CITRATE (PF) 100 MCG/2ML IJ SOLN
50.0000 ug | Freq: Once | INTRAMUSCULAR | Status: AC
  Administered 2015-04-19: 50 ug via INTRAVENOUS

## 2015-04-19 MED ORDER — INSULIN ASPART 100 UNIT/ML ~~LOC~~ SOLN
0.0000 [IU] | SUBCUTANEOUS | Status: DC
  Administered 2015-04-19 (×2): 8 [IU] via SUBCUTANEOUS
  Administered 2015-04-19 – 2015-04-20 (×2): 2 [IU] via SUBCUTANEOUS
  Administered 2015-04-20: 5 [IU] via SUBCUTANEOUS
  Administered 2015-04-20 (×3): 3 [IU] via SUBCUTANEOUS
  Administered 2015-04-20: 8 [IU] via SUBCUTANEOUS
  Administered 2015-04-21 (×2): 3 [IU] via SUBCUTANEOUS
  Administered 2015-04-21 (×2): 5 [IU] via SUBCUTANEOUS
  Administered 2015-04-21 (×2): 8 [IU] via SUBCUTANEOUS
  Administered 2015-04-22: 5 [IU] via SUBCUTANEOUS
  Administered 2015-04-22: 2 [IU] via SUBCUTANEOUS

## 2015-04-19 MED ORDER — CHLORHEXIDINE GLUCONATE 0.12 % MT SOLN
15.0000 mL | Freq: Two times a day (BID) | OROMUCOSAL | Status: DC
  Administered 2015-04-19 – 2015-04-26 (×14): 15 mL via OROMUCOSAL
  Filled 2015-04-19 (×15): qty 15

## 2015-04-19 MED ORDER — SUCCINYLCHOLINE CHLORIDE 20 MG/ML IJ SOLN
INTRAMUSCULAR | Status: AC
  Filled 2015-04-19: qty 1

## 2015-04-19 MED ORDER — ACETAMINOPHEN 325 MG PO TABS
650.0000 mg | ORAL_TABLET | ORAL | Status: DC | PRN
  Administered 2015-04-21: 650 mg via ORAL
  Filled 2015-04-19: qty 2

## 2015-04-19 MED ORDER — ROCURONIUM BROMIDE 50 MG/5ML IV SOLN
INTRAVENOUS | Status: AC
  Administered 2015-04-19: 100 mg
  Filled 2015-04-19: qty 2

## 2015-04-19 MED ORDER — LIDOCAINE HCL (CARDIAC) 20 MG/ML IV SOLN
INTRAVENOUS | Status: AC
  Filled 2015-04-19: qty 5

## 2015-04-19 MED ORDER — PROPOFOL 1000 MG/100ML IV EMUL
INTRAVENOUS | Status: AC
  Filled 2015-04-19: qty 100

## 2015-04-19 MED ORDER — SODIUM CHLORIDE 0.9 % IV SOLN
500.0000 mg | Freq: Two times a day (BID) | INTRAVENOUS | Status: DC
  Administered 2015-04-19 – 2015-04-21 (×5): 500 mg via INTRAVENOUS
  Filled 2015-04-19 (×6): qty 5

## 2015-04-19 MED ORDER — PROPOFOL 1000 MG/100ML IV EMUL
0.0000 ug/kg/min | INTRAVENOUS | Status: DC
  Administered 2015-04-19 – 2015-04-20 (×3): 20 ug/kg/min via INTRAVENOUS
  Filled 2015-04-19 (×3): qty 100

## 2015-04-19 MED ORDER — DEXTROSE-NACL 5-0.45 % IV SOLN
INTRAVENOUS | Status: DC
  Administered 2015-04-19: 05:00:00 via INTRAVENOUS

## 2015-04-19 MED ORDER — CETYLPYRIDINIUM CHLORIDE 0.05 % MT LIQD
7.0000 mL | Freq: Four times a day (QID) | OROMUCOSAL | Status: DC
  Administered 2015-04-19 – 2015-04-24 (×17): 7 mL via OROMUCOSAL

## 2015-04-19 MED ORDER — FENTANYL CITRATE (PF) 100 MCG/2ML IJ SOLN: 50.0000 ug | INTRAMUSCULAR | Status: DC | PRN

## 2015-04-19 MED ORDER — SODIUM CHLORIDE 0.9 % IV SOLN: 250.0000 mL | INTRAVENOUS | Status: DC | PRN

## 2015-04-19 MED ORDER — INSULIN ASPART 100 UNIT/ML ~~LOC~~ SOLN
1.0000 [IU] | SUBCUTANEOUS | Status: DC
  Administered 2015-04-19: 3 [IU] via SUBCUTANEOUS

## 2015-04-19 MED ORDER — FAMOTIDINE IN NACL 20-0.9 MG/50ML-% IV SOLN
20.0000 mg | Freq: Two times a day (BID) | INTRAVENOUS | Status: DC
  Administered 2015-04-19 – 2015-04-21 (×6): 20 mg via INTRAVENOUS
  Filled 2015-04-19 (×10): qty 50

## 2015-04-19 MED ORDER — ETOMIDATE 2 MG/ML IV SOLN
INTRAVENOUS | Status: AC
  Filled 2015-04-19: qty 20

## 2015-04-19 NOTE — Progress Notes (Signed)
EEG completed, results pending. 

## 2015-04-19 NOTE — Progress Notes (Signed)
Stat EEG completed, Dr Nicole Kindred aware; results pending.

## 2015-04-19 NOTE — ED Provider Notes (Signed)
CSN: [id number]     Arrival date & time 04/19/15  0041 History  This chart was scribed for  Linton Flemings, MD by Altamease Oiler, ED Scribe. This patient was seen in room [facility] and the patient's care was started at 12:44 AM.   Chief Complaint  Patient presents with  . Altered Mental Status    The history is provided by the EMS personnel. No language interpreter was used.   Dale Morrow is a 74 y.o. male with PMHx of DM and prostate cancer s/p radiation therapy who presents to the Emergency Department complaining of AMS. Pt was found rigid and unresponsive on the floor by his wife around 11:45 PM last night. He was last seen normal around 3:30 PM before his wife went to work.  Per EMS, initial blood glucose was 33 in the field. His insulin pump was removed and an amp of D50 was given with blood glucose improvement (163 on last check). Pt was yelling and making nonsensical vocalizations while being treated by EMS. Normally A&OX3 per wife.   Past Medical History  Diagnosis Date  . Arthritis     left knee  . Depression   . De Quervain's tenosynovitis, left 04/2012  . Tremors of nervous system     takes propranolol for tremors  . History of radiation therapy 3 years ago  . Prostate cancer, primary, with metastasis from prostate to other site     metastasis to lymph nodes  . Complication of anesthesia     pt denies ponv, pt woke up during colonscopy in past  . Diabetes mellitus     Insulin pump, takes lisinopril for kidneys, dr Buddy Duty endocrinologist   Past Surgical History  Procedure Laterality Date  . Hemorrhoid surgery  1970s  . Anterior cruciate ligament repair      right knee  . Wrist arthroscopy  05/03/2010    right; and release of 1st dorsal compartment  . Knee arthrotomy  03/18/2008    left; with scar exc.  [name] Knee arthroscopy  09/25/2007; 04/15/2005    left  . Knee joint manipulation  06/29/2007    left  . Total knee arthroplasty  04/29/2007    left  . Dorsal compartment  release  04/21/2012    Procedure: RELEASE DORSAL COMPARTMENT (DEQUERVAIN);  Surgeon: Cammie Sickle., MD;  Location: [facility];  Service: Orthopedics;  Laterality: Left;  left 1st dorsal compartment release left wrist   . Minor amputation of digit Left 07/21/2013    Procedure: LEFT SMALL REVISION AMPUTATION (MINOR PROCEDURE) ;  Surgeon: Schuyler Amor, MD;  Location: [place];  Service: Orthopedics;  Laterality: Left;  . Colonoscopy with propofol N/A 04/07/2014    Procedure: COLONOSCOPY WITH PROPOFOL;  Surgeon: Jeryl Columbia, MD;  Location: [facility];  Service: Endoscopy;  Laterality: N/A;  . Total knee revision Left 01/25/2015    Procedure: LEFT TOTAL KNEE  ARTHOPLASTY REVISION;  Surgeon: Gaynelle Arabian, MD;  Location: [facility];  Service: Orthopedics;  Laterality: Left;   Family History  Problem Relation Age of Onset  . Heart Problems Father    History  Substance Use Topics  . Smoking status: Former Smoker -- 15 years    Types: Cigarettes    Quit date: 10/13/1979  . Smokeless tobacco: Never Used  . Alcohol Use: No    Review of Systems  Unable to perform ROS: Mental status change      Allergies  Bee venom and Shellfish  allergy  Home Medications   Prior to Admission medications   Medication Sig Start Date End Date Taking? Authorizing Provider  clonazePAM (KLONOPIN) 0.5 MG tablet One tab po bid prn for anxiety, 2 tabs prn at night for insomnia 04/12/15   Marcial Pacas, MD  denosumab (PROLIA) 60 MG/ML SOLN injection Inject 60 mg into the skin every 6 (six) months. Administer in upper arm, thigh, or abdomen    Historical Provider, MD  enoxaparin (LOVENOX) 40 MG/0.4ML injection Inject 0.4 mLs (40 mg total) into the skin daily. Take injections for 8 more days starting Saturday 01/28/2015.  Once completed the Lovenox, then start a 325 mg Aspirin and take twice a day for three more weeks. 01/27/15   Arlee Muslim, PA-C  escitalopram (LEXAPRO) 20 MG tablet  TAKE 1 TABLET BY MOUTH ONCE DAILY Patient taking differently: TAKE 20 MG BY MOUTH ONCE DAILY 04/11/14   Marcial Pacas, MD  Esomeprazole Magnesium (NEXIUM 24HR PO) Take 1 tablet by mouth.    Historical Provider, MD  HYDROcodone-acetaminophen (NORCO/VICODIN) 5-325 MG per tablet Take 1 tablet by mouth every 6 (six) hours as needed for severe pain.  08/12/14   Historical Provider, MD  Insulin Human (INSULIN PUMP) SOLN Inject into the skin. Uses humalog insulin basal rate 1200 am= 1.2, 300 am= 1.2, 600 am=0.85, 500 pm=9.0    Historical Provider, MD  Leuprolide Acetate, 6 Month, (LUPRON) 45 MG injection Inject 45 mg into the muscle every 6 (six) months.    Historical Provider, MD  lisinopril (PRINIVIL,ZESTRIL) 10 MG tablet Take 10 mg by mouth every morning.     Historical Provider, MD  megestrol (MEGACE) 20 MG tablet Take 20 mg by mouth 2 (two) times daily.    Historical Provider, MD  methocarbamol (ROBAXIN) 500 MG tablet Take 1 tablet (500 mg total) by mouth every 6 (six) hours as needed for muscle spasms. 01/27/15   Arlee Muslim, PA-C  polyethylene glycol powder (GLYCOLAX/MIRALAX) powder Take 17 g by mouth at bedtime.     Historical Provider, MD  primidone (MYSOLINE) 50 MG tablet Take 2 tablets (100 mg total) by mouth 3 (three) times daily. 03/28/15   Marcial Pacas, MD  propranolol (INDERAL) 80 MG tablet Take 1 tablet (80 mg total) by mouth 2 (two) times daily. 03/28/15   Marcial Pacas, MD  rosuvastatin (CRESTOR) 10 MG tablet Take 10 mg by mouth every morning.     Historical Provider, MD  tamsulosin (FLOMAX) 0.4 MG CAPS capsule TAKE 1 CAPSULE BY MOUTH ONCE DAILY Patient taking differently: TAKE 0.4 MG BY MOUTH DAILY    Eppie Gibson, MD  traZODone (DESYREL) 50 MG tablet Take 1 tablet (50 mg total) by mouth at bedtime. 03/28/15   Marcial Pacas, MD   Wt 188 lb 0.8 oz (85.3 kg) Physical Exam  Constitutional: He appears well-developed and well-nourished. He appears distressed.  Elderly male, unresponsive, aside from  occasional moans.  Patient with flexion of left arm resting on cheek, right arm extended, bilateral lower extremities and feet held in rigid extension  HENT:  Head: Normocephalic and atraumatic.  Right Ear: External ear normal.  Left Ear: External ear normal.  Nose: Nose normal.  Mouth/Throat: Oropharynx is clear and moist.  Eyes: Conjunctivae are normal. Pupils are equal, round, and reactive to light.  Neck: Normal range of motion. Neck supple. No JVD present. No tracheal deviation present. No thyromegaly present.  Cardiovascular: Regular rhythm, normal heart sounds and intact distal pulses.  Exam reveals no gallop and no friction  rub.   No murmur heard. Tachycardia  Pulmonary/Chest: Effort normal and breath sounds normal. No stridor. No respiratory distress. He has no wheezes. He has no rales. He exhibits no tenderness.  Abdominal: Soft. Bowel sounds are normal. He exhibits no distension and no mass. There is no tenderness. There is no rebound and no guarding.  Musculoskeletal: He exhibits no edema or tenderness.  Lymphadenopathy:    He has no cervical adenopathy.  Neurological: He displays abnormal reflex. He exhibits abnormal muscle tone. Coordination abnormal.  Unable to assess cranial nerves secondary to altered mental status.  Patient with abnormal tone, posturing,  noted above  Skin: No rash noted. He is diaphoretic. No erythema. No pallor.  Cool to touch  Nursing note and vitals reviewed.   ED Course  Procedures  INTUBATION Performed by: Linton Flemings, MD  Required items: required blood products, implants, devices, and special equipment available Patient identity confirmed: provided demographic data and hospital-assigned identification number Time out: Immediately prior to procedure a "time out" was called to verify the correct patient, procedure, equipment, support staff and site/side marked as required.  Indications: altered mental status, airway protection  Intubation  method: Glidescope Laryngoscopy   Preoxygenation: BVM  Sedatives: Ativan Paralytic: rocuronium  Tube Size: 7.0 cuffed  Post-procedure assessment: chest rise and ETCO2 monitor Breath sounds: equal and absent over the epigastrium Tube secured with: ETT holder Chest x-ray interpreted by radiologist and me.  Chest x-ray findings: endotracheal tube in appropriate position  Patient tolerated the procedure well with no immediate complications.    COORDINATION OF CARE: 12:49 AM Treatment plan includes CT head and cervical spine, EKG, CXR, and lab work .  12:58 AM Consult complete with Dr. Leonel Ramsay (neuro). Patient case explained and discussed. Call ended at 12:59 AM.   1:53 AM-Consult complete with Dr. Corinna Lines ([place]). Patient case explained and discussed. Dr. Corinna Lines agrees to admit patient for further evaluation and treatment. Call ended at 1:59 AM  2:22 AM I re-evaluated the patient. Critical Care team is at the bedside. I provided an update on the patient's condition and the  treatment plan to his wife.   Labs Review Labs Reviewed  CBC - Abnormal; Notable for the following:    WBC 11.8 (*)    Hemoglobin 11.9 (*)    HCT 37.1 (*)    RDW 15.6 (*)    All other components within normal limits  DIFFERENTIAL - Abnormal; Notable for the following:    Neutrophils Relative % 87 (*)    Neutro Abs 10.2 (*)    Lymphocytes Relative 8 (*)    All other components within normal limits  COMPREHENSIVE METABOLIC PANEL - Abnormal; Notable for the following:    CO2 15 (*)    Glucose, Bld 185 (*)    BUN 22 (*)    Calcium 8.6 (*)    Albumin 3.2 (*)    AST 50 (*)    Anion gap 18 (*)    All other components within normal limits  I-STAT CHEM 8, ED - Abnormal; Notable for the following:    BUN 27 (*)    Glucose, Bld 185 (*)    All other components within normal limits  I-STAT TROPOININ, ED - Abnormal; Notable for the following:    Troponin i, poc 1.22 (*)    All other components  within normal limits  I-STAT CG4 LACTIC ACID, ED - Abnormal; Notable for the following:    Lactic Acid, Venous 10.20 (*)    All  other components within normal limits  ETHANOL  PROTIME-INR  APTT  URINE RAPID DRUG SCREEN, HOSP PERFORMED  URINALYSIS, ROUTINE W REFLEX MICROSCOPIC (NOT AT [facility])  CK  DIPHTHERIA / TETANUS ANTIBODY PANEL  BLOOD GAS, ARTERIAL    Imaging Review Ct Head Wo Contrast  04/19/2015   CLINICAL DATA:  Found down and unresponsive.  EXAM: CT HEAD WITHOUT CONTRAST  CT CERVICAL SPINE WITHOUT CONTRAST  TECHNIQUE: Multidetector CT imaging of the head and cervical spine was performed following the standard protocol without intravenous contrast. Multiplanar CT image reconstructions of the cervical spine were also generated.  COMPARISON:  None.  FINDINGS: CT HEAD FINDINGS  Skull and Sinuses:Negative for fracture or destructive process. The mastoids, middle ears, and imaged paranasal sinuses are clear.  Orbits: Bilateral cataract resection.  Brain: No evidence of acute infarction, hemorrhage, hydrocephalus, or mass lesion/mass effect. Generalized cerebral volume loss.  CT CERVICAL SPINE FINDINGS  Negative for acute fracture or subluxation. No prevertebral edema. No gross cervical canal hematoma. Multilevel degenerative disc narrowing and facet arthropathy. No significant osseous canal or foraminal stenosis. No endplate erosion or focal bone lesion.  Interlobular septal thickening at the apices.  IMPRESSION: 1. No acute intracranial or cervical spine findings. 2. Pulmonary edema.   Electronically Signed   By: Monte Fantasia M.D.   On: 04/19/2015 01:22   Ct Cervical Spine Wo Contrast  04/19/2015   CLINICAL DATA:  Found down and unresponsive.  EXAM: CT HEAD WITHOUT CONTRAST  CT CERVICAL SPINE WITHOUT CONTRAST  TECHNIQUE: Multidetector CT imaging of the head and cervical spine was performed following the standard protocol without intravenous contrast. Multiplanar CT image reconstructions of the  cervical spine were also generated.  COMPARISON:  None.  FINDINGS: CT HEAD FINDINGS  Skull and Sinuses:Negative for fracture or destructive process. The mastoids, middle ears, and imaged paranasal sinuses are clear.  Orbits: Bilateral cataract resection.  Brain: No evidence of acute infarction, hemorrhage, hydrocephalus, or mass lesion/mass effect. Generalized cerebral volume loss.  CT CERVICAL SPINE FINDINGS  Negative for acute fracture or subluxation. No prevertebral edema. No gross cervical canal hematoma. Multilevel degenerative disc narrowing and facet arthropathy. No significant osseous canal or foraminal stenosis. No endplate erosion or focal bone lesion.  Interlobular septal thickening at the apices.  IMPRESSION: 1. No acute intracranial or cervical spine findings. 2. Pulmonary edema.   Electronically Signed   By: Monte Fantasia M.D.   On: 04/19/2015 01:22     EKG Interpretation   Date/Time:  Wednesday April 19 2015 01:49:36 EDT Ventricular Rate:  115 PR Interval:  144 QRS Duration: 91 QT Interval:  365 QTC Calculation: 505 R Axis:   71 Text Interpretation:  Sinus tachycardia Borderline low voltage, extremity  leads Prolonged QT interval Confirmed by Nijee Heatwole  MD, Shanekqua Schaper ([id number]) on  04/19/2015 2:00:27 AM      CRITICAL CARE Performed by: Kalman Drape Total critical care time: 60 min Critical care time was exclusive of separately billable procedures and treating other patients. Critical care was necessary to treat or prevent imminent or life-threatening deterioration. Critical care was time spent personally by me on the following activities: development of treatment plan with patient and/or surrogate as well as nursing, discussions with consultants, evaluation of patient's response to treatment, examination of patient, obtaining history from patient or surrogate, ordering and performing treatments and interventions, ordering and review of laboratory studies, ordering and review of radiographic  studies, pulse oximetry and re-evaluation of patient's condition.  MDM   Final diagnoses:  Altered consciousness  Status epilepticus    I personally performed the services described in this documentation, which was scribed in my presence. The recorded information has been reviewed and is accurate.  74 year old male who presents with altered mental status, rigid posturing.  CT scan without findings.  Dr. Leonel Ramsay consulted, saw patient in CT suite.  Pt with persistent posturing, concern for seizure, but no response to 10 mg of Ativan.  Patient was intubated for airway control.  Patient was started on Keppra, propofol.  Critical care was consult it.  Elevated lactate noted.  Assumed to be secondary to his tonic activity.  Wife updated on findings and current plan.     Linton Flemings, MD 04/19/15 [phone]

## 2015-04-19 NOTE — Procedures (Signed)
EEG report.  Brief clinical history: 74 y.o. male who was found unresponsive by his wife at home. On arrival, his BG was 33, but did not improve with D50. In the ER, there ewas concern for seizure and ativan was given with no response. He continued to have markedly increased tone, L > R with intermittent figure of four posturing.  A routine EEG completed few hours ago did not reveal evidence of electrographic seizures and was consistent with a non specific encephalopathy.  Technique: this is a 17 channel routine scalp EEG performed at the bedside with bipolar and monopolar montages arranged in accordance to the international 10/20 system of electrode placement. One channel was dedicated to EKG recording.  Patient is sedated with propofol but remains experiencing intermittent hyperkinetic movements. No activating procedures performed.  Description: as the study begins and throughout the entire recording, there is diffuse, continuous, disorganized theta activity with superimposed faster frequencies that seem to have a morphology consistent with normal sleep morphology or perhaps related to sedatives. No evidence of electrographic seizures or epileptiform discharges. Patient was restless despite sedation and exhibited intermittent body stiffening mainly in the left side but they were not associated with concomitant electrographic abnormality. Stage 2 sleep showed symmetric and synchronous sleep spindles without intermixed epileptiform discharges. Hyperventilation induced mild, diffuse, physiologic slowing but no epileptiform discharges. Intermittent photic stimulation did induce a normal driving response.  No focal or generalized epileptiform discharges noted.  No pathologic areas of slowing seen.  EKG showed sinus rhythm. Please, be aware that a normal EEG does not exclude the possibility of epilepsy.  Clinical correlation is advised.   Dorian Pod, MD [facility]

## 2015-04-19 NOTE — Progress Notes (Signed)
Walked in room to find patient with left arm held in the air and right arm held against body in rigid extension.  Legs had mild increased tone and no jerking was noted. No eye deviation and no incontinence. HE is on Propofol and no response to pain was noted. This lasted for about 5 minutes.  He had just received 500 mg Keppra and has received a total of 1500 mg Keppra and 1000 mg dilantin earlier in AM. BP was 97/56 thus no Ativan was given. Unclear if this is seizure activity given previous EEG this AM did not show seizure activity.  Will obtain EEG to further evaluate if this is seizure activity.    Etta Quill PA-C [facility] [phone]  04/19/2015, 10:04 AM

## 2015-04-19 NOTE — ED Notes (Signed)
Patient rigid, posturing, one arm above head, one at side.  All muscles remain rigid.

## 2015-04-19 NOTE — ED Notes (Signed)
Returned from CT.

## 2015-04-19 NOTE — ED Notes (Signed)
Patient las seen normal by wife at 17.  Patient was found by wife after returning from work at [employer], on floor, unresponsive, rigid body, mumbling. CBG found to be 33, D50 given.  Sugar was 262, now 162.  Insulin pump taken off at home by EMS.

## 2015-04-19 NOTE — Progress Notes (Signed)
PULMONARY / CRITICAL CARE MEDICINE   Name: Dale Morrow MRN: [id number] DOB: 26-Mar-1941    ADMISSION DATE:  04/19/2015  CHIEF COMPLAINT:  AMS  INITIAL PRESENTATION:  74yo male PMH of DM and prostate CA presented to the ED after being found unresponsive by his wife. Last seen normal around 3:30pm. Initial CGB of 33. Glucose responded well to D50. Rigid and posturing noted. Agitated and moaning in ED. CT Head obtained -- clear. Neurology consulted. Intubated in ED  STUDIES:  CT Head 7/6 - no acute changes CXR 7/6 - opacification of Rt chest; pulm edema  SIGNIFICANT EVENTS: Intubation in ED 7/6 >>  SUBJECTIVE:  Followup from admission earlier this morning. Patient remains intubated. Nurse was at bedside when examined. No acute issues. Appears comfortable. No family at bedside to update.  VITAL SIGNS: Temp:  [97.1 F (36.2 C)-99.4 F (37.4 C)] 99.4 F (37.4 C) (07/06 0742) Pulse Rate:  [85-115] 107 (07/06 0900) Resp:  [14-33] 22 (07/06 0900) BP: (68-210)/(47-187) 102/49 mmHg (07/06 0900) SpO2:  [89 %-100 %] 100 % (07/06 0900) FiO2 (%):  [40 %-100 %] 40 % (07/06 0900) Weight:  [85.3 kg (188 lb 0.8 oz)] 85.3 kg (188 lb 0.8 oz) (07/06 0158) HEMODYNAMICS:   VENTILATOR SETTINGS: Vent Mode:  [-] PRVC FiO2 (%):  [40 %-100 %] 40 % Set Rate:  [16 bmp-20 bmp] 20 bmp Vt Set:  [500 mL] 500 mL PEEP:  [5 cmH20] 5 cmH20 Plateau Pressure:  [12 cmH20-19 cmH20] 12 cmH20 INTAKE / OUTPUT:  Intake/Output Summary (Last 24 hours) at 04/19/15 1013 Last data filed at 04/19/15 0900  Gross per 24 hour  Intake 2439.14 ml  Output    300 ml  Net 2139.14 ml    PHYSICAL EXAMINATION: General -- Sedated and intubated on ventilator. No distress. HEENT -- No oral ulcers. No scleral injection or icterus. Endotracheal tube in place. Integument -- Warm & dry. No rash on exposed skin. Chest -- Good aeration & clear to auscultation bilaterally on ventilator. Cardiac -- Regular rhythm. No edema.    Abdomen -- Normoactive bowel sounds. Soft. Nondistended.  CNS -- Sedated. Not following commands on Propofol. PERRL.  LABS:  CBC  Recent Labs Lab 04/19/15 0051 04/19/15 0059 04/19/15 0430  WBC 11.8*  --  11.2*  HGB 11.9* 13.6 11.7*  HCT 37.1* 40.0 36.0*  PLT 307  --  260   Coag's  Recent Labs Lab 04/19/15 0051  APTT 30  INR 1.25   BMET  Recent Labs Lab 04/19/15 0051 04/19/15 0059 04/19/15 0130 04/19/15 0430  NA 137 137  --  137  K 4.6 4.5  --  5.1  CL 104 102  --  104  CO2 15*  --   --  21*  BUN 22* 27*  --  20  CREATININE 1.11 0.80 1.01 0.85  GLUCOSE 185* 185*  --  183*   Electrolytes  Recent Labs Lab 04/19/15 0051 04/19/15 0430  CALCIUM 8.6* 8.3*  MG  --  2.2  PHOS  --  2.4*   Sepsis Markers  Recent Labs Lab 04/19/15 0059 04/19/15 0430  LATICACIDVEN 10.20* 3.6*  PROCALCITON  --  <0.10   ABG  Recent Labs Lab 04/19/15 0234 04/19/15 0350  PHART 7.286* 7.378  PCO2ART 44.9 31.6*  PO2ART 241.0* 379*   Liver Enzymes  Recent Labs Lab 04/19/15 0051  AST 50*  ALT 17  ALKPHOS 71  BILITOT 0.4  ALBUMIN 3.2*   Cardiac Enzymes No results for input(s):  TROPONINI, PROBNP in the last 168 hours. Glucose  Recent Labs Lab 04/19/15 0221 04/19/15 0420 04/19/15 0741  GLUCAP 144* 161* 239*    Imaging Ct Head Wo Contrast  04/19/2015   CLINICAL DATA:  Found down and unresponsive.  EXAM: CT HEAD WITHOUT CONTRAST  CT CERVICAL SPINE WITHOUT CONTRAST  TECHNIQUE: Multidetector CT imaging of the head and cervical spine was performed following the standard protocol without intravenous contrast. Multiplanar CT image reconstructions of the cervical spine were also generated.  COMPARISON:  None.  FINDINGS: CT HEAD FINDINGS  Skull and Sinuses:Negative for fracture or destructive process. The mastoids, middle ears, and imaged paranasal sinuses are clear.  Orbits: Bilateral cataract resection.  Brain: No evidence of acute infarction, hemorrhage,  hydrocephalus, or mass lesion/mass effect. Generalized cerebral volume loss.  CT CERVICAL SPINE FINDINGS  Negative for acute fracture or subluxation. No prevertebral edema. No gross cervical canal hematoma. Multilevel degenerative disc narrowing and facet arthropathy. No significant osseous canal or foraminal stenosis. No endplate erosion or focal bone lesion.  Interlobular septal thickening at the apices.  IMPRESSION: 1. No acute intracranial or cervical spine findings. 2. Pulmonary edema.   Electronically Signed   By: Monte Fantasia M.D.   On: 04/19/2015 01:22   Ct Cervical Spine Wo Contrast  04/19/2015   CLINICAL DATA:  Found down and unresponsive.  EXAM: CT HEAD WITHOUT CONTRAST  CT CERVICAL SPINE WITHOUT CONTRAST  TECHNIQUE: Multidetector CT imaging of the head and cervical spine was performed following the standard protocol without intravenous contrast. Multiplanar CT image reconstructions of the cervical spine were also generated.  COMPARISON:  None.  FINDINGS: CT HEAD FINDINGS  Skull and Sinuses:Negative for fracture or destructive process. The mastoids, middle ears, and imaged paranasal sinuses are clear.  Orbits: Bilateral cataract resection.  Brain: No evidence of acute infarction, hemorrhage, hydrocephalus, or mass lesion/mass effect. Generalized cerebral volume loss.  CT CERVICAL SPINE FINDINGS  Negative for acute fracture or subluxation. No prevertebral edema. No gross cervical canal hematoma. Multilevel degenerative disc narrowing and facet arthropathy. No significant osseous canal or foraminal stenosis. No endplate erosion or focal bone lesion.  Interlobular septal thickening at the apices.  IMPRESSION: 1. No acute intracranial or cervical spine findings. 2. Pulmonary edema.   Electronically Signed   By: Monte Fantasia M.D.   On: 04/19/2015 01:22   Dg Chest Port 1 View  04/19/2015   CLINICAL DATA:  Endotracheal tube placement  EXAM: PORTABLE CHEST - 1 VIEW  COMPARISON:  03/13/1999  FINDINGS:  Endotracheal tube with tip between the clavicular heads and carina. The orogastric tube reaches the stomach at least.  There is interlobular septal thickening and Kerley lines consistent with pulmonary edema, also seen on the contemporaneous cervical spine CT. Opacity is asymmetric to the right. Normal heart size and mediastinal contours.  IMPRESSION: 1. Endotracheal and orogastric tubes are in good position. 2. Pulmonary edema. 3. Asymmetric opacification of the right chest could be from asymmetric edema or superimposed pneumonia/aspiration.   Electronically Signed   By: Monte Fantasia M.D.   On: 04/19/2015 01:59     ASSESSMENT / PLAN:  PULMONARY Intubation 7/6 >> A:  Acute Respiratory Failure Aspiration Pneumonia vs. Pneumonitis  P:   Full vent support Vent bundle CXR QD ABG QD   CARDIOVASCULAR A:  Sinus Tachycardia - resolved HTN to 160V systolic - resolved Hypotension - resolved Elevated troponin - likely demand ischemia  P:  Telemetry monitoring Vitals per unit protocol Checking Echocardiogram  RENAL A:  AG metabolic acidosis  Lactic acidosis Elevated CK   P:   Trending electrolytes daily Repeat Lactate & CK in AM  GASTROINTESTINAL A:   NPO No active issues  P:   Monitor I/O Stress ulcer prophylaxis - Pepcid IV q12hr  HEMATOLOGIC A:   No active issues  P:  Heparin Brus[place] q8hr Daily CBC  INFECTIOUS A:   Aspiration Pneumonia vs. Pneumonitis Recent total knee arthroplasty  P:   Abx: Unasyn, start date 7/6 >> Monitor knee for signs of infection  ENDOCRINE A:   Hypoglycemia H/O DM Type 1  P:   SSI w/ accuchecks q4hr  NEUROLOGIC A:   Hypoglycemic Encephalopathy Seizure  P:   Neurology follwing S/P ativan (10mg  total) Keppra q12hr Repeat EEG pending RASS goal: -1 Plan for SBT once ok with Neuro   FAMILY  - Inter-disciplinary family meet or Palliative Care meeting due by:  04/27/15   Sonia Baller. Ashok Cordia, M.D. [facility]

## 2015-04-19 NOTE — Progress Notes (Signed)
ANTIBIOTIC CONSULT NOTE - INITIAL  Pharmacy Consult for Unasyn  Indication: Aspiration PNA  Allergies  Allergen Reactions  . Bee Venom Anaphylaxis  . Shellfish Allergy Anaphylaxis    Patient Measurements: Weight: 188 lb 0.8 oz (85.3 kg) (from June 2016)  Vital Signs: Temp: 97.1 F (36.2 C) (07/06 0050) Temp Source: Temporal (07/06 0050) BP: 121/73 mmHg (07/06 0230) Pulse Rate: 105 (07/06 0230) Intake/Output from previous day: 07/05 0701 - 07/06 0700 In: 1000 [I.V.:1000] Out: -  Intake/Output from this shift: Total I/O In: 1000 [I.V.:1000] Out: -   Labs:  Recent Labs  04/19/15 0051 04/19/15 0059  WBC 11.8*  --   HGB 11.9* 13.6  PLT 307  --   CREATININE 1.11 0.80   Estimated Creatinine Clearance: 84.2 mL/min (by C-G formula based on Cr of 0.8).  Medical History: Past Medical History  Diagnosis Date  . Arthritis     left knee  . Depression   . De Quervain's tenosynovitis, left 04/2012  . Tremors of nervous system     takes propranolol for tremors  . History of radiation therapy 3 years ago  . Prostate cancer, primary, with metastasis from prostate to other site     metastasis to lymph nodes  . Complication of anesthesia     pt denies ponv, pt woke up during colonscopy in past  . Diabetes mellitus     Insulin pump, takes lisinopril for kidneys, dr Buddy Duty endocrinologist    Assessment: 74 y/o M found unresponsive earlier tonight, starting anti-biotics for possible aspiration PNA, WBC 11.8, renal function ok, other labs as above.   Plan:  -Unasyn 3g IV q8h -Trend WBC, temp, renal function   Narda Bonds 04/19/2015,2:48 AM

## 2015-04-19 NOTE — ED Notes (Signed)
Eyes noted to be deviated to the right while in CT

## 2015-04-19 NOTE — Progress Notes (Signed)
  Echocardiogram 2D Echocardiogram has been performed.  Darlina Sicilian M 04/19/2015, 2:54 PM

## 2015-04-19 NOTE — Care Management Note (Signed)
Case Management Note  Patient Details  Name: Dale Morrow MRN: [id number] Date of Birth: 02-26-1941  Subjective/Objective:     Pt admitted on 04/19/15 with AMS, ? Seizure.  PTA, pt independent, lives with spouse.                Action/Plan: Will follow for discharge planning as pt progresses.  Currently remains sedated and on vent.    Expected Discharge Date:                  Expected Discharge Plan:  [place]  In-House Referral:     Discharge planning Services  CM Consult  Post Acute Care Choice:    Choice offered to:     DME Arranged:    DME Agency:     HH Arranged:    [place] Agency:     Status of Service:  In process, will continue to follow  Medicare Important Message Given:    Date Medicare IM Given:    Medicare IM give by:    Date Additional Medicare IM Given:    Additional Medicare Important Message give by:     If discussed at [place] of Stay Meetings, dates discussed:    Additional Comments:  Reinaldo Raddle, RN, BSN  Trauma/Neuro ICU Case Manager [phone]

## 2015-04-19 NOTE — Progress Notes (Addendum)
Inpatient Diabetes Program Recommendations  AACE/ADA: New Consensus Statement on Inpatient Glycemic Control (2013)  Target Ranges:  Prepandial:   less than 140 mg/dL      Peak postprandial:   less than 180 mg/dL (1-2 hours)      Critically ill patients:  140 - 180 mg/dL    Results for Dale Morrow, Dale Morrow (MRN [id number]) as of 04/19/2015 11:31  Ref. Range 04/19/2015 02:21 04/19/2015 04:20 04/19/2015 07:41  Glucose-Capillary Latest Ref Range: 65-99 mg/dL 144 (H) 161 (H) 239 (H)     Admit with: AMS/ Seizures/ Hypoglycemia/ Questionable Pneumonia  History: DM, Prostate Cancer  Home DM Meds: Insulin Pump (see below for settings)  Current DM Orders: Novolog Moderate SSI (0-15 units) Q4 hours    -Through Chart Review, found that patient met with Harvie Bridge, CDE with the [facility] DM Management center on 01/18/15 for insulin pump adjustments (see below for settings).  Patient sees Dr. Buddy Duty for DM and Insulin pump management.  -Pump Settings are as follows:  Basal Rates: 12AM- 1.35 units/hr 3AM- 1.2 units/hr 6AM- 0.85 units/hr 5PM- 1.00 units/hr  Total Basal Insulin per 24 hour period= 24 units  Carbohydrate Ratio: MN- 1 unit for every 10 grams of carbohydrates consumed 11AM- 1 unit for every 5 grams of carbohydrates consumed  Correction/Sensitivity Factor: MN- 1 unit for every 20 mg/dl above target CBG 11AM- 1 unit for every 30 mg/dl above target CBG    MD- Please consider adding at least 50% of patient's basal insulin dose to current hospital insulin regimen-  Lantus 12 units QHS   Addendum 1446: Spoke with RN Kim caring for pt today.  Asked RN to please call MD to see if they want to start basal insulin (Lantus or Levemir) for this patient tonight.    Will follow Wyn Quaker RN, MSN, CDE Diabetes Coordinator Inpatient Glycemic Control Team Team Pager: [phone] (8a-5p)

## 2015-04-19 NOTE — Procedures (Signed)
History: 74 yo M with likely status epilepticus  Sedation: Propofol  Technique: This is a 19 channel routine scalp EEG performed at the bedside with bipolar and monopolar montages arranged in accordance to the international 10/20 system of electrode placement. One channel was dedicated to EKG recording.    Background: The background consists predominantly of generalized irregular slow activity in teh delta range. There are runs of faster activity at times consistent with sleep structures. With stimulation, there is an increase in faster activity, but no clear PDR is seen.   Photic stimulation: Physiologic driving is not performed  EEG Abnormalities: 1) Generalized irregular slow activity.  2) Absent PDR  Clinical Interpretation: This EEG is consistent with a generalized non-specific cerebral dysfunction(encephalopathy). There was no seizure or seizure predisposition recorded on this study.   Roland Rack, MD [facility] [phone]  If 7pm- 7am, please page neurology on call as listed in [place].

## 2015-04-19 NOTE — Procedures (Addendum)
ELECTROENCEPHALOGRAM REPORT  Patient: Dale Morrow       Room #: 6K08 EEG No. ID: [id number] Age: 74 y.o.        Sex: male Referring Physician: Elsworth Soho, R Report Date:  04/19/2015        Interpreting Physician: Anthony Sar  History: Dale Morrow is an 74 y.o. male history of diabetes mellitus and prostate cancer to the emergency room unresponsive and hypoglycemic blood sugar of 33. He was noted to be rigid with possible seizure-like activity. He was given a total of 10 mg of Ativan followed by loading doses of Dilantin and and Keppra. This activity had stopped by the time an EEG was obtained and no seizure activity was seen on EEG, only nonspecific generalized slowing recorded. She had a recurrence of stiffness with intermittent raising and extension of upper extremities as well as head turning to the right.  Indications for study:  Rule out seizure activity.  Technique: This is an 18 channel routine scalp EEG performed at the bedside with bipolar and monopolar montages arranged in accordance to the international 10/20 system of electrode placement.   Description: Patient was intubated and on mechanical ventilation at the time of this study. The dominant activity consisted of low to moderate amplitude diffuse mixed delta and theta activity which was symmetrical. No amplitude superimposed beta activity was recorded from the central head regions. Photic stimulation was not performed. Epileptiform discharges were recorded, including numerous episodes of raising and extension of upper extremities rigidly, as well as episodes of head turning to the right side.  Interpretation: This EEG study was abnormal with a continuous generalized moderately severe nonspecific slowing of cerebral activity, as seen on previous study. No evidence of seizure activity was recorded, including numerous spells of motor activity during the study.   Rush Farmer M.D. [facility] [phone]

## 2015-04-19 NOTE — ED Notes (Addendum)
To CT with RN, EMT, Dr Sharol Given and Dr Leonel Ramsay.

## 2015-04-19 NOTE — Consult Note (Signed)
Neurology Consultation Reason for Consult: Abnormal Movements Referring Physician: Margaretha Seeds  CC: abnormal movements, unresponisveness.   History is obtained from: EMS, medical record  HPI: Dale Morrow is a 74 y.o. male who was found unresponsive by his wife at approximately 11:45 pm. She last saw him around 3:30 and found him on the floor.   On arrival, his BG was 33, but did not improve with D50. In the ER, there ewas cocnern for seizure and ativan was given with no response. He continued to have markedly increased tone, L > R with intermittent figure of four posturing.   Further history limited due to MS.   ROS: Unable to obtain due to altered mental status.   Past Medical History  Diagnosis Date  . Arthritis     left knee  . Depression   . De Quervain's tenosynovitis, left 04/2012  . Tremors of nervous system     takes propranolol for tremors  . History of radiation therapy 3 years ago  . Prostate cancer, primary, with metastasis from prostate to other site     metastasis to lymph nodes  . Complication of anesthesia     pt denies ponv, pt woke up during colonscopy in past  . Diabetes mellitus     Insulin pump, takes lisinopril for kidneys, dr Buddy Duty endocrinologist    Family History: Unable to assess secondary to patient's altered mental status.    Social History: Tob: Unable to assess secondary to patient's altered mental status.    Exam: Current vital signs: There were no vitals filed for this visit. Vital signs in last 24 hours:   Physical Exam  Constitutional: Appears well-developed and well-nourished.  Psych: Affect appropriate to situation Eyes: No scleral injection HENT: No OP obstrucion Head: Normocephalic.  Cardiovascular: tachycardic Respiratory: Effort normal  GI: Soft.  No distension. There is no tenderness.  Skin: Erythema of the left cheek from where his hand has been keeping pressure  Neuro: Mental Status: Patient has eyes open, but does  not follow commands. He does not fixate or track. He does not engage the examiner.  Cranial Nerves: II: Does nto blink to threat Pupils are equal, round, and reactive to light.   III,IV, VI: Eyes are either midline or to the right, he doe snot cross midline to the left, C-collar in place.  V: blinks to eyelid stim bilaterally VII: Increased tone throughout the left face(h/o hemifacial spasm) VIII, X, XI, XII: Unable to assess secondary to patient's altered mental status.  Motor: Tone is increased throughout, but much more so on the left than right. Bulk is normal. He does have spontaneous movement sof the right arm and leg, and does appear to wiggle his toes on the left as well(not to command). His left arm is held flexed athe the elbow, with his hand pressed against his left cheek.  Sensory: repsonds to nox stim x 4.  Cerebellar: Unable to assess secondary to patient's altered mental status.    I have reviewed labs in epic and the results pertinent to this consultation are: Lactate 10  I have reviewed the images obtained: CT head - no hemorrhage  Impression: 74 yo M with what I suspect is complex partial status epilepticus. His movements are unusual, but I feel that it represents figure of four posturing with variable involvement of the right arm and leg, but always involving the left. Other possibilities including serotonin syndrome, tetanus, etc I feel are less likely and would favor treating as  status at this time.   Recommendations: 1) Ativan(total 10mg  given) 2) Keppra 1500mg   3) STAT EEG to confirm diagnosis.    This patient is critically ill and at significant risk of neurological worsening, death and care requires constant monitoring of vital signs, hemodynamics,respiratory and cardiac monitoring, neurological assessment, discussion with family, other specialists and medical decision making of high complexity. I spent 60 minutes of neurocritical care time  in the care of  this  patient.  Roland Rack, MD [facility] [phone]  If 7pm- 7am, please page neurology on call as listed in [place]. 04/19/2015  1:51 AM

## 2015-04-19 NOTE — H&P (Signed)
PULMONARY / CRITICAL CARE MEDICINE   Name: Dale Morrow MRN: [id number] DOB: 10-12-1941    ADMISSION DATE:  04/19/2015  CHIEF COMPLAINT:  AMS  INITIAL PRESENTATION: 74yo male PMH of DM and prostate CA presented to the ED after being found unresponsive by his wife. Last seen normal around 3:30pm. Initial CGB of 33. Glucose responded well to D50. Rigid and posturing noted. Agitated and moaning in ED. CT Head obtained -- clear. Neurology consulted. Intubated in ED  STUDIES:  CT Head 7/6 - no acute changes CXR 7/6 - opacification of Rt chest; pulm edema  SIGNIFICANT EVENTS: Intubation in ED 7/6 >>   HISTORY OF PRESENT ILLNESS:  74yo male PMH of DM and prostate CA (s/p radiation therapy) presented to the ED after being found unresponsive by his wife around 11:45pm 7/5. Last seen normal around 3:30pm. Initial CGB of 33 by EMS. Noted to be rigid in the field. Glucose responded well to D50. Patient was agitated and moaning in ED. CT Head obtained. Neurology consulted. Possible posturing was present. A total of 10mg  Ativan and 100mg  rocuronium provided prior to intubation. Wife reports a productive cough w/ clear sputum for the past 2 days. Patient admitted to ICU.  PAST MEDICAL HISTORY :   has a past medical history of Arthritis; Depression; De Quervain's tenosynovitis, left (04/2012); Tremors of nervous system; History of radiation therapy (3 years ago); Prostate cancer, primary, with metastasis from prostate to other site; Complication of anesthesia; and Diabetes mellitus.  has past surgical history that includes Hemorrhoid surgery (1970s); Anterior cruciate ligament repair; Wrist arthroscopy (05/03/2010); Knee arthrotomy (03/18/2008); Knee arthroscopy (09/25/2007; 04/15/2005); Knee joint manipulation (06/29/2007); Total knee arthroplasty (04/29/2007); Dorsal compartment release (04/21/2012); Minor amputation of digit (Left, 07/21/2013); Colonoscopy with propofol (N/A, 04/07/2014); and Total knee revision  (Left, 01/25/2015). Prior to Admission medications   Medication Sig Start Date End Date Taking? Authorizing Provider  clonazePAM (KLONOPIN) 0.5 MG tablet One tab po bid prn for anxiety, 2 tabs prn at night for insomnia 04/12/15   Marcial Pacas, MD  denosumab (PROLIA) 60 MG/ML SOLN injection Inject 60 mg into the skin every 6 (six) months. Administer in upper arm, thigh, or abdomen    Historical Provider, MD  enoxaparin (LOVENOX) 40 MG/0.4ML injection Inject 0.4 mLs (40 mg total) into the skin daily. Take injections for 8 more days starting Saturday 01/28/2015.  Once completed the Lovenox, then start a 325 mg Aspirin and take twice a day for three more weeks. 01/27/15   Arlee Muslim, PA-C  escitalopram (LEXAPRO) 20 MG tablet TAKE 1 TABLET BY MOUTH ONCE DAILY Patient taking differently: TAKE 20 MG BY MOUTH ONCE DAILY 04/11/14   Marcial Pacas, MD  Esomeprazole Magnesium (NEXIUM 24HR PO) Take 1 tablet by mouth.    Historical Provider, MD  HYDROcodone-acetaminophen (NORCO/VICODIN) 5-325 MG per tablet Take 1 tablet by mouth every 6 (six) hours as needed for severe pain.  08/12/14   Historical Provider, MD  Insulin Human (INSULIN PUMP) SOLN Inject into the skin. Uses humalog insulin basal rate 1200 am= 1.2, 300 am= 1.2, 600 am=0.85, 500 pm=9.0    Historical Provider, MD  Leuprolide Acetate, 6 Month, (LUPRON) 45 MG injection Inject 45 mg into the muscle every 6 (six) months.    Historical Provider, MD  lisinopril (PRINIVIL,ZESTRIL) 10 MG tablet Take 10 mg by mouth every morning.     Historical Provider, MD  megestrol (MEGACE) 20 MG tablet Take 20 mg by mouth 2 (two) times daily.  Historical Provider, MD  methocarbamol (ROBAXIN) 500 MG tablet Take 1 tablet (500 mg total) by mouth every 6 (six) hours as needed for muscle spasms. 01/27/15   Arlee Muslim, PA-C  polyethylene glycol powder (GLYCOLAX/MIRALAX) powder Take 17 g by mouth at bedtime.     Historical Provider, MD  primidone (MYSOLINE) 50 MG tablet Take 2 tablets  (100 mg total) by mouth 3 (three) times daily. 03/28/15   Marcial Pacas, MD  propranolol (INDERAL) 80 MG tablet Take 1 tablet (80 mg total) by mouth 2 (two) times daily. 03/28/15   Marcial Pacas, MD  rosuvastatin (CRESTOR) 10 MG tablet Take 10 mg by mouth every morning.     Historical Provider, MD  tamsulosin (FLOMAX) 0.4 MG CAPS capsule TAKE 1 CAPSULE BY MOUTH ONCE DAILY Patient taking differently: TAKE 0.4 MG BY MOUTH DAILY    Eppie Gibson, MD  traZODone (DESYREL) 50 MG tablet Take 1 tablet (50 mg total) by mouth at bedtime. 03/28/15   Marcial Pacas, MD   Allergies  Allergen Reactions  . Bee Venom Anaphylaxis  . Shellfish Allergy Anaphylaxis    FAMILY HISTORY:  indicated that his mother is deceased. He indicated that his father is deceased.  SOCIAL HISTORY:  reports that he quit smoking about 35 years ago. His smoking use included Cigarettes. He quit after 15 years of use. He has never used smokeless tobacco. He reports that he does not drink alcohol or use illicit drugs.  REVIEW OF SYSTEMS:  Recent Irritability, cough and sputum production. No fevers, chills, n/v/d, HA, confusion. Recent Lt knee pain (total knee in April)  SUBJECTIVE:   VITAL SIGNS: Temp:  [97.1 F (36.2 C)-99.4 F (37.4 C)] 99.4 F (37.4 C) (07/06 0342) Pulse Rate:  [85-115] 98 (07/06 0342) Resp:  [14-33] 14 (07/06 0342) BP: (93-210)/(60-187) 111/70 mmHg (07/06 0342) SpO2:  [89 %-100 %] 100 % (07/06 0342) FiO2 (%):  [70 %-100 %] 70 % (07/06 0342) Weight:  [188 lb 0.8 oz (85.3 kg)] 188 lb 0.8 oz (85.3 kg) (07/06 0158) HEMODYNAMICS:   VENTILATOR SETTINGS: Vent Mode:  [-] PRVC FiO2 (%):  [70 %-100 %] 70 % Set Rate:  [16 bmp-20 bmp] 20 bmp Vt Set:  [500 mL] 500 mL PEEP:  [5 cmH20] 5 cmH20 Plateau Pressure:  [16 cmH20-19 cmH20] 19 cmH20 INTAKE / OUTPUT:  Intake/Output Summary (Last 24 hours) at 04/19/15 0359 Last data filed at 04/19/15 0215  Gross per 24 hour  Intake   1000 ml  Output      0 ml  Net   1000 ml     PHYSICAL EXAMINATION: General -- sedated, unresponsive.  HEENT -- Head is normocephalic. PERRL. Intubated.  Neck -- supple Integument -- intact. No rash, erythema, or ecchymoses.  Chest -- good expansion. Rt side course. Cardiac -- RRR. No murmurs noted.  Abdomen -- soft, nontender. No masses palpable. Bowel sounds present.\ CNS -- sedated. No rigidity at present. Tone good Extremeties - full passive ROM throughout. Rt knee significantly warmer to the touch than Lt. Dorsalis pedis pulses present and symmetrical.   LABS:  CBC  Recent Labs Lab 04/19/15 0051 04/19/15 0059  WBC 11.8*  --   HGB 11.9* 13.6  HCT 37.1* 40.0  PLT 307  --    Coag's  Recent Labs Lab 04/19/15 0051  APTT 30  INR 1.25   BMET  Recent Labs Lab 04/19/15 0051 04/19/15 0059 04/19/15 0130  NA 137 137  --   K 4.6 4.5  --  CL 104 102  --   CO2 15*  --   --   BUN 22* 27*  --   CREATININE 1.11 0.80 1.01  GLUCOSE 185* 185*  --    Electrolytes  Recent Labs Lab 04/19/15 0051  CALCIUM 8.6*   Sepsis Markers  Recent Labs Lab 04/19/15 0059  LATICACIDVEN 10.20*   ABG  Recent Labs Lab 04/19/15 0234  PHART 7.286*  PCO2ART 44.9  PO2ART 241.0*   Liver Enzymes  Recent Labs Lab 04/19/15 0051  AST 50*  ALT 17  ALKPHOS 71  BILITOT 0.4  ALBUMIN 3.2*   Cardiac Enzymes No results for input(s): TROPONINI, PROBNP in the last 168 hours. Glucose  Recent Labs Lab 04/19/15 0221  GLUCAP 144*    Imaging Ct Head Wo Contrast  04/19/2015   CLINICAL DATA:  Found down and unresponsive.  EXAM: CT HEAD WITHOUT CONTRAST  CT CERVICAL SPINE WITHOUT CONTRAST  TECHNIQUE: Multidetector CT imaging of the head and cervical spine was performed following the standard protocol without intravenous contrast. Multiplanar CT image reconstructions of the cervical spine were also generated.  COMPARISON:  None.  FINDINGS: CT HEAD FINDINGS  Skull and Sinuses:Negative for fracture or destructive process. The  mastoids, middle ears, and imaged paranasal sinuses are clear.  Orbits: Bilateral cataract resection.  Brain: No evidence of acute infarction, hemorrhage, hydrocephalus, or mass lesion/mass effect. Generalized cerebral volume loss.  CT CERVICAL SPINE FINDINGS  Negative for acute fracture or subluxation. No prevertebral edema. No gross cervical canal hematoma. Multilevel degenerative disc narrowing and facet arthropathy. No significant osseous canal or foraminal stenosis. No endplate erosion or focal bone lesion.  Interlobular septal thickening at the apices.  IMPRESSION: 1. No acute intracranial or cervical spine findings. 2. Pulmonary edema.   Electronically Signed   By: Monte Fantasia M.D.   On: 04/19/2015 01:22   Ct Cervical Spine Wo Contrast  04/19/2015   CLINICAL DATA:  Found down and unresponsive.  EXAM: CT HEAD WITHOUT CONTRAST  CT CERVICAL SPINE WITHOUT CONTRAST  TECHNIQUE: Multidetector CT imaging of the head and cervical spine was performed following the standard protocol without intravenous contrast. Multiplanar CT image reconstructions of the cervical spine were also generated.  COMPARISON:  None.  FINDINGS: CT HEAD FINDINGS  Skull and Sinuses:Negative for fracture or destructive process. The mastoids, middle ears, and imaged paranasal sinuses are clear.  Orbits: Bilateral cataract resection.  Brain: No evidence of acute infarction, hemorrhage, hydrocephalus, or mass lesion/mass effect. Generalized cerebral volume loss.  CT CERVICAL SPINE FINDINGS  Negative for acute fracture or subluxation. No prevertebral edema. No gross cervical canal hematoma. Multilevel degenerative disc narrowing and facet arthropathy. No significant osseous canal or foraminal stenosis. No endplate erosion or focal bone lesion.  Interlobular septal thickening at the apices.  IMPRESSION: 1. No acute intracranial or cervical spine findings. 2. Pulmonary edema.   Electronically Signed   By: Monte Fantasia M.D.   On: 04/19/2015  01:22   Dg Chest Port 1 View  04/19/2015   CLINICAL DATA:  Endotracheal tube placement  EXAM: PORTABLE CHEST - 1 VIEW  COMPARISON:  03/13/1999  FINDINGS: Endotracheal tube with tip between the clavicular heads and carina. The orogastric tube reaches the stomach at least.  There is interlobular septal thickening and Kerley lines consistent with pulmonary edema, also seen on the contemporaneous cervical spine CT. Opacity is asymmetric to the right. Normal heart size and mediastinal contours.  IMPRESSION: 1. Endotracheal and orogastric tubes are in good position. 2. Pulmonary  edema. 3. Asymmetric opacification of the right chest could be from asymmetric edema or superimposed pneumonia/aspiration.   Electronically Signed   By: Monte Fantasia M.D.   On: 04/19/2015 01:59     ASSESSMENT / PLAN:  PULMONARY OETT 7/6 >> A: vent dependant resp failure 2/2 suspected seizure activity P:   Full vent support Vent bundle CXR QD ABG QD   CARDIOVASCULAR A: Sinus Tach HTN to 076A systolic -- resolved Hypotension Elevated troponin (1.22): likely demand ischemia P:  Monitor electrolytes Watch BP while on propofol S/p 1L fluid bolus  RENAL A:  AG metabolic acidosis (2/2 Lactate/ketoacidosis?) Lactic acidosis (2/2 seizure activity?) Elevated CK (2/2 seizure activity) P:   Trend BMP and Lactate IVF as needed  GASTROINTESTINAL A:  Monitor for signs of constipation or CDiff P:   Stool softener if necessary  HEMATOLOGIC A:  CT - no signs of bleeds Hgb at baseline P:  SQ Hep for DVT proph Trend CBC  INFECTIOUS A:  Suspect aspiration PNA Recent total knee arthroplasty P:   Abx: Unasyn, start date 7/6 >> Monitor knee for signs of infection  ENDOCRINE A:  DM Type 1; some ketonuria in the setting of metabolic acidosis but w/o hyperglycemic state  - possible hypoglycemic seizure as primary cause of AMS P:   SSI  NEUROLOGIC A:  Suspect complex partial status epilepticus (ddx incluses  serotonin syndrome, nms, tetanus) P:   Neurology consulted in ED S/p ativan (10mg  total) S/p Keppra (1500mg ) EEG to be performed Reassess for rigidity RASS goal: -1   FAMILY  - Updates: Wife present in ED; discussed plans to transfer to ICU w/ ED physician and [place] family meet or Palliative Care meeting due by:  04/27/15    Elberta Leatherwood, MD,MS,  PGY2 04/19/2015 3:59 AM       Elberta Leatherwood, MD,MS,  PGY2 04/19/2015 3:59 AM Pager: [phone]

## 2015-04-19 NOTE — Progress Notes (Signed)
AM ABG collected post vent changes at 03:50. Repeat ABG not indicated.

## 2015-04-19 NOTE — Progress Notes (Signed)
CRITICAL VALUE ALERT  Critical value received:lactic acid   Date of notification: 7/6  Time of notification:0600  Critical value read back:yes  Nurse who received alert:  [place]  MD notified (1st page):  elink  Time of first page:  0615  MD notified (2nd page):  Time of second page:  Responding MD:  elink  Time MD responded:  [phone]

## 2015-04-20 ENCOUNTER — Inpatient Hospital Stay (HOSPITAL_COMMUNITY): Payer: 59

## 2015-04-20 DIAGNOSIS — I5181 Takotsubo syndrome: Secondary | ICD-10-CM

## 2015-04-20 DIAGNOSIS — I420 Dilated cardiomyopathy: Secondary | ICD-10-CM

## 2015-04-20 DIAGNOSIS — G9340 Encephalopathy, unspecified: Secondary | ICD-10-CM

## 2015-04-20 DIAGNOSIS — R401 Stupor: Secondary | ICD-10-CM

## 2015-04-20 DIAGNOSIS — R4182 Altered mental status, unspecified: Secondary | ICD-10-CM | POA: Insufficient documentation

## 2015-04-20 DIAGNOSIS — E872 Acidosis: Secondary | ICD-10-CM

## 2015-04-20 DIAGNOSIS — J9600 Acute respiratory failure, unspecified whether with hypoxia or hypercapnia: Secondary | ICD-10-CM

## 2015-04-20 LAB — BASIC METABOLIC PANEL
Anion gap: 7 (ref 5–15)
BUN: 12 mg/dL (ref 6–20)
CO2: 23 mmol/L (ref 22–32)
CREATININE: 0.83 mg/dL (ref 0.61–1.24)
Calcium: 7.7 mg/dL — ABNORMAL LOW (ref 8.9–10.3)
Chloride: 108 mmol/L (ref 101–111)
GFR calc non Af Amer: >60 mL/min (ref >60)
GLUCOSE: 177 mg/dL — AB (ref 65–99)
POTASSIUM: 3.7 mmol/L (ref 3.5–5.1)
Sodium: 138 mmol/L (ref 135–145)

## 2015-04-20 LAB — CBC
HCT: 31.6 % — ABNORMAL LOW (ref 39.0–52.0)
Hemoglobin: 10 g/dL — ABNORMAL LOW (ref 13.0–17.0)
MCH: 27.6 pg (ref 26.0–34.0)
MCHC: 31.6 g/dL (ref 30.0–36.0)
MCV: 87.3 fL (ref 78.0–100.0)
Platelets: 179 10*3/uL (ref 150–400)
RBC: 3.62 MIL/uL — ABNORMAL LOW (ref 4.22–5.81)
RDW: 16.2 % — ABNORMAL HIGH (ref 11.5–15.5)
WBC: 7.9 10*3/uL (ref 4.0–10.5)

## 2015-04-20 LAB — GLUCOSE, CAPILLARY
GLUCOSE-CAPILLARY: 137 mg/dL — AB (ref 65–99)
GLUCOSE-CAPILLARY: 161 mg/dL — AB (ref 65–99)
GLUCOSE-CAPILLARY: 189 mg/dL — AB (ref 65–99)
Glucose-Capillary: 197 mg/dL — ABNORMAL HIGH (ref 65–99)
Glucose-Capillary: 245 mg/dL — ABNORMAL HIGH (ref 65–99)
Glucose-Capillary: 267 mg/dL — ABNORMAL HIGH (ref 65–99)

## 2015-04-20 LAB — AMMONIA: Ammonia: 94 umol/L — ABNORMAL HIGH (ref 9–35)

## 2015-04-20 LAB — TSH: TSH: 0.551 u[IU]/mL (ref 0.350–4.500)

## 2015-04-20 LAB — DIPHTHERIA / TETANUS ANTIBODY PANEL
Diphtheria Ab: 1.13 IU/mL
TETANUS AB: 3.15 [IU]/mL (ref >0.15)

## 2015-04-20 LAB — CK: Total CK: 378 U/L (ref 49–397)

## 2015-04-20 LAB — LACTIC ACID, PLASMA: LACTIC ACID, VENOUS: 1.6 mmol/L (ref 0.5–2.0)

## 2015-04-20 LAB — TROPONIN I: TROPONIN I: 1.01 ng/mL — AB (ref <0.031)

## 2015-04-20 MED ORDER — ASPIRIN 325 MG PO TABS: 325.0000 mg | ORAL_TABLET | Freq: Every day | ORAL | Status: DC

## 2015-04-20 MED ORDER — VITAL HIGH PROTEIN PO LIQD
1000.0000 mL | ORAL | Status: DC
  Filled 2015-04-20 (×2): qty 1000

## 2015-04-20 MED ORDER — ASPIRIN 300 MG RE SUPP
300.0000 mg | Freq: Every day | RECTAL | Status: DC
  Administered 2015-04-20 – 2015-04-21 (×2): 300 mg via RECTAL
  Filled 2015-04-20 (×5): qty 1

## 2015-04-20 NOTE — H&P (Signed)
PULMONARY / CRITICAL CARE MEDICINE   Name: Dale Morrow MRN: [id number] DOB: [birth year]    ADMISSION DATE:  04/19/2015  CHIEF COMPLAINT:  AMS  INITIAL PRESENTATION: 74yo male PMH of DM and prostate CA presented to the ED after being found unresponsive by his wife. Last seen normal around 3:30pm. Initial CGB of 33. Glucose responded well to D50. Rigid and posturing noted. Agitated and moaning in ED. CT Head obtained -- clear. Neurology consulted. Intubated in ED  STUDIES:  CT Head 7/6 - no acute changes CXR 7/6 - opacification of Rt chest; pulm edema 7/6 echo - Pa 40, 25% to 30%. There is akinesis of the mid-apicalanteroseptal, anterolateral, inferolateral, inferoseptal, andapical myocardium. Basal sparing (? Takotsubo) No evidence thrombus. Mri brani 7/6>>>atrophy, neg acute eeg 7/6>>>  SIGNIFICANT EVENTS: Intubation in ED 7/6 >>continuous generalized moderately severe nonspecific slowing of cerebral activity, as seen on previous study. No evidence of seizure   SUBJECTIVE: sedated prop 20   VITAL SIGNS: Temp:  [99.2 F (37.3 C)-100 F (37.8 C)] 99.9 F (37.7 C) (07/07 0400) Pulse Rate:  [84-107] 104 (07/07 0600) Resp:  [18-28] 21 (07/07 0600) BP: (85-155)/(49-138) 106/61 mmHg (07/07 0600) SpO2:  [99 %-100 %] 100 % (07/07 0600) FiO2 (%):  [40 %] 40 % (07/07 0600) Weight:  [83.2 kg (183 lb 6.8 oz)] 83.2 kg (183 lb 6.8 oz) (07/07 0500) HEMODYNAMICS:   VENTILATOR SETTINGS: Vent Mode:  [-] PRVC FiO2 (%):  [40 %] 40 % Set Rate:  [20 bmp] 20 bmp Vt Set:  [500 mL] 500 mL PEEP:  [5 cmH20] 5 cmH20 Plateau Pressure:  [12 cmH20-14 cmH20] 14 cmH20 INTAKE / OUTPUT:  Intake/Output Summary (Last 24 hours) at 04/20/15 0746 Last data filed at 04/20/15 0600  Gross per 24 hour  Intake 1530.84 ml  Output   1450 ml  Net  80.84 ml    PHYSICAL EXAMINATION: General -- sedated, rass - 2  HEENT -- perrl 2 Intubated.  Neck -- supple  Chest -- ronchi rt  Cardiac -- RRR. No murmurs  noted.  s1 s 2 Abdomen -- soft, nontender. No masses palpable. Bowel sounds present CNS -- sedated, not rigid Extremeties - full passive ROM throughout.  LABS:  CBC  Recent Labs Lab 04/19/15 0051 04/19/15 0059 04/19/15 0430 04/20/15 0209  WBC 11.8*  --  11.2* 7.9  HGB 11.9* 13.6 11.7* 10.0*  HCT 37.1* 40.0 36.0* 31.6*  PLT 307  --  260 179   Coag's  Recent Labs Lab 04/19/15 0051  APTT 30  INR 1.25   BMET  Recent Labs Lab 04/19/15 0051 04/19/15 0059 04/19/15 0130 04/19/15 0430 04/20/15 0209  NA 137 137  --  137 138  K 4.6 4.5  --  5.1 3.7  CL 104 102  --  104 108  CO2 15*  --   --  21* 23  BUN 22* 27*  --  20 12  CREATININE 1.11 0.80 1.01 0.85 0.83  GLUCOSE 185* 185*  --  183* 177*   Electrolytes  Recent Labs Lab 04/19/15 0051 04/19/15 0430 04/20/15 0209  CALCIUM 8.6* 8.3* 7.7*  MG  --  2.2  --   PHOS  --  2.4*  --    Sepsis Markers  Recent Labs Lab 04/19/15 0059 04/19/15 0430 04/20/15 0209  LATICACIDVEN 10.20* 3.6* 1.6  PROCALCITON  --  <0.10  --    ABG  Recent Labs Lab 04/19/15 0234 04/19/15 0350  PHART 7.286* 7.378  PCO2ART 44.9  31.6*  PO2ART 241.0* 379*   Liver Enzymes  Recent Labs Lab 04/19/15 0051  AST 50*  ALT 17  ALKPHOS 71  BILITOT 0.4  ALBUMIN 3.2*   Cardiac Enzymes No results for input(s): TROPONINI, PROBNP in the last 168 hours. Glucose  Recent Labs Lab 04/19/15 0741 04/19/15 1248 04/19/15 1522 04/19/15 1949 04/20/15 0015 04/20/15 0434  GLUCAP 239* 259* 285* 146* 161* 197*    Imaging Mr Brain Wo Contrast  04/19/2015   CLINICAL DATA:  Altered mental status.  Unresponsive.  EXAM: MRI HEAD WITHOUT CONTRAST  TECHNIQUE: Multiplanar, multiecho pulse sequences of the brain and surrounding structures were obtained without intravenous contrast.  COMPARISON:  CT head 04/19/2015  FINDINGS: Mild to moderate atrophy. Mild ventricular enlargement consistent with the level of atrophy.  Negative for acute infarct.  Minimal chronic microvascular ischemic change in the white matter.  Pituitary normal in size. Bilateral lens replacement. No orbital mass. Paranasal sinuses clear.  Negative for intracranial hemorrhage or fluid collection.  Negative for mass or edema.  No shift of the midline structures.  IMPRESSION: Generalized atrophy.  Minimal chronic microvascular ischemia.  No acute abnormality.   Electronically Signed   By: Franchot Gallo M.D.   On: 04/19/2015 12:37     ASSESSMENT / PLAN:  PULMONARY OETT 7/6 >> A: vent dependant resp failure 2/2 suspected seizure activity, diffuse rt infiltrate improved, r/o asp P:   abg reviewed, keep same mV Wean cpap 5 ps 5, with WUA today Repeat pcxr in am   CARDIOVASCULAR A: r/o TAkasubu, CHF noted, r/o ischemia P:  Echo noted Cardiology consult Tele Trop repeat Asa as able Unable to tolerate BB  RENAL A:  AG metabolic acidosis (2/2 Lactate/ketoacidosis?) Lactic acidosis (2/2 seizure activity?) Elevated CK (2/2 seizure activity) P:   EF noted, kvo No pressors bmet in am   GASTROINTESTINAL A:  Monitor for signs of constipation or CDiff P:   Stool softener if necessary Start T F lft in am   HEMATOLOGIC A:  CT - no signs of bleeds Hgb at baseline P:  SQ Hep for DVT proph Trend CBC Asa for possible ischemia  INFECTIOUS A:  Suspect aspiration PNA Recent total knee arthroplasty P:   Abx: Unasyn, start date 7/6 >> Monitor knee for signs of infection  Maintain current abx  ENDOCRINE A:  DM Type 1; Likely hypoglycemic seizure as primary cause of AMS P:   SSI Has glu values 160-197 , d5 now off When glu greater 200 x 2 than will add lantus 5 T f to start, so likely to come fast  NEUROLOGIC A:  Suspect complex partial status epilepticus (ddx incluses serotonin syndrome, nms, tetanus) P:   Neurology Reassess for rigidity with WUA MRi reassuring RASS goal: -1 wua today keppra eeg and MRI reviewed   FAMILY  - Updates: Wife  updated by DF  - Inter-disciplinary family meet or Palliative Care meeting due by:  04/27/15   Ccm time 30 min  Lavon Paganini. Titus Mould, MD, [place] Pgr: [employer] [facility]

## 2015-04-20 NOTE — Progress Notes (Signed)
CRITICAL VALUE ALERT  Critical value received:  Troponin 1.01  Date of notification:  04/20/15  Time of notification:  1121  Critical value read back:Yes.    Nurse who received alert:  [place]  MD notified (1st page):  Dr. Titus Mould   Time of first page:  1453  MD notified (2nd page): Dr. Titus Mould  Time of second page: 1454  Responding MD:  Dr. Titus Mould  Time MD responded: [phone]

## 2015-04-20 NOTE — Consult Note (Addendum)
Admit date: 04/19/2015 Referring Physician  Dr. Titus Mould Primary Physician WEBB, Valla Leaver, MD Primary Cardiologist  None Reason for Consultation  Decreased ejection fraction  HPI: 74 year old with diabetes, prostate cancer with newly discovered ejection fraction of 25% with characteristics of Takotsubo cardiomyopathy, recent hypoglycemic, mental status change, suspicion of aspiration pneumonia, normal brain MRI with elevated CK possibly secondary to seizure activity, lactic acidosis.  He presented to the emergency department after being found unresponsive by his wife, initial blood glucose was 33, he was rigid and posturing, agitated. During his posturing, EEG was performed and was negative for seizure-like activity.  In talking with his wife, he did not demonstrate any shortness of breath, orthopnea, chest pain prior to this hospitalization. He was however undergoing physical therapy for his redo knee operation (Dr. Wynelle Link) and she notes that over the past few days he may felt more weakness than usual, more balance/equilibrium issues.  No prior cardiac history. Recently tolerated knee surgery well.    PMH:   Past Medical History  Diagnosis Date  . Arthritis     left knee  . Depression   . De Quervain's tenosynovitis, left 04/2012  . Tremors of nervous system     takes propranolol for tremors  . History of radiation therapy 3 years ago  . Prostate cancer, primary, with metastasis from prostate to other site     metastasis to lymph nodes  . Complication of anesthesia     pt denies ponv, pt woke up during colonscopy in past  . Diabetes mellitus     Insulin pump, takes lisinopril for kidneys, dr Buddy Duty endocrinologist    PSH:   Past Surgical History  Procedure Laterality Date  . Hemorrhoid surgery  1970s  . Anterior cruciate ligament repair      right knee  . Wrist arthroscopy  05/03/2010    right; and release of 1st dorsal compartment  . Knee arthrotomy  03/18/2008    left;  with scar exc.  [name] Knee arthroscopy  09/25/2007; 04/15/2005    left  . Knee joint manipulation  06/29/2007    left  . Total knee arthroplasty  04/29/2007    left  . Dorsal compartment release  04/21/2012    Procedure: RELEASE DORSAL COMPARTMENT (DEQUERVAIN);  Surgeon: Cammie Sickle., MD;  Location[facility];  Service: Orthopedics;  Laterality: Left;  left 1st dorsal compartment release left wrist   . Minor amputation of digit Left 07/21/2013    Procedure: LEFT SMALL REVISION AMPUTATION (MINOR PROCEDURE) ;  Surgeon: Schuyler Amor, MD;  Location: [place];  Service: Orthopedics;  Laterality: Left;  . Colonoscopy with propofol N/A 04/07/2014    Procedure: COLONOSCOPY WITH PROPOFOL;  Surgeon: Jeryl Columbia, MD;  Location: [facility];  Service: Endoscopy;  Laterality: N/A;  . Total knee revision Left 01/25/2015    Procedure: LEFT TOTAL KNEE  ARTHOPLASTY REVISION;  Surgeon: Gaynelle Arabian, MD;  Location: [facility];  Service: Orthopedics;  Laterality: Left;   Allergies:  Bee venom and Shellfish allergy Prior to Admit Meds:   Prior to Admission medications   Medication Sig Start Date End Date Taking? Authorizing Provider  CALCIUM-VITAMIN D PO Take 1 tablet by mouth daily at 12 noon.   Yes Historical Provider, MD  clonazePAM (KLONOPIN) 0.5 MG tablet One tab po bid prn for anxiety, 2 tabs prn at night for insomnia Patient taking differently: Take 0.5 mg by mouth 2 (two) times daily as needed for  anxiety (sleep).  04/12/15  Yes Marcial Pacas, MD  denosumab (PROLIA) 60 MG/ML SOLN injection Inject 60 mg into the skin every 6 (six) months. Administer in upper arm, thigh, or abdomen   Yes Historical Provider, MD  escitalopram (LEXAPRO) 20 MG tablet TAKE 1 TABLET BY MOUTH ONCE DAILY Patient taking differently: TAKE 20 MG BY MOUTH ONCE DAILY 04/11/14  Yes Marcial Pacas, MD  Esomeprazole Magnesium (NEXIUM 24HR PO) Take 1 tablet by mouth daily at 12 noon.    Yes Historical Provider, MD    fluticasone (FLONASE) 50 MCG/ACT nasal spray Place 1 spray into both nostrils daily as needed for allergies or rhinitis.   Yes Historical Provider, MD  HYDROcodone-acetaminophen (NORCO/VICODIN) 5-325 MG per tablet Take 1 tablet by mouth every 8 (eight) hours.  08/12/14  Yes Historical Provider, MD  Insulin Human (INSULIN PUMP) SOLN Inject into the skin. Uses humalog insulin basal rate 1200 am= 1.2, 300 am= 1.2, 600 am=0.85, 500 pm=9.0   Yes Historical Provider, MD  insulin lispro (HUMALOG) 100 UNIT/ML injection Inject into the skin 3 (three) times daily with meals as needed for high blood sugar (Sliding scale).    Yes Historical Provider, MD  lisinopril (PRINIVIL,ZESTRIL) 10 MG tablet Take 10 mg by mouth every morning.    Yes Historical Provider, MD  Multiple Vitamins-Minerals (ONE-A-DAY MENS 50+ ADVANTAGE PO) Take 1 tablet by mouth daily at 12 noon.   Yes Historical Provider, MD  polyethylene glycol powder (GLYCOLAX/MIRALAX) powder Take 17 g by mouth at bedtime.    Yes Historical Provider, MD  primidone (MYSOLINE) 50 MG tablet Take 2 tablets (100 mg total) by mouth 3 (three) times daily. 03/28/15  Yes Marcial Pacas, MD  propranolol (INDERAL) 80 MG tablet Take 1 tablet (80 mg total) by mouth 2 (two) times daily. 03/28/15  Yes Marcial Pacas, MD  rosuvastatin (CRESTOR) 10 MG tablet Take 10 mg by mouth every morning.    Yes Historical Provider, MD  tamsulosin (FLOMAX) 0.4 MG CAPS capsule TAKE 1 CAPSULE BY MOUTH ONCE DAILY Patient taking differently: TAKE 0.4 MG BY MOUTH DAILY   Yes Eppie Gibson, MD  traZODone (DESYREL) 50 MG tablet Take 1 tablet (50 mg total) by mouth at bedtime. 03/28/15  Yes Marcial Pacas, MD  enoxaparin (LOVENOX) 40 MG/0.4ML injection Inject 0.4 mLs (40 mg total) into the skin daily. Take injections for 8 more days starting Saturday 01/28/2015.  Once completed the Lovenox, then start a 325 mg Aspirin and take twice a day for three more weeks. Patient not taking: Reported on 04/19/2015 01/27/15   Arlee Muslim, PA-C  methocarbamol (ROBAXIN) 500 MG tablet Take 1 tablet (500 mg total) by mouth every 6 (six) hours as needed for muscle spasms. Patient not taking: Reported on 04/19/2015 01/27/15   Arlee Muslim, PA-C   Fam HX:    Family History  Problem Relation Age of Onset  . Heart Problems Father    Social HX:    History   Social History  . Marital Status: Married    Spouse Name: Irfan Veal  . Number of Children: o  . Years of Education: college   Occupational History  . retired    Social History Main Topics  . Smoking status: Former Smoker -- 15 years    Types: Cigarettes    Quit date: 10/13/1979  . Smokeless tobacco: Never Used  . Alcohol Use: No  . Drug Use: No  . Sexual Activity: Not on file   Other Topics Concern  .  Not on file   Social History Narrative   Patient lives at home with his wife [name]) and there four dogs.   Retired.   Education two years of college.   Right handed.   Caffeine one cup of coffee daily.     ROS:  Unable to fully assess. Obtained from wife. No orthopnea, no PND, no shortness of breath, no chest pain complaints. Positive for weakness or tiredness recently. All 11 ROS were addressed and are negative except what is stated in the HPI   Physical Exam: Blood pressure 104/65, pulse 108, temperature 99.7 F (37.6 C), temperature source Axillary, resp. rate 19, height 5\' 8"  (1.727 m), weight 183 lb 6.8 oz (83.2 kg), SpO2 100 %.   General: Well developed, well nourished, moaning, encephalopathic, when testing reflexes, says "no Head: Eyes "closed, No xanthomas.   Normal cephalic and atramatic  Lungs:   Clear bilaterally to auscultation and percussion. Normal respiratory effort. No wheezes, no rales. No significant rhonchi heard right lung (area of prior pulmonary edema) Heart:   HRRR S1 S2 Pulses are 2+ & equal. No murmur, rubs, gallops.  No carotid bruit. No significant JVD.  No abdominal bruits.  Abdomen: Bowel sounds are positive, abdomen  soft and non-tender without masses. No hepatosplenomegaly. Msk:  Back normal. Normal strength and tone for age. Extremities:  No clubbing, cyanosis or edema.  DP +1, left knee scar noted Neuro: Awake,appears non-focal, MAE x 4 GU: Deferred Rectal: Deferred Psych:  Moaning, encephalopathic      Labs: Lab Results  Component Value Date   WBC 7.9 04/20/2015   HGB 10.0* 04/20/2015   HCT 31.6* 04/20/2015   MCV 87.3 04/20/2015   PLT 179 04/20/2015     Recent Labs Lab 04/19/15 0051  04/20/15 0209  NA 137  < > 138  K 4.6  < > 3.7  CL 104  < > 108  CO2 15*  < > 23  BUN 22*  < > 12  CREATININE 1.11  < > 0.83  CALCIUM 8.6*  < > 7.7*  PROT 6.7  --   --   BILITOT 0.4  --   --   ALKPHOS 71  --   --   ALT 17  --   --   AST 50*  --   --   GLUCOSE 185*  < > 177*  < > = values in this interval not displayed.  Recent Labs  04/19/15 0130 04/20/15 0209  CKTOTAL 490* 378   Lab Results  Component Value Date   TRIG 63 04/19/2015   No results found for: DDIMER   Radiology:  Ct Head Wo Contrast  04/19/2015   CLINICAL DATA:  Found down and unresponsive.  EXAM: CT HEAD WITHOUT CONTRAST  CT CERVICAL SPINE WITHOUT CONTRAST  TECHNIQUE: Multidetector CT imaging of the head and cervical spine was performed following the standard protocol without intravenous contrast. Multiplanar CT image reconstructions of the cervical spine were also generated.  COMPARISON:  None.  FINDINGS: CT HEAD FINDINGS  Skull and Sinuses:Negative for fracture or destructive process. The mastoids, middle ears, and imaged paranasal sinuses are clear.  Orbits: Bilateral cataract resection.  Brain: No evidence of acute infarction, hemorrhage, hydrocephalus, or mass lesion/mass effect. Generalized cerebral volume loss.  CT CERVICAL SPINE FINDINGS  Negative for acute fracture or subluxation. No prevertebral edema. No gross cervical canal hematoma. Multilevel degenerative disc narrowing and facet arthropathy. No significant osseous  canal or foraminal stenosis. No endplate erosion or  focal bone lesion.  Interlobular septal thickening at the apices.  IMPRESSION: 1. No acute intracranial or cervical spine findings. 2. Pulmonary edema.   Electronically Signed   By: Monte Fantasia M.D.   On: 04/19/2015 01:22   Ct Cervical Spine Wo Contrast  04/19/2015   CLINICAL DATA:  Found down and unresponsive.  EXAM: CT HEAD WITHOUT CONTRAST  CT CERVICAL SPINE WITHOUT CONTRAST  TECHNIQUE: Multidetector CT imaging of the head and cervical spine was performed following the standard protocol without intravenous contrast. Multiplanar CT image reconstructions of the cervical spine were also generated.  COMPARISON:  None.  FINDINGS: CT HEAD FINDINGS  Skull and Sinuses:Negative for fracture or destructive process. The mastoids, middle ears, and imaged paranasal sinuses are clear.  Orbits: Bilateral cataract resection.  Brain: No evidence of acute infarction, hemorrhage, hydrocephalus, or mass lesion/mass effect. Generalized cerebral volume loss.  CT CERVICAL SPINE FINDINGS  Negative for acute fracture or subluxation. No prevertebral edema. No gross cervical canal hematoma. Multilevel degenerative disc narrowing and facet arthropathy. No significant osseous canal or foraminal stenosis. No endplate erosion or focal bone lesion.  Interlobular septal thickening at the apices.  IMPRESSION: 1. No acute intracranial or cervical spine findings. 2. Pulmonary edema.   Electronically Signed   By: Monte Fantasia M.D.   On: 04/19/2015 01:22   Mr Brain Wo Contrast  04/19/2015   CLINICAL DATA:  Altered mental status.  Unresponsive.  EXAM: MRI HEAD WITHOUT CONTRAST  TECHNIQUE: Multiplanar, multiecho pulse sequences of the brain and surrounding structures were obtained without intravenous contrast.  COMPARISON:  CT head 04/19/2015  FINDINGS: Mild to moderate atrophy. Mild ventricular enlargement consistent with the level of atrophy.  Negative for acute infarct. Minimal chronic  microvascular ischemic change in the white matter.  Pituitary normal in size. Bilateral lens replacement. No orbital mass. Paranasal sinuses clear.  Negative for intracranial hemorrhage or fluid collection.  Negative for mass or edema.  No shift of the midline structures.  IMPRESSION: Generalized atrophy.  Minimal chronic microvascular ischemia.  No acute abnormality.   Electronically Signed   By: Franchot Gallo M.D.   On: 04/19/2015 12:37   Dg Chest Port 1 View  04/20/2015   CLINICAL DATA:  Acute respiratory failure.  EXAM: PORTABLE CHEST - 1 VIEW  COMPARISON:  04/19/2015.  FINDINGS: Endotracheal tube and NG tube in good anatomic position. Cardiomegaly with interim significant clearing of pulmonary infiltrates consistent with clearing pulmonary edema. Mild to moderate moderate residual. No pleural effusion or pneumothorax. Left costophrenic angle not imaged.  IMPRESSION: 1. Lines and tubes in stable position. 2. Partial clearing of congestive heart failure and pulmonary edema.   Electronically Signed   By: Marcello Moores  Register   On: 04/20/2015 08:03   Dg Chest Port 1 View  04/19/2015   CLINICAL DATA:  Endotracheal tube placement  EXAM: PORTABLE CHEST - 1 VIEW  COMPARISON:  03/13/1999  FINDINGS: Endotracheal tube with tip between the clavicular heads and carina. The orogastric tube reaches the stomach at least.  There is interlobular septal thickening and Kerley lines consistent with pulmonary edema, also seen on the contemporaneous cervical spine CT. Opacity is asymmetric to the right. Normal heart size and mediastinal contours.  IMPRESSION: 1. Endotracheal and orogastric tubes are in good position. 2. Pulmonary edema. 3. Asymmetric opacification of the right chest could be from asymmetric edema or superimposed pneumonia/aspiration.   Electronically Signed   By: Monte Fantasia M.D.   On: 04/19/2015 01:59   Personally viewed.  EKG:  04/19/15-sinus tachycardia rate 115 with prolonged QT interval, approximately 500  ms Personally viewed.   ASSESSMENT/PLAN:    74 year old male with recent seizure activity, lactic acidosis, acute mental status changes who underwent echocardiogram after developing pulmonary edema with new finding of ejection fraction 25%, decreased left trigger systolic function, with characteristics of stress-induced cardiomyopathy/ Takotsubo.   1. Newly discovered cardiomyopathy/acute systolic heart failure  - With his current clinical situation, recent hypoglycemia/seizure state, and echocardiographic wall motion, this could be a manifestation of Takotsubo cardiomyopathy which hopefully would resolve in the next 7-14 days after his clinical condition improves. If ejection fraction does not improve within this time span, exclusion of coronary artery disease would be warranted. -Blood pressure has been fairly labile and low at times, because of this, he is not currently on beta blocker or ACE inhibitor. Creatinine is normal. When able, begin low-dose ACE inhibitor such as lisinopril 5 mg. (was on 10 mg of lisinopril at home) -Clear improvement of right lung opacification, possibly pulmonary edema. Currently does not appear to be fluid overloaded. No evidence of edema. No significant JVD. -Troponin was 1.22 on 04/19/15, point-of-care. CK was 490, decreased at 378. Minimally elevated likely in the setting of new onset cardiomyopathy. No ST segment changes, no complaints of chest discomfort although encephalopathic.  2. Encephalopathy  - Per neurology-in the setting of hypoglycemia, posturing noted however no seizure-like activity on EEG.  3. Metabolic acidosis  - Influenced by decreased cardiac output, hypoglycemic event leading to unconscious state. Improved.  We will continue to follow. Once again, we'll try to add back ACE inhibitor and also add traditional beta blocker for heart failure management. At this point, does not appear to require furosemide. Monitor for signs of fluid  overload.  Candee Furbish, MD  04/20/2015  8:49 AM

## 2015-04-20 NOTE — Progress Notes (Signed)
Subjective: Extubated.  Moaning.  No further stiffening episodes.   Objective: Current vital signs: BP 104/65 mmHg  Pulse 108  Temp(Src) 99.7 F (37.6 C) (Axillary)  Resp 19  Ht 5\' 8"  (1.727 m)  Wt 83.2 kg (183 lb 6.8 oz)  BMI 27.90 kg/m2  SpO2 100% Vital signs in last 24 hours: Temp:  [99.2 F (37.3 C)-100 F (37.8 C)] 99.7 F (37.6 C) (07/07 0802) Pulse Rate:  [84-108] 108 (07/07 0813) Resp:  [18-28] 19 (07/07 0813) BP: (85-155)/(51-138) 104/65 mmHg (07/07 0813) SpO2:  [99 %-100 %] 100 % (07/07 0813) FiO2 (%):  [40 %] 40 % (07/07 0813) Weight:  [83.2 kg (183 lb 6.8 oz)] 83.2 kg (183 lb 6.8 oz) (07/07 0500)  Intake/Output from previous day: 07/06 0701 - 07/07 0700 In: 1635.8 [I.V.:970.8; IV Piggyback:665] Out: 1450 [Urine:1450] Intake/Output this shift: Total I/O In: 98.7 [I.V.:98.7] Out: -  Nutritional status: Diet NPO time specified  Neurologic Exam: General: Mental Status: Alert, moaning, follows no commands, withdraws briskly from pain.  Cranial Nerves: II: eyes open but no blink to threat, pupils equal, round, reactive to light and accommodation III,IV, VI: ptosis not present, doll's eyes intact V,VII: face symmetric, facial light touch sensation normal bilaterally IX,X: uvula rises symmetrically XII: midline tongue without atrophy or fasciculations  Motor: Moving all extremities antigravity with good strength both spontaneously and purposefully. No increased tone noted.  Sensory: with drwas from pain bilaterally Deep Tendon Reflexes:  1+ bilaterally UE and LE  Plantars: Mute bilaterally   Lab Results: Basic Metabolic Panel:  Recent Labs Lab 04/19/15 0051 04/19/15 0059 04/19/15 0130 04/19/15 0430 04/20/15 0209  NA 137 137  --  137 138  K 4.6 4.5  --  5.1 3.7  CL 104 102  --  104 108  CO2 15*  --   --  21* 23  GLUCOSE 185* 185*  --  183* 177*  BUN 22* 27*  --  20 12  CREATININE 1.11 0.80 1.01 0.85 0.83  CALCIUM 8.6*  --   --  8.3* 7.7*   MG  --   --   --  2.2  --   PHOS  --   --   --  2.4*  --     Liver Function Tests:  Recent Labs Lab 04/19/15 0051  AST 50*  ALT 17  ALKPHOS 71  BILITOT 0.4  PROT 6.7  ALBUMIN 3.2*   No results for input(s): LIPASE, AMYLASE in the last 168 hours. No results for input(s): AMMONIA in the last 168 hours.  CBC:  Recent Labs Lab 04/19/15 0051 04/19/15 0059 04/19/15 0430 04/20/15 0209  WBC 11.8*  --  11.2* 7.9  NEUTROABS 10.2*  --   --   --   HGB 11.9* 13.6 11.7* 10.0*  HCT 37.1* 40.0 36.0* 31.6*  MCV 87.5  --  87.0 87.3  PLT 307  --  260 179    Cardiac Enzymes:  Recent Labs Lab 04/19/15 0130 04/20/15 0209  CKTOTAL 490* 378    Lipid Panel:  Recent Labs Lab 04/19/15 0430  TRIG 63    CBG:  Recent Labs Lab 04/19/15 1522 04/19/15 1949 04/20/15 0015 04/20/15 0434 04/20/15 0800  GLUCAP 285* 146* 161* 197* 189*    Microbiology: Results for orders placed or performed during the hospital encounter of 04/19/15  MRSA PCR Screening     Status: None   Collection Time: 04/19/15  3:38 AM  Result Value Ref Range Status   MRSA  by PCR NEGATIVE NEGATIVE Final    Comment:        The GeneXpert MRSA Assay (FDA approved for NASAL specimens only), is one component of a comprehensive MRSA colonization surveillance program. It is not intended to diagnose MRSA infection nor to guide or monitor treatment for MRSA infections.     Coagulation Studies:  Recent Labs  04/19/15 0051  LABPROT 15.9*  INR 1.25    Imaging: Ct Head Wo Contrast  04/19/2015   CLINICAL DATA:  Found down and unresponsive.  EXAM: CT HEAD WITHOUT CONTRAST  CT CERVICAL SPINE WITHOUT CONTRAST  TECHNIQUE: Multidetector CT imaging of the head and cervical spine was performed following the standard protocol without intravenous contrast. Multiplanar CT image reconstructions of the cervical spine were also generated.  COMPARISON:  None.  FINDINGS: CT HEAD FINDINGS  Skull and Sinuses:Negative for  fracture or destructive process. The mastoids, middle ears, and imaged paranasal sinuses are clear.  Orbits: Bilateral cataract resection.  Brain: No evidence of acute infarction, hemorrhage, hydrocephalus, or mass lesion/mass effect. Generalized cerebral volume loss.  CT CERVICAL SPINE FINDINGS  Negative for acute fracture or subluxation. No prevertebral edema. No gross cervical canal hematoma. Multilevel degenerative disc narrowing and facet arthropathy. No significant osseous canal or foraminal stenosis. No endplate erosion or focal bone lesion.  Interlobular septal thickening at the apices.  IMPRESSION: 1. No acute intracranial or cervical spine findings. 2. Pulmonary edema.   Electronically Signed   By: Monte Fantasia M.D.   On: 04/19/2015 01:22   Ct Cervical Spine Wo Contrast  04/19/2015   CLINICAL DATA:  Found down and unresponsive.  EXAM: CT HEAD WITHOUT CONTRAST  CT CERVICAL SPINE WITHOUT CONTRAST  TECHNIQUE: Multidetector CT imaging of the head and cervical spine was performed following the standard protocol without intravenous contrast. Multiplanar CT image reconstructions of the cervical spine were also generated.  COMPARISON:  None.  FINDINGS: CT HEAD FINDINGS  Skull and Sinuses:Negative for fracture or destructive process. The mastoids, middle ears, and imaged paranasal sinuses are clear.  Orbits: Bilateral cataract resection.  Brain: No evidence of acute infarction, hemorrhage, hydrocephalus, or mass lesion/mass effect. Generalized cerebral volume loss.  CT CERVICAL SPINE FINDINGS  Negative for acute fracture or subluxation. No prevertebral edema. No gross cervical canal hematoma. Multilevel degenerative disc narrowing and facet arthropathy. No significant osseous canal or foraminal stenosis. No endplate erosion or focal bone lesion.  Interlobular septal thickening at the apices.  IMPRESSION: 1. No acute intracranial or cervical spine findings. 2. Pulmonary edema.   Electronically Signed   By:  Monte Fantasia M.D.   On: 04/19/2015 01:22   Mr Brain Wo Contrast  04/19/2015   CLINICAL DATA:  Altered mental status.  Unresponsive.  EXAM: MRI HEAD WITHOUT CONTRAST  TECHNIQUE: Multiplanar, multiecho pulse sequences of the brain and surrounding structures were obtained without intravenous contrast.  COMPARISON:  CT head 04/19/2015  FINDINGS: Mild to moderate atrophy. Mild ventricular enlargement consistent with the level of atrophy.  Negative for acute infarct. Minimal chronic microvascular ischemic change in the white matter.  Pituitary normal in size. Bilateral lens replacement. No orbital mass. Paranasal sinuses clear.  Negative for intracranial hemorrhage or fluid collection.  Negative for mass or edema.  No shift of the midline structures.  IMPRESSION: Generalized atrophy.  Minimal chronic microvascular ischemia.  No acute abnormality.   Electronically Signed   By: Franchot Gallo M.D.   On: 04/19/2015 12:37   Dg Chest Port 1 View  04/20/2015  CLINICAL DATA:  Acute respiratory failure.  EXAM: PORTABLE CHEST - 1 VIEW  COMPARISON:  04/19/2015.  FINDINGS: Endotracheal tube and NG tube in good anatomic position. Cardiomegaly with interim significant clearing of pulmonary infiltrates consistent with clearing pulmonary edema. Mild to moderate moderate residual. No pleural effusion or pneumothorax. Left costophrenic angle not imaged.  IMPRESSION: 1. Lines and tubes in stable position. 2. Partial clearing of congestive heart failure and pulmonary edema.   Electronically Signed   By: Marcello Moores  Register   On: 04/20/2015 08:03   Dg Chest Port 1 View  04/19/2015   CLINICAL DATA:  Endotracheal tube placement  EXAM: PORTABLE CHEST - 1 VIEW  COMPARISON:  03/13/1999  FINDINGS: Endotracheal tube with tip between the clavicular heads and carina. The orogastric tube reaches the stomach at least.  There is interlobular septal thickening and Kerley lines consistent with pulmonary edema, also seen on the contemporaneous  cervical spine CT. Opacity is asymmetric to the right. Normal heart size and mediastinal contours.  IMPRESSION: 1. Endotracheal and orogastric tubes are in good position. 2. Pulmonary edema. 3. Asymmetric opacification of the right chest could be from asymmetric edema or superimposed pneumonia/aspiration.   Electronically Signed   By: Monte Fantasia M.D.   On: 04/19/2015 01:59    Medications:  Scheduled: . ampicillin-sulbactam (UNASYN) IV  3 g Intravenous Q8H  . antiseptic oral rinse  7 mL Mouth Rinse QID  . aspirin  325 mg Oral Daily  . chlorhexidine  15 mL Mouth Rinse BID  . famotidine (PEPCID) IV  20 mg Intravenous Q12H  . feeding supplement (VITAL HIGH PROTEIN)  1,000 mL Per Tube Q24H  . heparin  5,000 Units Subcutaneous 3 times per day  . insulin aspart  0-15 Units Subcutaneous 6 times per day  . levETIRAcetam  500 mg Intravenous Q12H    Assessment/Plan: 74 YO male with AMS and possible seizure in setting of hypoglycemia (BG 33). EEG at time of arrival and on 04/20/2015 during episode of increased tone and posturing showed no epileptiform activity.  Extubated and currently encephalopathic moaning and moving all extremities spontaneously and purposefully. Suspect AMS likely secondary to metabolic encephalopathy which is improving. Will continue Keppra for time being until more alert.   Will continue to follow  Etta Quill PA-C [facility] [phone]  04/20/2015, 9:29 AM  I personally participated in this patient's evaluation and management, including warm 11 above clinical impression and management recommendations.  Rush Farmer M.D. [facility] [phone]

## 2015-04-20 NOTE — Procedures (Signed)
Extubation Procedure Note  Patient Details:   Name: Dale Morrow DOB: 04/01/1941 MRN: [id number]   Airway Documentation:     Evaluation  O2 sats: stable throughout Complications: No apparent complications Patient did tolerate procedure well. Bilateral Breath Sounds: Clear Suctioning: Oral, Airway Yes.  Pt extubated per dr. Kayleen Memos.  Placed on 3L [place]    Alberto Pina V 04/20/2015, 9:10 AM

## 2015-04-20 NOTE — Progress Notes (Signed)
[facility] Progress Note Patient Name: Dale Morrow DOB: 1941/08/29 MRN: [id number]   Date of Service  04/20/2015  HPI/Events of Note  Patient recently extubated, with moderate agitation, become violent and trying to punch staff  eICU Interventions  Medical restraints, cont with prn fentanyl     Intervention Category Intermediate Interventions: Other:  Milliana Reddoch 04/20/2015, 5:20 PM

## 2015-04-21 ENCOUNTER — Inpatient Hospital Stay (HOSPITAL_COMMUNITY): Payer: 59

## 2015-04-21 ENCOUNTER — Encounter: Payer: Medicare Other | Admitting: Physical Therapy

## 2015-04-21 DIAGNOSIS — R7989 Other specified abnormal findings of blood chemistry: Secondary | ICD-10-CM

## 2015-04-21 DIAGNOSIS — R40243 Glasgow coma scale score 3-8: Secondary | ICD-10-CM

## 2015-04-21 DIAGNOSIS — R778 Other specified abnormalities of plasma proteins: Secondary | ICD-10-CM

## 2015-04-21 LAB — CBC WITH DIFFERENTIAL/PLATELET
Basophils Absolute: 0 10*3/uL (ref 0.0–0.1)
Basophils Relative: 0 % (ref 0–1)
Eosinophils Absolute: 0.1 10*3/uL (ref 0.0–0.7)
Eosinophils Relative: 2 % (ref 0–5)
HCT: 28.3 % — ABNORMAL LOW (ref 39.0–52.0)
Hemoglobin: 9.1 g/dL — ABNORMAL LOW (ref 13.0–17.0)
LYMPHS ABS: 1 10*3/uL (ref 0.7–4.0)
LYMPHS PCT: 17 % (ref 12–46)
MCH: 27.9 pg (ref 26.0–34.0)
MCHC: 32.2 g/dL (ref 30.0–36.0)
MCV: 86.8 fL (ref 78.0–100.0)
Monocytes Absolute: 0.6 10*3/uL (ref 0.1–1.0)
Monocytes Relative: 10 % (ref 3–12)
NEUTROS ABS: 4.1 10*3/uL (ref 1.7–7.7)
NEUTROS PCT: 71 % (ref 43–77)
PLATELETS: 155 10*3/uL (ref 150–400)
RBC: 3.26 MIL/uL — AB (ref 4.22–5.81)
RDW: 15.7 % — ABNORMAL HIGH (ref 11.5–15.5)
WBC: 5.8 10*3/uL (ref 4.0–10.5)

## 2015-04-21 LAB — GLUCOSE, CAPILLARY
GLUCOSE-CAPILLARY: 273 mg/dL — AB (ref 65–99)
Glucose-Capillary: 92 mg/dL (ref 65–99)
Glucose-Capillary: 196 mg/dL — ABNORMAL HIGH (ref 65–99)
Glucose-Capillary: 225 mg/dL — ABNORMAL HIGH (ref 65–99)
Glucose-Capillary: 155 mg/dL — ABNORMAL HIGH (ref 65–99)
Glucose-Capillary: 231 mg/dL — ABNORMAL HIGH (ref 65–99)
Glucose-Capillary: 280 mg/dL — ABNORMAL HIGH (ref 65–99)

## 2015-04-21 LAB — COMPREHENSIVE METABOLIC PANEL
ALT: 19 U/L (ref 17–63)
ANION GAP: 8 (ref 5–15)
AST: 61 U/L — ABNORMAL HIGH (ref 15–41)
Albumin: 2.7 g/dL — ABNORMAL LOW (ref 3.5–5.0)
Alkaline Phosphatase: 46 U/L (ref 38–126)
BUN: 8 mg/dL (ref 6–20)
CO2: 23 mmol/L (ref 22–32)
Calcium: 7.6 mg/dL — ABNORMAL LOW (ref 8.9–10.3)
Chloride: 108 mmol/L (ref 101–111)
Creatinine, Ser: 0.77 mg/dL (ref 0.61–1.24)
GFR calc non Af Amer: >60 mL/min (ref >60)
GLUCOSE: 176 mg/dL — AB (ref 65–99)
POTASSIUM: 3.3 mmol/L — AB (ref 3.5–5.1)
SODIUM: 139 mmol/L (ref 135–145)
TOTAL PROTEIN: 5.4 g/dL — AB (ref 6.5–8.1)
Total Bilirubin: 0.5 mg/dL (ref 0.3–1.2)

## 2015-04-21 MED ORDER — ACETAMINOPHEN 650 MG RE SUPP: 650.0000 mg | Freq: Four times a day (QID) | RECTAL | Status: DC | PRN

## 2015-04-21 MED ORDER — CAPTOPRIL 6.25 MG HALF TABLET
6.2500 mg | ORAL_TABLET | Freq: Three times a day (TID) | ORAL | Status: DC
  Administered 2015-04-21: 12.5 mg via ORAL
  Administered 2015-04-21 – 2015-04-22 (×3): 6.25 mg via ORAL
  Filled 2015-04-21 (×7): qty 1

## 2015-04-21 MED ORDER — POTASSIUM CHLORIDE 10 MEQ/100ML IV SOLN
10.0000 meq | INTRAVENOUS | Status: AC
  Administered 2015-04-21 (×3): 10 meq via INTRAVENOUS
  Filled 2015-04-21 (×3): qty 100

## 2015-04-21 NOTE — Progress Notes (Signed)
Subjective: Very depressed but has improved with mentation significantly. Only complains of knee pain and is very specific not to move knee.   Objective: Current vital signs: BP 116/67 mmHg  Pulse 89  Temp(Src) 98.8 F (37.1 C) (Oral)  Resp 10  Ht 5\' 8"  (1.727 m)  Wt 82.7 kg (182 lb 5.1 oz)  BMI 27.73 kg/m2  SpO2 95% Vital signs in last 24 hours: Temp:  [98.4 F (36.9 C)-99.6 F (37.6 C)] 98.8 F (37.1 C) (07/08 0800) Pulse Rate:  [81-118] 89 (07/08 0800) Resp:  [10-29] 10 (07/08 0800) BP: (87-117)/(39-68) 116/67 mmHg (07/08 0800) SpO2:  [95 %-100 %] 95 % (07/08 0800) FiO2 (%):  [40 %] 40 % (07/07 1800) Weight:  [82.7 kg (182 lb 5.1 oz)] 82.7 kg (182 lb 5.1 oz) (07/08 0400)  Intake/Output from previous day: 07/07 0701 - 07/08 0700 In: 1118.7 [I.V.:318.7; IV Piggyback:800] Out: 1005 [Urine:1005] Intake/Output this shift: Total I/O In: 10 [I.V.:10] Out: -  Nutritional status: Diet NPO time specified  Neurologic Exam: General: very depressed Mental Status: Alert, oriented only to wife at bedside. When asked questions he intitaly responds by " I do not know" but if pushed he will answer correctly.  He is able to name objects and follow commands. Perseverating on how he does not want people to touch his leg.  Cranial Nerves: II: Visual fields grossly normal, pupils equal, round, reactive to light and accommodation III,IV, VI: ptosis not present, extra-ocular motions intact bilaterally V,VII: smile symmetric, facial light touch sensation normal bilaterally VIII: hearing normal bilaterally IX,X: uvula rises symmetrically XI: bilateral shoulder shrug XII: midline tongue extension without atrophy or fasciculations  Motor: Right : Upper extremity   5/5    Left:     Upper extremity   5/5  Lower extremity   5/5     Lower extremity   3/5--with significant knee pain Tone and bulk:normal tone throughout; no atrophy noted Sensory: Pinprick and light touch intact throughout,  bilaterally Deep Tendon Reflexes:  Right: Upper Extremity   Left: Upper extremity   biceps (C-5 to C-6) 2/4   biceps (C-5 to C-6) 2/4 tricep (C7) 2/4    triceps (C7) 2/4 Brachioradialis (C6) 2/4  Brachioradialis (C6) 2/4  Lower Extremity Lower Extremity  quadriceps (L-2 to L-4) 2/4   quadriceps (L-2 to L-4) 1/4 Achilles (S1) 1/4   Achilles (S1) 1/4  Plantars: Right: downgoing   Left: downgoing Cerebellar: normal finger-to-nose    Lab Results: Basic Metabolic Panel:  Recent Labs Lab 04/19/15 0051 04/19/15 0059 04/19/15 0130 04/19/15 0430 04/20/15 0209 04/21/15 0211  NA 137 137  --  137 138 139  K 4.6 4.5  --  5.1 3.7 3.3*  CL 104 102  --  104 108 108  CO2 15*  --   --  21* 23 23  GLUCOSE 185* 185*  --  183* 177* 176*  BUN 22* 27*  --  20 12 8   CREATININE 1.11 0.80 1.01 0.85 0.83 0.77  CALCIUM 8.6*  --   --  8.3* 7.7* 7.6*  MG  --   --   --  2.2  --   --   PHOS  --   --   --  2.4*  --   --     Liver Function Tests:  Recent Labs Lab 04/19/15 0051 04/21/15 0211  AST 50* 61*  ALT 17 19  ALKPHOS 71 46  BILITOT 0.4 0.5  PROT 6.7 5.4*  ALBUMIN 3.2* 2.7*  No results for input(s): LIPASE, AMYLASE in the last 168 hours.  Recent Labs Lab 04/20/15 1018  AMMONIA 94*    CBC:  Recent Labs Lab 04/19/15 0051 04/19/15 0059 04/19/15 0430 04/20/15 0209 04/21/15 0211  WBC 11.8*  --  11.2* 7.9 5.8  NEUTROABS 10.2*  --   --   --  4.1  HGB 11.9* 13.6 11.7* 10.0* 9.1*  HCT 37.1* 40.0 36.0* 31.6* 28.3*  MCV 87.5  --  87.0 87.3 86.8  PLT 307  --  260 179 155    Cardiac Enzymes:  Recent Labs Lab 04/19/15 0130 04/20/15 0209 04/20/15 1306  CKTOTAL 490* 378  --   TROPONINI  --   --  1.01*    Lipid Panel:  Recent Labs Lab 04/19/15 0430  TRIG 63    CBG:  Recent Labs Lab 04/20/15 1631 04/20/15 1948 04/20/15 2325 04/21/15 0318 04/21/15 0822  GLUCAP 267* 137* 27 196* 225*    Microbiology: Results for orders placed or performed during the  hospital encounter of 04/19/15  MRSA PCR Screening     Status: None   Collection Time: 04/19/15  3:38 AM  Result Value Ref Range Status   MRSA by PCR NEGATIVE NEGATIVE Final    Comment:        The GeneXpert MRSA Assay (FDA approved for NASAL specimens only), is one component of a comprehensive MRSA colonization surveillance program. It is not intended to diagnose MRSA infection nor to guide or monitor treatment for MRSA infections.     Coagulation Studies:  Recent Labs  04/19/15 0051  LABPROT 15.9*  INR 1.25    Imaging: Mr Brain Wo Contrast  04/19/2015   CLINICAL DATA:  Altered mental status.  Unresponsive.  EXAM: MRI HEAD WITHOUT CONTRAST  TECHNIQUE: Multiplanar, multiecho pulse sequences of the brain and surrounding structures were obtained without intravenous contrast.  COMPARISON:  CT head 04/19/2015  FINDINGS: Mild to moderate atrophy. Mild ventricular enlargement consistent with the level of atrophy.  Negative for acute infarct. Minimal chronic microvascular ischemic change in the white matter.  Pituitary normal in size. Bilateral lens replacement. No orbital mass. Paranasal sinuses clear.  Negative for intracranial hemorrhage or fluid collection.  Negative for mass or edema.  No shift of the midline structures.  IMPRESSION: Generalized atrophy.  Minimal chronic microvascular ischemia.  No acute abnormality.   Electronically Signed   By: Franchot Gallo M.D.   On: 04/19/2015 12:37   Dg Chest Port 1 View  04/21/2015   CLINICAL DATA:  Pneumonia, extubated  EXAM: PORTABLE CHEST - 1 VIEW  COMPARISON:  04/20/2015  FINDINGS: Stable mild cardiomegaly and minimal interstitial edema pattern throughout the lungs. Improving bibasilar atelectasis. No effusion or pneumothorax. Trachea midline. No acute osseous finding.  IMPRESSION: Cardiomegaly with minimal interstitial edema.  Improving bibasilar atelectasis   Electronically Signed   By: Jerilynn Mages.  Shick M.D.   On: 04/21/2015 07:54   Dg Chest Port  1 View  04/20/2015   CLINICAL DATA:  Acute respiratory failure.  EXAM: PORTABLE CHEST - 1 VIEW  COMPARISON:  04/19/2015.  FINDINGS: Endotracheal tube and NG tube in good anatomic position. Cardiomegaly with interim significant clearing of pulmonary infiltrates consistent with clearing pulmonary edema. Mild to moderate moderate residual. No pleural effusion or pneumothorax. Left costophrenic angle not imaged.  IMPRESSION: 1. Lines and tubes in stable position. 2. Partial clearing of congestive heart failure and pulmonary edema.   Electronically Signed   By: Marcello Moores  Register   On: 04/20/2015  08:03    Medications:  Scheduled: . ampicillin-sulbactam (UNASYN) IV  3 g Intravenous Q8H  . antiseptic oral rinse  7 mL Mouth Rinse QID  . aspirin  300 mg Rectal Daily  . chlorhexidine  15 mL Mouth Rinse BID  . famotidine (PEPCID) IV  20 mg Intravenous Q12H  . heparin  5,000 Units Subcutaneous 3 times per day  . insulin aspart  0-15 Units Subcutaneous 6 times per day  . levETIRAcetam  500 mg Intravenous Q12H    Assessment/Plan: 74 YO male with AMS and possible seizure in setting of hypoglycemia (BG 33). EEG at time of arrival and on 04/20/2015 during episode of increased tone and posturing showed no epileptiform activity. Extubated and currently encephalopathic but improving significantly on a daily basis.  Suspect AMS likely secondary to metabolic encephalopathy with prolonged hypoglycemia. At this time will D/C Keppra as he had no electrographic seizure activity and MRI was normal. Will follow on an as needed basis following this visit. Please call with questions.    Etta Quill PA-C [facility] [phone]  04/21/2015, 9:06 AM

## 2015-04-21 NOTE — Progress Notes (Signed)
ANTIBIOTIC CONSULT NOTE - Follow-up  Pharmacy Consult for Unasyn  Indication: Aspiration PNA  Allergies  Allergen Reactions  . Bee Venom Anaphylaxis  . Shellfish Allergy Anaphylaxis    Patient Measurements: Height: 5\' 8"  (172.7 cm) Weight: 182 lb 5.1 oz (82.7 kg) IBW/kg (Calculated) : 68.4  Vital Signs: Temp: 98.6 F (37 C) (07/08 1156) Temp Source: Oral (07/08 1156) BP: 112/58 mmHg (07/08 1100) Pulse Rate: 92 (07/08 0900) Intake/Output from previous day: 07/07 0701 - 07/08 0700 In: 1118.7 [I.V.:318.7; IV Piggyback:800] Out: 1005 [Urine:1005] Intake/Output from this shift: Total I/O In: 195 [I.V.:40; IV Piggyback:155] Out: -   Labs:  Recent Labs  04/19/15 0430 04/20/15 0209 04/21/15 0211  WBC 11.2* 7.9 5.8  HGB 11.7* 10.0* 9.1*  PLT 260 179 155  CREATININE 0.85 0.83 0.77   Estimated Creatinine Clearance: 86.2 mL/min (by C-G formula based on Cr of 0.77).  Assessment: 74 y/o M found unresponsive PTA, initiated on unasyn for possible aspiration pneumonia. Pt is afebrile and WBC is WNL. Scr has been stable throughout his admission. MD has added stop date. No culture data available.   Unasyn 7/6>>(7/11)  Plan:  - Continue unasyn 3gm IV Q8H - F/u renal fxn, C&S, clinical status  *Pharmacy will sign-off as no dose adjustments are anticipated and a stop date is in place. Please re-consult pharmacy if necessary. Thank you for the consult!  Salome Arnt, PharmD, BCPS Pager # [phone] 04/21/2015 12:41 PM

## 2015-04-21 NOTE — Progress Notes (Signed)
PULMONARY / CRITICAL CARE MEDICINE   Name: Dale Morrow MRN: [id number] DOB: 08-10-1941    ADMISSION DATE:  04/19/2015  CHIEF COMPLAINT:  AMS  INITIAL PRESENTATION: 74yo male PMH of DM and prostate CA presented to the ED after being found unresponsive by his wife. Last seen normal around 3:30pm. Initial CGB of 33. Glucose responded well to D50. Rigid and posturing noted. Agitated and moaning in ED. CT Head obtained -- clear. Neurology consulted. Intubated in ED  STUDIES:  CT Head 7/6 - no acute changes CXR 7/6 - opacification of Rt chest; pulm edema 7/6 echo - Pa 40, 25% to 30%. There is akinesis of the mid-apicalanteroseptal, anterolateral, inferolateral, inferoseptal, andapical myocardium. Basal sparing (? Takotsubo) No evidence thrombus. Mri brani 7/6>>>atrophy, neg acute eeg 7/6>>>no focus 7/7- extubated, agitation "you are a bunch of bitches"  SIGNIFICANT EVENTS: Intubation in ED 7/6 >>continuous generalized moderately severe nonspecific slowing of cerebral activity, as seen on previous study. No evidence of seizure   SUBJECTIVE: no distress  VITAL SIGNS: Temp:  [98.4 F (36.9 C)-99.6 F (37.6 C)] 98.8 F (37.1 C) (07/08 0800) Pulse Rate:  [81-118] 92 (07/08 0900) Resp:  [10-29] 13 (07/08 0900) BP: (87-118)/(39-68) 118/65 mmHg (07/08 0900) SpO2:  [95 %-100 %] 97 % (07/08 0900) FiO2 (%):  [40 %] 40 % (07/07 1800) Weight:  [82.7 kg (182 lb 5.1 oz)] 82.7 kg (182 lb 5.1 oz) (07/08 0400) HEMODYNAMICS:   VENTILATOR SETTINGS: Vent Mode:  [-]  FiO2 (%):  [40 %] 40 % INTAKE / OUTPUT:  Intake/Output Summary (Last 24 hours) at 04/21/15 1028 Last data filed at 04/21/15 0900  Gross per 24 hour  Intake   1125 ml  Output    905 ml  Net    220 ml    PHYSICAL EXAMINATION: General -- sedated, rass 0 HEENT -- perrl 2 reactive Neck -- supple  Chest -- CTA Cardiac -- RRR. No murmurs noted.  s1 s 2 Abdomen -- soft, nontender. No masses palpable. Bowel sounds present CNS --  alert, nonfocal, not rigid Extremeties - full passive ROM throughout. Not rigid  LABS:  CBC  Recent Labs Lab 04/19/15 0430 04/20/15 0209 04/21/15 0211  WBC 11.2* 7.9 5.8  HGB 11.7* 10.0* 9.1*  HCT 36.0* 31.6* 28.3*  PLT 260 179 155   Coag's  Recent Labs Lab 04/19/15 0051  APTT 30  INR 1.25   BMET  Recent Labs Lab 04/19/15 0430 04/20/15 0209 04/21/15 0211  NA 137 138 139  K 5.1 3.7 3.3*  CL 104 108 108  CO2 21* 23 23  BUN 20 12 8   CREATININE 0.85 0.83 0.77  GLUCOSE 183* 177* 176*   Electrolytes  Recent Labs Lab 04/19/15 0430 04/20/15 0209 04/21/15 0211  CALCIUM 8.3* 7.7* 7.6*  MG 2.2  --   --   PHOS 2.4*  --   --    Sepsis Markers  Recent Labs Lab 04/19/15 0059 04/19/15 0430 04/20/15 0209  LATICACIDVEN 10.20* 3.6* 1.6  PROCALCITON  --  <0.10  --    ABG  Recent Labs Lab 04/19/15 0234 04/19/15 0350  PHART 7.286* 7.378  PCO2ART 44.9 31.6*  PO2ART 241.0* 379*   Liver Enzymes  Recent Labs Lab 04/19/15 0051 04/21/15 0211  AST 50* 61*  ALT 17 19  ALKPHOS 71 46  BILITOT 0.4 0.5  ALBUMIN 3.2* 2.7*   Cardiac Enzymes  Recent Labs Lab 04/20/15 1306  TROPONINI 1.01*   Glucose  Recent Labs Lab 04/20/15 1221  04/20/15 1631 04/20/15 1948 04/20/15 2325 04/21/15 0318 04/21/15 0822  GLUCAP 245* 267* 137* 92 196* 225*    Imaging Dg Chest Port 1 View  04/21/2015   CLINICAL DATA:  Pneumonia, extubated  EXAM: PORTABLE CHEST - 1 VIEW  COMPARISON:  04/20/2015  FINDINGS: Stable mild cardiomegaly and minimal interstitial edema pattern throughout the lungs. Improving bibasilar atelectasis. No effusion or pneumothorax. Trachea midline. No acute osseous finding.  IMPRESSION: Cardiomegaly with minimal interstitial edema.  Improving bibasilar atelectasis   Electronically Signed   By: Jerilynn Mages.  Shick M.D.   On: 04/21/2015 07:54     ASSESSMENT / PLAN:  PULMONARY OETT 7/6 >> A: vent dependant resp failure 2/2 suspected seizure activity, diffuse rt  infiltrate improved, r/o asp P:   IS  CARDIOVASCULAR A: r/o TAkasubu, CHF noted, r/o ischemia P:  Cardiology consult - echo repeat in 2 weeks Tele remain Asa   RENAL A:  AG metabolic acidosis (2/2 Lactate/ketoacidosis?) Lactic acidosis (2/2 seizure activity?) Elevated CK (2/2 seizure activity) hypoK  P:   k supp Tele remain bmet in am   GASTROINTESTINAL A:  Monitor for signs of constipation or CDiff P:   Stool softener if necessary Diet fulls then advance  HEMATOLOGIC A:  CT - no signs of bleeds Hgb at baseline P:  SQ Hep for DVT proph until ambulation pt Asa   INFECTIOUS A:  Suspect aspiration PNA Recent total knee arthroplasty P:   Abx: Unasyn, start date 7/6 >> Monitor knee for signs of infection  Maintain current abx x 5 days  ENDOCRINE A:  DM Type 1; Likely hypoglycemic seizure as primary cause of AMS P:   SSI =NO long acting yet as low 95 noted Consider long acting when diet started and rise over 220 x 2  NEUROLOGIC A:  Suspect complex partial status epilepticus 0 secondary to low glu P:   Neurology SO keppra dc PT   FAMILY  - Updates: Wife updated by DF  - Inter-disciplinary family meet or Palliative Care meeting due by:  04/27/15   To triad, med floor, sitter  Lavon Paganini. Titus Mould, MD, [place] Pgr: [facility]

## 2015-04-21 NOTE — Progress Notes (Signed)
Patient Name: Dale Morrow Date of Encounter: 04/21/2015    Active Problems:   Status epilepticus   Altered consciousness   Acute respiratory failure   Seizure-like activity   Acute encephalopathy   Altered mental status    SUBJECTIVE  Denies any CP or SOB.   CURRENT MEDS . ampicillin-sulbactam (UNASYN) IV  3 g Intravenous Q8H  . antiseptic oral rinse  7 mL Mouth Rinse QID  . aspirin  300 mg Rectal Daily  . chlorhexidine  15 mL Mouth Rinse BID  . famotidine (PEPCID) IV  20 mg Intravenous Q12H  . heparin  5,000 Units Subcutaneous 3 times per day  . insulin aspart  0-15 Units Subcutaneous 6 times per day    OBJECTIVE  Filed Vitals:   04/21/15 0600 04/21/15 0700 04/21/15 0800 04/21/15 0900  BP: 115/63 111/51 116/67 118/65  Pulse: 83 81 89 92  Temp:   98.8 F (37.1 C)   TempSrc:   Oral   Resp: 11 14 10 13   Height:      Weight:      SpO2: 98% 96% 95% 97%    Intake/Output Summary (Last 24 hours) at 04/21/15 1102 Last data filed at 04/21/15 0900  Gross per 24 hour  Intake   1065 ml  Output    905 ml  Net    160 ml   Filed Weights   04/20/15 0000 04/20/15 0500 04/21/15 0400  Weight: 183 lb 6.8 oz (83.2 kg) 183 lb 6.8 oz (83.2 kg) 182 lb 5.1 oz (82.7 kg)    PHYSICAL EXAM  General: Did not talk much, only respond to some question Neuro: Moves all extremities spontaneously. Alert, but not sure about his orientation as he refuse to answer some questions. Psych: Normal affect. HEENT:  Normal  Neck: Supple without bruits +mild JVD. Lungs:  Resp regular and unlabored, anterior exam CTA. Heart: RRR no s3, s4, or murmurs. Abdomen: Soft, non-tender, non-distended, BS + x 4.  Extremities: No clubbing, cyanosis or edema. DP/PT/Radials 2+ and equal bilaterally.  Accessory Clinical Findings  CBC  Recent Labs  04/19/15 0051  04/20/15 0209 04/21/15 0211  WBC 11.8*  < > 7.9 5.8  NEUTROABS 10.2*  --   --  4.1  HGB 11.9*  < > 10.0* 9.1*  HCT 37.1*  < > 31.6*  28.3*  MCV 87.5  < > 87.3 86.8  PLT 307  < > 179 155  < > = values in this interval not displayed. Basic Metabolic Panel  Recent Labs  04/19/15 0430 04/20/15 0209 04/21/15 0211  NA 137 138 139  K 5.1 3.7 3.3*  CL 104 108 108  CO2 21* 23 23  GLUCOSE 183* 177* 176*  BUN 20 12 8   CREATININE 0.85 0.83 0.77  CALCIUM 8.3* 7.7* 7.6*  MG 2.2  --   --   PHOS 2.4*  --   --    Liver Function Tests  Recent Labs  04/19/15 0051 04/21/15 0211  AST 50* 61*  ALT 17 19  ALKPHOS 71 46  BILITOT 0.4 0.5  PROT 6.7 5.4*  ALBUMIN 3.2* 2.7*   Cardiac Enzymes  Recent Labs  04/19/15 0130 04/20/15 0209 04/20/15 1306  CKTOTAL 490* 378  --   TROPONINI  --   --  1.01*   Fasting Lipid Panel  Recent Labs  04/19/15 0430  TRIG 63   Thyroid Function Tests  Recent Labs  04/20/15 1018  TSH 0.551    TELE NSR  with HR 100s yesterday, HR improved today    ECG  No new EKG  Echocardiogram 04/19/2015  Left ventricle: The cavity size was normal. Systolic function was severely reduced. The estimated ejection fraction was in the range of 25% to 30%. There is akinesis of the mid-apical anteroseptal, anterolateral, inferolateral, inferoseptal, and apical myocardium. Basal sparing (? Takotsubo) No evidence of thrombus. - Pulmonary arteries: Systolic pressure was mildly increased. PA peak pressure: 40 mm Hg (S).     Radiology/Studies  Ct Head Wo Contrast  04/19/2015   CLINICAL DATA:  Found down and unresponsive.  EXAM: CT HEAD WITHOUT CONTRAST  CT CERVICAL SPINE WITHOUT CONTRAST  TECHNIQUE: Multidetector CT imaging of the head and cervical spine was performed following the standard protocol without intravenous contrast. Multiplanar CT image reconstructions of the cervical spine were also generated.  COMPARISON:  None.  FINDINGS: CT HEAD FINDINGS  Skull and Sinuses:Negative for fracture or destructive process. The mastoids, middle ears, and imaged paranasal sinuses are clear.   Orbits: Bilateral cataract resection.  Brain: No evidence of acute infarction, hemorrhage, hydrocephalus, or mass lesion/mass effect. Generalized cerebral volume loss.  CT CERVICAL SPINE FINDINGS  Negative for acute fracture or subluxation. No prevertebral edema. No gross cervical canal hematoma. Multilevel degenerative disc narrowing and facet arthropathy. No significant osseous canal or foraminal stenosis. No endplate erosion or focal bone lesion.  Interlobular septal thickening at the apices.  IMPRESSION: 1. No acute intracranial or cervical spine findings. 2. Pulmonary edema.   Electronically Signed   By: Monte Fantasia M.D.   On: 04/19/2015 01:22   Ct Cervical Spine Wo Contrast  04/19/2015   CLINICAL DATA:  Found down and unresponsive.  EXAM: CT HEAD WITHOUT CONTRAST  CT CERVICAL SPINE WITHOUT CONTRAST  TECHNIQUE: Multidetector CT imaging of the head and cervical spine was performed following the standard protocol without intravenous contrast. Multiplanar CT image reconstructions of the cervical spine were also generated.  COMPARISON:  None.  FINDINGS: CT HEAD FINDINGS  Skull and Sinuses:Negative for fracture or destructive process. The mastoids, middle ears, and imaged paranasal sinuses are clear.  Orbits: Bilateral cataract resection.  Brain: No evidence of acute infarction, hemorrhage, hydrocephalus, or mass lesion/mass effect. Generalized cerebral volume loss.  CT CERVICAL SPINE FINDINGS  Negative for acute fracture or subluxation. No prevertebral edema. No gross cervical canal hematoma. Multilevel degenerative disc narrowing and facet arthropathy. No significant osseous canal or foraminal stenosis. No endplate erosion or focal bone lesion.  Interlobular septal thickening at the apices.  IMPRESSION: 1. No acute intracranial or cervical spine findings. 2. Pulmonary edema.   Electronically Signed   By: Monte Fantasia M.D.   On: 04/19/2015 01:22   Mr Brain Wo Contrast  04/19/2015   CLINICAL DATA:   Altered mental status.  Unresponsive.  EXAM: MRI HEAD WITHOUT CONTRAST  TECHNIQUE: Multiplanar, multiecho pulse sequences of the brain and surrounding structures were obtained without intravenous contrast.  COMPARISON:  CT head 04/19/2015  FINDINGS: Mild to moderate atrophy. Mild ventricular enlargement consistent with the level of atrophy.  Negative for acute infarct. Minimal chronic microvascular ischemic change in the white matter.  Pituitary normal in size. Bilateral lens replacement. No orbital mass. Paranasal sinuses clear.  Negative for intracranial hemorrhage or fluid collection.  Negative for mass or edema.  No shift of the midline structures.  IMPRESSION: Generalized atrophy.  Minimal chronic microvascular ischemia.  No acute abnormality.   Electronically Signed   By: Franchot Gallo M.D.   On: 04/19/2015 12:37  US Abdomen Complete  03/29/2015   CLINICAL DATA:  Generalized abdominal pain  EXAM: ULTRASOUND ABDOMEN COMPLETE  COMPARISON:  CT abdomen and pelvis Mar 10, 2015  FINDINGS: Gallbladder: No gallstones or wall thickening visualized. There is no pericholecystic fluid. No sonographic Murphy sign noted.  Common bile duct: Diameter: 5 mm. There is no intrahepatic, common hepatic, or common bile duct dilatation.  Liver: No focal lesion identified. Within normal limits in parenchymal echogenicity. A tiny cyst near the dome of the liver seen on CT is not appreciable on this ultrasound examination.  IVC: No abnormality visualized.  Pancreas: No mass or inflammatory focus.  Spleen: Size and appearance within normal limits.  Right Kidney: Length: 10.7 cm. Echogenicity within normal limits. No mass or hydronephrosis visualized.  Left Kidney: Length: 10.8 cm. Echogenicity within normal limits. No mass or hydronephrosis visualized.  Abdominal aorta: No aneurysm visualized.  Other findings: No demonstrable ascites.  IMPRESSION: Study within normal limits.   Electronically Signed   By: Lowella Grip III M.D.    On: 03/29/2015 08:43   Dg Chest Port 1 View  04/21/2015   CLINICAL DATA:  Pneumonia, extubated  EXAM: PORTABLE CHEST - 1 VIEW  COMPARISON:  04/20/2015  FINDINGS: Stable mild cardiomegaly and minimal interstitial edema pattern throughout the lungs. Improving bibasilar atelectasis. No effusion or pneumothorax. Trachea midline. No acute osseous finding.  IMPRESSION: Cardiomegaly with minimal interstitial edema.  Improving bibasilar atelectasis   Electronically Signed   By: Jerilynn Mages.  Shick M.D.   On: 04/21/2015 07:54   Dg Chest Port 1 View  04/20/2015   CLINICAL DATA:  Acute respiratory failure.  EXAM: PORTABLE CHEST - 1 VIEW  COMPARISON:  04/19/2015.  FINDINGS: Endotracheal tube and NG tube in good anatomic position. Cardiomegaly with interim significant clearing of pulmonary infiltrates consistent with clearing pulmonary edema. Mild to moderate moderate residual. No pleural effusion or pneumothorax. Left costophrenic angle not imaged.  IMPRESSION: 1. Lines and tubes in stable position. 2. Partial clearing of congestive heart failure and pulmonary edema.   Electronically Signed   By: Marcello Moores  Register   On: 04/20/2015 08:03   Dg Chest Port 1 View  04/19/2015   CLINICAL DATA:  Endotracheal tube placement  EXAM: PORTABLE CHEST - 1 VIEW  COMPARISON:  03/13/1999  FINDINGS: Endotracheal tube with tip between the clavicular heads and carina. The orogastric tube reaches the stomach at least.  There is interlobular septal thickening and Kerley lines consistent with pulmonary edema, also seen on the contemporaneous cervical spine CT. Opacity is asymmetric to the right. Normal heart size and mediastinal contours.  IMPRESSION: 1. Endotracheal and orogastric tubes are in good position. 2. Pulmonary edema. 3. Asymmetric opacification of the right chest could be from asymmetric edema or superimposed pneumonia/aspiration.   Electronically Signed   By: Monte Fantasia M.D.   On: 04/19/2015 01:59    ASSESSMENT AND PLAN  74 yo male  with DM, prostate CA, recent knee surgery in 01/2015 present to [facility] after found unresponsive by wife. Has lactic acidosis. Initial blood glucose 33. EEG performed negative for seizure like activity. Echo 04/19/2015 EF 25% with characteristics of takotsubo cardiomyoapthy  1. Newly discovered cardiomyoopathy  - elevated trop, but occurred in the setting of acidosis, AMS, hypoglycemic event  - Echo 04/19/2015 EF 25-30%, akinesis of mid-apical anteroseptal, anterolateral, inferolateral, inferoseptal and apical myodium, with basal sparing. characteristics of takotsubo cardiomyoapthy  - Hopefull, if it is truly Takotsubo's should see some improvement in EF after 7-14 days, potentially  do myoview prior to discharge.  - add captopril 6.25 TID  2. Possible acute systolic HF on CXR, no SOB: if has sob, will add lasix  3. Encephalopathy  4. Metabolic acidosis  5. Anemia: hgb dropped from 13.6 two days ago to 9.1, unclear cause, drop higher than expected from hydration  Signed, Almyra Deforest PA-C Pager: [phone] As above, patient seen and examined. Patient was admitted with encephalopathy. Echocardiogram showed reduced LV function and possible takotsubo CM. Troponin elevated. No chest pain. Plan to add captopril 6.25 mg by mouth 3 times a day. If his blood pressure tolerates will transition to lisinopril tomorrow morning. Add low-dose carvedilol later if blood pressure allows. Continue ASA. He will need a repeat echocardiogram in approximately 2 weeks to see if LV function has normalized. Given mildly abnormal troponin, LV dysfunction and long history of diabetes mellitus will arrange nuclear study for Sunday or Monday for risk stratification. Kirk Ruths

## 2015-04-21 NOTE — Progress Notes (Signed)
[place] Progress Note Patient Name: Dale Morrow DOB: 10-Oct-1941 MRN: [id number]   Date of Service  04/21/2015  HPI/Events of Note  Patient with h/o of knee replacement now with significant knee pain not responding to IV fentanyl 50 mcg.  Patient is more awake now.  Was on oxy/tylenol at home.  eICU Interventions  Plan: Tylenol for now To be evaluated at bedside by rounding team     Intervention Category Intermediate Interventions: Pain - evaluation and management  Augusta Hilbert 04/21/2015, 6:59 AM

## 2015-04-21 NOTE — Progress Notes (Signed)
Utilization Review completed. Chaos Carlile RN BSN CM 

## 2015-04-21 NOTE — Progress Notes (Signed)
[place] Progress Note Patient Name: Dale Morrow DOB: 24-Jan-1941 MRN: [id number]   Date of Service  04/21/2015  HPI/Events of Note  Hypokalemia  eICU Interventions  Potassium replaced     Intervention Category Intermediate Interventions: Electrolyte abnormality - evaluation and management  DETERDING,ELIZABETH 04/21/2015, 2:56 AM

## 2015-04-21 NOTE — Progress Notes (Signed)
Inpatient Diabetes Program Recommendations  AACE/ADA: New Consensus Statement on Inpatient Glycemic Control (2013)  Target Ranges:  Prepandial:   less than 140 mg/dL      Peak postprandial:   less than 180 mg/dL (1-2 hours)      Critically ill patients:  140 - 180 mg/dL   Inpatient Diabetes Program Recommendations Insulin - Basal: add Lantus or Levemir 10 units daily Thank you  Raoul Pitch BSN, RN,CDE Inpatient Diabetes Coordinator [phone] (team pager)

## 2015-04-22 DIAGNOSIS — J9601 Acute respiratory failure with hypoxia: Secondary | ICD-10-CM

## 2015-04-22 DIAGNOSIS — J690 Pneumonitis due to inhalation of food and vomit: Secondary | ICD-10-CM

## 2015-04-22 LAB — GLUCOSE, CAPILLARY
GLUCOSE-CAPILLARY: 309 mg/dL — AB (ref 65–99)
GLUCOSE-CAPILLARY: 277 mg/dL — AB (ref 65–99)
Glucose-Capillary: 144 mg/dL — ABNORMAL HIGH (ref 65–99)
Glucose-Capillary: 220 mg/dL — ABNORMAL HIGH (ref 65–99)
Glucose-Capillary: 243 mg/dL — ABNORMAL HIGH (ref 65–99)

## 2015-04-22 LAB — BASIC METABOLIC PANEL
Anion gap: 7 (ref 5–15)
BUN: 9 mg/dL (ref 6–20)
CALCIUM: 7.8 mg/dL — AB (ref 8.9–10.3)
CHLORIDE: 107 mmol/L (ref 101–111)
CO2: 25 mmol/L (ref 22–32)
CREATININE: 0.63 mg/dL (ref 0.61–1.24)
GFR calc non Af Amer: >60 mL/min (ref >60)
Glucose, Bld: 157 mg/dL — ABNORMAL HIGH (ref 65–99)
Potassium: 3.2 mmol/L — ABNORMAL LOW (ref 3.5–5.1)
Sodium: 139 mmol/L (ref 135–145)

## 2015-04-22 LAB — VITAMIN B12: Vitamin B-12: 850 pg/mL (ref 180–914)

## 2015-04-22 LAB — MAGNESIUM: MAGNESIUM: 1.9 mg/dL (ref 1.7–2.4)

## 2015-04-22 LAB — AMMONIA: AMMONIA: 33 umol/L (ref 9–35)

## 2015-04-22 MED ORDER — HYDROCODONE-ACETAMINOPHEN 5-325 MG PO TABS
1.0000 | ORAL_TABLET | Freq: Four times a day (QID) | ORAL | Status: DC | PRN
  Administered 2015-04-22 – 2015-04-25 (×3): 1 via ORAL
  Filled 2015-04-22 (×4): qty 1

## 2015-04-22 MED ORDER — INSULIN ASPART 100 UNIT/ML ~~LOC~~ SOLN
0.0000 [IU] | Freq: Three times a day (TID) | SUBCUTANEOUS | Status: DC
  Administered 2015-04-22: 3 [IU] via SUBCUTANEOUS
  Administered 2015-04-22: 7 [IU] via SUBCUTANEOUS
  Administered 2015-04-23: 9 [IU] via SUBCUTANEOUS
  Administered 2015-04-23: 5 [IU] via SUBCUTANEOUS

## 2015-04-22 MED ORDER — LISINOPRIL 5 MG PO TABS
5.0000 mg | ORAL_TABLET | Freq: Two times a day (BID) | ORAL | Status: DC
  Administered 2015-04-23 – 2015-04-25 (×6): 5 mg via ORAL
  Filled 2015-04-22 (×9): qty 1

## 2015-04-22 MED ORDER — POTASSIUM CHLORIDE CRYS ER 20 MEQ PO TBCR
40.0000 meq | EXTENDED_RELEASE_TABLET | Freq: Once | ORAL | Status: AC
  Administered 2015-04-22: 40 meq via ORAL
  Filled 2015-04-22: qty 2

## 2015-04-22 MED ORDER — PANTOPRAZOLE SODIUM 40 MG PO TBEC
40.0000 mg | DELAYED_RELEASE_TABLET | Freq: Every day | ORAL | Status: DC
  Administered 2015-04-22 – 2015-04-25 (×4): 40 mg via ORAL
  Filled 2015-04-22 (×2): qty 1
  Filled 2015-04-22: qty 2

## 2015-04-22 MED ORDER — INSULIN GLARGINE 100 UNIT/ML ~~LOC~~ SOLN
10.0000 [IU] | Freq: Every day | SUBCUTANEOUS | Status: DC
  Administered 2015-04-22: 10 [IU] via SUBCUTANEOUS
  Filled 2015-04-22 (×2): qty 0.1

## 2015-04-22 MED ORDER — INSULIN ASPART 100 UNIT/ML ~~LOC~~ SOLN
0.0000 [IU] | Freq: Every day | SUBCUTANEOUS | Status: DC
Start: 2015-04-22 — End: 2015-04-23
  Administered 2015-04-22: 3 [IU] via SUBCUTANEOUS

## 2015-04-22 NOTE — Evaluation (Signed)
Physical Therapy Evaluation Patient Details Name: Dale Morrow MRN: [id number] DOB: 10/17/1940 Today's Date: 04/22/2015   History of Present Illness  74 yo male with onset of seizures pre-admit and CHF with cardiomyopathy,   Clinical Impression  Pt was seen for evaluation of his mobility with unsafe performance and likelihood of fall is high.  Will need to send to SNF for strengthening as he is able to tolerate, and will reintegrate him to home as he is capable of handling.  His current unsafe standing and gait performance is going to be focus of inpt therapy and will need to check with wife about what equipment he already owns.    Follow Up Recommendations SNF    Equipment Recommendations  None recommended by PT (Not sure what equipment pt owns)    Recommendations for Other Services       Precautions / Restrictions Precautions Precautions: Fall (telemetry) Restrictions Weight Bearing Restrictions: No      Mobility  Bed Mobility Overal bed mobility: Modified Independent             General bed mobility comments: HOB elevated,   Transfers Overall transfer level: Needs assistance Equipment used: Rolling walker (2 wheeled);1 person hand held assist Transfers: Sit to/from Stand Sit to Stand: Min assist;Mod assist         General transfer comment: reminders for hand placement  Ambulation/Gait Ambulation/Gait assistance: Min assist Ambulation Distance (Feet): 50 Feet Assistive device: Rolling walker (2 wheeled);1 person hand held assist Gait Pattern/deviations: Step-through pattern;Decreased step length - right;Decreased step length - left;Wide base of support;Trunk flexed Gait velocity: reduced Gait velocity interpretation: Below normal speed for age/gender General Gait Details: short steps with slow pace, flexed posture and stays in his walker space to move  Stairs            Wheelchair Mobility    Modified Rankin (Stroke Patients Only)        Balance Overall balance assessment: Needs assistance Sitting-balance support: Feet supported Sitting balance-Leahy Scale: Good   Postural control: Posterior lean Standing balance support: Bilateral upper extremity supported Standing balance-Leahy Scale: Fair Standing balance comment: fair- dynamic                             Pertinent Vitals/Pain Pain Assessment: No/denies pain    Home Living Family/patient expects to be discharged to:: Private residence Living Arrangements: Spouse/significant other Available Help at Discharge: Family Type of Home: House Home Access: Stairs to enter   Technical brewer of Steps: 3 Home Layout: Two level;Bed/bath upstairs;1/2 bath on main level   Additional Comments: Pt is poor historian and cannot completely get details from him    Prior Function Level of Independence: Independent with assistive device(s)         Comments: Per pt     Hand Dominance        Extremity/Trunk Assessment   Upper Extremity Assessment: Overall WFL for tasks assessed           Lower Extremity Assessment: Generalized weakness      Cervical / Trunk Assessment: Normal  Communication   Communication: No difficulties;Other (comment) (confusion)  Cognition Arousal/Alertness: Awake/alert Behavior During Therapy: Anxious;Impulsive Overall Cognitive Status: No family/caregiver present to determine baseline cognitive functioning       Memory: Decreased recall of precautions;Decreased short-term memory              General Comments General comments (skin integrity, edema, etc.): Pt  is impulsive and struggling to recall instructions given.  His plan of care is to have SNF placement if possible due to debility and fall risk of his situation    Exercises        Assessment/Plan    PT Assessment Patient needs continued PT services  PT Diagnosis Generalized weakness;Abnormality of gait   PT Problem List Decreased  strength;Decreased range of motion;Decreased activity tolerance;Decreased balance;Decreased mobility;Decreased coordination;Decreased cognition;Decreased knowledge of use of DME;Decreased safety awareness;Decreased knowledge of precautions;Cardiopulmonary status limiting activity;Obesity  PT Treatment Interventions DME instruction;Gait training;Stair training;Functional mobility training;Therapeutic activities;Therapeutic exercise;Balance training;Neuromuscular re-education;Cognitive remediation;Patient/family education   PT Goals (Current goals can be found in the Care Plan section) Acute Rehab PT Goals Patient Stated Goal: to go home PT Goal Formulation: With patient Time For Goal Achievement: 05/06/15 Potential to Achieve Goals: Good    Frequency Min 2X/week   Barriers to discharge Inaccessible home environment;Decreased caregiver support (wife works)      Co-evaluation               End of Session   Activity Tolerance: Patient tolerated treatment well             Time: 0156-1537 PT Time Calculation (min) (ACUTE ONLY): 28 min   Charges:   PT Evaluation $Initial PT Evaluation Tier I: 1 Procedure PT Treatments $Gait Training: 8-22 mins   PT G CodesRamond Dial May 10, 2015, 4:25 PM   Mee Hives, PT MS Acute Rehab Dept. Number: [facility] [id number] and [place] [phone]

## 2015-04-22 NOTE — Progress Notes (Signed)
Subjective:  He is talkative and argumentative today.  Wants to know when he can go home.  Not completely sure if he is oriented.  Objective:  Vital Signs in the last 24 hours: BP 119/75 mmHg  Pulse 100  Temp(Src) 98.6 F (37 C) (Oral)  Resp 18  Ht 5\' 8"  (1.727 m)  Wt 85.4 kg (188 lb 4.4 oz)  BMI 28.63 kg/m2  SpO2 100%  Physical Exam: Obese male somewhat agitated  Lungs:  Clear Cardiac:  Regular rhythm, normal S1 and S2, no S3 Abdomen:  Soft, nontender, no masses Extremities:  No edema present  Intake/Output from previous day: 07/08 0701 - 07/09 0700 In: 655 [P.O.:240; I.V.:60; IV Piggyback:355] Out: 450 [Urine:450]  Weight Filed Weights   04/20/15 0500 04/21/15 0400 04/22/15 0438  Weight: 83.2 kg (183 lb 6.8 oz) 82.7 kg (182 lb 5.1 oz) 85.4 kg (188 lb 4.4 oz)    Lab Results: Basic Metabolic Panel:  Recent Labs  04/21/15 0211 04/22/15 0444  NA 139 139  K 3.3* 3.2*  CL 108 107  CO2 23 25  GLUCOSE 176* 157*  BUN 8 9  CREATININE 0.77 0.63   CBC:  Recent Labs  04/20/15 0209 04/21/15 0211  WBC 7.9 5.8  NEUTROABS  --  4.1  HGB 10.0* 9.1*  HCT 31.6* 28.3*  MCV 87.3 86.8  PLT 179 155   Cardiac Enzymes:  Cardiac Panel (last 3 results)  Recent Labs  04/20/15 0209 04/20/15 1306  CKTOTAL 378  --   TROPONINI  --  1.01*    Telemetry: Sinus rhythm  Assessment/Plan:  1.  Cardiomyopathy type undetermined-favor Takasubo but with diabetes could be ischemic also. 2.  Delirium and some agitation 3.  Mild anemia 4.  Acute systolic heart failure without dyspnea  Recommendations:  Will change to lisinopril as blood pressure is tolerating it well.  Try low-dose carvedilol.  With his mental status would prefer to wait until Monday for stress test.     W. Doristine Church  MD [facility]  04/22/2015, 12:43 PM

## 2015-04-22 NOTE — Progress Notes (Signed)
[facility] Progress Note Patient Name: Dale Morrow DOB: 1941/07/06 MRN: [id number]   Date of Service  04/22/2015  HPI/Events of Note  Patient c/o of knee pain not controlled with tylenol. On home oxy/tylenol  eICU Interventions  Plan: Norco 5/325 mg po q6 hours prn pain     Intervention Category Intermediate Interventions: Pain - evaluation and management  DETERDING,ELIZABETH 04/22/2015, 12:31 AM

## 2015-04-22 NOTE — Progress Notes (Signed)
PROGRESS NOTE  Dale Morrow MHD:[id number] DOB: October 05, 1941 DOA: 04/19/2015 PCP: Jonathon Bellows, MD  Brief history 74 year old male with a history of diabetes mellitus, prostate cancer (s/p XRT), depression presented to the emergency department after being found unresponsive by his wife at 11:45 PM on 04/18/2015. The patient was last seen normal around 3:30 PM on 04/18/2015. He was noted to be rigid by EMS. Initial CBG was 3:30 and responded well to D50. He was agitated in the emergency department, and he was noted to be posturing.. CT of the brain was unremarkable. The patient was intubated in the emergency department. He was noted to be hypertensive with systolic blood pressure in the 200s initially. Neurology was consulted due to suspected seizure activity. Assessment/Plan: Acute respiratory failure -Thought to be secondary to seizure activity and pulmonary edema - extubated on 04/20/2015 -presently stable without increased WOB Elevated troponin -Likely due to demand ischemia - cardiology was consulted after  Echocardiogram revealed ejection fraction of 25% - EKG without any concerning ischemic changes Nonischemic cardiomyopathy - appreciate cardiology consultation - Thought to be Takotsubo cardiomyopathy  -Echo 04/19/2015 EF 25-30%, akinesis of mid-apical anteroseptal, anterolateral, inferolateral, inferoseptal and apical myodium, with basal sparing. characteristics of takotsubo cardiomyoapthy - captopril 6.25 mg 3 times a day was added as well as carvedilol - plan to repeat echocardiogram in 2 weeks - Lexiscan planned July 10 or  04/24/2015 Seizure Like Activity/ Acute encephalopathy -multifactorial including hypoglycemia, cardiomyopathy and hyperammonemia -Neurology was consulted -Initial EEG is consistent with a generalized non-specific cerebral dysfunction(encephalopathy) without seizure predisposition - patient continued to have increase tone and rigidity - Repeat EEG  04/20/2015 did not show any epileptiform discharges during an episode of increased tone and posturing - Suspected encephalopathy was likely metabolic in nature secondary to prolonged hypoglycemia - Keppra was subsequently discontinued - MRI brain negative for acute findings -check B12 -TSH--0.551 Aspiration pneumonia - continue Unasyn D#4 -stable on RA Hyerammonemia -if remains elevated, then further investigations warranted -recheck Metabolic acidosis -Question whether this was related to the patient's seizure activity -Patient had elevated CPK -resolved Diabetes mellitus type 1 -Patient had ketonuria initially, but did not have hyperglycemia Hypokalemia - replete - check magnesium   Family Communication:   Pt at beside Disposition Plan:   Home when medically stable        Procedures/Studies: Ct Head Wo Contrast  04/19/2015   CLINICAL DATA:  Found down and unresponsive.  EXAM: CT HEAD WITHOUT CONTRAST  CT CERVICAL SPINE WITHOUT CONTRAST  TECHNIQUE: Multidetector CT imaging of the head and cervical spine was performed following the standard protocol without intravenous contrast. Multiplanar CT image reconstructions of the cervical spine were also generated.  COMPARISON:  None.  FINDINGS: CT HEAD FINDINGS  Skull and Sinuses:Negative for fracture or destructive process. The mastoids, middle ears, and imaged paranasal sinuses are clear.  Orbits: Bilateral cataract resection.  Brain: No evidence of acute infarction, hemorrhage, hydrocephalus, or mass lesion/mass effect. Generalized cerebral volume loss.  CT CERVICAL SPINE FINDINGS  Negative for acute fracture or subluxation. No prevertebral edema. No gross cervical canal hematoma. Multilevel degenerative disc narrowing and facet arthropathy. No significant osseous canal or foraminal stenosis. No endplate erosion or focal bone lesion.  Interlobular septal thickening at the apices.  IMPRESSION: 1. No acute intracranial or cervical spine  findings. 2. Pulmonary edema.   Electronically Signed   By: Monte Fantasia M.D.   On: 04/19/2015 01:22   Ct Cervical  Spine Wo Contrast  04/19/2015   CLINICAL DATA:  Found down and unresponsive.  EXAM: CT HEAD WITHOUT CONTRAST  CT CERVICAL SPINE WITHOUT CONTRAST  TECHNIQUE: Multidetector CT imaging of the head and cervical spine was performed following the standard protocol without intravenous contrast. Multiplanar CT image reconstructions of the cervical spine were also generated.  COMPARISON:  None.  FINDINGS: CT HEAD FINDINGS  Skull and Sinuses:Negative for fracture or destructive process. The mastoids, middle ears, and imaged paranasal sinuses are clear.  Orbits: Bilateral cataract resection.  Brain: No evidence of acute infarction, hemorrhage, hydrocephalus, or mass lesion/mass effect. Generalized cerebral volume loss.  CT CERVICAL SPINE FINDINGS  Negative for acute fracture or subluxation. No prevertebral edema. No gross cervical canal hematoma. Multilevel degenerative disc narrowing and facet arthropathy. No significant osseous canal or foraminal stenosis. No endplate erosion or focal bone lesion.  Interlobular septal thickening at the apices.  IMPRESSION: 1. No acute intracranial or cervical spine findings. 2. Pulmonary edema.   Electronically Signed   By: Monte Fantasia M.D.   On: 04/19/2015 01:22   Mr Brain Wo Contrast  04/19/2015   CLINICAL DATA:  Altered mental status.  Unresponsive.  EXAM: MRI HEAD WITHOUT CONTRAST  TECHNIQUE: Multiplanar, multiecho pulse sequences of the brain and surrounding structures were obtained without intravenous contrast.  COMPARISON:  CT head 04/19/2015  FINDINGS: Mild to moderate atrophy. Mild ventricular enlargement consistent with the level of atrophy.  Negative for acute infarct. Minimal chronic microvascular ischemic change in the white matter.  Pituitary normal in size. Bilateral lens replacement. No orbital mass. Paranasal sinuses clear.  Negative for intracranial  hemorrhage or fluid collection.  Negative for mass or edema.  No shift of the midline structures.  IMPRESSION: Generalized atrophy.  Minimal chronic microvascular ischemia.  No acute abnormality.   Electronically Signed   By: Franchot Gallo M.D.   On: 04/19/2015 12:37   US Abdomen Complete  03/29/2015   CLINICAL DATA:  Generalized abdominal pain  EXAM: ULTRASOUND ABDOMEN COMPLETE  COMPARISON:  CT abdomen and pelvis Mar 10, 2015  FINDINGS: Gallbladder: No gallstones or wall thickening visualized. There is no pericholecystic fluid. No sonographic Murphy sign noted.  Common bile duct: Diameter: 5 mm. There is no intrahepatic, common hepatic, or common bile duct dilatation.  Liver: No focal lesion identified. Within normal limits in parenchymal echogenicity. A tiny cyst near the dome of the liver seen on CT is not appreciable on this ultrasound examination.  IVC: No abnormality visualized.  Pancreas: No mass or inflammatory focus.  Spleen: Size and appearance within normal limits.  Right Kidney: Length: 10.7 cm. Echogenicity within normal limits. No mass or hydronephrosis visualized.  Left Kidney: Length: 10.8 cm. Echogenicity within normal limits. No mass or hydronephrosis visualized.  Abdominal aorta: No aneurysm visualized.  Other findings: No demonstrable ascites.  IMPRESSION: Study within normal limits.   Electronically Signed   By: Lowella Grip III M.D.   On: 03/29/2015 08:43   Dg Chest Port 1 View  04/21/2015   CLINICAL DATA:  Pneumonia, extubated  EXAM: PORTABLE CHEST - 1 VIEW  COMPARISON:  04/20/2015  FINDINGS: Stable mild cardiomegaly and minimal interstitial edema pattern throughout the lungs. Improving bibasilar atelectasis. No effusion or pneumothorax. Trachea midline. No acute osseous finding.  IMPRESSION: Cardiomegaly with minimal interstitial edema.  Improving bibasilar atelectasis   Electronically Signed   By: Jerilynn Mages.  Shick M.D.   On: 04/21/2015 07:54   Dg Chest Port 1 View  04/20/2015  CLINICAL DATA:  Acute respiratory failure.  EXAM: PORTABLE CHEST - 1 VIEW  COMPARISON:  04/19/2015.  FINDINGS: Endotracheal tube and NG tube in good anatomic position. Cardiomegaly with interim significant clearing of pulmonary infiltrates consistent with clearing pulmonary edema. Mild to moderate moderate residual. No pleural effusion or pneumothorax. Left costophrenic angle not imaged.  IMPRESSION: 1. Lines and tubes in stable position. 2. Partial clearing of congestive heart failure and pulmonary edema.   Electronically Signed   By: Marcello Moores  Register   On: 04/20/2015 08:03   Dg Chest Port 1 View  04/19/2015   CLINICAL DATA:  Endotracheal tube placement  EXAM: PORTABLE CHEST - 1 VIEW  COMPARISON:  03/13/1999  FINDINGS: Endotracheal tube with tip between the clavicular heads and carina. The orogastric tube reaches the stomach at least.  There is interlobular septal thickening and Kerley lines consistent with pulmonary edema, also seen on the contemporaneous cervical spine CT. Opacity is asymmetric to the right. Normal heart size and mediastinal contours.  IMPRESSION: 1. Endotracheal and orogastric tubes are in good position. 2. Pulmonary edema. 3. Asymmetric opacification of the right chest could be from asymmetric edema or superimposed pneumonia/aspiration.   Electronically Signed   By: Monte Fantasia M.D.   On: 04/19/2015 01:59         Subjective:  patient is less confused but not at baseline according to the patient's wife. Denies any fevers, chills, headache, chest pain, shortness breath, nausea, vomiting, diarrhea, abdominal pain, hematuria, hematochezia, melena.  Objective: Filed Vitals:   04/21/15 2114 04/21/15 2116 04/22/15 0438 04/22/15 0533  BP: 110/66 116/65  119/75  Pulse: 100 85  100  Temp: 98.4 F (36.9 C) 98.6 F (37 C)  98.6 F (37 C)  TempSrc: Oral Oral  Oral  Resp: 18 18  18   Height:      Weight:   85.4 kg (188 lb 4.4 oz)   SpO2: 100% 100%  100%    Intake/Output  Summary (Last 24 hours) at 04/22/15 0635 Last data filed at 04/22/15 0422  Gross per 24 hour  Intake    665 ml  Output    450 ml  Net    215 ml   Weight change: 2.7 kg (5 lb 15.2 oz) Exam:   General:  Pt is alert, follows commands appropriately, not in acute distress  HEENT: No icterus, No thrush, Bevier/A,  No meningismus  Cardiovascular: RRR, S1/S2, no rubs, no gallops  Respiratory:  Bibasilar crackles. No wheeze.  Abdomen: Soft/+BS, non tender, non distended, no guarding  Extremities: No edema, No lymphangitis, No petechiae, No rashes, no synovitis  Data Reviewed: Basic Metabolic Panel:  Recent Labs Lab 04/19/15 0051 04/19/15 0059 04/19/15 0130 04/19/15 0430 04/20/15 0209 04/21/15 0211 04/22/15 0444  NA 137 137  --  137 138 139 139  K 4.6 4.5  --  5.1 3.7 3.3* 3.2*  CL 104 102  --  104 108 108 107  CO2 15*  --   --  21* 23 23 25   GLUCOSE 185* 185*  --  183* 177* 176* 157*  BUN 22* 27*  --  20 12 8 9   CREATININE 1.11 0.80 1.01 0.85 0.83 0.77 0.63  CALCIUM 8.6*  --   --  8.3* 7.7* 7.6* 7.8*  MG  --   --   --  2.2  --   --   --   PHOS  --   --   --  2.4*  --   --   --  Liver Function Tests:  Recent Labs Lab 04/19/15 0051 04/21/15 0211  AST 50* 61*  ALT 17 19  ALKPHOS 71 46  BILITOT 0.4 0.5  PROT 6.7 5.4*  ALBUMIN 3.2* 2.7*   No results for input(s): LIPASE, AMYLASE in the last 168 hours.  Recent Labs Lab 04/20/15 1018  AMMONIA 94*   CBC:  Recent Labs Lab 04/19/15 0051 04/19/15 0059 04/19/15 0430 04/20/15 0209 04/21/15 0211  WBC 11.8*  --  11.2* 7.9 5.8  NEUTROABS 10.2*  --   --   --  4.1  HGB 11.9* 13.6 11.7* 10.0* 9.1*  HCT 37.1* 40.0 36.0* 31.6* 28.3*  MCV 87.5  --  87.0 87.3 86.8  PLT 307  --  260 179 155   Cardiac Enzymes:  Recent Labs Lab 04/19/15 0130 04/20/15 0209 04/20/15 1306  CKTOTAL 490* 378  --   TROPONINI  --   --  1.01*   BNP: Invalid input(s): POCBNP CBG:  Recent Labs Lab 04/21/15 1155 04/21/15 1609  04/21/15 2001 04/21/15 2329 04/22/15 0419  GLUCAP 155* 231* 280* 273* 144*    Recent Results (from the past 240 hour(s))  MRSA PCR Screening     Status: None   Collection Time: 04/19/15  3:38 AM  Result Value Ref Range Status   MRSA by PCR NEGATIVE NEGATIVE Final    Comment:        The GeneXpert MRSA Assay (FDA approved for NASAL specimens only), is one component of a comprehensive MRSA colonization surveillance program. It is not intended to diagnose MRSA infection nor to guide or monitor treatment for MRSA infections.      Scheduled Meds: . ampicillin-sulbactam (UNASYN) IV  3 g Intravenous Q8H  . antiseptic oral rinse  7 mL Mouth Rinse QID  . aspirin  300 mg Rectal Daily  . captopril  6.25 mg Oral TID  . chlorhexidine  15 mL Mouth Rinse BID  . famotidine (PEPCID) IV  20 mg Intravenous Q12H  . heparin  5,000 Units Subcutaneous 3 times per day  . insulin aspart  0-15 Units Subcutaneous 6 times per day   Continuous Infusions: . sodium chloride 10 mL/hr at 04/20/15 1800  . dextrose 5 % and 0.45% NaCl 50 mL/hr at 04/19/15 0441     Maeva Dant, DO  [facility] Pager [phone]  If 7PM-7AM, please contact night-coverage [email or web address] Password [email or web address] 04/22/2015, 6:35 AM   LOS: 3 days

## 2015-04-23 DIAGNOSIS — I429 Cardiomyopathy, unspecified: Secondary | ICD-10-CM | POA: Insufficient documentation

## 2015-04-23 LAB — BASIC METABOLIC PANEL
Anion gap: 10 (ref 5–15)
BUN: 8 mg/dL (ref 6–20)
CO2: 23 mmol/L (ref 22–32)
Calcium: 7.9 mg/dL — ABNORMAL LOW (ref 8.9–10.3)
Chloride: 105 mmol/L (ref 101–111)
Creatinine, Ser: 0.85 mg/dL (ref 0.61–1.24)
GFR calc non Af Amer: >60 mL/min (ref >60)
Glucose, Bld: 292 mg/dL — ABNORMAL HIGH (ref 65–99)
Potassium: 3.9 mmol/L (ref 3.5–5.1)
Sodium: 138 mmol/L (ref 135–145)

## 2015-04-23 LAB — GLUCOSE, CAPILLARY
Glucose-Capillary: 354 mg/dL — ABNORMAL HIGH (ref 65–99)
Glucose-Capillary: 251 mg/dL — ABNORMAL HIGH (ref 65–99)
Glucose-Capillary: 273 mg/dL — ABNORMAL HIGH (ref 65–99)
Glucose-Capillary: 181 mg/dL — ABNORMAL HIGH (ref 65–99)

## 2015-04-23 MED ORDER — INSULIN ASPART 100 UNIT/ML ~~LOC~~ SOLN: 0.0000 [IU] | Freq: Every day | SUBCUTANEOUS | Status: DC

## 2015-04-23 MED ORDER — INSULIN ASPART 100 UNIT/ML ~~LOC~~ SOLN
0.0000 [IU] | Freq: Three times a day (TID) | SUBCUTANEOUS | Status: DC
  Administered 2015-04-23: 8 [IU] via SUBCUTANEOUS
  Administered 2015-04-24: 3 [IU] via SUBCUTANEOUS
  Administered 2015-04-24 (×2): 5 [IU] via SUBCUTANEOUS
  Administered 2015-04-25: 2 [IU] via SUBCUTANEOUS

## 2015-04-23 MED ORDER — INSULIN GLARGINE 100 UNIT/ML ~~LOC~~ SOLN
20.0000 [IU] | Freq: Every day | SUBCUTANEOUS | Status: DC
  Administered 2015-04-23: 20 [IU] via SUBCUTANEOUS
  Filled 2015-04-23 (×2): qty 0.2

## 2015-04-23 MED ORDER — ASPIRIN EC 325 MG PO TBEC
325.0000 mg | DELAYED_RELEASE_TABLET | Freq: Every day | ORAL | Status: DC
  Administered 2015-04-23 – 2015-04-25 (×3): 325 mg via ORAL
  Filled 2015-04-23 (×4): qty 1

## 2015-04-23 MED ORDER — CARVEDILOL 3.125 MG PO TABS
3.1250 mg | ORAL_TABLET | Freq: Two times a day (BID) | ORAL | Status: DC
  Administered 2015-04-23 – 2015-04-26 (×6): 3.125 mg via ORAL
  Filled 2015-04-23 (×7): qty 1

## 2015-04-23 NOTE — Progress Notes (Signed)
Subjective:  He is calmer today and has no complaints of shortness of breath or chest pain.  Still somewhat confused at times and wants to know when he can go home.  Tolerated lisinopril well yesterday.  Objective:  Vital Signs in the last 24 hours: BP 110/62 mmHg  Pulse 85  Temp(Src) 98.3 F (36.8 C) (Oral)  Resp 20  Ht 5\' 8"  (1.727 m)  Wt 86.3 kg (190 lb 4.1 oz)  BMI 28.94 kg/m2  SpO2 100%  Physical Exam: Obese male sitting in bed in no acute distress  Lungs:  Clear Cardiac:  Regular rhythm, normal S1 and S2, no S3 Abdomen:  Soft, nontender, no masses Extremities:  No edema present  Intake/Output from previous day: 07/09 0701 - 07/10 0700 In: 700 [P.O.:700] Out: 800 [Urine:800]  Weight Filed Weights   04/21/15 0400 04/22/15 0438 04/23/15 0523  Weight: 82.7 kg (182 lb 5.1 oz) 85.4 kg (188 lb 4.4 oz) 86.3 kg (190 lb 4.1 oz)    Lab Results: Basic Metabolic Panel:  Recent Labs  04/22/15 0444 04/23/15 0437  NA 139 138  K 3.2* 3.9  CL 107 105  CO2 25 23  GLUCOSE 157* 292*  BUN 9 8  CREATININE 0.63 0.85   CBC:  Recent Labs  04/21/15 0211  WBC 5.8  NEUTROABS 4.1  HGB 9.1*  HCT 28.3*  MCV 86.8  PLT 155   Cardiac Enzymes:  Cardiac Panel (last 3 results)  Recent Labs  04/20/15 1306  TROPONINI 1.01*    Telemetry: Sinus rhythm  Assessment/Plan:  1.  Cardiomyopathy type undetermined-favor Takasubo but with diabetes could be ischemic also. 2.  Delirium that is improving 3.  Mild anemia 4.  Acute systolic heart failure without dyspnea  Recommendations:  Plan Lexiscan Myoview test tomorrow to assess for ischemia.  Initiate carvedilol today.      Kerry Hough  MD [facility]  04/23/2015, 11:49 AM

## 2015-04-23 NOTE — Progress Notes (Signed)
PROGRESS NOTE  Dale Morrow DJM:[id number] DOB: [birth year] DOA: 04/19/2015 PCP: Jonathon Bellows, MD  Brief history 74 year old male with a history of diabetes mellitus, prostate cancer (s/p XRT), depression presented to the emergency department after being found unresponsive by his wife at 11:45 PM on 04/18/2015. The patient was last seen normal around 3:30 PM on 04/18/2015. He was noted to be rigid by EMS. Initial CBG was 3:30 and responded well to D50. He was agitated in the emergency department, and he was noted to be posturing.. CT of the brain was unremarkable. The patient was intubated in the emergency department. He was noted to be hypertensive with systolic blood pressure in the 200s initially. Neurology was consulted due to suspected seizure activity. Continued workup revealed that the patient's posturing was not likely due to seizure activity. Cardiology was consulted for the patient's cardiomyopathy one echocardiogram revealed that his EF was 25-30 percent. Assessment/Plan: Acute respiratory failure -Thought to be secondary to seizure activity and pulmonary edema - extubated on 04/20/2015 -presently stable without increased WOB Elevated troponin -Likely due to demand ischemia - cardiology was consulted after Echocardiogram revealed ejection fraction of 25% - EKG without any concerning ischemic changes Nonischemic cardiomyopathy - appreciate cardiology consultation - Thought to be Takotsubo cardiomyopathy  -Echo 04/19/2015 EF 25-30%, akinesis of mid-apical anteroseptal, anterolateral, inferolateral, inferoseptal and apical myodium, with basal sparing. characteristics of takotsubo cardiomyoapthy -Continue lisinopril and carvedilol - plan to repeat echocardiogram in 2 weeks - Lexiscan planned 04/24/2015 Hematuria -Suspect Foley, as the patient was agitated early in the morning 04/23/2015 -Repeat CBC Seizure Like Activity/ Acute encephalopathy -multifactorial including  hypoglycemia, cardiomyopathy and aspiration pneumonia -Neurology was consulted -Initial EEG is consistent with a generalized non-specific cerebral dysfunction(encephalopathy) without seizure predisposition - patient continued to have increase tone and rigidity - Repeat EEG 04/20/2015 did not show any epileptiform discharges during an episode of increased tone and posturing - Suspected encephalopathy was likely metabolic in nature secondary to prolonged hypoglycemia - Keppra was subsequently discontinued - MRI brain negative for acute findings -check B12--850 -TSH--0.551 Aspiration pneumonia - continue Unasyn D#5 -stable on RA Hyerammonemia -if remains elevated, then further investigations warranted -[id number]-->92 Metabolic acidosis -Question whether this was related to the patient's seizure activity -Patient had elevated CPK -resolved Diabetes mellitus type 1 -Patient had ketonuria initially, but did not have hyperglycemia -As the patient remains intermittently confused, will not restart insulin pump at this time -Increase Lantus to 20 units daily  -change to moderate SSI Hypokalemia - replete - check magnesium--1.9   Family Communication: left voicemail for wife Disposition Plan: SNF 1-2 days     Procedures/Studies: Ct Head Wo Contrast  04/19/2015   CLINICAL DATA:  Found down and unresponsive.  EXAM: CT HEAD WITHOUT CONTRAST  CT CERVICAL SPINE WITHOUT CONTRAST  TECHNIQUE: Multidetector CT imaging of the head and cervical spine was performed following the standard protocol without intravenous contrast. Multiplanar CT image reconstructions of the cervical spine were also generated.  COMPARISON:  None.  FINDINGS: CT HEAD FINDINGS  Skull and Sinuses:Negative for fracture or destructive process. The mastoids, middle ears, and imaged paranasal sinuses are clear.  Orbits: Bilateral cataract resection.  Brain: No evidence of acute infarction, hemorrhage, hydrocephalus, or mass  lesion/mass effect. Generalized cerebral volume loss.  CT CERVICAL SPINE FINDINGS  Negative for acute fracture or subluxation. No prevertebral edema. No gross cervical canal hematoma. Multilevel degenerative disc narrowing and facet arthropathy. No significant osseous canal or  foraminal stenosis. No endplate erosion or focal bone lesion.  Interlobular septal thickening at the apices.  IMPRESSION: 1. No acute intracranial or cervical spine findings. 2. Pulmonary edema.   Electronically Signed   By: Monte Fantasia M.D.   On: 04/19/2015 01:22   Ct Cervical Spine Wo Contrast  04/19/2015   CLINICAL DATA:  Found down and unresponsive.  EXAM: CT HEAD WITHOUT CONTRAST  CT CERVICAL SPINE WITHOUT CONTRAST  TECHNIQUE: Multidetector CT imaging of the head and cervical spine was performed following the standard protocol without intravenous contrast. Multiplanar CT image reconstructions of the cervical spine were also generated.  COMPARISON:  None.  FINDINGS: CT HEAD FINDINGS  Skull and Sinuses:Negative for fracture or destructive process. The mastoids, middle ears, and imaged paranasal sinuses are clear.  Orbits: Bilateral cataract resection.  Brain: No evidence of acute infarction, hemorrhage, hydrocephalus, or mass lesion/mass effect. Generalized cerebral volume loss.  CT CERVICAL SPINE FINDINGS  Negative for acute fracture or subluxation. No prevertebral edema. No gross cervical canal hematoma. Multilevel degenerative disc narrowing and facet arthropathy. No significant osseous canal or foraminal stenosis. No endplate erosion or focal bone lesion.  Interlobular septal thickening at the apices.  IMPRESSION: 1. No acute intracranial or cervical spine findings. 2. Pulmonary edema.   Electronically Signed   By: Monte Fantasia M.D.   On: 04/19/2015 01:22   Mr Brain Wo Contrast  04/19/2015   CLINICAL DATA:  Altered mental status.  Unresponsive.  EXAM: MRI HEAD WITHOUT CONTRAST  TECHNIQUE: Multiplanar, multiecho pulse  sequences of the brain and surrounding structures were obtained without intravenous contrast.  COMPARISON:  CT head 04/19/2015  FINDINGS: Mild to moderate atrophy. Mild ventricular enlargement consistent with the level of atrophy.  Negative for acute infarct. Minimal chronic microvascular ischemic change in the white matter.  Pituitary normal in size. Bilateral lens replacement. No orbital mass. Paranasal sinuses clear.  Negative for intracranial hemorrhage or fluid collection.  Negative for mass or edema.  No shift of the midline structures.  IMPRESSION: Generalized atrophy.  Minimal chronic microvascular ischemia.  No acute abnormality.   Electronically Signed   By: Franchot Gallo M.D.   On: 04/19/2015 12:37   US Abdomen Complete  03/29/2015   CLINICAL DATA:  Generalized abdominal pain  EXAM: ULTRASOUND ABDOMEN COMPLETE  COMPARISON:  CT abdomen and pelvis Mar 10, 2015  FINDINGS: Gallbladder: No gallstones or wall thickening visualized. There is no pericholecystic fluid. No sonographic Murphy sign noted.  Common bile duct: Diameter: 5 mm. There is no intrahepatic, common hepatic, or common bile duct dilatation.  Liver: No focal lesion identified. Within normal limits in parenchymal echogenicity. A tiny cyst near the dome of the liver seen on CT is not appreciable on this ultrasound examination.  IVC: No abnormality visualized.  Pancreas: No mass or inflammatory focus.  Spleen: Size and appearance within normal limits.  Right Kidney: Length: 10.7 cm. Echogenicity within normal limits. No mass or hydronephrosis visualized.  Left Kidney: Length: 10.8 cm. Echogenicity within normal limits. No mass or hydronephrosis visualized.  Abdominal aorta: No aneurysm visualized.  Other findings: No demonstrable ascites.  IMPRESSION: Study within normal limits.   Electronically Signed   By: Lowella Grip III M.D.   On: 03/29/2015 08:43   Dg Chest Port 1 View  04/21/2015   CLINICAL DATA:  Pneumonia, extubated  EXAM:  PORTABLE CHEST - 1 VIEW  COMPARISON:  04/20/2015  FINDINGS: Stable mild cardiomegaly and minimal interstitial edema pattern throughout the lungs. Improving bibasilar  atelectasis. No effusion or pneumothorax. Trachea midline. No acute osseous finding.  IMPRESSION: Cardiomegaly with minimal interstitial edema.  Improving bibasilar atelectasis   Electronically Signed   By: Jerilynn Mages.  Shick M.D.   On: 04/21/2015 07:54   Dg Chest Port 1 View  04/20/2015   CLINICAL DATA:  Acute respiratory failure.  EXAM: PORTABLE CHEST - 1 VIEW  COMPARISON:  04/19/2015.  FINDINGS: Endotracheal tube and NG tube in good anatomic position. Cardiomegaly with interim significant clearing of pulmonary infiltrates consistent with clearing pulmonary edema. Mild to moderate moderate residual. No pleural effusion or pneumothorax. Left costophrenic angle not imaged.  IMPRESSION: 1. Lines and tubes in stable position. 2. Partial clearing of congestive heart failure and pulmonary edema.   Electronically Signed   By: Marcello Moores  Register   On: 04/20/2015 08:03   Dg Chest Port 1 View  04/19/2015   CLINICAL DATA:  Endotracheal tube placement  EXAM: PORTABLE CHEST - 1 VIEW  COMPARISON:  03/13/1999  FINDINGS: Endotracheal tube with tip between the clavicular heads and carina. The orogastric tube reaches the stomach at least.  There is interlobular septal thickening and Kerley lines consistent with pulmonary edema, also seen on the contemporaneous cervical spine CT. Opacity is asymmetric to the right. Normal heart size and mediastinal contours.  IMPRESSION: 1. Endotracheal and orogastric tubes are in good position. 2. Pulmonary edema. 3. Asymmetric opacification of the right chest could be from asymmetric edema or superimposed pneumonia/aspiration.   Electronically Signed   By: Monte Fantasia M.D.   On: 04/19/2015 01:59         Subjective: Patient intermittently confused. He was agitated early in the morning. Presently denies any fevers, chills, chest  pain, shortness breath, nausea, vomiting, diarrhea, abdominal pain. He has hematuria in the Foley catheter.  Objective: Filed Vitals:   04/22/15 1422 04/22/15 2120 04/23/15 0523 04/23/15 0840  BP: 126/67 104/60 105/56 110/62  Pulse: 77 90 85   Temp: 98.6 F (37 C) 98.5 F (36.9 C) 98.3 F (36.8 C)   TempSrc:  Oral Oral   Resp: 18 20 20    Height:      Weight:   86.3 kg (190 lb 4.1 oz)   SpO2: 99% 100% 100%     Intake/Output Summary (Last 24 hours) at 04/23/15 1337 Last data filed at 04/23/15 0808  Gross per 24 hour  Intake    400 ml  Output   1000 ml  Net   -600 ml   Weight change: 0.9 kg (1 lb 15.7 oz) Exam:   General:  Pt is alert, follows commands appropriately, not in acute distress  HEENT: No icterus, No thrush, No neck mass, [place]/AT; no meningismus  Cardiovascular: RRR, S1/S2, no rubs, no gallops  Respiratory: CTA bilaterally, no wheezing, no crackles, no rhonchi  Abdomen: Soft/+BS, non tender, non distended, no guarding; no paraspinal megaly  Extremities: No edema, No lymphangitis, No petechiae, No rashes, no synovitis; no cyanosis or clubbing  Data Reviewed: Basic Metabolic Panel:  Recent Labs Lab 04/19/15 0430 04/20/15 0209 04/21/15 0211 04/22/15 0444 04/22/15 1150 04/23/15 0437  NA 137 138 139 139  --  138  K 5.1 3.7 3.3* 3.2*  --  3.9  CL 104 108 108 107  --  105  CO2 21* 23 23 25   --  23  GLUCOSE 183* 177* 176* 157*  --  292*  BUN 20 12 8 9   --  8  CREATININE 0.85 0.83 0.77 0.63  --  0.85  CALCIUM  8.3* 7.7* 7.6* 7.8*  --  7.9*  MG 2.2  --   --   --  1.9  --   PHOS 2.4*  --   --   --   --   --    Liver Function Tests:  Recent Labs Lab 04/19/15 0051 04/21/15 0211  AST 50* 61*  ALT 17 19  ALKPHOS 71 46  BILITOT 0.4 0.5  PROT 6.7 5.4*  ALBUMIN 3.2* 2.7*   No results for input(s): LIPASE, AMYLASE in the last 168 hours.  Recent Labs Lab 04/20/15 1018 04/22/15 1150  AMMONIA 94* 33   CBC:  Recent Labs Lab 04/19/15 0051  04/19/15 0059 04/19/15 0430 04/20/15 0209 04/21/15 0211  WBC 11.8*  --  11.2* 7.9 5.8  NEUTROABS 10.2*  --   --   --  4.1  HGB 11.9* 13.6 11.7* 10.0* 9.1*  HCT 37.1* 40.0 36.0* 31.6* 28.3*  MCV 87.5  --  87.0 87.3 86.8  PLT 307  --  260 179 155   Cardiac Enzymes:  Recent Labs Lab 04/19/15 0130 04/20/15 0209 04/20/15 1306  CKTOTAL 490* 378  --   TROPONINI  --   --  1.01*   BNP: Invalid input(s): POCBNP CBG:  Recent Labs Lab 04/22/15 1140 04/22/15 1658 04/22/15 2117 04/23/15 0733 04/23/15 1217  GLUCAP 243* 309* 277* 354* 251*    Recent Results (from the past 240 hour(s))  MRSA PCR Screening     Status: None   Collection Time: 04/19/15  3:38 AM  Result Value Ref Range Status   MRSA by PCR NEGATIVE NEGATIVE Final    Comment:        The GeneXpert MRSA Assay (FDA approved for NASAL specimens only), is one component of a comprehensive MRSA colonization surveillance program. It is not intended to diagnose MRSA infection nor to guide or monitor treatment for MRSA infections.      Scheduled Meds: . ampicillin-sulbactam (UNASYN) IV  3 g Intravenous Q8H  . antiseptic oral rinse  7 mL Mouth Rinse QID  . aspirin EC  325 mg Oral Daily  . carvedilol  3.125 mg Oral BID WC  . chlorhexidine  15 mL Mouth Rinse BID  . heparin  5,000 Units Subcutaneous 3 times per day  . insulin aspart  0-5 Units Subcutaneous QHS  . insulin aspart  0-9 Units Subcutaneous TID WC  . insulin glargine  10 Units Subcutaneous QHS  . lisinopril  5 mg Oral BID  . pantoprazole  40 mg Oral Q1200   Continuous Infusions: . sodium chloride 10 mL/hr at 04/20/15 1800  . dextrose 5 % and 0.45% NaCl 50 mL/hr at 04/19/15 0441     Blonnie Maske, DO  [facility] Pager [phone]  If 7PM-7AM, please contact night-coverage [email or web address] Password [email or web address] 04/23/2015, 1:37 PM   LOS: 4 days

## 2015-04-24 ENCOUNTER — Inpatient Hospital Stay (HOSPITAL_COMMUNITY): Payer: 59

## 2015-04-24 ENCOUNTER — Encounter (HOSPITAL_COMMUNITY): Payer: 59

## 2015-04-24 ENCOUNTER — Ambulatory Visit: Payer: 59

## 2015-04-24 DIAGNOSIS — I429 Cardiomyopathy, unspecified: Secondary | ICD-10-CM

## 2015-04-24 LAB — CBC
HEMATOCRIT: 31.2 % — AB (ref 39.0–52.0)
Hemoglobin: 10.1 g/dL — ABNORMAL LOW (ref 13.0–17.0)
MCH: 28.5 pg (ref 26.0–34.0)
MCHC: 32.4 g/dL (ref 30.0–36.0)
MCV: 87.9 fL (ref 78.0–100.0)
Platelets: 159 10*3/uL (ref 150–400)
RBC: 3.55 MIL/uL — AB (ref 4.22–5.81)
RDW: 16.2 % — ABNORMAL HIGH (ref 11.5–15.5)
WBC: 4.6 10*3/uL (ref 4.0–10.5)

## 2015-04-24 LAB — NM MYOCAR MULTI W/SPECT W/WALL MOTION / EF
CHL CUP NUCLEAR SRS: 17
CHL CUP NUCLEAR SSS: 18
LHR: 0.45
LV dias vol: 126 mL
LVSYSVOL: 90 mL
NUC STRESS TID: 1.14
SDS: 4

## 2015-04-24 LAB — BASIC METABOLIC PANEL
ANION GAP: 10 (ref 5–15)
BUN: 11 mg/dL (ref 6–20)
CALCIUM: 7.7 mg/dL — AB (ref 8.9–10.3)
CHLORIDE: 105 mmol/L (ref 101–111)
CO2: 22 mmol/L (ref 22–32)
CREATININE: 0.64 mg/dL (ref 0.61–1.24)
GLUCOSE: 195 mg/dL — AB (ref 65–99)
POTASSIUM: 3.5 mmol/L (ref 3.5–5.1)
Sodium: 137 mmol/L (ref 135–145)

## 2015-04-24 LAB — HEMOGLOBIN A1C
Hgb A1c MFr Bld: 7.3 % — ABNORMAL HIGH (ref 4.8–5.6)
MEAN PLASMA GLUCOSE: 163 mg/dL

## 2015-04-24 LAB — GLUCOSE, CAPILLARY
Glucose-Capillary: 228 mg/dL — ABNORMAL HIGH (ref 65–99)
Glucose-Capillary: 206 mg/dL — ABNORMAL HIGH (ref 65–99)
Glucose-Capillary: 158 mg/dL — ABNORMAL HIGH (ref 65–99)
Glucose-Capillary: 115 mg/dL — ABNORMAL HIGH (ref 65–99)

## 2015-04-24 MED ORDER — INSULIN GLARGINE 100 UNIT/ML ~~LOC~~ SOLN
25.0000 [IU] | Freq: Every day | SUBCUTANEOUS | Status: DC
  Administered 2015-04-24: 25 [IU] via SUBCUTANEOUS
  Filled 2015-04-24 (×2): qty 0.25

## 2015-04-24 MED ORDER — TECHNETIUM TC 99M SESTAMIBI - CARDIOLITE
30.0000 | Freq: Once | INTRAVENOUS | Status: AC | PRN
  Administered 2015-04-24: 10:00:00 30 via INTRAVENOUS

## 2015-04-24 MED ORDER — REGADENOSON 0.4 MG/5ML IV SOLN
INTRAVENOUS | Status: AC
  Administered 2015-04-24: 0.4 mg via INTRAVENOUS
  Filled 2015-04-24: qty 5

## 2015-04-24 MED ORDER — INSULIN ASPART 100 UNIT/ML ~~LOC~~ SOLN
3.0000 [IU] | Freq: Three times a day (TID) | SUBCUTANEOUS | Status: DC
  Administered 2015-04-24 – 2015-04-25 (×4): 3 [IU] via SUBCUTANEOUS

## 2015-04-24 MED ORDER — TECHNETIUM TC 99M SESTAMIBI GENERIC - CARDIOLITE
10.0000 | Freq: Once | INTRAVENOUS | Status: AC | PRN
  Administered 2015-04-24: 10 via INTRAVENOUS

## 2015-04-24 MED ORDER — REGADENOSON 0.4 MG/5ML IV SOLN
0.4000 mg | Freq: Once | INTRAVENOUS | Status: AC
  Administered 2015-04-24: 0.4 mg via INTRAVENOUS
  Filled 2015-04-24: qty 5

## 2015-04-24 MED ORDER — POTASSIUM CHLORIDE CRYS ER 20 MEQ PO TBCR
20.0000 meq | EXTENDED_RELEASE_TABLET | Freq: Once | ORAL | Status: AC
  Administered 2015-04-24: 20 meq via ORAL
  Filled 2015-04-24: qty 1

## 2015-04-24 NOTE — Evaluation (Signed)
Occupational Therapy Evaluation Patient Details Name: Dale Morrow MRN: [id number] DOB: Dec 22, 1940 Today's Date: 04/24/2015    History of Present Illness 74 yo male with onset of seizures pre-admit and CHF with cardiomyopathy,    Clinical Impression   Pt performed selfcare tasks sit to stand and at EOB with overall min assist level.  Pt tearful on a couple of occasions stating he really doesn't want to be here.  Currently with deficits in balance and cognition which require 24 hour assistance.  Feel he will benefit from acute care OT to progress to modified independent level, but will likely need SNF for post acute rehab to reach this level.    Follow Up Recommendations  SNF;Supervision/Assistance - 24 hour    Equipment Recommendations  None recommended by OT (TBD next venue of care)       Precautions / Restrictions Precautions Precautions: Fall Restrictions Weight Bearing Restrictions: No      Mobility Bed Mobility Overal bed mobility: Modified Independent Bed Mobility: Supine to Sit              Transfers Overall transfer level: Needs assistance Equipment used: Rolling walker (2 wheeled);1 person hand held assist Transfers: Sit to/from [facility] Sit to Stand: Min assist Stand pivot transfers: Min assist       General transfer comment: Pt transferred to bedside chair in flexed position in order to reach the arm rests.  Did not stand completely up when therapist cued him to do so.     Balance     Sitting balance-Leahy Scale: Good       Standing balance-Leahy Scale: Fair                              ADL Overall ADL's : Needs assistance/impaired Eating/Feeding: Independent;Sitting   Grooming: Wash/dry hands;Wash/dry face;Set up;Sitting   Upper Body Bathing: Set up;Sitting   Lower Body Bathing: Minimal assistance;Sit to/from stand   Upper Body Dressing : Set up;Sitting   Lower Body Dressing: Minimal assistance;Sit  to/from stand   Toilet Transfer: Minimal Scientist, forensic Details (indicate cue type and reason): simulated to bedside chair Toileting- Clothing Manipulation and Hygiene: Minimal assistance;Sit to/from stand         General ADL Comments: Pt pleasant but anxious to get to SNF rehab, which he has had experience with before.  Min assist for functional transfers at this time.      Vision Additional Comments: Pt able to identify correct number of fingers held up in all quadrants without difficulty.    Perception Perception Perception Tested?: No   Praxis Praxis Praxis tested?: Within functional limits    Pertinent Vitals/Pain Pain Assessment: No/denies pain     Hand Dominance Right   Extremity/Trunk Assessment Upper Extremity Assessment Upper Extremity Assessment: Generalized weakness (Noted bilateral tremors with finger to nose and with self feeding.)   Lower Extremity Assessment Lower Extremity Assessment: Defer to PT evaluation   Cervical / Trunk Assessment Cervical / Trunk Assessment: Normal   Communication Communication Communication: No difficulties;Other (comment) (confusion)   Cognition Arousal/Alertness: Awake/alert Behavior During Therapy: Anxious Overall Cognitive Status: Impaired/Different from baseline Area of Impairment: Memory;Safety/judgement;Awareness         Safety/Judgement: Decreased awareness of safety Awareness: Intellectual   General Comments: Pt not able to recall reason for hospitalization.              Home Living Family/patient expects to be discharged to:: Private  residence Living Arrangements: Spouse/significant other Available Help at Discharge: Family Type of Home: House Home Access: Stairs to enter Technical brewer of Steps: [place]: Two level;Bed/bath upstairs;1/2 bath on main level Alternate Level Stairs-Number of Steps: flight   Bathroom Shower/Tub: Tub/shower unit Shower/tub  characteristics: Architectural technologist: Standard         Additional Comments: Pt is poor historian and cannot completely get details from him      Prior Functioning/Environment Level of Independence: Independent with assistive device(s)        Comments: Per pt    OT Diagnosis: Generalized weakness;Cognitive deficits   OT Problem List: Decreased strength;Impaired balance (sitting and/or standing);Decreased cognition;Decreased knowledge of use of DME or AE   OT Treatment/Interventions: Self-care/ADL training;Balance training;Therapeutic activities;DME and/or AE instruction;Patient/family education    OT Goals(Current goals can be found in the care plan section) Acute Rehab OT Goals Patient Stated Goal: Pt wants to go to [facility] for rehab. OT Goal Formulation: With patient Time For Goal Achievement: 05/08/15 Potential to Achieve Goals: Good  OT Frequency: Min 2X/week              End of Session Nurse Communication: Mobility status  Activity Tolerance: Patient tolerated treatment well Patient left: in chair;with call bell/phone within reach   Time: 1314-1344 OT Time Calculation (min): 30 min Charges:  OT General Charges $OT Visit: 1 Procedure OT Evaluation $Initial OT Evaluation Tier I: 1 Procedure OT Treatments $Self Care/Home Management : 8-22 mins  Ules Marsala OTR/L 04/24/2015, 1:58 PM

## 2015-04-24 NOTE — Progress Notes (Signed)
Utilization Review completed. Kathaleya Mcduffee RN BSN CM 

## 2015-04-24 NOTE — Progress Notes (Signed)
Patient Name: Dale Morrow Date of Encounter: 04/24/2015  Active Problems:   Status epilepticus   Altered consciousness   Acute respiratory failure   Seizure-like activity   Acute encephalopathy   Altered mental status   Elevated troponin   Aspiration pneumonia   Cardiomyopathy   Primary Cardiologist: Dr Marlou Porch  Patient Profile: 74 yo male w/ hx DM, prostate CA, admitted 07/06 w/ AMS, SZ. Found to have EF 25% w/ WMA, ?Takotsubo, Trop 1.01, CK 490. MV 07/11  SUBJECTIVE: No chest pain, no SOB  OBJECTIVE Filed Vitals:   04/23/15 1700 04/23/15 2131 04/24/15 0508 04/24/15 0920  BP: 130/66 107/71 103/59 130/75  Pulse: 90 83 78 93  Temp:  98.9 F (37.2 C) 98.2 F (36.8 C)   TempSrc:  Oral Oral   Resp:  18 18   Height:      Weight:   192 lb 0.3 oz (87.1 kg)   SpO2:  100% 100%     Intake/Output Summary (Last 24 hours) at 04/24/15 0947 Last data filed at 04/24/15 0998  Gross per 24 hour  Intake    400 ml  Output   1050 ml  Net   -650 ml   Filed Weights   04/22/15 0438 04/23/15 0523 04/24/15 0508  Weight: 188 lb 4.4 oz (85.4 kg) 190 lb 4.1 oz (86.3 kg) 192 lb 0.3 oz (87.1 kg)    PHYSICAL EXAM General: Well developed, well nourished, male in no acute distress. Head: Normocephalic, atraumatic.  Neck: Supple without bruits, JVD not elevated. Lungs:  Resp regular and unlabored, CTA. Heart: RRR, S1, S2, no S3, S4, or murmur; no rub. Abdomen: Soft, non-tender, non-distended, BS + x 4.  Extremities: No clubbing, cyanosis, edema.  Neuro: Alert and oriented X 2. Moves all extremities spontaneously. Forgetful but appears to comprehend information Psych: Normal affect. Easily confused  LABS: CBC: Recent Labs  04/24/15 0455  WBC 4.6  HGB 10.1*  HCT 31.2*  MCV 87.9  PLT 338   Basic Metabolic Panel: Recent Labs  04/22/15 1150 04/23/15 0437 04/24/15 0455  NA  --  138 137  K  --  3.9 3.5  CL  --  105 105  CO2  --  23 22  GLUCOSE  --  292* 195*  BUN  --   8 11  CREATININE  --  0.85 0.64  CALCIUM  --  7.9* 7.7*  MG 1.9  --   --    Anemia Panel: Recent Labs  04/22/15 1150  VITAMINB12 850    TELE:  SR, ST,  Seen in nuc med       Current Medications:  . ampicillin-sulbactam (UNASYN) IV  3 g Intravenous Q8H  . antiseptic oral rinse  7 mL Mouth Rinse QID  . aspirin EC  325 mg Oral Daily  . carvedilol  3.125 mg Oral BID WC  . chlorhexidine  15 mL Mouth Rinse BID  . heparin  5,000 Units Subcutaneous 3 times per day  . insulin aspart  0-15 Units Subcutaneous TID WC  . insulin aspart  0-5 Units Subcutaneous QHS  . insulin glargine  20 Units Subcutaneous QHS  . lisinopril  5 mg Oral BID  . pantoprazole  40 mg Oral Q1200   . sodium chloride 10 mL/hr at 04/20/15 1800    ASSESSMENT AND PLAN:   Elevated troponin, Cardiomyopathy - elevated trop, but occurred in the setting of acidosis, AMS, sz, hypoglycemic event - Echo 04/19/2015 EF 25-30%, akinesis  of mid-apical anteroseptal, anterolateral, inferolateral, inferoseptal and apical myodium, with basal sparing. characteristics of Takotsubo cardiomyoapthy - [place], if it is truly Takotsubo's should see some improvement in EF after 7-14 days, myoview 07/11 - on carvedilol and lisinopril  - no SOB: if has sob, add lasix  Otherwise, per IM Active Problems:   Status epilepticus   Altered consciousness   Acute respiratory failure   Seizure-like activity   Acute encephalopathy   Altered mental status   Aspiration pneumonia  Signed, Lenoard Aden 9:47 AM 04/24/2015 Patient seen and examined  S/P seizure  Had [facility] today  Will review. No other recommendations Dorris Carnes

## 2015-04-24 NOTE — Progress Notes (Signed)
Lexiscan MV performed, 1 day study, [facility] to read.

## 2015-04-24 NOTE — Progress Notes (Signed)
PT Cancellation Note  Patient Details Name: ELIJHA DEDMAN MRN: [id number] DOB: 1940-11-28   Cancelled Treatment:    Reason Eval/Treat Not Completed: Patient at procedure or test/unavailable. IV team working with pt and report they need 5-10 minutes. Will reattempt   Delina Kruczek 04/24/2015, 2:07 PM  Pager [phone]

## 2015-04-24 NOTE — Clinical Social Work Note (Signed)
Wife requesting [employer]. Robinette is requesting updated PT/OT notes ASAP so that Josem Kaufmann can be started. Charge RN notified of need for updated notes.    Liz Beach MSW, [place], [place], [phone]

## 2015-04-24 NOTE — Progress Notes (Signed)
PROGRESS NOTE  Dale Morrow PQZ:[id number] DOB: 12-19-1940 DOA: 04/19/2015 PCP: Jonathon Bellows, MD  Brief history 74 year old male with a history of diabetes mellitus, prostate cancer (s/p XRT), depression presented to the emergency department after being found unresponsive by his wife at 11:45 PM on 04/18/2015. The patient was last seen normal around 3:30 PM on 04/18/2015. He was noted to be rigid by EMS. Initial CBG was 3:30 and responded well to D50. He was agitated in the emergency department, and he was noted to be posturing.. CT of the brain was unremarkable. The patient was intubated in the emergency department. He was noted to be hypertensive with systolic blood pressure in the 200s initially. Neurology was consulted due to suspected seizure activity. Continued workup revealed that the patient's posturing was not likely due to seizure activity. Cardiology was consulted for the patient's cardiomyopathy one echocardiogram revealed that his EF was 25-30 percent.  Assessment/Plan: Acute respiratory failure -Thought to be secondary to seizure activity and pulmonary edema - extubated on 04/20/2015 -presently stable without increased WOB -Presently stable on room air Elevated troponin -Likely due to demand ischemia - cardiology was consulted after Echocardiogram revealed ejection fraction of 25% - EKG without any concerning ischemic changes Nonischemic cardiomyopathy - appreciate cardiology consultation - Thought to be Takotsubo cardiomyopathy  -Echo 04/19/2015 EF 25-30%, akinesis of mid-apical anteroseptal, anterolateral, inferolateral, inferoseptal and apical myodium, with basal sparing. characteristics of takotsubo cardiomyoapthy -Continue lisinopril and carvedilol - plan to repeat echocardiogram in 2 weeks - Lexiscan 04/24/2015--pending Hematuria -Suspect Foley, as the patient was agitated early in the morning 04/23/2015 -Repeat CBC--hemoglobin stable -Discontinue Foley  catheter -urine clearing today Seizure Like Activity/ Acute encephalopathy -multifactorial including hypoglycemia, cardiomyopathy and aspiration pneumonia -Neurology was consulted -Initial EEG is consistent with a generalized non-specific cerebral dysfunction(encephalopathy) without seizure predisposition - patient continued to have increase tone and rigidity - Repeat EEG 04/20/2015 did not show any epileptiform discharges during an episode of increased tone and posturing - Suspected encephalopathy was likely metabolic in nature secondary to prolonged hypoglycemia - Keppra was subsequently discontinued - MRI brain negative for acute findings -check B12--850 -TSH--0.551 Aspiration pneumonia - continue Unasyn D#6 -stable on RA Hyerammonemia -if remains elevated, then further investigations warranted -[id number]-->56 Metabolic acidosis -Question whether this was related to the patient's seizure activity -Patient had elevated CPK -resolved Diabetes mellitus type 1 -Patient had ketonuria initially, but did not have hyperglycemia -As the patient remains intermittently confused, will not restart insulin pump at this time -Increase Lantus to 25 units daily  -change to moderate SSI -add novolog 3 units with meals Hypokalemia - replete - check magnesium--1.9   Family Communication: wife updated on phone Disposition Plan: SNF 04/25/15       Procedures/Studies: Ct Head Wo Contrast  04/19/2015   CLINICAL DATA:  Found down and unresponsive.  EXAM: CT HEAD WITHOUT CONTRAST  CT CERVICAL SPINE WITHOUT CONTRAST  TECHNIQUE: Multidetector CT imaging of the head and cervical spine was performed following the standard protocol without intravenous contrast. Multiplanar CT image reconstructions of the cervical spine were also generated.  COMPARISON:  None.  FINDINGS: CT HEAD FINDINGS  Skull and Sinuses:Negative for fracture or destructive process. The mastoids, middle ears, and imaged paranasal  sinuses are clear.  Orbits: Bilateral cataract resection.  Brain: No evidence of acute infarction, hemorrhage, hydrocephalus, or mass lesion/mass effect. Generalized cerebral volume loss.  CT CERVICAL SPINE FINDINGS  Negative for acute fracture or subluxation. No  prevertebral edema. No gross cervical canal hematoma. Multilevel degenerative disc narrowing and facet arthropathy. No significant osseous canal or foraminal stenosis. No endplate erosion or focal bone lesion.  Interlobular septal thickening at the apices.  IMPRESSION: 1. No acute intracranial or cervical spine findings. 2. Pulmonary edema.   Electronically Signed   By: Monte Fantasia M.D.   On: 04/19/2015 01:22   Ct Cervical Spine Wo Contrast  04/19/2015   CLINICAL DATA:  Found down and unresponsive.  EXAM: CT HEAD WITHOUT CONTRAST  CT CERVICAL SPINE WITHOUT CONTRAST  TECHNIQUE: Multidetector CT imaging of the head and cervical spine was performed following the standard protocol without intravenous contrast. Multiplanar CT image reconstructions of the cervical spine were also generated.  COMPARISON:  None.  FINDINGS: CT HEAD FINDINGS  Skull and Sinuses:Negative for fracture or destructive process. The mastoids, middle ears, and imaged paranasal sinuses are clear.  Orbits: Bilateral cataract resection.  Brain: No evidence of acute infarction, hemorrhage, hydrocephalus, or mass lesion/mass effect. Generalized cerebral volume loss.  CT CERVICAL SPINE FINDINGS  Negative for acute fracture or subluxation. No prevertebral edema. No gross cervical canal hematoma. Multilevel degenerative disc narrowing and facet arthropathy. No significant osseous canal or foraminal stenosis. No endplate erosion or focal bone lesion.  Interlobular septal thickening at the apices.  IMPRESSION: 1. No acute intracranial or cervical spine findings. 2. Pulmonary edema.   Electronically Signed   By: Monte Fantasia M.D.   On: 04/19/2015 01:22   Mr Brain Wo Contrast  04/19/2015    CLINICAL DATA:  Altered mental status.  Unresponsive.  EXAM: MRI HEAD WITHOUT CONTRAST  TECHNIQUE: Multiplanar, multiecho pulse sequences of the brain and surrounding structures were obtained without intravenous contrast.  COMPARISON:  CT head 04/19/2015  FINDINGS: Mild to moderate atrophy. Mild ventricular enlargement consistent with the level of atrophy.  Negative for acute infarct. Minimal chronic microvascular ischemic change in the white matter.  Pituitary normal in size. Bilateral lens replacement. No orbital mass. Paranasal sinuses clear.  Negative for intracranial hemorrhage or fluid collection.  Negative for mass or edema.  No shift of the midline structures.  IMPRESSION: Generalized atrophy.  Minimal chronic microvascular ischemia.  No acute abnormality.   Electronically Signed   By: Franchot Gallo M.D.   On: 04/19/2015 12:37   US Abdomen Complete  03/29/2015   CLINICAL DATA:  Generalized abdominal pain  EXAM: ULTRASOUND ABDOMEN COMPLETE  COMPARISON:  CT abdomen and pelvis Mar 10, 2015  FINDINGS: Gallbladder: No gallstones or wall thickening visualized. There is no pericholecystic fluid. No sonographic Murphy sign noted.  Common bile duct: Diameter: 5 mm. There is no intrahepatic, common hepatic, or common bile duct dilatation.  Liver: No focal lesion identified. Within normal limits in parenchymal echogenicity. A tiny cyst near the dome of the liver seen on CT is not appreciable on this ultrasound examination.  IVC: No abnormality visualized.  Pancreas: No mass or inflammatory focus.  Spleen: Size and appearance within normal limits.  Right Kidney: Length: 10.7 cm. Echogenicity within normal limits. No mass or hydronephrosis visualized.  Left Kidney: Length: 10.8 cm. Echogenicity within normal limits. No mass or hydronephrosis visualized.  Abdominal aorta: No aneurysm visualized.  Other findings: No demonstrable ascites.  IMPRESSION: Study within normal limits.   Electronically Signed   By: Lowella Grip III M.D.   On: 03/29/2015 08:43   Dg Chest Port 1 View  04/21/2015   CLINICAL DATA:  Pneumonia, extubated  EXAM: PORTABLE CHEST - 1 VIEW  COMPARISON:  04/20/2015  FINDINGS: Stable mild cardiomegaly and minimal interstitial edema pattern throughout the lungs. Improving bibasilar atelectasis. No effusion or pneumothorax. Trachea midline. No acute osseous finding.  IMPRESSION: Cardiomegaly with minimal interstitial edema.  Improving bibasilar atelectasis   Electronically Signed   By: Jerilynn Mages.  Shick M.D.   On: 04/21/2015 07:54   Dg Chest Port 1 View  04/20/2015   CLINICAL DATA:  Acute respiratory failure.  EXAM: PORTABLE CHEST - 1 VIEW  COMPARISON:  04/19/2015.  FINDINGS: Endotracheal tube and NG tube in good anatomic position. Cardiomegaly with interim significant clearing of pulmonary infiltrates consistent with clearing pulmonary edema. Mild to moderate moderate residual. No pleural effusion or pneumothorax. Left costophrenic angle not imaged.  IMPRESSION: 1. Lines and tubes in stable position. 2. Partial clearing of congestive heart failure and pulmonary edema.   Electronically Signed   By: Marcello Moores  Register   On: 04/20/2015 08:03   Dg Chest Port 1 View  04/19/2015   CLINICAL DATA:  Endotracheal tube placement  EXAM: PORTABLE CHEST - 1 VIEW  COMPARISON:  03/13/1999  FINDINGS: Endotracheal tube with tip between the clavicular heads and carina. The orogastric tube reaches the stomach at least.  There is interlobular septal thickening and Kerley lines consistent with pulmonary edema, also seen on the contemporaneous cervical spine CT. Opacity is asymmetric to the right. Normal heart size and mediastinal contours.  IMPRESSION: 1. Endotracheal and orogastric tubes are in good position. 2. Pulmonary edema. 3. Asymmetric opacification of the right chest could be from asymmetric edema or superimposed pneumonia/aspiration.   Electronically Signed   By: Monte Fantasia M.D.   On: 04/19/2015 01:59         Subjective: Patient denies fevers, chills, headache, chest pain, dyspnea, nausea, vomiting, diarrhea, abdominal pain, dysuria, hematuria   Objective: Filed Vitals:   04/24/15 0951 04/24/15 0952 04/24/15 0955 04/24/15 1148  BP: 121/66  129/63 115/58  Pulse: 113 110 108 89  Temp:      TempSrc:      Resp:      Height:      Weight:      SpO2:        Intake/Output Summary (Last 24 hours) at 04/24/15 1328 Last data filed at 04/24/15 1106  Gross per 24 hour  Intake    400 ml  Output    950 ml  Net   -550 ml   Weight change: 0.8 kg (1 lb 12.2 oz) Exam:   General:  Pt is alert, follows commands appropriately, not in acute distress  HEENT: No icterus, No thrush, No meningismus, [place]/AT  Cardiovascular: RRR, S1/S2, no rubs, no gallops  Respiratory: CTA bilaterally, no wheezing, no crackles, no rhonchi  Abdomen: Soft/+BS, non tender, non distended, no guarding  Extremities: No edema, No lymphangitis, No petechiae, No rashes, no synovitis  Data Reviewed: Basic Metabolic Panel:  Recent Labs Lab 04/19/15 0430 04/20/15 0209 04/21/15 0211 04/22/15 0444 04/22/15 1150 04/23/15 0437 04/24/15 0455  NA 137 138 139 139  --  138 137  K 5.1 3.7 3.3* 3.2*  --  3.9 3.5  CL 104 108 108 107  --  105 105  CO2 21* 23 23 25   --  23 22  GLUCOSE 183* 177* 176* 157*  --  292* 195*  BUN 20 12 8 9   --  8 11  CREATININE 0.85 0.83 0.77 0.63  --  0.85 0.64  CALCIUM 8.3* 7.7* 7.6* 7.8*  --  7.9* 7.7*  MG 2.2  --   --   --  1.9  --   --   PHOS 2.4*  --   --   --   --   --   --    Liver Function Tests:  Recent Labs Lab 04/19/15 0051 04/21/15 0211  AST 50* 61*  ALT 17 19  ALKPHOS 71 46  BILITOT 0.4 0.5  PROT 6.7 5.4*  ALBUMIN 3.2* 2.7*   No results for input(s): LIPASE, AMYLASE in the last 168 hours.  Recent Labs Lab 04/20/15 1018 04/22/15 1150  AMMONIA 94* 33   CBC:  Recent Labs Lab 04/19/15 0051 04/19/15 0059 04/19/15 0430 04/20/15 0209 04/21/15 0211  04/24/15 0455  WBC 11.8*  --  11.2* 7.9 5.8 4.6  NEUTROABS 10.2*  --   --   --  4.1  --   HGB 11.9* 13.6 11.7* 10.0* 9.1* 10.1*  HCT 37.1* 40.0 36.0* 31.6* 28.3* 31.2*  MCV 87.5  --  87.0 87.3 86.8 87.9  PLT 307  --  260 179 155 159   Cardiac Enzymes:  Recent Labs Lab 04/19/15 0130 04/20/15 0209 04/20/15 1306  CKTOTAL 490* 378  --   TROPONINI  --   --  1.01*   BNP: Invalid input(s): POCBNP CBG:  Recent Labs Lab 04/23/15 1217 04/23/15 1648 04/23/15 2131 04/24/15 0807 04/24/15 1159  GLUCAP 251* 273* 181* 228* 206*    Recent Results (from the past 240 hour(s))  MRSA PCR Screening     Status: None   Collection Time: 04/19/15  3:38 AM  Result Value Ref Range Status   MRSA by PCR NEGATIVE NEGATIVE Final    Comment:        The GeneXpert MRSA Assay (FDA approved for NASAL specimens only), is one component of a comprehensive MRSA colonization surveillance program. It is not intended to diagnose MRSA infection nor to guide or monitor treatment for MRSA infections.      Scheduled Meds: . ampicillin-sulbactam (UNASYN) IV  3 g Intravenous Q8H  . antiseptic oral rinse  7 mL Mouth Rinse QID  . aspirin EC  325 mg Oral Daily  . carvedilol  3.125 mg Oral BID WC  . chlorhexidine  15 mL Mouth Rinse BID  . heparin  5,000 Units Subcutaneous 3 times per day  . insulin aspart  0-15 Units Subcutaneous TID WC  . insulin aspart  0-5 Units Subcutaneous QHS  . insulin aspart  3 Units Subcutaneous TID WC  . insulin glargine  25 Units Subcutaneous QHS  . lisinopril  5 mg Oral BID  . pantoprazole  40 mg Oral Q1200  . potassium chloride  20 mEq Oral Once   Continuous Infusions: . sodium chloride 10 mL/hr at 04/20/15 1800     Verniece Encarnacion, DO  [facility] Pager [phone]  If 7PM-7AM, please contact night-coverage [email or web address] Password [email or web address] 04/24/2015, 1:28 PM   LOS: 5 days

## 2015-04-24 NOTE — Clinical Social Work Note (Signed)
Clinical Social Work Assessment  Patient Details  Name: Dale Morrow MRN: [id number] Date of Birth: [birth year]  Date of referral:  04/24/15               Reason for consult:  Discharge Planning, Facility Placement                Permission sought to share information with:  Facility Sport and exercise psychologist, Family Supports Permission granted to share information::  Yes, Verbal Permission Granted  Name::     Materials engineer::  SNFs  Relationship::     Contact Information:     Housing/Transportation Living arrangements for the past 2 months:  [place] of Information:  Adult Children Patient Interpreter Needed:  None Criminal Activity/Legal Involvement Pertinent to Current Situation/Hospitalization:  No - Comment as needed Significant Relationships:  Spouse Lives with:  Spouse Do you feel safe going back to the place where you live?  Yes Need for family participation in patient care:  Yes (Comment)  Care giving concerns:  Patient's wife agrees with rec for patient to be placed in SNF at discharge. She prefers [facility].   Social Worker assessment / plan:  CSW spoke with patient's wife Dale Morrow by phone to complete assessment. Dale Morrow states that the patient lives with her at home. She states that she would like the patient to go to [facility] at discharge due to his increased needs. CSW explained SNF search/placement process and answered Gail's questions. CSW will follow up with bed offers.   Employment status:  Disabled (Comment on whether or not currently receiving Disability), Retired Forensic scientist:  Medicare, Managed Care PT Recommendations:    Information / Referral to community resources:  [place]  Patient/Family's Response to care:  Patient's wife is happy with the care the patient is receiving at this time. She agrees with PT recs.   Patient/Family's Understanding of and Emotional Response to Diagnosis, Current Treatment, and Prognosis:   Patient appears to have little insight into reason for admission, diagnosis, and post DC needs due to confusion. Patient's wife has clear understanding of all of this.   Emotional Assessment Appearance:  Appears stated age Attitude/Demeanor/Rapport:  Other (Confused) Affect (typically observed):  Other (Confused) Orientation:  Oriented to Self Alcohol / Substance use:  Tobacco Use (Hx) Psych involvement (Current and /or in the community):  No (Comment)  Discharge Needs  Concerns to be addressed:  Discharge Planning Concerns Readmission within the last 30 days:  No Current discharge risk:  Physical Impairment, Chronically ill Barriers to Discharge:  Continued Medical Work up   [employer] MSW, [place], [place], [phone]

## 2015-04-24 NOTE — Progress Notes (Signed)
Physical Therapy Treatment Patient Details Name: Dale Morrow MRN: [id number] DOB: 1941/07/26 Today's Date: 04/24/2015    History of Present Illness 74 yo male with onset of seizures pre-admit and CHF with cardiomyopathy, PMHx- Lt knee revision 01/2015; DM; tremor    PT Comments    Family not present to confirm, however appears to have continued declined cognition (compared to baseline)--decr safety awareness related to use of RW during transfer and turning, slow processing, decr initiation/slow to follow commands, became labile. Pt with decr recall of knee exercises (was still undergoing OP rehab PTA--per pt--and has been doing some of these exercises since 01/2015 surgery). Chair alarm in place due to decr cognition.   Follow Up Recommendations  SNF;Supervision/Assistance - 24 hour (pt seems to have continued cognitive decline)     Equipment Recommendations  None recommended by PT    Recommendations for Other Services       Precautions / Restrictions Precautions Precautions: Fall Restrictions Weight Bearing Restrictions: No    Mobility  Bed Mobility Overal bed mobility: Modified Independent Bed Mobility: Supine to Sit     Supine to sit: Modified independent (Device/Increase time)     General bed mobility comments: flat, no rail; incr time, effort  Transfers Overall transfer level: Needs assistance Equipment used: Rolling walker (2 wheeled);1 person hand held assist Transfers: Sit to/from Stand Sit to Stand: Min assist Stand pivot transfers: Min assist       General transfer comment: vc for safe use of RW (pt started to pull on RW with it tipping posterior); steady assist as transitioning his hands from bed toRW  Ambulation/Gait Ambulation/Gait assistance: Min assist Ambulation Distance (Feet): 90 Feet Assistive device: Rolling walker (2 wheeled);1 person hand held assist Gait Pattern/deviations: Step-through pattern;Decreased stride length;Trunk flexed Gait  velocity: reduced Gait velocity interpretation: Below normal speed for age/gender General Gait Details: very slow pace, although able to incr with vc and assist; assist when turning with RW to prevent crossing his feet (trying to swing walker around vs taking small steps)   Stairs            Wheelchair Mobility    Modified Rankin (Stroke Patients Only)       Balance     Sitting balance-Leahy Scale: Good       Standing balance-Leahy Scale: Fair                      Cognition Arousal/Alertness: Awake/alert Behavior During Therapy: Anxious Overall Cognitive Status: No family/caregiver present to determine baseline cognitive functioning Area of Impairment: Memory;Following commands;Problem solving     Memory: Decreased short-term memory Following Commands: Follows one step commands with increased time Safety/Judgement: Decreased awareness of safety Awareness: Intellectual Problem Solving: Slow processing General Comments: Pt unable to recall timing of OT session (felt he had been in chair 2 hours, when he was already back to bed at 1405 for IV team)    Exercises Total Joint Exercises Ankle Circles/Pumps: AROM;Both;10 reps Long Arc Quad: AAROM;Left;5 reps (with endrange stretch into knee extension) Knee Flexion: AAROM;Left;5 reps;Seated (hold each incr ROM x 30 seconds) Goniometric ROM: ~ 70 AROM, ~90 AAROM General Exercises - Lower Extremity Toe Raises: AROM;Both;10 reps;Seated Heel Raises: AROM;Both;10 reps;Seated    General Comments        Pertinent Vitals/Pain Pain Assessment: Faces Faces Pain Scale: Hurts even more Pain Location: Lt knee with ROM Pain Descriptors / Indicators: Guarding;Grimacing Pain Intervention(s): Limited activity within patient's tolerance;Monitored during session;Repositioned  Home Living Family/patient expects to be discharged to:: Private residence Living Arrangements: Spouse/significant other Available Help at  Discharge: Family Type of Home: House Home Access: Stairs to enter   [place]: Two level;Bed/bath upstairs;1/2 bath on main level   Additional Comments: Pt is poor historian and cannot completely get details from him    Prior Function Level of Independence: Independent with assistive device(s)      Comments: Per pt   PT Goals (current goals can now be found in the care plan section) Acute Rehab PT Goals Patient Stated Goal: Pt [facility] to go to [facility] for rehab. Time For Goal Achievement: 05/06/15 Progress towards PT goals: Progressing toward goals    Frequency  Min 2X/week    PT Plan Current plan remains appropriate    Co-evaluation             End of Session Equipment Utilized During Treatment: Gait belt Activity Tolerance: Patient tolerated treatment well Patient left: in chair;with call bell/phone within reach;with chair alarm set     Time: 1419-1443 PT Time Calculation (min) (ACUTE ONLY): 24 min  Charges:  $Gait Training: 8-22 mins $Therapeutic Exercise: 8-22 mins                    G Codes:      Dale Morrow 04/28/15, 2:58 PM Pager [phone]

## 2015-04-25 ENCOUNTER — Encounter (HOSPITAL_COMMUNITY): Payer: Self-pay | Admitting: Neurology

## 2015-04-25 ENCOUNTER — Telehealth: Payer: Self-pay | Admitting: Cardiology

## 2015-04-25 DIAGNOSIS — I5023 Acute on chronic systolic (congestive) heart failure: Secondary | ICD-10-CM

## 2015-04-25 LAB — GLUCOSE, CAPILLARY
GLUCOSE-CAPILLARY: 157 mg/dL — AB (ref 65–99)
Glucose-Capillary: 90 mg/dL (ref 65–99)
Glucose-Capillary: 125 mg/dL — ABNORMAL HIGH (ref 65–99)
Glucose-Capillary: 94 mg/dL (ref 65–99)

## 2015-04-25 LAB — BASIC METABOLIC PANEL
ANION GAP: 10 (ref 5–15)
BUN: 10 mg/dL (ref 6–20)
CHLORIDE: 107 mmol/L (ref 101–111)
CO2: 23 mmol/L (ref 22–32)
Calcium: 8.2 mg/dL — ABNORMAL LOW (ref 8.9–10.3)
Creatinine, Ser: 0.57 mg/dL — ABNORMAL LOW (ref 0.61–1.24)
GFR calc Af Amer: >60 mL/min (ref >60)
GFR calc non Af Amer: >60 mL/min (ref >60)
Glucose, Bld: 74 mg/dL (ref 65–99)
Potassium: 3.7 mmol/L (ref 3.5–5.1)
SODIUM: 140 mmol/L (ref 135–145)

## 2015-04-25 LAB — CLOSTRIDIUM DIFFICILE BY PCR: CDIFFPCR: NEGATIVE

## 2015-04-25 MED ORDER — CARVEDILOL 3.125 MG PO TABS: 3.1250 mg | ORAL_TABLET | Freq: Two times a day (BID) | ORAL | Status: DC

## 2015-04-25 MED ORDER — AMOXICILLIN-POT CLAVULANATE 875-125 MG PO TABS
1.0000 | ORAL_TABLET | Freq: Two times a day (BID) | ORAL | Status: DC
  Administered 2015-04-25 (×2): 1 via ORAL
  Filled 2015-04-25 (×4): qty 1

## 2015-04-25 MED ORDER — CLONAZEPAM 0.5 MG PO TABS: 0.5000 mg | ORAL_TABLET | Freq: Two times a day (BID) | ORAL | Status: DC | PRN

## 2015-04-25 MED ORDER — ASPIRIN 81 MG PO CHEW
81.0000 mg | CHEWABLE_TABLET | ORAL | Status: AC
  Administered 2015-04-26: 81 mg via ORAL
  Filled 2015-04-25: qty 1

## 2015-04-25 MED ORDER — HYDROCODONE-ACETAMINOPHEN 5-325 MG PO TABS: 1.0000 | ORAL_TABLET | Freq: Three times a day (TID) | ORAL | Status: DC

## 2015-04-25 MED ORDER — AMOXICILLIN-POT CLAVULANATE 875-125 MG PO TABS: 1.0000 | ORAL_TABLET | Freq: Two times a day (BID) | ORAL | Status: DC

## 2015-04-25 MED ORDER — LISINOPRIL 5 MG PO TABS: 5.0000 mg | ORAL_TABLET | Freq: Two times a day (BID) | ORAL | Status: DC

## 2015-04-25 MED ORDER — SODIUM CHLORIDE 0.9 % IV SOLN
INTRAVENOUS | Status: DC
  Administered 2015-04-26: 06:00:00 via INTRAVENOUS

## 2015-04-25 MED ORDER — INSULIN GLARGINE 100 UNIT/ML ~~LOC~~ SOLN: 25.0000 [IU] | Freq: Every day | SUBCUTANEOUS | Status: DC

## 2015-04-25 MED ORDER — INSULIN GLARGINE 100 UNIT/ML ~~LOC~~ SOLN
12.0000 [IU] | Freq: Every day | SUBCUTANEOUS | Status: DC
  Administered 2015-04-25: 12 [IU] via SUBCUTANEOUS
  Filled 2015-04-25 (×2): qty 0.12

## 2015-04-25 MED ORDER — INSULIN ASPART 100 UNIT/ML ~~LOC~~ SOLN: 3.0000 [IU] | Freq: Three times a day (TID) | SUBCUTANEOUS | Status: DC

## 2015-04-25 NOTE — Clinical Social Work Note (Signed)
DC cancelled for patient. PTAR, facility, and wife updated. CSW will continue to follow.   Liz Beach MSW, [place], [place], [phone]

## 2015-04-25 NOTE — Progress Notes (Signed)
Reason for follow up: Cardiology is going to bring patient for left heart cath and wanted neurology to see patient prior to cath due to possible risk of stroke.    Subjective: Much more alert and bale to follow commands since last exam. No complaints.  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   Objective: Current vital signs: BP 120/73 mmHg  Pulse 78  Temp(Src) 98.4 F (36.9 C) (Oral)  Resp 18  Ht 5\' 8"  (1.727 m)  Wt 87.1 kg (192 lb 0.3 oz)  BMI 29.20 kg/m2  SpO2 100% Vital signs in last 24 hours: Temp:  [98 F (36.7 C)-98.8 F (37.1 C)] 98.4 F (36.9 C) (07/12 0527) Pulse Rate:  [77-93] 78 (07/12 0846) Resp:  [18-20] 18 (07/12 0527) BP: (102-131)/(63-73) 120/73 mmHg (07/12 0846) SpO2:  [99 %-100 %] 100 % (07/12 0527) Weight:  [87.1 kg (192 lb 0.3 oz)] 87.1 kg (192 lb 0.3 oz) (07/12 0527)  Intake/Output from previous day: 07/11 0701 - 07/12 0700 In: 320 [P.O.:120; IV Piggyback:200] Out: 450 [Urine:450] Intake/Output this shift: Total I/O In: 240 [P.O.:240] Out: -  Nutritional status: Diet Carb Modified Fluid consistency:: Thin; Room service appropriate?: Yes Diet - low sodium heart healthy  Neurologic Exam: General: Mental Status: Alert, oriented, thought content appropriate.  Speech fluent without evidence of aphasia.  Able to follow 3 step commands without difficulty. Cranial Nerves: II: Visual fields grossly normal, pupils equal, round, reactive to light and accommodation III,IV, VI: ptosis present left eye (old), extra-ocular motions intact bilaterally V,VII: smile asymmetric on the left with decreased righ NL, facial light touch sensation normal bilaterally, decreased movement of left frontalis VIII: hearing normal bilaterally IX,X: uvula rises symmetrically XI: bilateral shoulder shrug XII: midline tongue extension without atrophy or fasciculations  Motor: Right : Upper extremity   5/5    Left:     Upper extremity    5/5  Lower extremity   5/5     Lower extremity   5/5 Tone and bulk:normal tone throughout; no atrophy noted Sensory: Pinprick and light touch intact throughout, bilaterally Deep Tendon Reflexes:  Right: Upper Extremity   Left: Upper extremity   biceps (C-5 to C-6) 1/4   biceps (C-5 to C-6) 1/4 tricep (C7) 1/4    triceps (C7) 1/4 Brachioradialis (C6) 1/4  Brachioradialis (C6) 1/4  Lower Extremity Lower Extremity  quadriceps (L-2 to L-4) 0/4   quadriceps (L-2 to L-4) 0/4 Achilles (S1) 0/4   Achilles (S1) 0/4  Plantars: Right: downgoing   Left: downgoing Cerebellar: normal finger-to-nose,  normal heel-to-shin test    Lab Results: Basic Metabolic Panel:  Recent Labs Lab 04/19/15 0430  04/21/15 0211 04/22/15 0444 04/22/15 1150 04/23/15 0437 04/24/15 0455 04/25/15 0628  NA 137  < > 139 139  --  138 137 140  K 5.1  < > 3.3* 3.2*  --  3.9 3.5 3.7  CL 104  < > 108 107  --  105 105 107  CO2 21*  < > 23 25  --  23 22 23   GLUCOSE 183*  < > 176* 157*  --  292* 195* 74  BUN 20  < > 8 9  --  8 11 10   CREATININE 0.85  < > 0.77 0.63  --  0.85 0.64 0.57*  CALCIUM 8.3*  < > 7.6* 7.8*  --  7.9* 7.7* 8.2*  MG 2.2  --   --   --  1.9  --   --   --   PHOS 2.4*  --   --   --   --   --   --   --   < > = values in this interval not displayed.  Liver Function Tests:  Recent Labs Lab 04/19/15 0051 04/21/15 0211  AST 50* 61*  ALT 17 19  ALKPHOS 71 46  BILITOT 0.4 0.5  PROT 6.7 5.4*  ALBUMIN 3.2* 2.7*   No results for input(s): LIPASE, AMYLASE in the last 168 hours.  Recent Labs Lab 04/20/15 1018 04/22/15 1150  AMMONIA 94* 33    CBC:  Recent Labs Lab 04/19/15 0051 04/19/15 0059 04/19/15 0430 04/20/15 0209 04/21/15 0211 04/24/15 0455  WBC 11.8*  --  11.2* 7.9 5.8 4.6  NEUTROABS 10.2*  --   --   --  4.1  --   HGB 11.9* 13.6 11.7* 10.0* 9.1* 10.1*  HCT 37.1* 40.0 36.0* 31.6* 28.3* 31.2*  MCV 87.5  --  87.0 87.3 86.8 87.9  PLT 307  --  260 179 155 159    Cardiac  Enzymes:  Recent Labs Lab 04/19/15 0130 04/20/15 0209 04/20/15 1306  CKTOTAL 490* 378  --   TROPONINI  --   --  1.01*    Lipid Panel:  Recent Labs Lab 04/19/15 0430  TRIG 63    CBG:  Recent Labs Lab 04/24/15 1159 04/24/15 1733 04/24/15 2153 04/25/15 0806 04/25/15 1202  GLUCAP 206* 158* 115* 89 125*    Microbiology: Results for orders placed or performed during the hospital encounter of 04/19/15  MRSA PCR Screening     Status: None   Collection Time: 04/19/15  3:38 AM  Result Value Ref Range Status   MRSA by PCR NEGATIVE NEGATIVE Final    Comment:        The GeneXpert MRSA Assay (FDA approved for NASAL specimens only), is one component of a comprehensive MRSA colonization surveillance program. It is not intended to diagnose MRSA infection nor to guide or monitor treatment for MRSA infections.   Clostridium Difficile by PCR (not at [facility])     Status: None   Collection Time: 04/24/15  8:35 PM  Result Value Ref Range Status   C difficile by pcr NEGATIVE NEGATIVE Final    Coagulation Studies: No results for input(s): LABPROT, INR in the last 72 hours.  Imaging: Nm Myocar Multi W/spect W/wall Motion / Ef  04/24/2015    There was no ST segment deviation noted during stress.  Findings consistent with prior myocardial infarction.  This is a high risk study.  The left ventricular ejection fraction is severely decreased (<30%).   Fixed moderate size and intensity, mid to distal anterior, apical and  inferior wall defects consistent with scar. There is associated akinesis  in these areas, overlying generalized severe hypokinesis of the ventricle.  LVEF is 29%. This could suggest multivessel CAD. No significant reversible  ischemia was noted. This is a high risk study.  Pixie Casino, MD, [facility] Board Certified in Nuclear Cardiology Attending Cardiologist [facility]     Medications:  Scheduled: . amoxicillin-clavulanate  1 tablet Oral Q12H  . antiseptic  oral rinse  7 mL Mouth Rinse QID  . aspirin EC  325 mg Oral Daily  . carvedilol  3.125 mg Oral BID WC  . chlorhexidine  15 mL Mouth Rinse BID  . heparin  5,000 Units Subcutaneous 3 times per day  . insulin aspart  0-15 Units Subcutaneous  TID WC  . insulin aspart  0-5 Units Subcutaneous QHS  . insulin aspart  3 Units Subcutaneous TID WC  . insulin glargine  25 Units Subcutaneous QHS  . lisinopril  5 mg Oral BID  . pantoprazole  40 mg Oral Q1200    Assessment/Plan: 74 YO male with AMS secondary to hypoglycemia.  Much improved since admission. At this time no further recommendations.  Neurology will S/O   Etta Quill PA-C [facility] [phone]  04/25/2015, 2:22 PM

## 2015-04-25 NOTE — Progress Notes (Signed)
Patient: Dale Morrow / Admit Date: 04/19/2015 / Date of Encounter: 04/25/2015, 10:34 AM   Subjective: Denies CP or SOB. Really alert but still somewhat confused. Wife says this comes and goes. He referred to her as his sister. Michela Pitcher he hoped it was still the 1980s when asked the date.   Objective: Telemetry: NSR Physical Exam: Blood pressure 120/73, pulse 78, temperature 98.4 F (36.9 C), temperature source Oral, resp. rate 18, height 5\' 8"  (1.727 m), weight 192 lb 0.3 oz (87.1 kg), SpO2 100 %. General: Well developed, well nourished, in no acute distress. Head: Normocephalic, atraumatic, sclera non-icteric, no xanthomas, nares are without discharge. Neck: JVP not elevated. Lungs: Clear bilaterally to auscultation without wheezes, rales, or rhonchi. Breathing is unlabored. Heart: RRR S1 S2 without murmurs, rubs, or gallops.  Abdomen: Soft, non-tender, non-distended with normoactive bowel sounds. No rebound/guarding. Extremities: No clubbing or cyanosis. No edema. Distal pedal pulses are 2+ and equal bilaterally. Neuro: Alert and oriented to place, day of the week, month, but not year or wife. Psych: Pleasant affect.   Intake/Output Summary (Last 24 hours) at 04/25/15 1034 Last data filed at 04/24/15 1808  Gross per 24 hour  Intake    320 ml  Output    450 ml  Net   -130 ml    Inpatient Medications:  . amoxicillin-clavulanate  1 tablet Oral Q12H  . antiseptic oral rinse  7 mL Mouth Rinse QID  . aspirin EC  325 mg Oral Daily  . carvedilol  3.125 mg Oral BID WC  . chlorhexidine  15 mL Mouth Rinse BID  . heparin  5,000 Units Subcutaneous 3 times per day  . insulin aspart  0-15 Units Subcutaneous TID WC  . insulin aspart  0-5 Units Subcutaneous QHS  . insulin aspart  3 Units Subcutaneous TID WC  . insulin glargine  25 Units Subcutaneous QHS  . lisinopril  5 mg Oral BID  . pantoprazole  40 mg Oral Q1200   Infusions:  . sodium chloride 10 mL/hr at 04/20/15 1800     Labs:  Recent Labs  04/22/15 1150  04/24/15 0455 04/25/15 0628  NA  --   < > 137 140  K  --   < > 3.5 3.7  CL  --   < > 105 107  CO2  --   < > 22 23  GLUCOSE  --   < > 195* 74  BUN  --   < > 11 10  CREATININE  --   < > 0.64 0.57*  CALCIUM  --   < > 7.7* 8.2*  MG 1.9  --   --   --   < > = values in this interval not displayed. No results for input(s): AST, ALT, ALKPHOS, BILITOT, PROT, ALBUMIN in the last 72 hours.  Recent Labs  04/24/15 0455  WBC 4.6  HGB 10.1*  HCT 31.2*  MCV 87.9  PLT 159   No results for input(s): CKTOTAL, CKMB, TROPONINI in the last 72 hours. Invalid input(s): POCBNP  Recent Labs  04/22/15 1150  HGBA1C 7.3*     Radiology/Studies:  Ct Head Wo Contrast  04/19/2015   CLINICAL DATA:  Found down and unresponsive.  EXAM: CT HEAD WITHOUT CONTRAST  CT CERVICAL SPINE WITHOUT CONTRAST  TECHNIQUE: Multidetector CT imaging of the head and cervical spine was performed following the standard protocol without intravenous contrast. Multiplanar CT image reconstructions of the cervical spine were also generated.  COMPARISON:  None.  FINDINGS: CT HEAD FINDINGS  Skull and Sinuses:Negative for fracture or destructive process. The mastoids, middle ears, and imaged paranasal sinuses are clear.  Orbits: Bilateral cataract resection.  Brain: No evidence of acute infarction, hemorrhage, hydrocephalus, or mass lesion/mass effect. Generalized cerebral volume loss.  CT CERVICAL SPINE FINDINGS  Negative for acute fracture or subluxation. No prevertebral edema. No gross cervical canal hematoma. Multilevel degenerative disc narrowing and facet arthropathy. No significant osseous canal or foraminal stenosis. No endplate erosion or focal bone lesion.  Interlobular septal thickening at the apices.  IMPRESSION: 1. No acute intracranial or cervical spine findings. 2. Pulmonary edema.   Electronically Signed   By: Monte Fantasia M.D.   On: 04/19/2015 01:22   Ct Cervical Spine Wo  Contrast  04/19/2015   CLINICAL DATA:  Found down and unresponsive.  EXAM: CT HEAD WITHOUT CONTRAST  CT CERVICAL SPINE WITHOUT CONTRAST  TECHNIQUE: Multidetector CT imaging of the head and cervical spine was performed following the standard protocol without intravenous contrast. Multiplanar CT image reconstructions of the cervical spine were also generated.  COMPARISON:  None.  FINDINGS: CT HEAD FINDINGS  Skull and Sinuses:Negative for fracture or destructive process. The mastoids, middle ears, and imaged paranasal sinuses are clear.  Orbits: Bilateral cataract resection.  Brain: No evidence of acute infarction, hemorrhage, hydrocephalus, or mass lesion/mass effect. Generalized cerebral volume loss.  CT CERVICAL SPINE FINDINGS  Negative for acute fracture or subluxation. No prevertebral edema. No gross cervical canal hematoma. Multilevel degenerative disc narrowing and facet arthropathy. No significant osseous canal or foraminal stenosis. No endplate erosion or focal bone lesion.  Interlobular septal thickening at the apices.  IMPRESSION: 1. No acute intracranial or cervical spine findings. 2. Pulmonary edema.   Electronically Signed   By: Monte Fantasia M.D.   On: 04/19/2015 01:22   Mr Brain Wo Contrast  04/19/2015   CLINICAL DATA:  Altered mental status.  Unresponsive.  EXAM: MRI HEAD WITHOUT CONTRAST  TECHNIQUE: Multiplanar, multiecho pulse sequences of the brain and surrounding structures were obtained without intravenous contrast.  COMPARISON:  CT head 04/19/2015  FINDINGS: Mild to moderate atrophy. Mild ventricular enlargement consistent with the level of atrophy.  Negative for acute infarct. Minimal chronic microvascular ischemic change in the white matter.  Pituitary normal in size. Bilateral lens replacement. No orbital mass. Paranasal sinuses clear.  Negative for intracranial hemorrhage or fluid collection.  Negative for mass or edema.  No shift of the midline structures.  IMPRESSION: Generalized  atrophy.  Minimal chronic microvascular ischemia.  No acute abnormality.   Electronically Signed   By: Franchot Gallo M.D.   On: 04/19/2015 12:37   US Abdomen Complete  03/29/2015   CLINICAL DATA:  Generalized abdominal pain  EXAM: ULTRASOUND ABDOMEN COMPLETE  COMPARISON:  CT abdomen and pelvis Mar 10, 2015  FINDINGS: Gallbladder: No gallstones or wall thickening visualized. There is no pericholecystic fluid. No sonographic Murphy sign noted.  Common bile duct: Diameter: 5 mm. There is no intrahepatic, common hepatic, or common bile duct dilatation.  Liver: No focal lesion identified. Within normal limits in parenchymal echogenicity. A tiny cyst near the dome of the liver seen on CT is not appreciable on this ultrasound examination.  IVC: No abnormality visualized.  Pancreas: No mass or inflammatory focus.  Spleen: Size and appearance within normal limits.  Right Kidney: Length: 10.7 cm. Echogenicity within normal limits. No mass or hydronephrosis visualized.  Left Kidney: Length: 10.8 cm. Echogenicity within normal limits. No mass or hydronephrosis visualized.  Abdominal aorta: No aneurysm visualized.  Other findings: No demonstrable ascites.  IMPRESSION: Study within normal limits.   Electronically Signed   By: Lowella Grip III M.D.   On: 03/29/2015 08:43   Nm Myocar Multi W/spect W/wall Motion / Ef  04/24/2015    There was no ST segment deviation noted during stress.  Findings consistent with prior myocardial infarction.  This is a high risk study.  The left ventricular ejection fraction is severely decreased (<30%).   Fixed moderate size and intensity, mid to distal anterior, apical and  inferior wall defects consistent with scar. There is associated akinesis  in these areas, overlying generalized severe hypokinesis of the ventricle.  LVEF is 29%. This could suggest multivessel CAD. No significant reversible  ischemia was noted. This is a high risk study.  Pixie Casino, MD, [facility] Board  Certified in Nuclear Cardiology Attending Cardiologist [place]    [place] 1 View  04/21/2015   CLINICAL DATA:  Pneumonia, extubated  EXAM: PORTABLE CHEST - 1 VIEW  COMPARISON:  04/20/2015  FINDINGS: Stable mild cardiomegaly and minimal interstitial edema pattern throughout the lungs. Improving bibasilar atelectasis. No effusion or pneumothorax. Trachea midline. No acute osseous finding.  IMPRESSION: Cardiomegaly with minimal interstitial edema.  Improving bibasilar atelectasis   Electronically Signed   By: Jerilynn Mages.  Shick M.D.   On: 04/21/2015 07:54   Dg Chest Port 1 View  04/20/2015   CLINICAL DATA:  Acute respiratory failure.  EXAM: PORTABLE CHEST - 1 VIEW  COMPARISON:  04/19/2015.  FINDINGS: Endotracheal tube and NG tube in good anatomic position. Cardiomegaly with interim significant clearing of pulmonary infiltrates consistent with clearing pulmonary edema. Mild to moderate moderate residual. No pleural effusion or pneumothorax. Left costophrenic angle not imaged.  IMPRESSION: 1. Lines and tubes in stable position. 2. Partial clearing of congestive heart failure and pulmonary edema.   Electronically Signed   By: Marcello Moores  Register   On: 04/20/2015 08:03   Dg Chest Port 1 View  04/19/2015   CLINICAL DATA:  Endotracheal tube placement  EXAM: PORTABLE CHEST - 1 VIEW  COMPARISON:  03/13/1999  FINDINGS: Endotracheal tube with tip between the clavicular heads and carina. The orogastric tube reaches the stomach at least.  There is interlobular septal thickening and Kerley lines consistent with pulmonary edema, also seen on the contemporaneous cervical spine CT. Opacity is asymmetric to the right. Normal heart size and mediastinal contours.  IMPRESSION: 1. Endotracheal and orogastric tubes are in good position. 2. Pulmonary edema. 3. Asymmetric opacification of the right chest could be from asymmetric edema or superimposed pneumonia/aspiration.   Electronically Signed   By: Monte Fantasia M.D.   On:  04/19/2015 01:59     Assessment and Plan  33M with DM, prostate CA s/p XRT, depression admitted after being found with unresponsive, posturing, acute respiratory failure with severe hypoglycemia. Initially felt possibly due to seizure but neuro later felt his AMS was likely secondary to metabolic encephalopathy with prolonged hypoglycemia given that he had had no electrographic seizure activity and MRI was normal. Workup revealed echo 04/19/15 with EF 25-30% with +WMA (?Takotsubo's), PASP 84mmHg.  1. Newly diagnosed cardiomyopathy/elevated troponin - incidental pickup this admission. Nuclear stress test showed EF 29% with several defects c/w scar; EF 29%, could suggest multivessel CAD but no significant reversible ischemia noted. Will clarify whether this means medical therapy only with Dr. Harrington Challenger. Continue BB, ACEI. BP too low to add spironolactone right now. Continue aspirin but consider decreasing  to 81mg  daily unless he is on this for other reasons.  2. Anemia - appears stable over the most recent check.  3. AMS with residual delirium - felt due to prolonged hypoglycemia. Still somewhat confused. Emotional  SOme assymmetry of facial movement noted  Will discuss with neuro    Signed, Burna Mortimer Pager: [phone]   Patient seen and examined  I have amended note above by D Dunn to reflect my findings Pt's myoview yesterday was not normal  Showed evid of potential scar, though may also be seen with multivessel dz with low flow state.  LVEF is down.  Would not expect this necessarily with an event from coronary spasm  He does have risk increased with DM and evid of vascular dz elsehere (seen on prior CT scans of abdomen)   I reviewed this with pt.  I would recomm a left heart cath to confirm/define his anatomy prior to d/c to rehab.  The patient became emotional  Understands need but was upset. This would potentially delay d/c by at least a day.  He is agreeable to proceed.  Dorris Carnes

## 2015-04-25 NOTE — Discharge Summary (Signed)
Physician Discharge Summary  Dale Morrow JJH:[id number] DOB: Nov 15, 1940 DOA: 04/19/2015  PCP: Jonathon Bellows, MD  Admit date: 04/19/2015 Discharge date: 04/25/2015  Recommendations for Outpatient Follow-up:  1. Pt will need to follow up with PCP in 2 weeks post discharge 2. Please obtain BMP and CBC in one week 3. Augmentin 875/25 bid x one more day 4. Please check CBGs before each meal and at bedtime  Discharge Diagnoses:   Acute respiratory failure -Thought to be secondary to seizure activity and pulmonary edema - extubated on 04/20/2015 -presently stable without increased WOB -Presently stable on room air Elevated troponin -Likely due to demand ischemia and cardiomyopathy - cardiology was consulted after Echocardiogram revealed ejection fraction of 25% - EKG without any concerning ischemic changes Nonischemic cardiomyopathy - appreciate cardiology consultation - Thought to be Takotsubo cardiomyopathy  -Echo 04/19/2015 EF 25-30%, akinesis of mid-apical anteroseptal, anterolateral, inferolateral, inferoseptal and apical myodium, with basal sparing. characteristics of takotsubo cardiomyoapthy -Continue lisinopril and carvedilol - plan to repeat echocardiogram in 2 weeks - Lexiscan 04/24/2015--akinesis in these areas, overlying generalized severe hypokinesis of the ventricle. LVEF is 29% , but no reversible ischemia Hematuria -Suspect Foley, as the patient was agitated early in the morning 04/23/2015 -Repeat CBC--hemoglobin stable -Discontinue Foley catheter -urine clearing--no further hematuria -able to urinate spontaneously after foley removed Seizure Like Activity/ Acute encephalopathy -multifactorial including hypoglycemia, cardiomyopathy, lactic acidosis with hypoperfusion (and transient hyperammonemia) and aspiration pneumonia -Neurology was consulted -Initial EEG is consistent with a generalized non-specific cerebral dysfunction(encephalopathy) without seizure  predisposition - patient continued to have increase tone and rigidity - Repeat EEG 04/20/2015 did not show any epileptiform discharges during an episode of increased tone and posturing - Mental status continues to improve daily - Keppra was subsequently discontinued - MRI brain negative for acute findings -check B12--850 -TSH--0.551 -Discontinue Mysoline, trazodone, Inderal Aspiration pneumonia - continue Unasyn D#6 - D/c with Augmentin x 1 additional day - diarrhea--neg for cdiff -stable on RA Hyerammonemia -if remains elevated, then further investigations warranted -YJEHUDJ-->49 Metabolic acidosis -Question whether this was related to the patient's seizure activity -Patient had elevated CPK -resolved Diabetes mellitus type 1 -Patient had ketonuria initially, but did not have hyperglycemia -As the patient remains intermittently confused, will not restart insulin pump at this time -Increase Lantus to 25 units daily  -change to moderate SSI -add novolog 3 units with meals -will need outpt followup with Dr. Buddy Duty Hypokalemia - replete - check magnesium--1.9  Discharge Condition: stable  Disposition:  Follow-up Information    Follow up with Richardson Dopp, PA-C.   Specialties:  Physician Assistant, Radiology, Interventional Cardiology   Why:  [facility] - 05/12/15 at 12:10pm for cardiology follow-up. Nicki Reaper is one of our PAs that works with Dr. Marlou Porch.)   Contact information:   [address] [place] [address] [phone]     SNF  Diet:Carb modified Wt Readings from Last 3 Encounters:  04/25/15 87.1 kg (192 lb 0.3 oz)  03/28/15 85.276 kg (188 lb)  01/25/15 85.276 kg (188 lb)    History of present illness:  74 year old male with a history of diabetes mellitus, prostate cancer (s/p XRT), depression presented to the emergency department after being found unresponsive by his wife at 11:45 PM on 04/18/2015. The patient was last seen normal around  3:30 PM on 04/18/2015. He was noted to be rigid by EMS. Initial CBG was 3:30 and responded well to D50. He was agitated in the emergency department, and he was noted  to be posturing.. CT of the brain was unremarkable. The patient was intubated in the emergency department. He was noted to be hypertensive with systolic blood pressure in the 200s initially. Neurology was consulted due to suspected seizure activity. Continued workup revealed that the patient's posturing was not likely due to seizure activity. Cardiology was consulted for the patient's cardiomyopathy one echocardiogram revealed that his EF was 25-30 percent. Lexiscan was performed which showed akinesis in these areas, overlying generalized severe hypokinesis of the ventricle. LVEF is 29% , but no reversible ischemia.  Pt will need out pt cardiology followup for repeat echocardiogram in 2 weeks.   Consultants: Cardiology CCM  Discharge Exam: Filed Vitals:   04/25/15 0846  BP: 120/73  Pulse: 78  Temp:   Resp:    Filed Vitals:   04/24/15 1754 04/24/15 2110 04/25/15 0527 04/25/15 0846  BP: 131/70 121/65 102/63 120/73  Pulse: 83 77 81 78  Temp:  98.8 F (37.1 C) 98.4 F (36.9 C)   TempSrc:  Oral Oral   Resp:  18 18   Height:      Weight:   87.1 kg (192 lb 0.3 oz)   SpO2:  99% 100%    General: A&O x 3, NAD, pleasant, cooperative Cardiovascular: RRR, no rub, no gallop, no S3 Respiratory: CTAB, no wheeze, no rhonchi Abdomen:soft, nontender, nondistended, positive bowel sounds Extremities: No edema, No lymphangitis, no petechiae  Discharge Instructions      Discharge Instructions    Diet - low sodium heart healthy    Complete by:  As directed      Increase activity slowly    Complete by:  As directed             Medication List    STOP taking these medications        enoxaparin 40 MG/0.4ML injection  Commonly known as:  LOVENOX     insulin lispro 100 UNIT/ML injection  Commonly known as:  HUMALOG      insulin pump Soln     methocarbamol 500 MG tablet  Commonly known as:  ROBAXIN     primidone 50 MG tablet  Commonly known as:  MYSOLINE     propranolol 80 MG tablet  Commonly known as:  INDERAL     traZODone 50 MG tablet  Commonly known as:  DESYREL      TAKE these medications        amoxicillin-clavulanate 875-125 MG per tablet  Commonly known as:  AUGMENTIN  Take 1 tablet by mouth every 12 (twelve) hours.     CALCIUM-VITAMIN D PO  Take 1 tablet by mouth daily at 12 noon.     carvedilol 3.125 MG tablet  Commonly known as:  COREG  Take 1 tablet (3.125 mg total) by mouth 2 (two) times daily with a meal.     clonazePAM 0.5 MG tablet  Commonly known as:  KLONOPIN  One tab po bid prn for anxiety, 2 tabs prn at night for insomnia     denosumab 60 MG/ML Soln injection  Commonly known as:  PROLIA  Inject 60 mg into the skin every 6 (six) months. Administer in upper arm, thigh, or abdomen     escitalopram 20 MG tablet  Commonly known as:  LEXAPRO  TAKE 1 TABLET BY MOUTH ONCE DAILY     fluticasone 50 MCG/ACT nasal spray  Commonly known as:  FLONASE  Place 1 spray into both nostrils daily as needed for allergies or rhinitis.  HYDROcodone-acetaminophen 5-325 MG per tablet  Commonly known as:  NORCO/VICODIN  Take 1 tablet by mouth every 8 (eight) hours.     insulin aspart 100 UNIT/ML injection  Commonly known as:  novoLOG  Inject 3 Units into the skin 3 (three) times daily with meals.     insulin glargine 100 UNIT/ML injection  Commonly known as:  LANTUS  Inject 0.25 mLs (25 Units total) into the skin at bedtime.     lisinopril 5 MG tablet  Commonly known as:  PRINIVIL,ZESTRIL  Take 1 tablet (5 mg total) by mouth 2 (two) times daily.     NEXIUM 24HR PO  Take 1 tablet by mouth daily at 12 noon.     ONE-A-DAY MENS 50+ ADVANTAGE PO  Take 1 tablet by mouth daily at 12 noon.     polyethylene glycol powder powder  Commonly known as:  GLYCOLAX/MIRALAX  Take 17 g by  mouth at bedtime.     rosuvastatin 10 MG tablet  Commonly known as:  CRESTOR  Take 10 mg by mouth every morning.     tamsulosin 0.4 MG Caps capsule  Commonly known as:  FLOMAX  TAKE 1 CAPSULE BY MOUTH ONCE DAILY         The results of significant diagnostics from this hospitalization (including imaging, microbiology, ancillary and laboratory) are listed below for reference.    Significant Diagnostic Studies: Ct Head Wo Contrast  04/19/2015   CLINICAL DATA:  Found down and unresponsive.  EXAM: CT HEAD WITHOUT CONTRAST  CT CERVICAL SPINE WITHOUT CONTRAST  TECHNIQUE: Multidetector CT imaging of the head and cervical spine was performed following the standard protocol without intravenous contrast. Multiplanar CT image reconstructions of the cervical spine were also generated.  COMPARISON:  None.  FINDINGS: CT HEAD FINDINGS  Skull and Sinuses:Negative for fracture or destructive process. The mastoids, middle ears, and imaged paranasal sinuses are clear.  Orbits: Bilateral cataract resection.  Brain: No evidence of acute infarction, hemorrhage, hydrocephalus, or mass lesion/mass effect. Generalized cerebral volume loss.  CT CERVICAL SPINE FINDINGS  Negative for acute fracture or subluxation. No prevertebral edema. No gross cervical canal hematoma. Multilevel degenerative disc narrowing and facet arthropathy. No significant osseous canal or foraminal stenosis. No endplate erosion or focal bone lesion.  Interlobular septal thickening at the apices.  IMPRESSION: 1. No acute intracranial or cervical spine findings. 2. Pulmonary edema.   Electronically Signed   By: Monte Fantasia M.D.   On: 04/19/2015 01:22   Ct Cervical Spine Wo Contrast  04/19/2015   CLINICAL DATA:  Found down and unresponsive.  EXAM: CT HEAD WITHOUT CONTRAST  CT CERVICAL SPINE WITHOUT CONTRAST  TECHNIQUE: Multidetector CT imaging of the head and cervical spine was performed following the standard protocol without intravenous contrast.  Multiplanar CT image reconstructions of the cervical spine were also generated.  COMPARISON:  None.  FINDINGS: CT HEAD FINDINGS  Skull and Sinuses:Negative for fracture or destructive process. The mastoids, middle ears, and imaged paranasal sinuses are clear.  Orbits: Bilateral cataract resection.  Brain: No evidence of acute infarction, hemorrhage, hydrocephalus, or mass lesion/mass effect. Generalized cerebral volume loss.  CT CERVICAL SPINE FINDINGS  Negative for acute fracture or subluxation. No prevertebral edema. No gross cervical canal hematoma. Multilevel degenerative disc narrowing and facet arthropathy. No significant osseous canal or foraminal stenosis. No endplate erosion or focal bone lesion.  Interlobular septal thickening at the apices.  IMPRESSION: 1. No acute intracranial or cervical spine findings. 2. Pulmonary edema.  Electronically Signed   By: Monte Fantasia M.D.   On: 04/19/2015 01:22   Mr Brain Wo Contrast  04/19/2015   CLINICAL DATA:  Altered mental status.  Unresponsive.  EXAM: MRI HEAD WITHOUT CONTRAST  TECHNIQUE: Multiplanar, multiecho pulse sequences of the brain and surrounding structures were obtained without intravenous contrast.  COMPARISON:  CT head 04/19/2015  FINDINGS: Mild to moderate atrophy. Mild ventricular enlargement consistent with the level of atrophy.  Negative for acute infarct. Minimal chronic microvascular ischemic change in the white matter.  Pituitary normal in size. Bilateral lens replacement. No orbital mass. Paranasal sinuses clear.  Negative for intracranial hemorrhage or fluid collection.  Negative for mass or edema.  No shift of the midline structures.  IMPRESSION: Generalized atrophy.  Minimal chronic microvascular ischemia.  No acute abnormality.   Electronically Signed   By: Franchot Gallo M.D.   On: 04/19/2015 12:37   US Abdomen Complete  03/29/2015   CLINICAL DATA:  Generalized abdominal pain  EXAM: ULTRASOUND ABDOMEN COMPLETE  COMPARISON:  CT  abdomen and pelvis Mar 10, 2015  FINDINGS: Gallbladder: No gallstones or wall thickening visualized. There is no pericholecystic fluid. No sonographic Murphy sign noted.  Common bile duct: Diameter: 5 mm. There is no intrahepatic, common hepatic, or common bile duct dilatation.  Liver: No focal lesion identified. Within normal limits in parenchymal echogenicity. A tiny cyst near the dome of the liver seen on CT is not appreciable on this ultrasound examination.  IVC: No abnormality visualized.  Pancreas: No mass or inflammatory focus.  Spleen: Size and appearance within normal limits.  Right Kidney: Length: 10.7 cm. Echogenicity within normal limits. No mass or hydronephrosis visualized.  Left Kidney: Length: 10.8 cm. Echogenicity within normal limits. No mass or hydronephrosis visualized.  Abdominal aorta: No aneurysm visualized.  Other findings: No demonstrable ascites.  IMPRESSION: Study within normal limits.   Electronically Signed   By: Lowella Grip III M.D.   On: 03/29/2015 08:43   Nm Myocar Multi W/spect W/wall Motion / Ef  04/24/2015    There was no ST segment deviation noted during stress.  Findings consistent with prior myocardial infarction.  This is a high risk study.  The left ventricular ejection fraction is severely decreased (<30%).   Fixed moderate size and intensity, mid to distal anterior, apical and  inferior wall defects consistent with scar. There is associated akinesis  in these areas, overlying generalized severe hypokinesis of the ventricle.  LVEF is 29%. This could suggest multivessel CAD. No significant reversible  ischemia was noted. This is a high risk study.  Pixie Casino, MD, [facility] Board Certified in Nuclear Cardiology Attending Cardiologist [place]    [place] 1 View  04/21/2015   CLINICAL DATA:  Pneumonia, extubated  EXAM: PORTABLE CHEST - 1 VIEW  COMPARISON:  04/20/2015  FINDINGS: Stable mild cardiomegaly and minimal interstitial edema pattern throughout  the lungs. Improving bibasilar atelectasis. No effusion or pneumothorax. Trachea midline. No acute osseous finding.  IMPRESSION: Cardiomegaly with minimal interstitial edema.  Improving bibasilar atelectasis   Electronically Signed   By: Jerilynn Mages.  Shick M.D.   On: 04/21/2015 07:54   Dg Chest Port 1 View  04/20/2015   CLINICAL DATA:  Acute respiratory failure.  EXAM: PORTABLE CHEST - 1 VIEW  COMPARISON:  04/19/2015.  FINDINGS: Endotracheal tube and NG tube in good anatomic position. Cardiomegaly with interim significant clearing of pulmonary infiltrates consistent with clearing pulmonary edema. Mild to moderate moderate residual. No pleural effusion  or pneumothorax. Left costophrenic angle not imaged.  IMPRESSION: 1. Lines and tubes in stable position. 2. Partial clearing of congestive heart failure and pulmonary edema.   Electronically Signed   By: Marcello Moores  Register   On: 04/20/2015 08:03   Dg Chest Port 1 View  04/19/2015   CLINICAL DATA:  Endotracheal tube placement  EXAM: PORTABLE CHEST - 1 VIEW  COMPARISON:  03/13/1999  FINDINGS: Endotracheal tube with tip between the clavicular heads and carina. The orogastric tube reaches the stomach at least.  There is interlobular septal thickening and Kerley lines consistent with pulmonary edema, also seen on the contemporaneous cervical spine CT. Opacity is asymmetric to the right. Normal heart size and mediastinal contours.  IMPRESSION: 1. Endotracheal and orogastric tubes are in good position. 2. Pulmonary edema. 3. Asymmetric opacification of the right chest could be from asymmetric edema or superimposed pneumonia/aspiration.   Electronically Signed   By: Monte Fantasia M.D.   On: 04/19/2015 01:59     Microbiology: Recent Results (from the past 240 hour(s))  MRSA PCR Screening     Status: None   Collection Time: 04/19/15  3:38 AM  Result Value Ref Range Status   MRSA by PCR NEGATIVE NEGATIVE Final    Comment:        The GeneXpert MRSA Assay (FDA approved for  NASAL specimens only), is one component of a comprehensive MRSA colonization surveillance program. It is not intended to diagnose MRSA infection nor to guide or monitor treatment for MRSA infections.   Clostridium Difficile by PCR (not at [facility])     Status: None   Collection Time: 04/24/15  8:35 PM  Result Value Ref Range Status   C difficile by pcr NEGATIVE NEGATIVE Final     Labs: Basic Metabolic Panel:  Recent Labs Lab 04/19/15 0430  04/21/15 0211 04/22/15 0444 04/22/15 1150 04/23/15 0437 04/24/15 0455 04/25/15 0628  NA 137  < > 139 139  --  138 137 140  K 5.1  < > 3.3* 3.2*  --  3.9 3.5 3.7  CL 104  < > 108 107  --  105 105 107  CO2 21*  < > 23 25  --  23 22 23   GLUCOSE 183*  < > 176* 157*  --  292* 195* 74  BUN 20  < > 8 9  --  8 11 10   CREATININE 0.85  < > 0.77 0.63  --  0.85 0.64 0.57*  CALCIUM 8.3*  < > 7.6* 7.8*  --  7.9* 7.7* 8.2*  MG 2.2  --   --   --  1.9  --   --   --   PHOS 2.4*  --   --   --   --   --   --   --   < > = values in this interval not displayed. Liver Function Tests:  Recent Labs Lab 04/19/15 0051 04/21/15 0211  AST 50* 61*  ALT 17 19  ALKPHOS 71 46  BILITOT 0.4 0.5  PROT 6.7 5.4*  ALBUMIN 3.2* 2.7*   No results for input(s): LIPASE, AMYLASE in the last 168 hours.  Recent Labs Lab 04/20/15 1018 04/22/15 1150  AMMONIA 94* 33   CBC:  Recent Labs Lab 04/19/15 0051 04/19/15 0059 04/19/15 0430 04/20/15 0209 04/21/15 0211 04/24/15 0455  WBC 11.8*  --  11.2* 7.9 5.8 4.6  NEUTROABS 10.2*  --   --   --  4.1  --   HGB  11.9* 13.6 11.7* 10.0* 9.1* 10.1*  HCT 37.1* 40.0 36.0* 31.6* 28.3* 31.2*  MCV 87.5  --  87.0 87.3 86.8 87.9  PLT 307  --  260 179 155 159   Cardiac Enzymes:  Recent Labs Lab 04/19/15 0130 04/20/15 0209 04/20/15 1306  CKTOTAL 490* 378  --   TROPONINI  --   --  1.01*   BNP: Invalid input(s): POCBNP CBG:  Recent Labs Lab 04/24/15 0807 04/24/15 1159 04/24/15 1733 04/24/15 2153 04/25/15 0806    GLUCAP 228* 206* 158* 115* 90    Time coordinating discharge:  Greater than 30 minutes  Signed:  Carmita Boom, DO [facility] Pager: [phone] 04/25/2015, 10:46 AM

## 2015-04-25 NOTE — Telephone Encounter (Signed)
New message    TCM -14 days per Melina Copa    appt on  7.29.2016 @ 12:10

## 2015-04-25 NOTE — Clinical Social Work Placement (Signed)
   CLINICAL SOCIAL WORK PLACEMENT  NOTE  Date:  04/25/2015  Patient Details  Name: Dale Morrow MRN: [id number] Date of Birth: Jan 23, 1941  Clinical Social Work is seeking post-discharge placement for this patient at the [place] level of care (*CSW will initial, date and re-position this form in  chart as items are completed):  Yes   Patient/family provided with [place] Work Department's list of facilities offering this level of care within the geographic area requested by the patient (or if unable, by the patient's family).  Yes   Patient/family informed of their freedom to choose among providers that offer the needed level of care, that participate in Medicare, Medicaid or managed care program needed by the patient, have an available bed and are willing to accept the patient.  Yes   Patient/family informed of [place]'s ownership interest in [facility] and [facility], as well as of the fact that they are under no obligation to receive care at these facilities.  PASRR submitted to EDS on 04/24/15     PASRR number received on 04/24/15     Existing PASRR number confirmed on       FL2 transmitted to all facilities in geographic area requested by pt/family on 04/24/15     FL2 transmitted to all facilities within larger geographic area on       Patient informed that his/her managed care company has contracts with or will negotiate with certain facilities, including the following:        Yes   Patient/family informed of bed offers received.  Patient chooses bed at [facility]     Physician recommends and patient chooses bed at      Patient to be transferred to [facility] on 04/25/15.  Patient to be transferred to facility by Ambulance     Patient family notified on 04/25/15 of transfer.  Name of family member notified:  Dale Morrow     PHYSICIAN       Additional Comment:  Per MD patient ready for DC to [facility]. RN,  patient, patient's family, and facility notified of DC. RN given number for report. DC packet on chart. Ambulance transport requested for patient for 1:45PM. CSW signing off.   _______________________________________________ Liz Beach MSW, [place], [place], [phone]

## 2015-04-25 NOTE — Consult Note (Signed)
   [facility] CM Inpatient Consult   04/25/2015  Dale Morrow 10-Aug-1941 [id number]   Patient active with [facility] Care Management for DM management for [employer] employees/dependents with [facility] insurance. Spoke with patient to make aware that the [facility] to [place] will follow up. Noted discharge plan is for [facility]. Will also update [facility].  Marthenia Rolling, MSN-Ed, RN,BSN [facility] Liaison [phone]

## 2015-04-25 NOTE — Telephone Encounter (Signed)
TCM pt.  Pt discharged from the hospital today 04/25/15.  Triage to call pt tomorrow for TCM follow-up.

## 2015-04-26 ENCOUNTER — Ambulatory Visit: Payer: 59 | Admitting: Physical Therapy

## 2015-04-26 ENCOUNTER — Encounter (HOSPITAL_COMMUNITY): Payer: Self-pay | Admitting: Interventional Cardiology

## 2015-04-26 ENCOUNTER — Encounter (HOSPITAL_COMMUNITY)
Admission: EM | Disposition: A | Discharge status: Skilled Nursing Facility | Payer: Self-pay | Source: Home / Self Care | Attending: Internal Medicine

## 2015-04-26 DIAGNOSIS — G40301 Generalized idiopathic epilepsy and epileptic syndromes, not intractable, with status epilepticus: Secondary | ICD-10-CM

## 2015-04-26 DIAGNOSIS — D649 Anemia, unspecified: Secondary | ICD-10-CM

## 2015-04-26 DIAGNOSIS — I2510 Atherosclerotic heart disease of native coronary artery without angina pectoris: Secondary | ICD-10-CM

## 2015-04-26 HISTORY — PX: CARDIAC CATHETERIZATION: SHX172

## 2015-04-26 LAB — CBC
HCT: 30 % — ABNORMAL LOW (ref 39.0–52.0)
Hemoglobin: 9.7 g/dL — ABNORMAL LOW (ref 13.0–17.0)
MCH: 28.1 pg (ref 26.0–34.0)
MCHC: 32.3 g/dL (ref 30.0–36.0)
MCV: 87 fL (ref 78.0–100.0)
PLATELETS: 150 10*3/uL (ref 150–400)
RBC: 3.45 MIL/uL — ABNORMAL LOW (ref 4.22–5.81)
RDW: 16.9 % — ABNORMAL HIGH (ref 11.5–15.5)
WBC: 3.8 10*3/uL — ABNORMAL LOW (ref 4.0–10.5)

## 2015-04-26 LAB — LIPID PANEL
CHOLESTEROL: 138 mg/dL (ref 0–200)
HDL: 33 mg/dL — ABNORMAL LOW (ref >40)
LDL Cholesterol: 93 mg/dL (ref 0–99)
TRIGLYCERIDES: 61 mg/dL (ref <150)
Total CHOL/HDL Ratio: 4.2 RATIO
VLDL: 12 mg/dL (ref 0–40)

## 2015-04-26 LAB — GLUCOSE, CAPILLARY
GLUCOSE-CAPILLARY: 76 mg/dL (ref 65–99)
Glucose-Capillary: 92 mg/dL (ref 65–99)

## 2015-04-26 LAB — CREATININE, SERUM
Creatinine, Ser: 0.65 mg/dL (ref 0.61–1.24)
GFR calc non Af Amer: >60 mL/min (ref >60)

## 2015-04-26 LAB — POCT ACTIVATED CLOTTING TIME: Activated Clotting Time: 239 seconds

## 2015-04-26 SURGERY — LEFT HEART CATH AND CORONARY ANGIOGRAPHY
Anesthesia: LOCAL

## 2015-04-26 MED ORDER — SODIUM CHLORIDE 0.9 % IV SOLN: INTRAVENOUS | Status: AC

## 2015-04-26 MED ORDER — MORPHINE SULFATE 2 MG/ML IJ SOLN
2.0000 mg | Freq: Once | INTRAMUSCULAR | Status: AC
  Administered 2015-04-26: 2 mg via INTRAVENOUS
  Filled 2015-04-26: qty 1

## 2015-04-26 MED ORDER — HYDROCODONE-ACETAMINOPHEN 5-325 MG PO TABS: 1.0000 | ORAL_TABLET | Freq: Three times a day (TID) | ORAL | Status: DC

## 2015-04-26 MED ORDER — FENTANYL CITRATE (PF) 100 MCG/2ML IJ SOLN
INTRAMUSCULAR | Status: DC | PRN
Start: 2015-04-26 — End: 2015-04-26
  Administered 2015-04-26: 25 ug via INTRAVENOUS

## 2015-04-26 MED ORDER — LIDOCAINE HCL (PF) 1 % IJ SOLN
INTRAMUSCULAR | Status: AC
  Filled 2015-04-26: qty 30

## 2015-04-26 MED ORDER — VERAPAMIL HCL 2.5 MG/ML IV SOLN
INTRAVENOUS | Status: AC
  Filled 2015-04-26: qty 2

## 2015-04-26 MED ORDER — TRAZODONE HCL 50 MG PO TABS: 50.0000 mg | ORAL_TABLET | Freq: Every day | ORAL | Status: DC

## 2015-04-26 MED ORDER — IOHEXOL 350 MG/ML SOLN
INTRAVENOUS | Status: DC | PRN
  Administered 2015-04-26: 85 mL via INTRAVENOUS

## 2015-04-26 MED ORDER — SODIUM CHLORIDE 0.9 % IV SOLN: 250.0000 mL | INTRAVENOUS | Status: DC | PRN

## 2015-04-26 MED ORDER — HEPARIN SODIUM (PORCINE) 1000 UNIT/ML IJ SOLN
INTRAMUSCULAR | Status: AC
  Filled 2015-04-26: qty 1

## 2015-04-26 MED ORDER — PROPRANOLOL HCL 80 MG PO TABS: 80.0000 mg | ORAL_TABLET | Freq: Two times a day (BID) | ORAL | Status: DC

## 2015-04-26 MED ORDER — VERAPAMIL HCL 2.5 MG/ML IV SOLN
INTRAVENOUS | Status: DC | PRN
  Administered 2015-04-26: 10:00:00 via INTRA_ARTERIAL

## 2015-04-26 MED ORDER — MIDAZOLAM HCL 2 MG/2ML IJ SOLN
INTRAMUSCULAR | Status: AC
Start: 2015-04-26 — End: 2015-04-26
  Filled 2015-04-26: qty 2

## 2015-04-26 MED ORDER — METHOCARBAMOL 500 MG PO TABS: 500.0000 mg | ORAL_TABLET | Freq: Four times a day (QID) | ORAL | Status: DC | PRN

## 2015-04-26 MED ORDER — FLUTICASONE PROPIONATE 50 MCG/ACT NA SUSP: 1.0000 | Freq: Every day | NASAL | Status: DC | PRN

## 2015-04-26 MED ORDER — ATORVASTATIN CALCIUM 40 MG PO TABS: 40.0000 mg | ORAL_TABLET | Freq: Every day | ORAL | Status: DC

## 2015-04-26 MED ORDER — NITROGLYCERIN 1 MG/10 ML FOR IR/CATH LAB
INTRA_ARTERIAL | Status: AC
  Filled 2015-04-26: qty 10

## 2015-04-26 MED ORDER — ONDANSETRON HCL 4 MG/2ML IJ SOLN: 4.0000 mg | Freq: Four times a day (QID) | INTRAMUSCULAR | Status: DC | PRN

## 2015-04-26 MED ORDER — NITROGLYCERIN 1 MG/10 ML FOR IR/CATH LAB
INTRA_ARTERIAL | Status: DC | PRN
  Administered 2015-04-26: 10:00:00

## 2015-04-26 MED ORDER — ACETAMINOPHEN 325 MG PO TABS: 650.0000 mg | ORAL_TABLET | ORAL | Status: DC | PRN

## 2015-04-26 MED ORDER — HEPARIN SODIUM (PORCINE) 5000 UNIT/ML IJ SOLN: 5000.0000 [IU] | Freq: Three times a day (TID) | INTRAMUSCULAR | Status: DC

## 2015-04-26 MED ORDER — SODIUM CHLORIDE 0.9 % IJ SOLN: 3.0000 mL | INTRAMUSCULAR | Status: DC | PRN

## 2015-04-26 MED ORDER — HEPARIN SODIUM (PORCINE) 1000 UNIT/ML IJ SOLN
INTRAMUSCULAR | Status: DC | PRN
  Administered 2015-04-26: 3000 [IU] via INTRAVENOUS
  Administered 2015-04-26 (×2): 4000 [IU] via INTRAVENOUS

## 2015-04-26 MED ORDER — MIDAZOLAM HCL 2 MG/2ML IJ SOLN
INTRAMUSCULAR | Status: DC | PRN
  Administered 2015-04-26: 1 mg via INTRAVENOUS

## 2015-04-26 MED ORDER — ASPIRIN EC 325 MG PO TBEC: 325.0000 mg | DELAYED_RELEASE_TABLET | Freq: Every day | ORAL | Status: DC

## 2015-04-26 MED ORDER — SODIUM CHLORIDE 0.9 % IJ SOLN: 3.0000 mL | Freq: Two times a day (BID) | INTRAMUSCULAR | Status: DC

## 2015-04-26 MED ORDER — FENTANYL CITRATE (PF) 100 MCG/2ML IJ SOLN
INTRAMUSCULAR | Status: AC
  Filled 2015-04-26: qty 2

## 2015-04-26 MED ORDER — PRIMIDONE 50 MG PO TABS
100.0000 mg | ORAL_TABLET | Freq: Three times a day (TID) | ORAL | Status: DC
  Filled 2015-04-26 (×3): qty 2

## 2015-04-26 MED ORDER — HEPARIN (PORCINE) IN NACL 2-0.9 UNIT/ML-% IJ SOLN
INTRAMUSCULAR | Status: AC
  Filled 2015-04-26: qty 1500

## 2015-04-26 MED ORDER — ASPIRIN 325 MG PO TBEC
325.0000 mg | DELAYED_RELEASE_TABLET | Freq: Every day | ORAL | Status: DC
Start: 2015-04-27 — End: 2015-05-03

## 2015-04-26 SURGICAL SUPPLY — 16 items
CATH INFINITI 5 FR JL3.5 (CATHETERS) ×3 IMPLANT
CATH INFINITI 5FR ANG PIGTAIL (CATHETERS) ×3 IMPLANT
CATH INFINITI JR4 5F (CATHETERS) ×3 IMPLANT
CATH OPTICROSS 40MHZ (CATHETERS) ×2 IMPLANT
DEVICE RAD COMP TR BAND LRG (VASCULAR PRODUCTS) ×3 IMPLANT
GLIDESHEATH SLEND SS 6F .021 (SHEATH) ×3 IMPLANT
GUIDE CATH RUNWAY 6FR CLS3 (CATHETERS) ×2 IMPLANT
KIT HEART LEFT (KITS) ×3 IMPLANT
PACK CARDIAC CATHETERIZATION (CUSTOM PROCEDURE TRAY) ×3 IMPLANT
SLED PULL BACK IVUS (MISCELLANEOUS) ×3 IMPLANT
SYR MEDRAD MARK V 150ML (SYRINGE) ×3 IMPLANT
TRANSDUCER W/STOPCOCK (MISCELLANEOUS) ×3 IMPLANT
TUBING CIL FLEX 10 FLL-RA (TUBING) ×3 IMPLANT
VALVE GUARDIAN II ~~LOC~~ HEMO (MISCELLANEOUS) ×3 IMPLANT
WIRE ASAHI PROWATER 180CM (WIRE) ×1 IMPLANT
WIRE SAFE-T 1.5MM-J .035X260CM (WIRE) ×3 IMPLANT

## 2015-04-26 NOTE — Interval H&P Note (Signed)
Cath Lab Visit (complete for each Cath Lab visit)  Clinical Evaluation Leading to the Procedure:   ACS: Yes.    Non-ACS:    Anginal Classification: CCS IV  Anti-ischemic medical therapy: Minimal Therapy (1 class of medications)  Non-Invasive Test Results: High-risk stress test findings: cardiac mortality >3%/year  Prior CABG: No previous CABG  TIMI Score  Patient Information:  TIMI Score is 3   UA/NSTEMI and intermediate-risk features (e.g., TIMI score 3-4) for short-term risk of death or nonfatal MI  Revascularization of the presumed culprit artery   A (9)  Indication: 10; Score: 9     History and Physical Interval Note:  04/26/2015 9:43 AM  Dale Morrow  has presented today for surgery, with the diagnosis of cp  The various methods of treatment have been discussed with the patient and family. After consideration of risks, benefits and other options for treatment, the patient has consented to  Procedure(s): Left Heart Cath and Coronary Angiography (N/A) as a surgical intervention .  The patient's history has been reviewed, patient examined, no change in status, stable for surgery.  I have reviewed the patient's chart and labs.  Questions were answered to the patient's satisfaction.     VARANASI,JAYADEEP S.

## 2015-04-26 NOTE — Telephone Encounter (Signed)
Follow up      TCM appt moved to 05-05-15 per [facility]

## 2015-04-26 NOTE — Clinical Social Work Placement (Signed)
   CLINICAL SOCIAL WORK PLACEMENT  NOTE  Date:  04/26/2015  Patient Details  Name: Dale Morrow MRN: [id number] Date of Birth: July 27, 1941  Clinical Social Work is seeking post-discharge placement for this patient at the [place] level of care (*CSW will initial, date and re-position this form in  chart as items are completed):  Yes   Patient/family provided with [place] Work Department's list of facilities offering this level of care within the geographic area requested by the patient (or if unable, by the patient's family).  Yes   Patient/family informed of their freedom to choose among providers that offer the needed level of care, that participate in Medicare, Medicaid or managed care program needed by the patient, have an available bed and are willing to accept the patient.  Yes   Patient/family informed of [facility]'s ownership interest in [facility] and [facility], as well as of the fact that they are under no obligation to receive care at these facilities.  PASRR submitted to EDS on 04/24/15     PASRR number received on 04/24/15     Existing PASRR number confirmed on       FL2 transmitted to all facilities in geographic area requested by pt/family on 04/24/15     FL2 transmitted to all facilities within larger geographic area on       Patient informed that his/her managed care company has contracts with or will negotiate with certain facilities, including the following:        Yes   Patient/family informed of bed offers received.  Patient chooses bed at [facility]     Physician recommends and patient chooses bed at      Patient to be transferred to [facility] on 04/25/15. 7/12 d/c cancelled - Patient discharged 04/26/15.  Patient to be transferred to facility by Ambulance     Patient family notified on 04/25/15 of transfer. Wife [name] ( contacted on 7/13 and message left regarding d/c and transport called.  Name of  family member notified:  [name]     PHYSICIAN       Additional Comment:    _______________________________________________ Sable Feil, LCSW 04/26/2015, 2:24 PM

## 2015-04-26 NOTE — Progress Notes (Signed)
Report called to Genice Rouge at [facility], [phone]). Jefferey Pica notified by that her husband will be transferred today to [facility]. She will be called when transport is here so she can meet him at the facility on arrival. Crawford Givens SW called and notified that report was called and patient and wife aware of transfer. Requested transport be arranged.

## 2015-04-26 NOTE — H&P (View-Only) (Signed)
Reason for follow up: Cardiology is going to bring patient for left heart cath and wanted neurology to see patient prior to cath due to possible risk of stroke.    Subjective: Much more alert and bale to follow commands since last exam. No complaints.  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   Objective: Current vital signs: BP 120/73 mmHg  Pulse 78  Temp(Src) 98.4 F (36.9 C) (Oral)  Resp 18  Ht 5\' 8"  (1.727 m)  Wt 87.1 kg (192 lb 0.3 oz)  BMI 29.20 kg/m2  SpO2 100% Vital signs in last 24 hours: Temp:  [98 F (36.7 C)-98.8 F (37.1 C)] 98.4 F (36.9 C) (07/12 0527) Pulse Rate:  [77-93] 78 (07/12 0846) Resp:  [18-20] 18 (07/12 0527) BP: (102-131)/(63-73) 120/73 mmHg (07/12 0846) SpO2:  [99 %-100 %] 100 % (07/12 0527) Weight:  [87.1 kg (192 lb 0.3 oz)] 87.1 kg (192 lb 0.3 oz) (07/12 0527)  Intake/Output from previous day: 07/11 0701 - 07/12 0700 In: 320 [P.O.:120; IV Piggyback:200] Out: 450 [Urine:450] Intake/Output this shift: Total I/O In: 240 [P.O.:240] Out: -  Nutritional status: Diet Carb Modified Fluid consistency:: Thin; Room service appropriate?: Yes Diet - low sodium heart healthy  Neurologic Exam: General: Mental Status: Alert, oriented, thought content appropriate.  Speech fluent without evidence of aphasia.  Able to follow 3 step commands without difficulty. Cranial Nerves: II: Visual fields grossly normal, pupils equal, round, reactive to light and accommodation III,IV, VI: ptosis present left eye (old), extra-ocular motions intact bilaterally V,VII: smile asymmetric on the left with decreased righ NL, facial light touch sensation normal bilaterally, decreased movement of left frontalis VIII: hearing normal bilaterally IX,X: uvula rises symmetrically XI: bilateral shoulder shrug XII: midline tongue extension without atrophy or fasciculations  Motor: Right : Upper extremity   5/5    Left:     Upper extremity    5/5  Lower extremity   5/5     Lower extremity   5/5 Tone and bulk:normal tone throughout; no atrophy noted Sensory: Pinprick and light touch intact throughout, bilaterally Deep Tendon Reflexes:  Right: Upper Extremity   Left: Upper extremity   biceps (C-5 to C-6) 1/4   biceps (C-5 to C-6) 1/4 tricep (C7) 1/4    triceps (C7) 1/4 Brachioradialis (C6) 1/4  Brachioradialis (C6) 1/4  Lower Extremity Lower Extremity  quadriceps (L-2 to L-4) 0/4   quadriceps (L-2 to L-4) 0/4 Achilles (S1) 0/4   Achilles (S1) 0/4  Plantars: Right: downgoing   Left: downgoing Cerebellar: normal finger-to-nose,  normal heel-to-shin test    Lab Results: Basic Metabolic Panel:  Recent Labs Lab 04/19/15 0430  04/21/15 0211 04/22/15 0444 04/22/15 1150 04/23/15 0437 04/24/15 0455 04/25/15 0628  NA 137  < > 139 139  --  138 137 140  K 5.1  < > 3.3* 3.2*  --  3.9 3.5 3.7  CL 104  < > 108 107  --  105 105 107  CO2 21*  < > 23 25  --  23 22 23   GLUCOSE 183*  < > 176* 157*  --  292* 195* 74  BUN 20  < > 8 9  --  8 11 10   CREATININE 0.85  < > 0.77 0.63  --  0.85 0.64 0.57*  CALCIUM 8.3*  < > 7.6* 7.8*  --  7.9* 7.7* 8.2*  MG 2.2  --   --   --  1.9  --   --   --   PHOS 2.4*  --   --   --   --   --   --   --   < > = values in this interval not displayed.  Liver Function Tests:  Recent Labs Lab 04/19/15 0051 04/21/15 0211  AST 50* 61*  ALT 17 19  ALKPHOS 71 46  BILITOT 0.4 0.5  PROT 6.7 5.4*  ALBUMIN 3.2* 2.7*   No results for input(s): LIPASE, AMYLASE in the last 168 hours.  Recent Labs Lab 04/20/15 1018 04/22/15 1150  AMMONIA 94* 33    CBC:  Recent Labs Lab 04/19/15 0051 04/19/15 0059 04/19/15 0430 04/20/15 0209 04/21/15 0211 04/24/15 0455  WBC 11.8*  --  11.2* 7.9 5.8 4.6  NEUTROABS 10.2*  --   --   --  4.1  --   HGB 11.9* 13.6 11.7* 10.0* 9.1* 10.1*  HCT 37.1* 40.0 36.0* 31.6* 28.3* 31.2*  MCV 87.5  --  87.0 87.3 86.8 87.9  PLT 307  --  260 179 155 159    Cardiac  Enzymes:  Recent Labs Lab 04/19/15 0130 04/20/15 0209 04/20/15 1306  CKTOTAL 490* 378  --   TROPONINI  --   --  1.01*    Lipid Panel:  Recent Labs Lab 04/19/15 0430  TRIG 63    CBG:  Recent Labs Lab 04/24/15 1159 04/24/15 1733 04/24/15 2153 04/25/15 0806 04/25/15 1202  GLUCAP 206* 158* 115* 72 125*    Microbiology: Results for orders placed or performed during the hospital encounter of 04/19/15  MRSA PCR Screening     Status: None   Collection Time: 04/19/15  3:38 AM  Result Value Ref Range Status   MRSA by PCR NEGATIVE NEGATIVE Final    Comment:        The GeneXpert MRSA Assay (FDA approved for NASAL specimens only), is one component of a comprehensive MRSA colonization surveillance program. It is not intended to diagnose MRSA infection nor to guide or monitor treatment for MRSA infections.   Clostridium Difficile by PCR (not at [facility])     Status: None   Collection Time: 04/24/15  8:35 PM  Result Value Ref Range Status   C difficile by pcr NEGATIVE NEGATIVE Final    Coagulation Studies: No results for input(s): LABPROT, INR in the last 72 hours.  Imaging: Nm Myocar Multi W/spect W/wall Motion / Ef  04/24/2015    There was no ST segment deviation noted during stress.  Findings consistent with prior myocardial infarction.  This is a high risk study.  The left ventricular ejection fraction is severely decreased (<30%).   Fixed moderate size and intensity, mid to distal anterior, apical and  inferior wall defects consistent with scar. There is associated akinesis  in these areas, overlying generalized severe hypokinesis of the ventricle.  LVEF is 29%. This could suggest multivessel CAD. No significant reversible  ischemia was noted. This is a high risk study.  Pixie Casino, MD, [facility] Board Certified in Nuclear Cardiology Attending Cardiologist [facility]     Medications:  Scheduled: . amoxicillin-clavulanate  1 tablet Oral Q12H  . antiseptic  oral rinse  7 mL Mouth Rinse QID  . aspirin EC  325 mg Oral Daily  . carvedilol  3.125 mg Oral BID WC  . chlorhexidine  15 mL Mouth Rinse BID  . heparin  5,000 Units Subcutaneous 3 times per day  . insulin aspart  0-15 Units Subcutaneous  TID WC  . insulin aspart  0-5 Units Subcutaneous QHS  . insulin aspart  3 Units Subcutaneous TID WC  . insulin glargine  25 Units Subcutaneous QHS  . lisinopril  5 mg Oral BID  . pantoprazole  40 mg Oral Q1200    Assessment/Plan: 74 YO male with AMS secondary to hypoglycemia.  Much improved since admission. At this time no further recommendations.  Neurology will S/O   Etta Quill PA-C [facility] [phone]  04/25/2015, 2:22 PM

## 2015-04-26 NOTE — Discharge Summary (Signed)
Dale Morrow, is a 74 y.o. male  DOB 09-27-1941  MRN [id number].  Admission date:  04/19/2015  Admitting Physician  Rigoberto Noel, MD  Discharge Date:  04/26/2015   Primary MD  Jonathon Bellows, MD  Recommendations for primary care physician for things to follow:    Pt will need to follow up with PCP in 2 weeks post discharge  Please obtain BMP and CBC in one week  Augmentin 875/25 bid x one more day  Please check CBGs before each meal and at bedtime  Please check potassium and magnesium in 2-3 days   Admission Diagnosis  Status epilepticus [G40.301] Altered consciousness [R40.4]   Discharge Diagnosis  Status epilepticus [G40.301] Altered consciousness [R40.4]     Active Problems:   Status epilepticus   Altered consciousness   Acute respiratory failure   Seizure-like activity   Acute encephalopathy   Altered mental status   Elevated troponin   Aspiration pneumonia   Cardiomyopathy      Past Medical History  Diagnosis Date  . Arthritis     left knee  . Depression   . De Quervain's tenosynovitis, left 04/2012  . Tremors of nervous system     takes propranolol for tremors  . History of radiation therapy 3 years ago  . Prostate cancer, primary, with metastasis from prostate to other site     metastasis to lymph nodes  . Complication of anesthesia     pt denies ponv, pt woke up during colonscopy in past  . Diabetes mellitus     Insulin pump, takes lisinopril for kidneys, dr Buddy Duty endocrinologist  . Left-sided Bell's palsy   . Right-sided Bell's palsy     Past Surgical History  Procedure Laterality Date  . Hemorrhoid surgery  1970s  . Anterior cruciate ligament repair      right knee  . Wrist arthroscopy  05/03/2010    right; and release of 1st dorsal compartment  . Knee arthrotomy   03/18/2008    left; with scar exc.  [name] Knee arthroscopy  09/25/2007; 04/15/2005    left  . Knee joint manipulation  06/29/2007    left  . Total knee arthroplasty  04/29/2007    left  . Dorsal compartment release  04/21/2012    Procedure: RELEASE DORSAL COMPARTMENT (DEQUERVAIN);  Surgeon: Cammie Sickle., MD;  Location: [facility];  Service: Orthopedics;  Laterality: Left;  left 1st dorsal compartment release left wrist   . Minor amputation of digit Left 07/21/2013    Procedure: LEFT SMALL REVISION AMPUTATION (MINOR PROCEDURE) ;  Surgeon: Schuyler Amor, MD;  Location: [facility];  Service: Orthopedics;  Laterality: Left;  . Colonoscopy with propofol N/A 04/07/2014    Procedure: COLONOSCOPY WITH PROPOFOL;  Surgeon: Jeryl Columbia, MD;  Location: [facility];  Service: Endoscopy;  Laterality: N/A;  . Total knee revision Left 01/25/2015    Procedure: LEFT TOTAL KNEE  ARTHOPLASTY REVISION;  Surgeon: Gaynelle Arabian, MD;  Location: [facility];  Service: Orthopedics;  Laterality: Left;       HPI  from the history and physical done on the day of admission:    74 year old male with a history of diabetes mellitus, prostate cancer (s/p XRT), depression presented to the emergency department after being found unresponsive by his wife at 11:45 PM on 04/18/2015. The patient was last seen normal around 3:30 PM on 04/18/2015. He was noted to be rigid by EMS. Initial CBG was 3:30 and responded well to D50. He was agitated in the emergency department, and he was noted to be posturing.. CT of the brain was unremarkable. The patient was intubated in the emergency department. He was noted to be hypertensive with systolic blood pressure in the 200s initially. Neurology was consulted due to suspected seizure activity. Continued workup revealed that the patient's posturing was not likely due to seizure activity. Cardiology was consulted for the patient's cardiomyopathy one echocardiogram revealed  that his EF was 25-30 percent. Lexiscan was performed which showed akinesis in these areas, overlying generalized severe hypokinesis of the ventricle. LVEF is 29% , but no reversible ischemia. Pt will need out pt cardiology followup for repeat echocardiogram in 2 weeks.     Hospital Course:     Acute respiratory failure -Thought to be secondary to seizure activity and pulmonary edema - extubated on 04/20/2015 -presently stable without increased WOB -Presently stable on room air  Elevated troponin -Likely due to demand ischemia and cardiomyopathy - cardiology was consulted after Echocardiogram revealed ejection fraction of 25% - EKG without any concerning ischemic changes, seen by cardiology underwent left heart catheterization on 04/26/2015 which showed mild-to-moderate CAD. Medical management. Meds adjusted. Outpatient cardiology follow-up.  Nonischemic cardiomyopathy - appreciate cardiology consultation - Thought to be Takotsubo cardiomyopathy  -Echo 04/19/2015 EF 25-30%, akinesis of mid-apical anteroseptal, anterolateral, inferolateral, inferoseptal and apical myodium, with basal sparing. characteristics of takotsubo cardiomyoapthy -Continue lisinopril and carvedilol - plan to repeat echocardiogram in 2 weeks - Lexiscan 04/24/2015--akinesis in these areas, overlying generalized severe hypokinesis of the ventricle. LVEF is 29% , but no reversible ischemia, left heart cath with mild CAD. Full medical management.   Hematuria -Suspect Foley, as the patient was agitated early in the morning 04/23/2015 -Repeat CBC--hemoglobin stable -Discontinue Foley catheter -urine clearing--no further hematuria -able to urinate spontaneously after foley removed  Seizure Like Activity/ Acute encephalopathy -multifactorial including hypoglycemia, cardiomyopathy, lactic acidosis with hypoperfusion (and transient hyperammonemia) and aspiration pneumonia -Neurology was consulted -Initial EEG is  consistent with a generalized non-specific cerebral dysfunction(encephalopathy) without seizure predisposition - patient continued to have increase tone and rigidity - Repeat EEG 04/20/2015 did not show any epileptiform discharges during an episode of increased tone and posturing - Mental status continues to improve daily - Keppra was subsequently discontinued - MRI brain negative for acute findings -check B12--850 -TSH--0.551 -Discontinue Mysoline, trazodone, Inderal   Aspiration pneumonia - continue Unasyn D#6 - D/c with Augmentin x 1 additional day - diarrhea--neg for cdiff -stable on RA  Hyerammonemia -if remains elevated, then further investigations warranted -TDHRCBU-->38  Metabolic acidosis -Question whether this was related to the patient's seizure activity -Patient had elevated CPK -resolved   Diabetes mellitus type 1  -Patient had ketonuria initially, but did not have hyperglycemia -As the patient remains intermittently confused, will not restart insulin pump at this time -Increase Lantus to 25 units daily  -change to moderate SSI -add novolog 3 units with meals -will need outpt followup with Dr. Buddy Duty, monitor CBGs every before meals at bedtime.  CBG (last 3)   Recent Labs  04/25/15 2148 04/26/15 0805 04/26/15 1110  GLUCAP 157* 92 76     Hypokalemia - repleted - check potassium and magnesium in 2-3 days at SNF    Discharge Condition: Fair  Follow UP  Follow-up Information    Follow up with Murray Hodgkins, NP.   Specialties:  Nurse Practitioner, Cardiology, Radiology   Why:  [facility] - 05/05/15 at [place] Gerald Stabs is one of our PAs that works with Dr. Marlou Porch.)   Contact information:   [address] [address] [phone]       Follow up with Jonathon Bellows, MD. Schedule an appointment as soon as possible for a visit in 1 week.   Specialty:  Family Medicine   Contact information:   [place] [address] [phone]        Consults obtained - Cards, Neuro, PCCM  Diet and Activity recommendation: See Discharge Instructions below  Discharge Instructions       Discharge Instructions    Diet - low sodium heart healthy    Complete by:  As directed      Discharge instructions    Complete by:  As directed   Cath site care: No lifting over 5 lbs for 1 week. Keep procedure site clean & dry. If you notice increased pain, swelling, bleeding or pus, call/return!  You may shower, but no soaking baths/hot tubs/pools for 1 week.   Follow with Primary MD WEBB, CAROL D, MD in 7 days   Get CBC, CMP, 2 view Chest X ray checked  by Primary MD next visit.    Activity: As tolerated with Full fall precautions use walker/cane & assistance as needed   Disposition Home     Diet: Heart Healthy Low Carb.  Accuchecks 4 times/day, Once in AM empty stomach and then before each meal. Log in all results and show them to your Prim.MD in 3 days. If any glucose reading is under 80 or above 300 call your Prim MD immidiately. Follow Low glucose instructions for glucose under 80 as instructed.   For Heart failure patients - Check your Weight same time everyday, if you gain over 2 pounds, or you develop in leg swelling, experience more shortness of breath or chest pain, call your Primary MD immediately. Follow Cardiac Low Salt Diet and 1.5 lit/day fluid restriction.   On your next visit with your primary care physician please Get Medicines reviewed and adjusted.   Please request your Prim.MD to go over all Hospital Tests and Procedure/Radiological results at the follow up, please get all Hospital records sent to your Prim MD by signing hospital release before you go home.   If you experience worsening of your admission symptoms, develop shortness of breath, life threatening emergency, suicidal or homicidal thoughts you must seek medical attention immediately by calling 911  or calling your MD immediately  if symptoms less severe.  You Must read complete instructions/literature along with all the possible adverse reactions/side effects for all the Medicines you take and that have been prescribed to you. Take any new Medicines after you have completely understood and accpet all the possible adverse reactions/side effects.   Do not drive, operating heavy machinery, perform activities at heights, swimming or participation in water activities or provide baby sitting services if your were admitted for syncope or siezures until you have seen by Primary MD or a Neurologist and advised to do  so again.  Do not drive when taking Pain medications.    Do not take more than prescribed Pain, Sleep and Anxiety Medications  Special Instructions: If you have smoked or chewed Tobacco  in the last 2 yrs please stop smoking, stop any regular Alcohol  and or any Recreational drug use.  Wear Seat belts while driving.   Please note  You were cared for by a hospitalist during your hospital stay. If you have any questions about your discharge medications or the care you received while you were in the hospital after you are discharged, you can call the unit and asked to speak with the hospitalist on call if the hospitalist that took care of you is not available. Once you are discharged, your primary care physician will handle any further medical issues. Please note that NO REFILLS for any discharge medications will be authorized once you are discharged, as it is imperative that you return to your primary care physician (or establish a relationship with a primary care physician if you do not have one) for your aftercare needs so that they can reassess your need for medications and monitor your lab values.     Increase activity slowly    Complete by:  As directed      Increase activity slowly    Complete by:  As directed              Discharge Medications       Medication List     STOP taking these medications        enoxaparin 40 MG/0.4ML injection  Commonly known as:  LOVENOX     insulin lispro 100 UNIT/ML injection  Commonly known as:  HUMALOG     insulin pump Soln     methocarbamol 500 MG tablet  Commonly known as:  ROBAXIN     primidone 50 MG tablet  Commonly known as:  MYSOLINE     propranolol 80 MG tablet  Commonly known as:  INDERAL     traZODone 50 MG tablet  Commonly known as:  DESYREL      TAKE these medications        amoxicillin-clavulanate 875-125 MG per tablet  Commonly known as:  AUGMENTIN  Take 1 tablet by mouth every 12 (twelve) hours.     aspirin 325 MG EC tablet  Take 1 tablet (325 mg total) by mouth daily.  Start taking on:  04/27/2015     CALCIUM-VITAMIN D PO  Take 1 tablet by mouth daily at 12 noon.     carvedilol 3.125 MG tablet  Commonly known as:  COREG  Take 1 tablet (3.125 mg total) by mouth 2 (two) times daily with a meal.     clonazePAM 0.5 MG tablet  Commonly known as:  KLONOPIN  Take 1 tablet (0.5 mg total) by mouth 2 (two) times daily as needed for anxiety (sleep).     denosumab 60 MG/ML Soln injection  Commonly known as:  PROLIA  Inject 60 mg into the skin every 6 (six) months. Administer in upper arm, thigh, or abdomen     escitalopram 20 MG tablet  Commonly known as:  LEXAPRO  TAKE 1 TABLET BY MOUTH ONCE DAILY     fluticasone 50 MCG/ACT nasal spray  Commonly known as:  FLONASE  Place 1 spray into both nostrils daily as needed for allergies or rhinitis.     HYDROcodone-acetaminophen 5-325 MG per tablet  Commonly known as:  NORCO/VICODIN  Take 1 tablet  by mouth every 8 (eight) hours.     insulin aspart 100 UNIT/ML injection  Commonly known as:  novoLOG  Inject 3 Units into the skin 3 (three) times daily with meals.     insulin glargine 100 UNIT/ML injection  Commonly known as:  LANTUS  Inject 0.25 mLs (25 Units total) into the skin at bedtime.     lisinopril 5 MG tablet  Commonly known as:   PRINIVIL,ZESTRIL  Take 1 tablet (5 mg total) by mouth 2 (two) times daily.     NEXIUM 24HR PO  Take 1 tablet by mouth daily at 12 noon.     ONE-A-DAY MENS 50+ ADVANTAGE PO  Take 1 tablet by mouth daily at 12 noon.     polyethylene glycol powder powder  Commonly known as:  GLYCOLAX/MIRALAX  Take 17 g by mouth at bedtime.     rosuvastatin 10 MG tablet  Commonly known as:  CRESTOR  Take 10 mg by mouth every morning.     tamsulosin 0.4 MG Caps capsule  Commonly known as:  FLOMAX  TAKE 1 CAPSULE BY MOUTH ONCE DAILY        Major procedures and Radiology Reports - PLEASE review detailed and final reports for all details, in brief -   TTE - Left ventricle: The cavity size was normal. Systolic function was severely reduced. The estimated ejection fraction was in the range of 25% to 30%. There is akinesis of the mid-apical anteroseptal, anterolateral, inferolateral, inferoseptal, and apical myocardium. Basal sparing (? Takotsubo) No evidence of thrombus. - Pulmonary arteries: Systolic pressure was mildly increased. PA peak pressure: 40 mm Hg (S).  L heart cath   Mid LAD lesion, 50-60% eccentric, calcific stenosis. Minimal luminal area by IVUS was 4.2 mm.  Continue aggressive medical therapy for his cardiomyopathy. He is not having any anginal symptoms. He had a mild troponin elevation in the setting of respiratory arrest and severe hypoglycemia. It's not clear that he has flow-limiting coronary artery disease that caused the wall motion abnormality. Given the moderate disease is present, he may have had relatively more ischemia in this territory during his respiratory arrest.  If in the future, he developed angina, could consider evaluation of his LAD again. Would consider adding clopidogrel if safe from a neurologic and bleeding standpoint. If his left ventricular function does not improve with medical therapy, could consider viability study as well. If there is  hibernating myocardium noted in the distal anterior wall and apex, could reconsider PCI of the LAD.   Ct Head Wo Contrast  04/19/2015   CLINICAL DATA:  Found down and unresponsive.  EXAM: CT HEAD WITHOUT CONTRAST  CT CERVICAL SPINE WITHOUT CONTRAST  TECHNIQUE: Multidetector CT imaging of the head and cervical spine was performed following the standard protocol without intravenous contrast. Multiplanar CT image reconstructions of the cervical spine were also generated.  COMPARISON:  None.  FINDINGS: CT HEAD FINDINGS  Skull and Sinuses:Negative for fracture or destructive process. The mastoids, middle ears, and imaged paranasal sinuses are clear.  Orbits: Bilateral cataract resection.  Brain: No evidence of acute infarction, hemorrhage, hydrocephalus, or mass lesion/mass effect. Generalized cerebral volume loss.  CT CERVICAL SPINE FINDINGS  Negative for acute fracture or subluxation. No prevertebral edema. No gross cervical canal hematoma. Multilevel degenerative disc narrowing and facet arthropathy. No significant osseous canal or foraminal stenosis. No endplate erosion or focal bone lesion.  Interlobular septal thickening at the apices.  IMPRESSION: 1. No acute intracranial or cervical spine findings.  2. Pulmonary edema.   Electronically Signed   By: Monte Fantasia M.D.   On: 04/19/2015 01:22   Ct Cervical Spine Wo Contrast  04/19/2015   CLINICAL DATA:  Found down and unresponsive.  EXAM: CT HEAD WITHOUT CONTRAST  CT CERVICAL SPINE WITHOUT CONTRAST  TECHNIQUE: Multidetector CT imaging of the head and cervical spine was performed following the standard protocol without intravenous contrast. Multiplanar CT image reconstructions of the cervical spine were also generated.  COMPARISON:  None.  FINDINGS: CT HEAD FINDINGS  Skull and Sinuses:Negative for fracture or destructive process. The mastoids, middle ears, and imaged paranasal sinuses are clear.  Orbits: Bilateral cataract resection.  Brain: No evidence of  acute infarction, hemorrhage, hydrocephalus, or mass lesion/mass effect. Generalized cerebral volume loss.  CT CERVICAL SPINE FINDINGS  Negative for acute fracture or subluxation. No prevertebral edema. No gross cervical canal hematoma. Multilevel degenerative disc narrowing and facet arthropathy. No significant osseous canal or foraminal stenosis. No endplate erosion or focal bone lesion.  Interlobular septal thickening at the apices.  IMPRESSION: 1. No acute intracranial or cervical spine findings. 2. Pulmonary edema.   Electronically Signed   By: Monte Fantasia M.D.   On: 04/19/2015 01:22   Mr Brain Wo Contrast  04/19/2015   CLINICAL DATA:  Altered mental status.  Unresponsive.  EXAM: MRI HEAD WITHOUT CONTRAST  TECHNIQUE: Multiplanar, multiecho pulse sequences of the brain and surrounding structures were obtained without intravenous contrast.  COMPARISON:  CT head 04/19/2015  FINDINGS: Mild to moderate atrophy. Mild ventricular enlargement consistent with the level of atrophy.  Negative for acute infarct. Minimal chronic microvascular ischemic change in the white matter.  Pituitary normal in size. Bilateral lens replacement. No orbital mass. Paranasal sinuses clear.  Negative for intracranial hemorrhage or fluid collection.  Negative for mass or edema.  No shift of the midline structures.  IMPRESSION: Generalized atrophy.  Minimal chronic microvascular ischemia.  No acute abnormality.   Electronically Signed   By: Franchot Gallo M.D.   On: 04/19/2015 12:37   US Abdomen Complete  03/29/2015   CLINICAL DATA:  Generalized abdominal pain  EXAM: ULTRASOUND ABDOMEN COMPLETE  COMPARISON:  CT abdomen and pelvis Mar 10, 2015  FINDINGS: Gallbladder: No gallstones or wall thickening visualized. There is no pericholecystic fluid. No sonographic Murphy sign noted.  Common bile duct: Diameter: 5 mm. There is no intrahepatic, common hepatic, or common bile duct dilatation.  Liver: No focal lesion identified. Within normal  limits in parenchymal echogenicity. A tiny cyst near the dome of the liver seen on CT is not appreciable on this ultrasound examination.  IVC: No abnormality visualized.  Pancreas: No mass or inflammatory focus.  Spleen: Size and appearance within normal limits.  Right Kidney: Length: 10.7 cm. Echogenicity within normal limits. No mass or hydronephrosis visualized.  Left Kidney: Length: 10.8 cm. Echogenicity within normal limits. No mass or hydronephrosis visualized.  Abdominal aorta: No aneurysm visualized.  Other findings: No demonstrable ascites.  IMPRESSION: Study within normal limits.   Electronically Signed   By: Lowella Grip III M.D.   On: 03/29/2015 08:43   Nm Myocar Multi W/spect W/wall Motion / Ef  04/24/2015    There was no ST segment deviation noted during stress.  Findings consistent with prior myocardial infarction.  This is a high risk study.  The left ventricular ejection fraction is severely decreased (<30%).   Fixed moderate size and intensity, mid to distal anterior, apical and  inferior wall defects consistent with scar.  There is associated akinesis  in these areas, overlying generalized severe hypokinesis of the ventricle.  LVEF is 29%. This could suggest multivessel CAD. No significant reversible  ischemia was noted. This is a high risk study.  Pixie Casino, MD, [facility] Board Certified in Nuclear Cardiology Attending Cardiologist [place]    Ethel 1 View  04/21/2015   CLINICAL DATA:  Pneumonia, extubated  EXAM: PORTABLE CHEST - 1 VIEW  COMPARISON:  04/20/2015  FINDINGS: Stable mild cardiomegaly and minimal interstitial edema pattern throughout the lungs. Improving bibasilar atelectasis. No effusion or pneumothorax. Trachea midline. No acute osseous finding.  IMPRESSION: Cardiomegaly with minimal interstitial edema.  Improving bibasilar atelectasis   Electronically Signed   By: Jerilynn Mages.  Shick M.D.   On: 04/21/2015 07:54   Dg Chest Port 1 View  04/20/2015   CLINICAL  DATA:  Acute respiratory failure.  EXAM: PORTABLE CHEST - 1 VIEW  COMPARISON:  04/19/2015.  FINDINGS: Endotracheal tube and NG tube in good anatomic position. Cardiomegaly with interim significant clearing of pulmonary infiltrates consistent with clearing pulmonary edema. Mild to moderate moderate residual. No pleural effusion or pneumothorax. Left costophrenic angle not imaged.  IMPRESSION: 1. Lines and tubes in stable position. 2. Partial clearing of congestive heart failure and pulmonary edema.   Electronically Signed   By: Marcello Moores  Register   On: 04/20/2015 08:03   Dg Chest Port 1 View  04/19/2015   CLINICAL DATA:  Endotracheal tube placement  EXAM: PORTABLE CHEST - 1 VIEW  COMPARISON:  03/13/1999  FINDINGS: Endotracheal tube with tip between the clavicular heads and carina. The orogastric tube reaches the stomach at least.  There is interlobular septal thickening and Kerley lines consistent with pulmonary edema, also seen on the contemporaneous cervical spine CT. Opacity is asymmetric to the right. Normal heart size and mediastinal contours.  IMPRESSION: 1. Endotracheal and orogastric tubes are in good position. 2. Pulmonary edema. 3. Asymmetric opacification of the right chest could be from asymmetric edema or superimposed pneumonia/aspiration.   Electronically Signed   By: Monte Fantasia M.D.   On: 04/19/2015 01:59    Micro Results      Recent Results (from the past 240 hour(s))  MRSA PCR Screening     Status: None   Collection Time: 04/19/15  3:38 AM  Result Value Ref Range Status   MRSA by PCR NEGATIVE NEGATIVE Final    Comment:        The GeneXpert MRSA Assay (FDA approved for NASAL specimens only), is one component of a comprehensive MRSA colonization surveillance program. It is not intended to diagnose MRSA infection nor to guide or monitor treatment for MRSA infections.   Clostridium Difficile by PCR (not at [facility])     Status: None   Collection Time: 04/24/15  8:35 PM  Result  Value Ref Range Status   C difficile by pcr NEGATIVE NEGATIVE Final       Today   Subjective    Darryl Nestle today has no headache,no chest abdominal pain,no new weakness tingling or numbness, feels much better wants to go home today.    Objective   Blood pressure 140/73, pulse 71, temperature 97.8 F (36.6 C), temperature source Oral, resp. rate 11, height 5\' 8"  (1.727 m), weight 79.652 kg (175 lb 9.6 oz), SpO2 98 %.   Intake/Output Summary (Last 24 hours) at 04/26/15 1342 Last data filed at 04/26/15 0900  Gross per 24 hour  Intake    318 ml  Output  0 ml  Net    318 ml    Exam Awake Alert, Oriented x 3, No new F.N deficits, Normal affect, and neck facial asymmetry due to Bell's palsy [place].AT,PERRAL Supple Neck,No JVD, No cervical lymphadenopathy appriciated.  Symmetrical Chest wall movement, Good air movement bilaterally, CTAB RRR,No Gallops,Rubs or new Murmurs, No Parasternal Heave +ve B.Sounds, Abd Soft, Non tender, No organomegaly appriciated, No rebound -guarding or rigidity. No Cyanosis, Clubbing or edema, No new Rash or bruise   Data Review   CBC w Diff: Lab Results  Component Value Date   WBC 3.8* 04/26/2015   HGB 9.7* 04/26/2015   HCT 30.0* 04/26/2015   PLT 150 04/26/2015   LYMPHOPCT 17 04/21/2015   MONOPCT 10 04/21/2015   EOSPCT 2 04/21/2015   BASOPCT 0 04/21/2015    CMP: Lab Results  Component Value Date   NA 140 04/25/2015   K 3.7 04/25/2015   CL 107 04/25/2015   CO2 23 04/25/2015   BUN 10 04/25/2015   CREATININE 0.65 04/26/2015   PROT 5.4* 04/21/2015   ALBUMIN 2.7* 04/21/2015   BILITOT 0.5 04/21/2015   ALKPHOS 46 04/21/2015   AST 61* 04/21/2015   ALT 19 04/21/2015  .   Total Time in preparing paper work, data evaluation and todays exam - 35 minutes  Thurnell Lose M.D on 04/26/2015 at [facility]   Office  [phone]

## 2015-04-26 NOTE — Progress Notes (Signed)
Voicemail left on home phone and cell phone for Dale Morrow to notify her that transport was here to transfer patient to the facility, as requested by her.

## 2015-04-26 NOTE — Discharge Instructions (Signed)
Cath site care: No lifting over 5 lbs for 1 week. Keep procedure site clean & dry. If you notice increased pain, swelling, bleeding or pus, call/return!  You may shower, but no soaking baths/hot tubs/pools for 1 week.   Follow with Primary MD WEBB, CAROL D, MD in 7 days   Get CBC, CMP, 2 view Chest X ray checked  by Primary MD next visit.    Activity: As tolerated with Full fall precautions use walker/cane & assistance as needed   Disposition Home     Diet: Heart Healthy Low Carb.  Accuchecks 4 times/day, Once in AM empty stomach and then before each meal. Log in all results and show them to your Prim.MD in 3 days. If any glucose reading is under 80 or above 300 call your Prim MD immidiately. Follow Low glucose instructions for glucose under 80 as instructed.  For Heart failure patients - Check your Weight same time everyday, if you gain over 2 pounds, or you develop in leg swelling, experience more shortness of breath or chest pain, call your Primary MD immediately. Follow Cardiac Low Salt Diet and 1.5 lit/day fluid restriction.   On your next visit with your primary care physician please Get Medicines reviewed and adjusted.   Please request your Prim.MD to go over all Hospital Tests and Procedure/Radiological results at the follow up, please get all Hospital records sent to your Prim MD by signing hospital release before you go home.   If you experience worsening of your admission symptoms, develop shortness of breath, life threatening emergency, suicidal or homicidal thoughts you must seek medical attention immediately by calling 911 or calling your MD immediately  if symptoms less severe.  You Must read complete instructions/literature along with all the possible adverse reactions/side effects for all the Medicines you take and that have been prescribed to you. Take any new Medicines after you have completely understood and accpet all the possible adverse reactions/side effects.    Do not drive, operating heavy machinery, perform activities at heights, swimming or participation in water activities or provide baby sitting services if your were admitted for syncope or siezures until you have seen by Primary MD or a Neurologist and advised to do so again.  Do not drive when taking Pain medications.    Do not take more than prescribed Pain, Sleep and Anxiety Medications  Special Instructions: If you have smoked or chewed Tobacco  in the last 2 yrs please stop smoking, stop any regular Alcohol  and or any Recreational drug use.  Wear Seat belts while driving.   Please note  You were cared for by a hospitalist during your hospital stay. If you have any questions about your discharge medications or the care you received while you were in the hospital after you are discharged, you can call the unit and asked to speak with the hospitalist on call if the hospitalist that took care of you is not available. Once you are discharged, your primary care physician will handle any further medical issues. Please note that NO REFILLS for any discharge medications will be authorized once you are discharged, as it is imperative that you return to your primary care physician (or establish a relationship with a primary care physician if you do not have one) for your aftercare needs so that they can reassess your need for medications and monitor your lab values.

## 2015-04-26 NOTE — Progress Notes (Signed)
Patient: Dale Morrow / Admit Date: 04/19/2015 / Date of Encounter: 04/26/2015, 8:04 AM   Subjective: A+Ox3 today. Denies CP, SOB.  Of note - has h/o bilateral bell's palsy.   Objective: Telemetry: NSR Physical Exam: Blood pressure 120/64, pulse 75, temperature 98 F (36.7 C), temperature source Oral, resp. rate 18, height 5\' 8"  (1.727 m), weight 175 lb 9.6 oz (79.652 kg), SpO2 98 %. General: Well developed, well nourished, in no acute distress. Head: Normocephalic, atraumatic, sclera non-icteric, no xanthomas, nares are without discharge. Neck: Negative for carotid bruits. JVP not elevated. Lungs: Clear bilaterally to auscultation without wheezes, rales, or rhonchi. Breathing is unlabored. Heart: RRR S1 S2 without murmurs, rubs, or gallops.  Abdomen: Soft, non-tender, non-distended with normoactive bowel sounds. No rebound/guarding. Extremities: No clubbing or cyanosis. No edema. Distal pedal pulses are 2+ and equal bilaterally. Neuro: Alert and oriented X 3. Moves all extremities spontaneously. Smile asymmetric with left eye ptosis known to be old.   Intake/Output Summary (Last 24 hours) at 04/26/15 0804 Last data filed at 04/25/15 1812  Gross per 24 hour  Intake    558 ml  Output      0 ml  Net    558 ml    Inpatient Medications:  . amoxicillin-clavulanate  1 tablet Oral Q12H  . antiseptic oral rinse  7 mL Mouth Rinse QID  . aspirin EC  325 mg Oral Daily  . carvedilol  3.125 mg Oral BID WC  . chlorhexidine  15 mL Mouth Rinse BID  . heparin  5,000 Units Subcutaneous 3 times per day  . insulin aspart  0-15 Units Subcutaneous TID WC  . insulin aspart  0-5 Units Subcutaneous QHS  . insulin aspart  3 Units Subcutaneous TID WC  . insulin glargine  12 Units Subcutaneous QHS  . lisinopril  5 mg Oral BID  . pantoprazole  40 mg Oral Q1200   Infusions:  . sodium chloride 10 mL/hr at 04/26/15 2536    Labs:  Recent Labs  04/24/15 0455 04/25/15 0628  NA 137 140  K 3.5 3.7   CL 105 107  CO2 22 23  GLUCOSE 195* 74  BUN 11 10  CREATININE 0.64 0.57*  CALCIUM 7.7* 8.2*    Recent Labs  04/24/15 0455  WBC 4.6  HGB 10.1*  HCT 31.2*  MCV 87.9  PLT 159    Radiology/Studies:  Ct Head Wo Contrast  04/19/2015   CLINICAL DATA:  Found down and unresponsive.  EXAM: CT HEAD WITHOUT CONTRAST  CT CERVICAL SPINE WITHOUT CONTRAST  TECHNIQUE: Multidetector CT imaging of the head and cervical spine was performed following the standard protocol without intravenous contrast. Multiplanar CT image reconstructions of the cervical spine were also generated.  COMPARISON:  None.  FINDINGS: CT HEAD FINDINGS  Skull and Sinuses:Negative for fracture or destructive process. The mastoids, middle ears, and imaged paranasal sinuses are clear.  Orbits: Bilateral cataract resection.  Brain: No evidence of acute infarction, hemorrhage, hydrocephalus, or mass lesion/mass effect. Generalized cerebral volume loss.  CT CERVICAL SPINE FINDINGS  Negative for acute fracture or subluxation. No prevertebral edema. No gross cervical canal hematoma. Multilevel degenerative disc narrowing and facet arthropathy. No significant osseous canal or foraminal stenosis. No endplate erosion or focal bone lesion.  Interlobular septal thickening at the apices.  IMPRESSION: 1. No acute intracranial or cervical spine findings. 2. Pulmonary edema.   Electronically Signed   By: Monte Fantasia M.D.   On: 04/19/2015 01:22   Ct Cervical  Spine Wo Contrast  04/19/2015   CLINICAL DATA:  Found down and unresponsive.  EXAM: CT HEAD WITHOUT CONTRAST  CT CERVICAL SPINE WITHOUT CONTRAST  TECHNIQUE: Multidetector CT imaging of the head and cervical spine was performed following the standard protocol without intravenous contrast. Multiplanar CT image reconstructions of the cervical spine were also generated.  COMPARISON:  None.  FINDINGS: CT HEAD FINDINGS  Skull and Sinuses:Negative for fracture or destructive process. The mastoids, middle  ears, and imaged paranasal sinuses are clear.  Orbits: Bilateral cataract resection.  Brain: No evidence of acute infarction, hemorrhage, hydrocephalus, or mass lesion/mass effect. Generalized cerebral volume loss.  CT CERVICAL SPINE FINDINGS  Negative for acute fracture or subluxation. No prevertebral edema. No gross cervical canal hematoma. Multilevel degenerative disc narrowing and facet arthropathy. No significant osseous canal or foraminal stenosis. No endplate erosion or focal bone lesion.  Interlobular septal thickening at the apices.  IMPRESSION: 1. No acute intracranial or cervical spine findings. 2. Pulmonary edema.   Electronically Signed   By: Monte Fantasia M.D.   On: 04/19/2015 01:22   Mr Brain Wo Contrast  04/19/2015   CLINICAL DATA:  Altered mental status.  Unresponsive.  EXAM: MRI HEAD WITHOUT CONTRAST  TECHNIQUE: Multiplanar, multiecho pulse sequences of the brain and surrounding structures were obtained without intravenous contrast.  COMPARISON:  CT head 04/19/2015  FINDINGS: Mild to moderate atrophy. Mild ventricular enlargement consistent with the level of atrophy.  Negative for acute infarct. Minimal chronic microvascular ischemic change in the white matter.  Pituitary normal in size. Bilateral lens replacement. No orbital mass. Paranasal sinuses clear.  Negative for intracranial hemorrhage or fluid collection.  Negative for mass or edema.  No shift of the midline structures.  IMPRESSION: Generalized atrophy.  Minimal chronic microvascular ischemia.  No acute abnormality.   Electronically Signed   By: Franchot Gallo M.D.   On: 04/19/2015 12:37   US Abdomen Complete  03/29/2015   CLINICAL DATA:  Generalized abdominal pain  EXAM: ULTRASOUND ABDOMEN COMPLETE  COMPARISON:  CT abdomen and pelvis Mar 10, 2015  FINDINGS: Gallbladder: No gallstones or wall thickening visualized. There is no pericholecystic fluid. No sonographic Murphy sign noted.  Common bile duct: Diameter: 5 mm. There is no  intrahepatic, common hepatic, or common bile duct dilatation.  Liver: No focal lesion identified. Within normal limits in parenchymal echogenicity. A tiny cyst near the dome of the liver seen on CT is not appreciable on this ultrasound examination.  IVC: No abnormality visualized.  Pancreas: No mass or inflammatory focus.  Spleen: Size and appearance within normal limits.  Right Kidney: Length: 10.7 cm. Echogenicity within normal limits. No mass or hydronephrosis visualized.  Left Kidney: Length: 10.8 cm. Echogenicity within normal limits. No mass or hydronephrosis visualized.  Abdominal aorta: No aneurysm visualized.  Other findings: No demonstrable ascites.  IMPRESSION: Study within normal limits.   Electronically Signed   By: Lowella Grip III M.D.   On: 03/29/2015 08:43   Nm Myocar Multi W/spect W/wall Motion / Ef  04/24/2015    There was no ST segment deviation noted during stress.  Findings consistent with prior myocardial infarction.  This is a high risk study.  The left ventricular ejection fraction is severely decreased (<30%).   Fixed moderate size and intensity, mid to distal anterior, apical and  inferior wall defects consistent with scar. There is associated akinesis  in these areas, overlying generalized severe hypokinesis of the ventricle.  LVEF is 29%. This could suggest multivessel  CAD. No significant reversible  ischemia was noted. This is a high risk study.  Pixie Casino, MD, [facility] Board Certified in Nuclear Cardiology Attending Cardiologist [place]    [place] 1 View  04/21/2015   CLINICAL DATA:  Pneumonia, extubated  EXAM: PORTABLE CHEST - 1 VIEW  COMPARISON:  04/20/2015  FINDINGS: Stable mild cardiomegaly and minimal interstitial edema pattern throughout the lungs. Improving bibasilar atelectasis. No effusion or pneumothorax. Trachea midline. No acute osseous finding.  IMPRESSION: Cardiomegaly with minimal interstitial edema.  Improving bibasilar atelectasis    Electronically Signed   By: Jerilynn Mages.  Shick M.D.   On: 04/21/2015 07:54   Dg Chest Port 1 View  04/20/2015   CLINICAL DATA:  Acute respiratory failure.  EXAM: PORTABLE CHEST - 1 VIEW  COMPARISON:  04/19/2015.  FINDINGS: Endotracheal tube and NG tube in good anatomic position. Cardiomegaly with interim significant clearing of pulmonary infiltrates consistent with clearing pulmonary edema. Mild to moderate moderate residual. No pleural effusion or pneumothorax. Left costophrenic angle not imaged.  IMPRESSION: 1. Lines and tubes in stable position. 2. Partial clearing of congestive heart failure and pulmonary edema.   Electronically Signed   By: Marcello Moores  Register   On: 04/20/2015 08:03   Dg Chest Port 1 View  04/19/2015   CLINICAL DATA:  Endotracheal tube placement  EXAM: PORTABLE CHEST - 1 VIEW  COMPARISON:  03/13/1999  FINDINGS: Endotracheal tube with tip between the clavicular heads and carina. The orogastric tube reaches the stomach at least.  There is interlobular septal thickening and Kerley lines consistent with pulmonary edema, also seen on the contemporaneous cervical spine CT. Opacity is asymmetric to the right. Normal heart size and mediastinal contours.  IMPRESSION: 1. Endotracheal and orogastric tubes are in good position. 2. Pulmonary edema. 3. Asymmetric opacification of the right chest could be from asymmetric edema or superimposed pneumonia/aspiration.   Electronically Signed   By: Monte Fantasia M.D.   On: 04/19/2015 01:59     Assessment and Plan  38M with DM, prostate CA s/p XRT, depression admitted after being found with unresponsive, posturing, in acute respiratory failure with severe hypoglycemia. Initially felt possibly due to seizure but neuro later felt his AMS was likely secondary to metabolic encephalopathy with prolonged hypoglycemia given that he had had no electrographic seizure activity and MRI was normal. Workup revealed echo 04/19/15 with EF 25-30% with +WMA (?Takotsubo's), PASP  52mmHg.  1. Newly diagnosed cardiomyopathy/elevated troponin - incidental pickup this admission. Nuclear stress test showed EF 29% with several defects c/w scar; EF 29%, could suggest multivessel CAD but no significant reversible ischemia noted. Per d/w MD, plan cath to exclude underlying coronary disease given risk factors of DM and +atherosclerosis seen on prior CT scan of abdomen. Risks and benefits of cardiac catheterization have been discussed with the patient. These include bleeding, infection, kidney damage, stroke, heart attack, death. The patient understands these risks and is willing to proceed. Continue BB, ACEI. BP too low to add spironolactone right now. Consider decreasing ASA to 81mg  daily unless he's on this dose for a particular reason. Add statin if CAD present.  2. Anemia - appears stable over the most recent check.  3. AMS with residual delirium - felt due to prolonged hypoglycemia, but cannot exclude arrhythmia causing downtime given low EF. No arrhythmias seen on tele this admission. Cleared for cath by neuro.  F/u moved to 7/22 at 10am, adjusted in d/c section.  Signed, Melina Copa PA-C Pager: [phone]  Patient evaluated  Agree wth findings as noted by D Dunn above.  See accompanying note i have written.    Dorris Carnes

## 2015-04-26 NOTE — Progress Notes (Signed)
TR BAND REMOVAL  LOCATION:    right radial  DEFLATED PER PROTOCOL:    Yes.    TIME BAND OFF / DRESSING APPLIED:    1300   SITE UPON ARRIVAL:    Level 0  SITE AFTER BAND REMOVAL:    Level 0  CIRCULATION SENSATION AND MOVEMENT:    Within Normal Limits   Yes.    COMMENTS:   Rechecked site at 1330 and noted no change in assessment, dressing dry and intact, level 0 with +2 ulnar and radial pulses palpable.

## 2015-04-26 NOTE — Progress Notes (Signed)
Pt seen  See note by D Dunn earlier this AM Cardiac cath today showed no obstructive CAD  Did have moderate stenosis that was long in LAD   Reviewed note by Lendell Caprice. I would keep on medical Rx with ASA/statin/coreg and ace I I am reluctant to add plavix to his regimen given anemia H/H will need to be followed closely on ASA  Pt will need to be followed closely as outpt  In a few months would get repeat echo to reevaluate LVEF  From a cardiac standpont he is OK to go to rehab.  Dorris Carnes

## 2015-04-27 ENCOUNTER — Other Ambulatory Visit: Payer: Self-pay

## 2015-04-27 ENCOUNTER — Encounter: Payer: Self-pay | Admitting: Adult Health

## 2015-04-27 ENCOUNTER — Non-Acute Institutional Stay (SKILLED_NURSING_FACILITY): Payer: 59 | Admitting: Adult Health

## 2015-04-27 DIAGNOSIS — K5900 Constipation, unspecified: Secondary | ICD-10-CM

## 2015-04-27 DIAGNOSIS — F419 Anxiety disorder, unspecified: Secondary | ICD-10-CM | POA: Diagnosis not present

## 2015-04-27 DIAGNOSIS — R5381 Other malaise: Secondary | ICD-10-CM | POA: Diagnosis not present

## 2015-04-27 DIAGNOSIS — E109 Type 1 diabetes mellitus without complications: Secondary | ICD-10-CM

## 2015-04-27 DIAGNOSIS — F329 Major depressive disorder, single episode, unspecified: Secondary | ICD-10-CM | POA: Diagnosis not present

## 2015-04-27 DIAGNOSIS — F32A Depression, unspecified: Secondary | ICD-10-CM

## 2015-04-27 DIAGNOSIS — N400 Enlarged prostate without lower urinary tract symptoms: Secondary | ICD-10-CM | POA: Diagnosis not present

## 2015-04-27 DIAGNOSIS — I429 Cardiomyopathy, unspecified: Secondary | ICD-10-CM

## 2015-04-27 DIAGNOSIS — M1712 Unilateral primary osteoarthritis, left knee: Secondary | ICD-10-CM | POA: Diagnosis not present

## 2015-04-27 DIAGNOSIS — I2510 Atherosclerotic heart disease of native coronary artery without angina pectoris: Secondary | ICD-10-CM

## 2015-04-27 DIAGNOSIS — K219 Gastro-esophageal reflux disease without esophagitis: Secondary | ICD-10-CM

## 2015-04-27 DIAGNOSIS — J309 Allergic rhinitis, unspecified: Secondary | ICD-10-CM

## 2015-04-27 DIAGNOSIS — J690 Pneumonitis due to inhalation of food and vomit: Secondary | ICD-10-CM

## 2015-04-27 DIAGNOSIS — I10 Essential (primary) hypertension: Secondary | ICD-10-CM | POA: Diagnosis not present

## 2015-04-27 MED ORDER — CLONAZEPAM 0.5 MG PO TABS: ORAL_TABLET | ORAL | Status: DC

## 2015-04-27 MED ORDER — HYDROCODONE-ACETAMINOPHEN 5-325 MG PO TABS: 1.0000 | ORAL_TABLET | Freq: Three times a day (TID) | ORAL | Status: DC

## 2015-04-27 NOTE — Telephone Encounter (Signed)
Rx faxed to [facility] @ [phone], phone number [phone]  

## 2015-04-27 NOTE — Patient Outreach (Signed)
[facility]) Care Management  04/27/2015  Dale Morrow 1941/02/27 [id number] Called wife to check on member's status after discharging to [facility] for skilled nursing after hospitalization for altered metal status due to hypoglycemia, acute respiratory failure and aspiration pneumonia. States that member is still confused at times but he is settling into the facility.  States he is not on his insulin pump and the nurses are giving him his Lantus insulin and meal time insulin.  States that he is to start physicial therapy and she hopes he will get stronger and mentally clearer as time goes by.  States he has follow up appoints scheduled and she plans to also call to have him seen by Dr.Kerr and Jeanie Sewer RD  Instructed to contact RNCM when member is discharged from the facility and plan to follow up on his needs at that time. Peter Garter RN, [facility] Care Management Coordinator-Link to [place] [facility] [phone]

## 2015-04-27 NOTE — Telephone Encounter (Signed)
Spoke with nurse, Beverlee Nims, taking care of patient at [facility]::   Patient's caretaker contacted regarding discharge from [facility] on 05/05/15.  Patient's caretaker (and wife) understands to follow up with provider on 05/05/15 at 10 a.m. at [facility].  Patient's caretaker understands discharge instructions?  Yes  Patient's caretaker understands medications and regiment?  Yes  Patient's caretaker understands to bring all medications to this visit?  Yes  Other details:

## 2015-04-27 NOTE — Progress Notes (Signed)
Patient ID: Dale Morrow, male   DOB: Jul 21, 1941, 74 y.o.   MRN: [id number]   04/27/2015  Facility:  [facility]ursing Home Location:  [place] Room Number: 711-6 LEVEL OF CARE:  SNF (31)   Chief Complaint  Patient presents with  . Hospitalization Follow-up    Physical deconditioning, CAD, cardiomyopathy, aspiration pneumonia, diabetes mellitus type 1, hypertension, depression, osteoarthritis, GERD, constipation, BPH, anxiety and allergic rhinitis    HISTORY OF PRESENT ILLNESS:  This is a 74 year old male who has been admitted to [facility] on 04/26/15 from [facility]. He has PMH of prostate cancer ( S/P XRT), depression, osteoarthritis and diabetes mellitus and was using the insulin pump. He was found unresponsive by his wife at around 11:45 PM, 7/5. He was last seen normal around 3:30 PM, 7/5. Initial CBG by EMS was 33 and he was given D50. He was agitated, DG and posturing. He was intubated in the ED. CT of the brain was unremarkable. He was noted to be hypertensive with SBP in the 200s initially. Neurology was consulted due to suspected seizure activity. Continued workup revealed that the patient's posturing was not likely due to seizure activity. Nuclear stress test showed EF 29% with several defects c/w scar; EF 29%, could suggest multivessel CAD but no significant reversible ischemia noted. He had left heart catheterization on 7/13. Cardiac cath showed no obstructive CADand moderate stenosis that was long in LAD. Cardiology was consulted for patient's cardiomyopathy. One echocardiogram revealed that his EF was 25-30%. Lexiscan was performed which showed akinesis, overlying generalized severe hypokinesis of the ventricle. LVEF is 29%, but no reversible ischemia.  He has been admitted for a short-term rehabilitation.   PAST MEDICAL HISTORY:  Past Medical History  Diagnosis Date  . Arthritis     left knee  . Depression   . De Quervain's tenosynovitis,  left 04/2012  . Tremors of nervous system     takes propranolol for tremors  . History of radiation therapy 3 years ago  . Prostate cancer, primary, with metastasis from prostate to other site     metastasis to lymph nodes  . Complication of anesthesia     pt denies ponv, pt woke up during colonscopy in past  . Diabetes mellitus     Insulin pump, takes lisinopril for kidneys, dr Buddy Duty endocrinologist  . Left-sided Bell's palsy   . Right-sided Bell's palsy     CURRENT MEDICATIONS: Reviewed per MAR/see medication list  Allergies  Allergen Reactions  . Bee Venom Anaphylaxis  . Shellfish Allergy Anaphylaxis     REVIEW OF SYSTEMS:  GENERAL: no change in appetite, no fatigue, no weight changes, no fever, chills or weakness RESPIRATORY: no cough, SOB, DOE, wheezing, hemoptysis CARDIAC: no chest pain, edema or palpitations GI: no abdominal pain, diarrhea, constipation, heart burn, nausea or vomiting  PHYSICAL EXAMINATION  GENERAL: no acute distress, normal body habitus SKIN:  Right wrist has dry dressing, no erythema nor bleeding EYES: conjunctivae normal, sclerae normal, normal eye lids NECK: supple, trachea midline, no neck masses, no thyroid tenderness, no thyromegaly LYMPHATICS: no LAN in the neck, no supraclavicular LAN RESPIRATORY: breathing is even & unlabored, BS CTAB CARDIAC: RRR, no murmur,no extra heart sounds, no edema GI: abdomen soft, normal BS, no masses, no tenderness, no hepatomegaly, no splenomegaly EXTREMITIES: able to move 4 extremities PSYCHIATRIC: the patient is alert & oriented to person, affect & behavior appropriate; has occasional confusion  LABS/RADIOLOGY: Labs reviewed: Basic Metabolic Panel:  Recent Labs  04/19/15 0430  04/22/15 1150 04/23/15 0437 04/24/15 0455 04/25/15 0628 04/26/15 1145  NA 137  < >  --  138 137 140  --   K 5.1  < >  --  3.9 3.5 3.7  --   CL 104  < >  --  105 105 107  --   CO2 21*  < >  --  23 22 23   --   GLUCOSE 183*   < >  --  292* 195* 74  --   BUN 20  < >  --  8 11 10   --   CREATININE 0.85  < >  --  0.85 0.64 0.57* 0.65  CALCIUM 8.3*  < >  --  7.9* 7.7* 8.2*  --   MG 2.2  --  1.9  --   --   --   --   PHOS 2.4*  --   --   --   --   --   --   < > = values in this interval not displayed. Liver Function Tests:  Recent Labs  03/10/15 1604 04/19/15 0051 04/21/15 0211  AST 30 50* 61*  ALT 19 17 19   ALKPHOS 66 71 46  BILITOT 0.3 0.4 0.5  PROT 6.8 6.7 5.4*  ALBUMIN 3.5 3.2* 2.7*    Recent Labs  03/10/15 1604  LIPASE 23    Recent Labs  04/20/15 1018 04/22/15 1150  AMMONIA 94* 33   CBC:  Recent Labs  03/10/15 1604  04/19/15 0051  04/21/15 0211 04/24/15 0455 04/26/15 1145  WBC 5.4  --  11.8*  < > 5.8 4.6 3.8*  NEUTROABS 3.4  --  10.2*  --  4.1  --   --   HGB 10.2*  < > 11.9*  < > 9.1* 10.1* 9.7*  HCT 32.9*  < > 37.1*  < > 28.3* 31.2* 30.0*  MCV 91.4  --  87.5  < > 86.8 87.9 87.0  PLT 218  --  307  < > 155 159 150  < > = values in this interval not displayed.  Lipid Panel:  Recent Labs  04/26/15 1145  HDL 33*   Cardiac Enzymes:  Recent Labs  04/19/15 0130 04/20/15 0209 04/20/15 1306  CKTOTAL 490* 378  --   TROPONINI  --   --  1.01*   CBG:  Recent Labs  04/25/15 2148 04/26/15 0805 04/26/15 1110  GLUCAP 157* 92 76    Ct Head Wo Contrast  04/19/2015   CLINICAL DATA:  Found down and unresponsive.  EXAM: CT HEAD WITHOUT CONTRAST  CT CERVICAL SPINE WITHOUT CONTRAST  TECHNIQUE: Multidetector CT imaging of the head and cervical spine was performed following the standard protocol without intravenous contrast. Multiplanar CT image reconstructions of the cervical spine were also generated.  COMPARISON:  None.  FINDINGS: CT HEAD FINDINGS  Skull and Sinuses:Negative for fracture or destructive process. The mastoids, middle ears, and imaged paranasal sinuses are clear.  Orbits: Bilateral cataract resection.  Brain: No evidence of acute infarction, hemorrhage, hydrocephalus, or  mass lesion/mass effect. Generalized cerebral volume loss.  CT CERVICAL SPINE FINDINGS  Negative for acute fracture or subluxation. No prevertebral edema. No gross cervical canal hematoma. Multilevel degenerative disc narrowing and facet arthropathy. No significant osseous canal or foraminal stenosis. No endplate erosion or focal bone lesion.  Interlobular septal thickening at the apices.  IMPRESSION: 1. No acute intracranial or cervical spine findings. 2. Pulmonary edema.   Electronically  Signed   By: Monte Fantasia M.D.   On: 04/19/2015 01:22   Ct Cervical Spine Wo Contrast  04/19/2015   CLINICAL DATA:  Found down and unresponsive.  EXAM: CT HEAD WITHOUT CONTRAST  CT CERVICAL SPINE WITHOUT CONTRAST  TECHNIQUE: Multidetector CT imaging of the head and cervical spine was performed following the standard protocol without intravenous contrast. Multiplanar CT image reconstructions of the cervical spine were also generated.  COMPARISON:  None.  FINDINGS: CT HEAD FINDINGS  Skull and Sinuses:Negative for fracture or destructive process. The mastoids, middle ears, and imaged paranasal sinuses are clear.  Orbits: Bilateral cataract resection.  Brain: No evidence of acute infarction, hemorrhage, hydrocephalus, or mass lesion/mass effect. Generalized cerebral volume loss.  CT CERVICAL SPINE FINDINGS  Negative for acute fracture or subluxation. No prevertebral edema. No gross cervical canal hematoma. Multilevel degenerative disc narrowing and facet arthropathy. No significant osseous canal or foraminal stenosis. No endplate erosion or focal bone lesion.  Interlobular septal thickening at the apices.  IMPRESSION: 1. No acute intracranial or cervical spine findings. 2. Pulmonary edema.   Electronically Signed   By: Monte Fantasia M.D.   On: 04/19/2015 01:22   Mr Brain Wo Contrast  04/19/2015   CLINICAL DATA:  Altered mental status.  Unresponsive.  EXAM: MRI HEAD WITHOUT CONTRAST  TECHNIQUE: Multiplanar, multiecho pulse  sequences of the brain and surrounding structures were obtained without intravenous contrast.  COMPARISON:  CT head 04/19/2015  FINDINGS: Mild to moderate atrophy. Mild ventricular enlargement consistent with the level of atrophy.  Negative for acute infarct. Minimal chronic microvascular ischemic change in the white matter.  Pituitary normal in size. Bilateral lens replacement. No orbital mass. Paranasal sinuses clear.  Negative for intracranial hemorrhage or fluid collection.  Negative for mass or edema.  No shift of the midline structures.  IMPRESSION: Generalized atrophy.  Minimal chronic microvascular ischemia.  No acute abnormality.   Electronically Signed   By: Franchot Gallo M.D.   On: 04/19/2015 12:37   US Abdomen Complete  03/29/2015   CLINICAL DATA:  Generalized abdominal pain  EXAM: ULTRASOUND ABDOMEN COMPLETE  COMPARISON:  CT abdomen and pelvis Mar 10, 2015  FINDINGS: Gallbladder: No gallstones or wall thickening visualized. There is no pericholecystic fluid. No sonographic Murphy sign noted.  Common bile duct: Diameter: 5 mm. There is no intrahepatic, common hepatic, or common bile duct dilatation.  Liver: No focal lesion identified. Within normal limits in parenchymal echogenicity. A tiny cyst near the dome of the liver seen on CT is not appreciable on this ultrasound examination.  IVC: No abnormality visualized.  Pancreas: No mass or inflammatory focus.  Spleen: Size and appearance within normal limits.  Right Kidney: Length: 10.7 cm. Echogenicity within normal limits. No mass or hydronephrosis visualized.  Left Kidney: Length: 10.8 cm. Echogenicity within normal limits. No mass or hydronephrosis visualized.  Abdominal aorta: No aneurysm visualized.  Other findings: No demonstrable ascites.  IMPRESSION: Study within normal limits.   Electronically Signed   By: Lowella Grip III M.D.   On: 03/29/2015 08:43   Nm Myocar Multi W/spect W/wall Motion / Ef  04/24/2015    There was no ST segment  deviation noted during stress.  Findings consistent with prior myocardial infarction.  This is a high risk study.  The left ventricular ejection fraction is severely decreased (<30%).   Fixed moderate size and intensity, mid to distal anterior, apical and  inferior wall defects consistent with scar. There is associated akinesis  in  these areas, overlying generalized severe hypokinesis of the ventricle.  LVEF is 29%. This could suggest multivessel CAD. No significant reversible  ischemia was noted. This is a high risk study.  Pixie Casino, MD, [facility] Board Certified in Nuclear Cardiology Attending Cardiologist [place]    [place] 1 View  04/21/2015   CLINICAL DATA:  Pneumonia, extubated  EXAM: PORTABLE CHEST - 1 VIEW  COMPARISON:  04/20/2015  FINDINGS: Stable mild cardiomegaly and minimal interstitial edema pattern throughout the lungs. Improving bibasilar atelectasis. No effusion or pneumothorax. Trachea midline. No acute osseous finding.  IMPRESSION: Cardiomegaly with minimal interstitial edema.  Improving bibasilar atelectasis   Electronically Signed   By: Jerilynn Mages.  Shick M.D.   On: 04/21/2015 07:54   Dg Chest Port 1 View  04/20/2015   CLINICAL DATA:  Acute respiratory failure.  EXAM: PORTABLE CHEST - 1 VIEW  COMPARISON:  04/19/2015.  FINDINGS: Endotracheal tube and NG tube in good anatomic position. Cardiomegaly with interim significant clearing of pulmonary infiltrates consistent with clearing pulmonary edema. Mild to moderate moderate residual. No pleural effusion or pneumothorax. Left costophrenic angle not imaged.  IMPRESSION: 1. Lines and tubes in stable position. 2. Partial clearing of congestive heart failure and pulmonary edema.   Electronically Signed   By: Marcello Moores  Register   On: 04/20/2015 08:03   Dg Chest Port 1 View  04/19/2015   CLINICAL DATA:  Endotracheal tube placement  EXAM: PORTABLE CHEST - 1 VIEW  COMPARISON:  03/13/1999  FINDINGS: Endotracheal tube with tip between the  clavicular heads and carina. The orogastric tube reaches the stomach at least.  There is interlobular septal thickening and Kerley lines consistent with pulmonary edema, also seen on the contemporaneous cervical spine CT. Opacity is asymmetric to the right. Normal heart size and mediastinal contours.  IMPRESSION: 1. Endotracheal and orogastric tubes are in good position. 2. Pulmonary edema. 3. Asymmetric opacification of the right chest could be from asymmetric edema or superimposed pneumonia/aspiration.   Electronically Signed   By: Monte Fantasia M.D.   On: 04/19/2015 01:59    ASSESSMENT/PLAN:  Physical deconditioning - for rehabilitation  CAD - S/P left heart catheterization 7/13; start have ASA 81 mg PO Q D  Cardiomyopathy - continue lisinopril 5 mg 1 tab by mouth twice a day and carvedilol 3.125 mg by mouth twice a day; follow-up with cardiology; for repeat echocardiography in 2 weeks; check magnesium and BMP   Hypertension - continue Coreg 3.125 mg 1 tab by mouth twice a day and lisinopril 5 mg 1 tab by mouth twice a day  Depression - mood is stable; continue Lexapro 20 mg 1 tab by mouth daily  Aspiration pneumonia - continue Augmentin 875-125 mg 1 tab by mouth every 12 hours 1 day  Diabetes mellitus, type I - will not restart insulin pump at this time; continue Lantus 25 units subcutaneous daily at bedtime and [place] 3 units subcutaneous 3 times a day with meals; follow-up with Dr. Buddy Duty, endocrinology at [facility] physician  Osteoarthritis of left knee - continue Norco 5/325 mg 1 tab by mouth every 8 hours  GERD - continue Nexium 24 hour 1 tab by mouth every 12 noon  Constipation - continue MiraLAX 17 g by mouth daily at bedtime  BPH - continue Flomax 0.4 mg 1 capsule by mouth daily  Anxiety - mood is stable; continue Klonopin 0.5 mg 1 tab by mouth twice a day when necessary and Klonopin 0.5 mg 2 tabs by mouth daily  at bedtime when necessary  Allergic rhinitis - continue  Flonase 50 g/ACT 1 spray into both nostrils daily    Goals of care:  Short-term rehabilitation     [facility], NP [facility] [phone]

## 2015-04-27 NOTE — Telephone Encounter (Signed)
Spoke with patient's wife who reports patient is at [facility]. Reviewed appt for 7/22 with her and gave her office contact info. Will speak with patient's nurse at [facility] to d/c instructions, confirm medications and next week's appt.

## 2015-04-28 ENCOUNTER — Ambulatory Visit: Payer: 59 | Admitting: Physical Therapy

## 2015-04-28 ENCOUNTER — Non-Acute Institutional Stay (SKILLED_NURSING_FACILITY): Payer: 59 | Admitting: Internal Medicine

## 2015-04-28 DIAGNOSIS — J690 Pneumonitis due to inhalation of food and vomit: Secondary | ICD-10-CM | POA: Diagnosis not present

## 2015-04-28 DIAGNOSIS — M1712 Unilateral primary osteoarthritis, left knee: Secondary | ICD-10-CM | POA: Diagnosis not present

## 2015-04-28 DIAGNOSIS — R5381 Other malaise: Secondary | ICD-10-CM | POA: Diagnosis not present

## 2015-04-28 DIAGNOSIS — F418 Other specified anxiety disorders: Secondary | ICD-10-CM | POA: Diagnosis not present

## 2015-04-28 DIAGNOSIS — E108 Type 1 diabetes mellitus with unspecified complications: Secondary | ICD-10-CM

## 2015-04-28 DIAGNOSIS — I429 Cardiomyopathy, unspecified: Secondary | ICD-10-CM

## 2015-04-28 DIAGNOSIS — K219 Gastro-esophageal reflux disease without esophagitis: Secondary | ICD-10-CM

## 2015-04-28 DIAGNOSIS — I10 Essential (primary) hypertension: Secondary | ICD-10-CM

## 2015-04-28 NOTE — Progress Notes (Signed)
Patient ID: Dale Morrow, male   DOB: 14-Sep-1941, 74 y.o.   MRN: [id number]     [facility]   PCP: WEBB, Valla Leaver, MD  Code Status: full code  Allergies  Allergen Reactions  . Bee Venom Anaphylaxis  . Shellfish Allergy Anaphylaxis    Chief Complaint  Patient presents with  . New Admit To SNF     HPI:  74 y.o. patient is here for short term rehabilitation post hospital admission from 04/19/15- 04/25/15 with unresponsiveness. cbg by EMS was noted to be 33 and he received D50. He was found to be agitated and posturing after that. He was intubated, found to be hypertensive and there were concerns for seizures. Neurology was consulted and he was started on keppra.later EEG did not reveal any seizure activity and keppra was discontinued. Ct head was negative for intracranial abnormalities. He was noted to have EF of 25%. He underwent cardiac catheterization on 04/26/15 showing no obstructive CAD. Lexiscan was performed which showed akinesis in these areas, overlying generalized severe hypokinesis of the ventricle. LVEF is 29% , but no reversible ischemia. he was treated for aspiration pneumonia in the hospital. He is seen in his room today. He has PMH of DM, prostate cancer, depression. He denies any concerns today.    Review of Systems:  Constitutional: Negative for fever, chills, diaphoresis.  HENT: Negative for headache, congestion, nasal discharge Eyes: Negative for eye pain, blurred vision, double vision and discharge.  Respiratory: Negative for cough, shortness of breath and wheezing.   Cardiovascular: Negative for chest pain, palpitations, leg swelling.  Gastrointestinal: Negative for heartburn, nausea, vomiting, abdominal pain, loss of appetite, diarrhea and constipation.  Genitourinary: Negative for dysuria, flank pain.  Musculoskeletal: Negative for back pain, falls Skin: Negative for itching, rash.  Neurological: Negative for dizziness, tingling,  focal weakness   Past Medical History  Diagnosis Date  . Arthritis     left knee  . Depression   . De Quervain's tenosynovitis, left 04/2012  . Tremors of nervous system     takes propranolol for tremors  . History of radiation therapy 3 years ago  . Prostate cancer, primary, with metastasis from prostate to other site     metastasis to lymph nodes  . Complication of anesthesia     pt denies ponv, pt woke up during colonscopy in past  . Diabetes mellitus     Insulin pump, takes lisinopril for kidneys, dr Buddy Duty endocrinologist  . Left-sided Bell's palsy   . Right-sided Bell's palsy    Past Surgical History  Procedure Laterality Date  . Hemorrhoid surgery  1970s  . Anterior cruciate ligament repair      right knee  . Wrist arthroscopy  05/03/2010    right; and release of 1st dorsal compartment  . Knee arthrotomy  03/18/2008    left; with scar exc.  [name] Knee arthroscopy  09/25/2007; 04/15/2005    left  . Knee joint manipulation  06/29/2007    left  . Total knee arthroplasty  04/29/2007    left  . Dorsal compartment release  04/21/2012    Procedure: RELEASE DORSAL COMPARTMENT (DEQUERVAIN);  Surgeon: Cammie Sickle., MD;  Location: [facility];  Service: Orthopedics;  Laterality: Left;  left 1st dorsal compartment release left wrist   . Minor amputation of digit Left 07/21/2013    Procedure: LEFT SMALL REVISION AMPUTATION (MINOR PROCEDURE) ;  Surgeon: Schuyler Amor, MD;  Location: [facility];  Service: Orthopedics;  Laterality: Left;  . Colonoscopy with propofol N/A 04/07/2014    Procedure: COLONOSCOPY WITH PROPOFOL;  Surgeon: Jeryl Columbia, MD;  Location: [facility];  Service: Endoscopy;  Laterality: N/A;  . Total knee revision Left 01/25/2015    Procedure: LEFT TOTAL KNEE  ARTHOPLASTY REVISION;  Surgeon: Gaynelle Arabian, MD;  Location: [facility];  Service: Orthopedics;  Laterality: Left;  . Cardiac catheterization N/A 04/26/2015    Procedure: Left Heart  Cath and Coronary Angiography;  Surgeon: Jettie Booze, MD;  Location: [place] [facility];  Service: Cardiovascular;  Laterality: N/A;  . Cardiac catheterization  04/26/2015    Procedure: Intravascular Ultrasound/IVUS;  Surgeon: Jettie Booze, MD;  Location: [place] [facility];  Service: Cardiovascular;;   Social History:   reports that he quit smoking about 35 years ago. His smoking use included Cigarettes. He quit after 15 years of use. He has never used smokeless tobacco. He reports that he does not drink alcohol or use illicit drugs.  Family History  Problem Relation Age of Onset  . Heart Problems Father     Medications:   Medication List       This list is accurate as of: 04/28/15 12:05 PM.  Always use your most recent med list.               amoxicillin-clavulanate 875-125 MG per tablet  Commonly known as:  AUGMENTIN  Take 1 tablet by mouth every 12 (twelve) hours.     aspirin 325 MG EC tablet  Take 1 tablet (325 mg total) by mouth daily.     CALCIUM-VITAMIN D PO  Take 1 tablet by mouth daily at 12 noon.     carvedilol 3.125 MG tablet  Commonly known as:  COREG  Take 1 tablet (3.125 mg total) by mouth 2 (two) times daily with a meal.     clonazePAM 0.5 MG tablet  Commonly known as:  KLONOPIN  1 tablet by mouth twice a day as needed for anxiety, 2 by mouth every night at bedtime for insomnia     denosumab 60 MG/ML Soln injection  Commonly known as:  PROLIA  Inject 60 mg into the skin every 6 (six) months. Administer in upper arm, thigh, or abdomen     escitalopram 20 MG tablet  Commonly known as:  LEXAPRO  TAKE 1 TABLET BY MOUTH ONCE DAILY     fluticasone 50 MCG/ACT nasal spray  Commonly known as:  FLONASE  Place 1 spray into both nostrils daily as needed for allergies or rhinitis.     HYDROcodone-acetaminophen 5-325 MG per tablet  Commonly known as:  NORCO/VICODIN  Take 1 tablet by mouth every 8 (eight) hours. Do Not Exceed 4 GM of tylenol in 24  hours     insulin aspart 100 UNIT/ML injection  Commonly known as:  novoLOG  Inject 3 Units into the skin 3 (three) times daily with meals.     insulin glargine 100 UNIT/ML injection  Commonly known as:  LANTUS  Inject 0.25 mLs (25 Units total) into the skin at bedtime.     lisinopril 5 MG tablet  Commonly known as:  PRINIVIL,ZESTRIL  Take 1 tablet (5 mg total) by mouth 2 (two) times daily.     NEXIUM 24HR PO  Take 1 tablet by mouth daily at 12 noon.     ONE-A-DAY MENS 50+ ADVANTAGE PO  Take 1 tablet by mouth daily at 12 noon.  polyethylene glycol powder powder  Commonly known as:  GLYCOLAX/MIRALAX  Take 17 g by mouth at bedtime.     rosuvastatin 10 MG tablet  Commonly known as:  CRESTOR  Take 10 mg by mouth every morning.     tamsulosin 0.4 MG Caps capsule  Commonly known as:  FLOMAX  TAKE 1 CAPSULE BY MOUTH ONCE DAILY         Physical Exam: Filed Vitals:   04/28/15 1204  BP: 134/71  Pulse: 74  Temp: 98.4 F (36.9 C)  Resp: 18  SpO2: 98%    General- elderly male, well built, in no acute distress Head- normocephalic, atraumatic Throat- moist mucus membrane Eyes- PERRLA, EOMI, no pallor, no icterus, no discharge, normal conjunctiva, normal sclera Neck- no cervical lymphadenopathy Cardiovascular- normal s1,s2, no murmurs, palpable dorsalis pedis, trace leg edema Respiratory- bilateral clear to auscultation, no wheeze, no rhonchi, no crackles, no use of accessory muscles Abdomen- bowel sounds present, soft, non tender Musculoskeletal- able to move all 4 extremities, generalized weakness Neurological- no focal deficit, alert and oriented to person and place Skin- warm and dry Psychiatry- normal mood and affect    Labs reviewed: Basic Metabolic Panel:  Recent Labs  04/19/15 0430  04/22/15 1150 04/23/15 0437 04/24/15 0455 04/25/15 0628 04/26/15 1145  NA 137  < >  --  138 137 140  --   K 5.1  < >  --  3.9 3.5 3.7  --   CL 104  < >  --  105 105  107  --   CO2 21*  < >  --  23 22 23   --   GLUCOSE 183*  < >  --  292* 195* 74  --   BUN 20  < >  --  8 11 10   --   CREATININE 0.85  < >  --  0.85 0.64 0.57* 0.65  CALCIUM 8.3*  < >  --  7.9* 7.7* 8.2*  --   MG 2.2  --  1.9  --   --   --   --   PHOS 2.4*  --   --   --   --   --   --   < > = values in this interval not displayed. Liver Function Tests:  Recent Labs  03/10/15 1604 04/19/15 0051 04/21/15 0211  AST 30 50* 61*  ALT 19 17 19   ALKPHOS 66 71 46  BILITOT 0.3 0.4 0.5  PROT 6.8 6.7 5.4*  ALBUMIN 3.5 3.2* 2.7*    Recent Labs  03/10/15 1604  LIPASE 23    Recent Labs  04/20/15 1018 04/22/15 1150  AMMONIA 94* 33   CBC:  Recent Labs  03/10/15 1604  04/19/15 0051  04/21/15 0211 04/24/15 0455 04/26/15 1145  WBC 5.4  --  11.8*  < > 5.8 4.6 3.8*  NEUTROABS 3.4  --  10.2*  --  4.1  --   --   HGB 10.2*  < > 11.9*  < > 9.1* 10.1* 9.7*  HCT 32.9*  < > 37.1*  < > 28.3* 31.2* 30.0*  MCV 91.4  --  87.5  < > 86.8 87.9 87.0  PLT 218  --  307  < > 155 159 150  < > = values in this interval not displayed. Cardiac Enzymes:  Recent Labs  04/19/15 0130 04/20/15 0209 04/20/15 1306  CKTOTAL 490* 378  --   TROPONINI  --   --  1.01*   BNP:  Invalid input(s): POCBNP CBG:  Recent Labs  04/25/15 2148 04/26/15 0805 04/26/15 1110  GLUCAP 157* 92 76    Radiological Exams: Ct Head Wo Contrast  04/19/2015   CLINICAL DATA:  Found down and unresponsive.  EXAM: CT HEAD WITHOUT CONTRAST  CT CERVICAL SPINE WITHOUT CONTRAST  TECHNIQUE: Multidetector CT imaging of the head and cervical spine was performed following the standard protocol without intravenous contrast. Multiplanar CT image reconstructions of the cervical spine were also generated.  COMPARISON:  None.  FINDINGS: CT HEAD FINDINGS  Skull and Sinuses:Negative for fracture or destructive process. The mastoids, middle ears, and imaged paranasal sinuses are clear.  Orbits: Bilateral cataract resection.  Brain: No evidence  of acute infarction, hemorrhage, hydrocephalus, or mass lesion/mass effect. Generalized cerebral volume loss.  CT CERVICAL SPINE FINDINGS  Negative for acute fracture or subluxation. No prevertebral edema. No gross cervical canal hematoma. Multilevel degenerative disc narrowing and facet arthropathy. No significant osseous canal or foraminal stenosis. No endplate erosion or focal bone lesion.  Interlobular septal thickening at the apices.  IMPRESSION: 1. No acute intracranial or cervical spine findings. 2. Pulmonary edema.   Electronically Signed   By: Monte Fantasia M.D.   On: 04/19/2015 01:22   Ct Cervical Spine Wo Contrast  04/19/2015   CLINICAL DATA:  Found down and unresponsive.  EXAM: CT HEAD WITHOUT CONTRAST  CT CERVICAL SPINE WITHOUT CONTRAST  TECHNIQUE: Multidetector CT imaging of the head and cervical spine was performed following the standard protocol without intravenous contrast. Multiplanar CT image reconstructions of the cervical spine were also generated.  COMPARISON:  None.  FINDINGS: CT HEAD FINDINGS  Skull and Sinuses:Negative for fracture or destructive process. The mastoids, middle ears, and imaged paranasal sinuses are clear.  Orbits: Bilateral cataract resection.  Brain: No evidence of acute infarction, hemorrhage, hydrocephalus, or mass lesion/mass effect. Generalized cerebral volume loss.  CT CERVICAL SPINE FINDINGS  Negative for acute fracture or subluxation. No prevertebral edema. No gross cervical canal hematoma. Multilevel degenerative disc narrowing and facet arthropathy. No significant osseous canal or foraminal stenosis. No endplate erosion or focal bone lesion.  Interlobular septal thickening at the apices.  IMPRESSION: 1. No acute intracranial or cervical spine findings. 2. Pulmonary edema.   Electronically Signed   By: Monte Fantasia M.D.   On: 04/19/2015 01:22   Mr Brain Wo Contrast  04/19/2015   CLINICAL DATA:  Altered mental status.  Unresponsive.  EXAM: MRI HEAD WITHOUT  CONTRAST  TECHNIQUE: Multiplanar, multiecho pulse sequences of the brain and surrounding structures were obtained without intravenous contrast.  COMPARISON:  CT head 04/19/2015  FINDINGS: Mild to moderate atrophy. Mild ventricular enlargement consistent with the level of atrophy.  Negative for acute infarct. Minimal chronic microvascular ischemic change in the white matter.  Pituitary normal in size. Bilateral lens replacement. No orbital mass. Paranasal sinuses clear.  Negative for intracranial hemorrhage or fluid collection.  Negative for mass or edema.  No shift of the midline structures.  IMPRESSION: Generalized atrophy.  Minimal chronic microvascular ischemia.  No acute abnormality.   Electronically Signed   By: Franchot Gallo M.D.   On: 04/19/2015 12:37   Dg Chest Port 1 View  04/20/2015   CLINICAL DATA:  Acute respiratory failure.  EXAM: PORTABLE CHEST - 1 VIEW  COMPARISON:  04/19/2015.  FINDINGS: Endotracheal tube and NG tube in good anatomic position. Cardiomegaly with interim significant clearing of pulmonary infiltrates consistent with clearing pulmonary edema. Mild to moderate moderate residual. No pleural effusion or  pneumothorax. Left costophrenic angle not imaged.  IMPRESSION: 1. Lines and tubes in stable position. 2. Partial clearing of congestive heart failure and pulmonary edema.   Electronically Signed   By: Marcello Moores  Register   On: 04/20/2015 08:03   Dg Chest Port 1 View  04/19/2015   CLINICAL DATA:  Endotracheal tube placement  EXAM: PORTABLE CHEST - 1 VIEW  COMPARISON:  03/13/1999  FINDINGS: Endotracheal tube with tip between the clavicular heads and carina. The orogastric tube reaches the stomach at least.  There is interlobular septal thickening and Kerley lines consistent with pulmonary edema, also seen on the contemporaneous cervical spine CT. Opacity is asymmetric to the right. Normal heart size and mediastinal contours.  IMPRESSION: 1. Endotracheal and orogastric tubes are in good  position. 2. Pulmonary edema. 3. Asymmetric opacification of the right chest could be from asymmetric edema or superimposed pneumonia/aspiration.   Electronically Signed   By: Monte Fantasia M.D.   On: 04/19/2015 01:59     Assessment/Plan   Physical deconditioning Will have him work with physical therapy and occupational therapy team to help with gait training and muscle strengthening exercises.fall precautions. Skin care. Encourage to be out of bed.   Aspiration pneumonia Currently asymptomatic, completed course of augmentin, monitor clinically  Non ischemic Cardiomyopathy S/P left heart catheterization 7/13. Continue aspirin 81 mg daily with lisinopril 5 mg bid, carvedilol 3.125 mg bid and pending cardiology f/u in 2 weeks. Remains chest pain free. Has repeat echocardiogram in 2 weeks  Hypertension Stable bp. continue lisinopril 5 mg bid, carvedilol 3.125 mg bid and monitor bp readings.  Diabetes Continue lantus 25 u daily with SSI with meals. Monitor cbg Lab Results  Component Value Date   HGBA1C 7.3* 04/22/2015   gerd Stable, continue nexium daily  Depression with anxiety continue Lexapro 20 mg daily, klonopin 0.5 mg bid prn and monitor  Osteoarthritis of left knee continue Norco 5/325 mg q8h and monitor. Continue miralax and hydration encouraged.    Goals of care: short term rehabilitation   Labs/tests ordered: bmp, mg  Family/ staff Communication: reviewed care plan with patient and nursing supervisor    Blanchie Serve, MD  [facility] [phone] (Monday-Friday 8 am - 5 pm) [phone] (afterhours)

## 2015-05-03 ENCOUNTER — Non-Acute Institutional Stay (SKILLED_NURSING_FACILITY): Payer: 59 | Admitting: Adult Health

## 2015-05-03 ENCOUNTER — Encounter: Payer: Self-pay | Admitting: Adult Health

## 2015-05-03 DIAGNOSIS — R4182 Altered mental status, unspecified: Secondary | ICD-10-CM | POA: Diagnosis not present

## 2015-05-03 DIAGNOSIS — I429 Cardiomyopathy, unspecified: Secondary | ICD-10-CM

## 2015-05-03 DIAGNOSIS — J690 Pneumonitis due to inhalation of food and vomit: Secondary | ICD-10-CM

## 2015-05-03 NOTE — Progress Notes (Signed)
Patient ID: Dale Morrow, male   DOB: 09-30-1941, 74 y.o.   MRN: [id number]  Facility: [facility]       Allergies  Allergen Reactions  . Bee Venom Anaphylaxis  . Shellfish Allergy Anaphylaxis    Chief Complaint  Patient presents with  . Discharge Note    Discharge from SNF    HPI:    Past Medical History  Diagnosis Date  . Arthritis     left knee  . Depression   . De Quervain's tenosynovitis, left 04/2012  . Tremors of nervous system     takes propranolol for tremors  . History of radiation therapy 3 years ago  . Prostate cancer, primary, with metastasis from prostate to other site     metastasis to lymph nodes  . Complication of anesthesia     pt denies ponv, pt woke up during colonscopy in past  . Diabetes mellitus     Insulin pump, takes lisinopril for kidneys, dr Buddy Duty endocrinologist  . Left-sided Bell's palsy   . Right-sided Bell's palsy     Past Surgical History  Procedure Laterality Date  . Hemorrhoid surgery  1970s  . Anterior cruciate ligament repair      right knee  . Wrist arthroscopy  05/03/2010    right; and release of 1st dorsal compartment  . Knee arthrotomy  03/18/2008    left; with scar exc.  [name] Knee arthroscopy  09/25/2007; 04/15/2005    left  . Knee joint manipulation  06/29/2007    left  . Total knee arthroplasty  04/29/2007    left  . Dorsal compartment release  04/21/2012    Procedure: RELEASE DORSAL COMPARTMENT (DEQUERVAIN);  Surgeon: Cammie Sickle., MD;  Location: [facility];  Service: Orthopedics;  Laterality: Left;  left 1st dorsal compartment release left wrist   . Minor amputation of digit Left 07/21/2013    Procedure: LEFT SMALL REVISION AMPUTATION (MINOR PROCEDURE) ;  Surgeon: Schuyler Amor, MD;  Location: [place];  Service: Orthopedics;  Laterality: Left;  . Colonoscopy with propofol N/A 04/07/2014    Procedure: COLONOSCOPY WITH PROPOFOL;  Surgeon: Jeryl Columbia, MD;  Location:  [facility];  Service: Endoscopy;  Laterality: N/A;  . Total knee revision Left 01/25/2015    Procedure: LEFT TOTAL KNEE  ARTHOPLASTY REVISION;  Surgeon: Gaynelle Arabian, MD;  Location: [facility];  Service: Orthopedics;  Laterality: Left;  . Cardiac catheterization N/A 04/26/2015    Procedure: Left Heart Cath and Coronary Angiography;  Surgeon: Jettie Booze, MD;  Location: [place] [facility];  Service: Cardiovascular;  Laterality: N/A;  . Cardiac catheterization  04/26/2015    Procedure: Intravascular Ultrasound/IVUS;  Surgeon: Jettie Booze, MD;  Location: [place] [facility];  Service: Cardiovascular;;    VITAL SIGNS BP 117/58 mmHg  Pulse 69  Temp(Src) 98.2 F (36.8 C) (Oral)  Resp 18  Ht 5\' 8"  (1.727 m)  Wt 182 lb 9.6 oz (82.827 kg)  BMI 27.77 kg/m2  Patient's Medications  New Prescriptions   No medications on file  Previous Medications   AMOXICILLIN-CLAVULANATE (AUGMENTIN) 875-125 MG PER TABLET    Take 1 tablet by mouth every 12 (twelve) hours.   ASPIRIN EC 81 MG TABLET    Take 81 mg by mouth daily.   CALCIUM-VITAMIN D PO    Take 1 tablet by mouth daily at 12 noon.   CARVEDILOL (COREG) 3.125 MG TABLET    Take 1  tablet (3.125 mg total) by mouth 2 (two) times daily with a meal.   CLONAZEPAM (KLONOPIN) 0.5 MG TABLET    1 tablet by mouth twice a day as needed for anxiety, 2 by mouth every night at bedtime for insomnia   ESCITALOPRAM (LEXAPRO) 20 MG TABLET    TAKE 1 TABLET BY MOUTH ONCE DAILY   ESOMEPRAZOLE MAGNESIUM (NEXIUM 24HR PO)    Take 1 tablet by mouth daily at 12 noon.    FLUTICASONE (FLONASE) 50 MCG/ACT NASAL SPRAY    Place 1 spray into both nostrils daily as needed for allergies or rhinitis.   INSULIN ASPART (NOVOLOG) 100 UNIT/ML INJECTION    Inject 3 Units into the skin 3 (three) times daily with meals.   INSULIN GLARGINE (LANTUS) 100 UNIT/ML INJECTION    Inject 0.25 mLs (25 Units total) into the skin at bedtime.   LISINOPRIL (PRINIVIL,ZESTRIL) 5 MG TABLET    Take 1  tablet (5 mg total) by mouth 2 (two) times daily.   MULTIPLE VITAMINS-MINERALS (ONE-A-DAY MENS 50+ ADVANTAGE PO)    Take 1 tablet by mouth daily at 12 noon.   POLYETHYLENE GLYCOL POWDER (GLYCOLAX/MIRALAX) POWDER    Take 17 g by mouth at bedtime.    ROSUVASTATIN (CRESTOR) 10 MG TABLET    Take 10 mg by mouth every morning.    TAMSULOSIN (FLOMAX) 0.4 MG CAPS CAPSULE    TAKE 1 CAPSULE BY MOUTH ONCE DAILY  Modified Medications   Modified Medication Previous Medication   HYDROCODONE-ACETAMINOPHEN (NORCO/VICODIN) 5-325 MG PER TABLET HYDROcodone-acetaminophen (NORCO/VICODIN) 5-325 MG per tablet      Take 1 tablet by mouth every 6 (six) hours as needed. And 2 by mouth at 10 pm,Do Not Exceed 4 GM of tylenol in 24 hours    Take 1 tablet by mouth every 8 (eight) hours. Do Not Exceed 4 GM of tylenol in 24 hours  Discontinued Medications   ASPIRIN EC 325 MG EC TABLET    Take 1 tablet (325 mg total) by mouth daily.   DENOSUMAB (PROLIA) 60 MG/ML SOLN INJECTION    Inject 60 mg into the skin every 6 (six) months. Administer in upper arm, thigh, or abdomen     SIGNIFICANT DIAGNOSTIC EXAMS    Review of Systems    Physical Exam     ASSESSMENT/ PLAN:    Ok Edwards NP [facility]  Contact [phone] Monday through Friday 8am- 5pm  After hours call [phone]

## 2015-05-04 NOTE — Progress Notes (Signed)
Patient ID: Dale Morrow, male   DOB: [birth year], 74 y.o.   MRN: [id number]   Facility: [facility]       Allergies  Allergen Reactions  . Bee Venom Anaphylaxis  . Shellfish Allergy Anaphylaxis    Chief Complaint  Patient presents with  . Discharge Note    Discharge from SNF    HPI:  He had been hospitalized for nonischemic cardiomyopathy; pneumonia and physical deconditioning. He was admitted to this facility for short term rehab and is ready for discharge to home.  He is being discharged to home with outpatient therapy. He will not need dme. He will need his prescriptions to be written and will need a follow up with his pcp.    Past Medical History  Diagnosis Date  . Arthritis     left knee  . Depression   . De Quervain's tenosynovitis, left 04/2012  . Tremors of nervous system     takes propranolol for tremors  . History of radiation therapy 3 years ago  . Prostate cancer, primary, with metastasis from prostate to other site     metastasis to lymph nodes  . Complication of anesthesia     pt denies ponv, pt woke up during colonscopy in past  . Diabetes mellitus     Insulin pump, takes lisinopril for kidneys, dr Buddy Duty endocrinologist  . Left-sided Bell's palsy   . Right-sided Bell's palsy     Past Surgical History  Procedure Laterality Date  . Hemorrhoid surgery  1970s  . Anterior cruciate ligament repair      right knee  . Wrist arthroscopy  05/03/2010    right; and release of 1st dorsal compartment  . Knee arthrotomy  03/18/2008    left; with scar exc.  [name] Knee arthroscopy  09/25/2007; 04/15/2005    left  . Knee joint manipulation  06/29/2007    left  . Total knee arthroplasty  04/29/2007    left  . Dorsal compartment release  04/21/2012    Procedure: RELEASE DORSAL COMPARTMENT (DEQUERVAIN);  Surgeon: Cammie Sickle., MD;  Location: [facility];  Service: Orthopedics;  Laterality: Left;  left 1st dorsal compartment release left wrist   . Minor  amputation of digit Left 07/21/2013    Procedure: LEFT SMALL REVISION AMPUTATION (MINOR PROCEDURE) ;  Surgeon: Schuyler Amor, MD;  Location: [facility];  Service: Orthopedics;  Laterality: Left;  . Colonoscopy with propofol N/A 04/07/2014    Procedure: COLONOSCOPY WITH PROPOFOL;  Surgeon: Jeryl Columbia, MD;  Location: [facility];  Service: Endoscopy;  Laterality: N/A;  . Total knee revision Left 01/25/2015    Procedure: LEFT TOTAL KNEE  ARTHOPLASTY REVISION;  Surgeon: Gaynelle Arabian, MD;  Location: [facility];  Service: Orthopedics;  Laterality: Left;  . Cardiac catheterization N/A 04/26/2015    Procedure: Left Heart Cath and Coronary Angiography;  Surgeon: Jettie Booze, MD;  Location: [facility];  Service: Cardiovascular;  Laterality: N/A;  . Cardiac catheterization  04/26/2015    Procedure: Intravascular Ultrasound/IVUS;  Surgeon: Jettie Booze, MD;  Location: [facility];  Service: Cardiovascular;;    VITAL SIGNS BP 117/58 mmHg  Pulse 69  Temp(Src) 98.2 F (36.8 C) (Oral)  Resp 18  Ht 5\' 8"  (1.727 m)  Wt 182 lb 9.6 oz (82.827 kg)  BMI 27.77 kg/m2  Patient's Medications  New Prescriptions   No medications on file  Previous Medications   ASPIRIN EC 81 MG  TABLET    Take 81 mg by mouth daily.   CALCIUM-VITAMIN D PO    Take 1 tablet by mouth daily at 12 noon.   CARVEDILOL (COREG) 3.125 MG TABLET    Take 1 tablet (3.125 mg total) by mouth 2 (two) times daily with a meal.   CLONAZEPAM (KLONOPIN) 0.5 MG TABLET    1 tablet by mouth twice a day as needed for anxiety, 2 by mouth every night at bedtime for insomnia   ESCITALOPRAM (LEXAPRO) 20 MG TABLET    TAKE 1 TABLET BY MOUTH ONCE DAILY   ESOMEPRAZOLE MAGNESIUM (NEXIUM 24HR PO)    Take 1 tablet by mouth daily at 12 noon.    FLUTICASONE (FLONASE) 50 MCG/ACT NASAL SPRAY    Place 1 spray into both nostrils daily as needed for allergies or rhinitis.   INSULIN ASPART (NOVOLOG) 100 UNIT/ML INJECTION     Inject 3 Units into the skin 3 (three) times daily with meals.   INSULIN GLARGINE (LANTUS) 100 UNIT/ML INJECTION    Inject 0.25 mLs (25 Units total) into the skin at bedtime.   LISINOPRIL (PRINIVIL,ZESTRIL) 5 MG TABLET    Take 1 tablet (5 mg total) by mouth 2 (two) times daily.   MULTIPLE VITAMINS-MINERALS (ONE-A-DAY MENS 50+ ADVANTAGE PO)    Take 1 tablet by mouth daily at 12 noon.   POLYETHYLENE GLYCOL POWDER (GLYCOLAX/MIRALAX) POWDER    Take 17 g by mouth at bedtime.    ROSUVASTATIN (CRESTOR) 10 MG TABLET    Take 10 mg by mouth every morning.    TAMSULOSIN (FLOMAX) 0.4 MG CAPS CAPSULE    TAKE 1 CAPSULE BY MOUTH ONCE DAILY  Modified Medications   Modified Medication Previous Medication   HYDROCODONE-ACETAMINOPHEN (NORCO/VICODIN) 5-325 MG PER TABLET HYDROcodone-acetaminophen (NORCO/VICODIN) 5-325 MG per tablet      Take 1 tablet by mouth every 6 (six) hours as needed. And 2 by mouth at 10 pm,Do Not Exceed 4 GM of tylenol in 24 hours    Take 1 tablet by mouth every 8 (eight) hours. Do Not Exceed 4 GM of tylenol in 24 hours  Discontinued Medications   ASPIRIN EC 325 MG EC TABLET    Take 1 tablet (325 mg total) by mouth daily.   DENOSUMAB (PROLIA) 60 MG/ML SOLN INJECTION    Inject 60 mg into the skin every 6 (six) months. Administer in upper arm, thigh, or abdomen     SIGNIFICANT DIAGNOSTIC EXAMS    Review of Systems  Constitutional: Negative for appetite change and fatigue.  HENT: Negative for congestion.   Respiratory: Negative for cough, chest tightness and shortness of breath.   Cardiovascular: Negative for chest pain, palpitations and leg swelling.  Gastrointestinal: Negative for nausea, abdominal pain, diarrhea and constipation.  Musculoskeletal: Negative for myalgias and arthralgias.  Skin: Negative for pallor.  Neurological: Negative for dizziness.  Psychiatric/Behavioral: The patient is not nervous/anxious.       Physical Exam  Constitutional: He is oriented to person,  place, and time. No distress.  Eyes: Conjunctivae are normal.  Neck: Neck supple. No JVD present. No thyromegaly present.  Cardiovascular: Normal rate, regular rhythm and intact distal pulses.   Respiratory: Effort normal and breath sounds normal. No respiratory distress. He has no wheezes.  GI: Soft. Bowel sounds are normal. He exhibits no distension. There is no tenderness.  Musculoskeletal: He exhibits no edema.  Able to move all extremities   Lymphadenopathy:    He has no cervical adenopathy.  Neurological: He  is alert and oriented to person, place, and time.  Skin: Skin is warm and dry. He is not diaphoretic.  Psychiatric: He has a normal mood and affect.       ASSESSMENT/ PLAN:  Will discharge him to home with outpatient therapy. He will not need dme. His prescriptions have been written for a 30 day supply of his medications with #30 vicodin 5/315 mg tabs. The facility is to setup a follow up with his pcp in the next 2 weeks.     Ok Edwards NP [facility]  Contact [phone] Monday through Friday 8am- 5pm  After hours call [phone]

## 2015-05-05 ENCOUNTER — Encounter: Payer: Self-pay | Admitting: Nurse Practitioner

## 2015-05-05 ENCOUNTER — Ambulatory Visit (INDEPENDENT_AMBULATORY_CARE_PROVIDER_SITE_OTHER): Payer: 59 | Admitting: Nurse Practitioner

## 2015-05-05 VITALS — BP 128/60 | HR 69 | Ht 68.0 in | Wt 174.0 lb

## 2015-05-05 DIAGNOSIS — I2510 Atherosclerotic heart disease of native coronary artery without angina pectoris: Secondary | ICD-10-CM

## 2015-05-05 DIAGNOSIS — I428 Other cardiomyopathies: Secondary | ICD-10-CM

## 2015-05-05 DIAGNOSIS — I429 Cardiomyopathy, unspecified: Secondary | ICD-10-CM | POA: Diagnosis not present

## 2015-05-05 DIAGNOSIS — I5181 Takotsubo syndrome: Secondary | ICD-10-CM

## 2015-05-05 DIAGNOSIS — I5022 Chronic systolic (congestive) heart failure: Secondary | ICD-10-CM

## 2015-05-05 DIAGNOSIS — E785 Hyperlipidemia, unspecified: Secondary | ICD-10-CM

## 2015-05-05 MED ORDER — CARVEDILOL 3.125 MG PO TABS: 3.1250 mg | ORAL_TABLET | Freq: Two times a day (BID) | ORAL | Status: DC

## 2015-05-05 NOTE — Progress Notes (Signed)
Patient Name: Dale Morrow Date of Encounter: 05/05/2015  Primary Care Provider:  Jonathon Bellows, MD Primary Cardiologist:  Lizbeth Bark, MD   Chief Complaint  74 year old male status post recent hospitalization related to hypoglycemia with subsequent finding of takotsubo cardiomyopathy who presents for follow-up.  Past Medical History   Past Medical History  Diagnosis Date  . Arthritis     left knee  . Depression   . De Quervain's tenosynovitis, left 04/2012  . Tremors of nervous system     takes propranolol for tremors  . Prostate cancer, primary, with metastasis from prostate to other site     a. metastasis to lymph nodes;  b. s/p XRT.  [name] Complication of anesthesia     pt denies ponv, pt woke up during colonscopy in past  . Diabetes mellitus     Insulin pump, takes lisinopril for kidneys, dr Buddy Duty endocrinologist  . Left-sided Bell's palsy   . Right-sided Bell's palsy   . Nonischemic cardiomyopathy     a. 04/2015 Echo: EF 25-30%, mid apical, antseptal, anterolat, inflat, infsept, apical AK. ? takotsubo. No thrombus. PASP 20mmHg;  b. 04/2014 Lexi MV: EF 29%, fixed mod size and intensity mid-dist ant, apical, inferior scar w/ wma's;  c. 04/2015 Cath: LM nl, LAD 50p/m, LCX 25p, RI nl, RCA nl, RPAV 50d-->Med Rx.  . Chronic systolic CHF (congestive heart failure)     a. 04/2015 Echo: EF 25-30%.   Past Surgical History  Procedure Laterality Date  . Hemorrhoid surgery  1970s  . Anterior cruciate ligament repair      right knee  . Wrist arthroscopy  05/03/2010    right; and release of 1st dorsal compartment  . Knee arthrotomy  03/18/2008    left; with scar exc.  [name] Knee arthroscopy  09/25/2007; 04/15/2005    left  . Knee joint manipulation  06/29/2007    left  . Total knee arthroplasty  04/29/2007    left  . Dorsal compartment release  04/21/2012    Procedure: RELEASE DORSAL COMPARTMENT (DEQUERVAIN);  Surgeon: Cammie Sickle., MD;  Location: [facility];  Service:  Orthopedics;  Laterality: Left;  left 1st dorsal compartment release left wrist   . Minor amputation of digit Left 07/21/2013    Procedure: LEFT SMALL REVISION AMPUTATION (MINOR PROCEDURE) ;  Surgeon: Schuyler Amor, MD;  Location: [facility];  Service: Orthopedics;  Laterality: Left;  . Colonoscopy with propofol N/A 04/07/2014    Procedure: COLONOSCOPY WITH PROPOFOL;  Surgeon: Jeryl Columbia, MD;  Location: [facility];  Service: Endoscopy;  Laterality: N/A;  . Total knee revision Left 01/25/2015    Procedure: LEFT TOTAL KNEE  ARTHOPLASTY REVISION;  Surgeon: Gaynelle Arabian, MD;  Location: [facility];  Service: Orthopedics;  Laterality: Left;  . Cardiac catheterization N/A 04/26/2015    Procedure: Left Heart Cath and Coronary Angiography;  Surgeon: Jettie Booze, MD;  Location: [facility];  Service: Cardiovascular;  Laterality: N/A;  . Cardiac catheterization  04/26/2015    Procedure: Intravascular Ultrasound/IVUS;  Surgeon: Jettie Booze, MD;  Location: [facility];  Service: Cardiovascular;;    Allergies  Allergies  Allergen Reactions  . Bee Venom Anaphylaxis  . Shellfish Allergy Anaphylaxis    HPI  74 year old male with the above past medical history. He was recently admitted to [facility] after being found to be unresponsive and hypoglycemic with a blood glucose of 32. He required intubation in the  emergency department and there was some question of seizure-like activity. He was seen by neurology with extensive workup and no significant findings. His troponin was mildly elevated and this led to an echocardiogram which showed an EF of 25-30% with multiple wall motion abnormalities possibly suggestive of a takotsubo cardiomyopathy. Stress testing was performed and this also showed LV dysfunction with an EF of 29% and fixed defects and scarring. As result, catheterization was performed on July 13 revealing moderate nonobstructive LAD disease.  Medical  therapy was recommended including beta blocker and ACE inhibitor therapy for LV dysfunction. Patient was subsequently discharged to [facility] and after 1 week there, has recovered well and was discharged today. He denies dyspnea on exertion, chest pain, PND, orthopnea, dizziness, syncope, edema, or early satiety. He has been weighing daily and if anything he has lost a few pounds because he did not like the food at the nursing home.  Home Medications  Prior to Admission medications   Medication Sig Start Date End Date Taking? Authorizing Provider  aspirin EC 81 MG tablet Take 81 mg by mouth daily.   Yes Historical Provider, MD  CALCIUM-VITAMIN D PO Take 1 tablet by mouth daily at 12 noon.   Yes Historical Provider, MD  carvedilol (COREG) 3.125 MG tablet Take 1 tablet (3.125 mg total) by mouth 2 (two) times daily with a meal. 05/05/15  Yes Rogelia Mire, NP  clonazePAM (KLONOPIN) 0.5 MG tablet 1 tablet by mouth twice a day as needed for anxiety, 2 by mouth every night at bedtime for insomnia 04/27/15  Yes Tiffany L Reed, DO  escitalopram (LEXAPRO) 20 MG tablet Take 20 mg by mouth daily.   Yes Historical Provider, MD  Esomeprazole Magnesium (NEXIUM 24HR PO) Take 1 tablet by mouth daily at 12 noon.    Yes Historical Provider, MD  fluticasone (FLONASE) 50 MCG/ACT nasal spray Place 1 spray into both nostrils daily as needed for allergies or rhinitis.   Yes Historical Provider, MD  HYDROcodone-acetaminophen (NORCO/VICODIN) 5-325 MG per tablet Take 1 tablet by mouth every 6 (six) hours as needed. And 2 by mouth at 10 pm,Do Not Exceed 4 GM of tylenol in 24 hours 05/03/15  Yes Gerlene Fee, NP  insulin aspart (NOVOLOG) 100 UNIT/ML injection Inject 3 Units into the skin 3 (three) times daily with meals. 04/25/15  Yes Orson Eva, MD  insulin glargine (LANTUS) 100 UNIT/ML injection Inject 0.25 mLs (25 Units total) into the skin at bedtime. 04/25/15  Yes Orson Eva, MD  lisinopril (PRINIVIL,ZESTRIL) 5 MG  tablet Take 1 tablet (5 mg total) by mouth 2 (two) times daily. 04/25/15  Yes Orson Eva, MD  Multiple Vitamins-Minerals (ONE-A-DAY MENS 50+ ADVANTAGE PO) Take 1 tablet by mouth daily at 12 noon.   Yes Historical Provider, MD  polyethylene glycol powder (GLYCOLAX/MIRALAX) powder Take 17 g by mouth at bedtime.    Yes Historical Provider, MD  rosuvastatin (CRESTOR) 10 MG tablet Take 10 mg by mouth every morning.    Yes Historical Provider, MD  tamsulosin (FLOMAX) 0.4 MG CAPS capsule Take 0.4 mg by mouth daily.   Yes Historical Provider, MD    Review of Systems  As above, he has been recovering well.  He denies chest pain, palpitations, dyspnea, pnd, orthopnea, n, v, dizziness, syncope, edema, weight gain, or early satiety.  All other systems reviewed and are otherwise negative except as noted above.  Physical Exam  VS:  BP 128/60 mmHg  Pulse 69  Ht 5\' 8"  (  1.727 m)  Wt 174 lb (78.926 kg)  BMI 26.46 kg/m2 , BMI Body mass index is 26.46 kg/(m^2). GEN: Well nourished, well developed, in no acute distress. HEENT: normal. Neck: Supple, no JVD, carotid bruits, or masses. Cardiac: RRR, no murmurs, rubs, or gallops. No clubbing, cyanosis, edema.  Radials/DP/PT 2+ and equal bilaterally.  Respiratory:  Respirations regular and unlabored, clear to auscultation bilaterally. GI: Soft, nontender, nondistended, BS + x 4. MS: no deformity or atrophy. Skin: warm and dry, no rash. Neuro:  Strength and sensation are intact. Psych: Normal affect.  Accessory Clinical Findings  ECG - regular sinus rhythm, 69, inferior and anterolateral T-wave inversion which is new since his admission ECG on July 6. Prolonged QT, which is unchanged.  Assessment & Plan  1.  Nonischemic cardiopathy/chronic systolic congestive heart failure/Takotsubo cardiomyopathy: Patient was recently admitted in the setting of hypoglycemia and seizure like activity with altered mental status and respiratory failure requiring intubation and  was subsequent found to have mild troponin elevation and LV dysfunction with evidence of multiple wall motion abnormalities and basal sparing. He had an abnormal stress test but nonobstructive disease on cardiac catheterization. The entire clinical picture suggests a takotsubo cardiomyopathy. He has been recovering well and was just discharged from rehabilitation today. He has not been experiencing any chest pain, dyspnea, orthopnea, or edema. He remains on beta blocker and ACE inhibitor therapy. He has been weighing daily and his weight has been stable. He is euvolemic on exam today. I will plan to repeat an echocardiogram 40 days post initial evaluation to reevaluate LV function and determine whether or not he requires electrophysiology referral.  2. Type 2 diabetes mellitus: He uses an insulin pump with sliding scale insulin. He has not had any recurrence of hypoglycemia since his discharge.   3. Hyperlipidemia: Continue statin therapy. LDL was 93 on July 13.  4. Nonobstructive coronary artery disease: Moderate nonobstructive LAD and distal RPAV disease on catheterization. He's had no chest pain. His ECG is notable for marked did T-wave inversion which was not present on his initial admission ECG on July 6. There were no repeat ECGs during his hospitalization.  Continue aspirin, beta blocker, and statin therapy.  5. Disposition: Follow-up echocardiogram in approximately 3 weeks. Follow-up with Dr. Harrington Challenger in one month or sooner if necessary.  Murray Hodgkins, NP 05/05/2015, 10:28 AM

## 2015-05-05 NOTE — Patient Instructions (Signed)
Medication Instructions:  Your physician recommends that you continue on your current medications as directed. Please refer to the Current Medication list given to you today.  A Refill Rx for Coreg has been sent to your pharmacy  Labwork: None   Testing/Procedures: Your physician has requested that you have an echocardiogram. Echocardiography is a painless test that uses sound waves to create images of your heart. It provides your doctor with information about the size and shape of your heart and how well your heart's chambers and valves are working. This procedure takes approximately one hour. There are no restrictions for this procedure.  (To be scheduled after 05/20/15)  Follow-Up: Your physician recommends that you schedule a follow-up appointment in: 1 month with Dr.Ross/ or a APP   Any Other Special Instructions Will Be Listed Below (If Applicable).

## 2015-05-12 ENCOUNTER — Encounter: Payer: Medicare Other | Admitting: Physician Assistant

## 2015-05-15 ENCOUNTER — Ambulatory Visit: Payer: 59 | Attending: Orthopedic Surgery | Admitting: Physical Therapy

## 2015-05-15 ENCOUNTER — Encounter: Payer: Self-pay | Admitting: Physical Therapy

## 2015-05-15 DIAGNOSIS — M7989 Other specified soft tissue disorders: Secondary | ICD-10-CM | POA: Insufficient documentation

## 2015-05-15 DIAGNOSIS — M25662 Stiffness of left knee, not elsewhere classified: Secondary | ICD-10-CM | POA: Diagnosis not present

## 2015-05-15 DIAGNOSIS — R29898 Other symptoms and signs involving the musculoskeletal system: Secondary | ICD-10-CM | POA: Insufficient documentation

## 2015-05-15 NOTE — Therapy (Signed)
[facility] [address] [place], [place], [address] Phone: [phone]   Fax:  [phone]  Physical Therapy Treatment  Patient Details  Name: Dale Morrow MRN: [id number] Date of Birth: [birth year] Referring Provider:  Gaynelle Arabian, MD  Encounter Date: 05/15/2015      PT End of Session - 05/15/15 0858    PT Start Time 0840   PT Stop Time 0947   PT Time Calculation (min) 67 min   Activity Tolerance Patient tolerated treatment well   Behavior During Therapy [facility] for tasks assessed/performed      Past Medical History  Diagnosis Date  . Arthritis     left knee  . Depression   . De Quervain's tenosynovitis, left 04/2012  . Tremors of nervous system     takes propranolol for tremors  . Prostate cancer, primary, with metastasis from prostate to other site     a. metastasis to lymph nodes;  b. s/p XRT.  [name] Complication of anesthesia     pt denies ponv, pt woke up during colonscopy in past  . Diabetes mellitus     Insulin pump, takes lisinopril for kidneys, dr Buddy Duty endocrinologist  . Left-sided Bell's palsy   . Right-sided Bell's palsy   . Nonischemic cardiomyopathy     a. 04/2015 Echo: EF 25-30%, mid apical, antseptal, anterolat, inflat, infsept, apical AK. ? takotsubo. No thrombus. PASP 26mmHg;  b. 04/2014 Lexi MV: EF 29%, fixed mod size and intensity mid-dist ant, apical, inferior scar w/ wma's;  c. 04/2015 Cath: LM nl, LAD 50p/m, LCX 25p, RI nl, RCA nl, RPAV 50d-->Med Rx.  . Chronic systolic CHF (congestive heart failure)     a. 04/2015 Echo: EF 25-30%.    Past Surgical History  Procedure Laterality Date  . Hemorrhoid surgery  1970s  . Anterior cruciate ligament repair      right knee  . Wrist arthroscopy  05/03/2010    right; and release of 1st dorsal compartment  . Knee arthrotomy  03/18/2008    left; with scar exc.  [name] Knee arthroscopy  09/25/2007; 04/15/2005    left  . Knee joint manipulation  06/29/2007   left  . Total knee arthroplasty  04/29/2007    left  . Dorsal compartment release  04/21/2012    Procedure: RELEASE DORSAL COMPARTMENT (DEQUERVAIN);  Surgeon: Cammie Sickle., MD;  Location: [facility];  Service: Orthopedics;  Laterality: Left;  left 1st dorsal compartment release left wrist   . Minor amputation of digit Left 07/21/2013    Procedure: LEFT SMALL REVISION AMPUTATION (MINOR PROCEDURE) ;  Surgeon: Schuyler Amor, MD;  Location: [place];  Service: Orthopedics;  Laterality: Left;  . Colonoscopy with propofol N/A 04/07/2014    Procedure: COLONOSCOPY WITH PROPOFOL;  Surgeon: Jeryl Columbia, MD;  Location: [facility];  Service: Endoscopy;  Laterality: N/A;  . Total knee revision Left 01/25/2015    Procedure: LEFT TOTAL KNEE  ARTHOPLASTY REVISION;  Surgeon: Gaynelle Arabian, MD;  Location: [facility];  Service: Orthopedics;  Laterality: Left;  . Cardiac catheterization N/A 04/26/2015    Procedure: Left Heart Cath and Coronary Angiography;  Surgeon: Jettie Booze, MD;  Location: [place] [facility];  Service: Cardiovascular;  Laterality: N/A;  . Cardiac catheterization  04/26/2015    Procedure: Intravascular Ultrasound/IVUS;  Surgeon: Jettie Booze, MD;  Location: [place] [facility];  Service: Cardiovascular;;    There were no vitals filed for  this visit.  Visit Diagnosis:  Knee stiffness, left  Weakness of left lower extremity  Swelling of limb      Subjective Assessment - 05/15/15 0848    Subjective Pt reports after prolonged sitting left knee feels stiff and he notices the decreased ROM, just started to negotiate the stairs with step over step    How long can you sit comfortably? Not limited    How long can you stand comfortably? 5 minutes before pain starts    How long can you walk comfortably? 10 minutes     Patient Stated Goals Get ROM back, ride mountain bike, working out at [employer] (ellipitical, 1 hour cardio) weight program     Currently in Pain? Yes   Pain Score 3    Pain Location Knee   Pain Orientation Left   Pain Descriptors / Indicators Aching   Pain Type Chronic pain   Pain Onset More than a month ago   Pain Frequency Intermittent   Aggravating Factors  Bending   Pain Relieving Factors ice, meds   Multiple Pain Sites No            [facility] PT Assessment - 05/15/15 0001    Assessment   Medical Diagnosis Z96.652 - Status post revision of total replacement of the knee    Onset Date/Surgical Date 01/26/15   Observation/Other Assessments   Focus on Therapeutic Outcomes (FOTO)  49% limitation                     [facility] Adult PT Treatment/Exercise - 05/15/15 0001    Lumbar Exercises: Machines for Strengthening   Leg Press ST #6 80# 3x 10, 40# Lt leg only   Knee/Hip Exercises: Aerobic   Stationary Bike Full fwd ROM L1 x10 min   Knee/Hip Exercises: Standing   Heel Raises 2 sets;10 reps   SLS 3 x 20 sec on Lt knee  modified at times to maintain balance   Rebounder 3way shift x 1 min each  without UE support   Other Standing Knee Exercises Curls 5# added with Left LE 2x10                  PT Short Term Goals - 05/15/15 4970    PT SHORT TERM GOAL #1   Title Independent with HEP    Time 4   Period Weeks   Status Achieved   PT SHORT TERM GOAL #2   Title Improve Lt knee flexion AROM to 95 degrees to improve ability to do elliptical    Time 4   Period Weeks   Status Achieved   PT SHORT TERM GOAL #3   Title Improve Lt knee extension AROM to -5 for fluid gait pattern    Time 4   Period Weeks   Status Achieved           PT Long Term Goals - 05/15/15 2637    PT LONG TERM GOAL #1   Title Be independent with advanced HEP    Time 8   Period Weeks   Status On-going   PT LONG TERM GOAL #2   Title Improve FOTO disabiltiy to < or = to 43%  As of Aug 1st: 49% limitations   Time 8   Period Weeks   Status On-going   PT LONG TERM GOAL #3   Title Increase LE strength to  return to 1 hour cardio workout on elliptical    Time 8   Status  On-going   PT LONG TERM GOAL #4   Title Improve knee flexion AROM to 105 in order to use riding lawnmower   As of 05/29/23 100 degrees of flexion   Time 8   Period Weeks   Status On-going   PT LONG TERM GOAL #5   Title wean from the cane to no device for community distances > or = to 75% of the time   Time 8   Period Weeks   Status On-going               Plan - 29-May-2015 0913    Clinical Impression Statement Pt continues to improve with ambulation and negotiating stairs, but all over endurance is still limited and decreased AROM into flexion left knee   Pt will benefit from skilled therapeutic intervention in order to improve on the following deficits Decreased range of motion;Difficulty walking;Decreased endurance;Decreased activity tolerance;Pain;Decreased scar mobility;Increased edema;Decreased strength   Rehab Potential Good   PT Frequency 3x / week   PT Duration 8 weeks   PT Treatment/Interventions Moist Heat;Ultrasound;Vasopneumatic Device;Cryotherapy;Electrical Stimulation;Gait training;Functional mobility training;Therapeutic exercise;Neuromuscular re-education;Patient/family education;Passive range of motion;Energy conservation;Visual/perceptual remediation/compensation   PT Next Visit Plan Continue with Left LE strength with focus on gastrocnemius and hamstrings, improve ROM into flexion   PT Home Exercise Plan current HEP   Consulted and Agree with Plan of Care Patient          G-Codes - May 29, 2015 0919    Functional Assessment Tool Used FOTO: 49% limitation   Functional Limitation Mobility: Walking and moving around   Mobility: Walking and Moving Around Current Status [phone]) At least 40 percent but less than 60 percent impaired, limited or restricted   Mobility: Walking and Moving Around Goal Status [phone]) At least 40 percent but less than 60 percent impaired, limited or restricted      Problem  List Patient Active Problem List   Diagnosis Date Noted  . Chronic systolic CHF (congestive heart failure)   . Nonischemic cardiomyopathy   . Type II diabetes mellitus   . Cardiomyopathy   . Aspiration pneumonia 04/22/2015  . Elevated troponin 04/21/2015  . Altered mental status   . Status epilepticus 04/19/2015  . Altered consciousness   . Acute respiratory failure   . Seizure-like activity   . Acute encephalopathy   . Failed total knee arthroplasty 01/25/2015  . OA (osteoarthritis) of knee 01/25/2015  . Essential tremor 03/09/2014  . Facial spasm 04/14/2013  . Arthritis   . Depression   . Hypertension   . Prostate cancer, primary, with metastasis from prostate to other site   . PONV (postoperative nausea and vomiting)   . De Quervain's tenosynovitis, left 04/13/2012     Gradie Butrick Naumann-Houegnifio, PTA 05/29/15 9:41 AM  [facility] [address] [place], [place], [address] Phone: [phone]   Fax:  [phone]

## 2015-05-15 NOTE — Therapy (Addendum)
[facility] [address] [place], [place], [address] Phone: [phone]   Fax:  [phone]  Physical Therapy Treatment  Patient Details  Name: Dale Morrow MRN: [id number] Date of Birth: 1941-08-15 Referring Provider:  Gaynelle Arabian, MD  Encounter Date: 05/15/2015      PT End of Session - 05/15/15 0858    PT Start Time 0840   PT Stop Time 0947   PT Time Calculation (min) 67 min   Activity Tolerance Patient tolerated treatment well   Behavior During Therapy [facility] for tasks assessed/performed      Past Medical History  Diagnosis Date  . Arthritis     left knee  . Depression   . De Quervain's tenosynovitis, left 04/2012  . Tremors of nervous system     takes propranolol for tremors  . Prostate cancer, primary, with metastasis from prostate to other site     a. metastasis to lymph nodes;  b. s/p XRT.  [name] Complication of anesthesia     pt denies ponv, pt woke up during colonscopy in past  . Diabetes mellitus     Insulin pump, takes lisinopril for kidneys, dr Buddy Duty endocrinologist  . Left-sided Bell's palsy   . Right-sided Bell's palsy   . Nonischemic cardiomyopathy     a. 04/2015 Echo: EF 25-30%, mid apical, antseptal, anterolat, inflat, infsept, apical AK. ? takotsubo. No thrombus. PASP 57mmHg;  b. 04/2014 Lexi MV: EF 29%, fixed mod size and intensity mid-dist ant, apical, inferior scar w/ wma's;  c. 04/2015 Cath: LM nl, LAD 50p/m, LCX 25p, RI nl, RCA nl, RPAV 50d-->Med Rx.  . Chronic systolic CHF (congestive heart failure)     a. 04/2015 Echo: EF 25-30%.    Past Surgical History  Procedure Laterality Date  . Hemorrhoid surgery  1970s  . Anterior cruciate ligament repair      right knee  . Wrist arthroscopy  05/03/2010    right; and release of 1st dorsal compartment  . Knee arthrotomy  03/18/2008    left; with scar exc.  [name] Knee arthroscopy  09/25/2007; 04/15/2005    left  . Knee joint manipulation  06/29/2007     left  . Total knee arthroplasty  04/29/2007    left  . Dorsal compartment release  04/21/2012    Procedure: RELEASE DORSAL COMPARTMENT (DEQUERVAIN);  Surgeon: Cammie Sickle., MD;  Location: [facility];  Service: Orthopedics;  Laterality: Left;  left 1st dorsal compartment release left wrist   . Minor amputation of digit Left 07/21/2013    Procedure: LEFT SMALL REVISION AMPUTATION (MINOR PROCEDURE) ;  Surgeon: Schuyler Amor, MD;  Location: [place];  Service: Orthopedics;  Laterality: Left;  . Colonoscopy with propofol N/A 04/07/2014    Procedure: COLONOSCOPY WITH PROPOFOL;  Surgeon: Jeryl Columbia, MD;  Location: [facility];  Service: Endoscopy;  Laterality: N/A;  . Total knee revision Left 01/25/2015    Procedure: LEFT TOTAL KNEE  ARTHOPLASTY REVISION;  Surgeon: Gaynelle Arabian, MD;  Location: [facility];  Service: Orthopedics;  Laterality: Left;  . Cardiac catheterization N/A 04/26/2015    Procedure: Left Heart Cath and Coronary Angiography;  Surgeon: Jettie Booze, MD;  Location: [place] [facility];  Service: Cardiovascular;  Laterality: N/A;  . Cardiac catheterization  04/26/2015    Procedure: Intravascular Ultrasound/IVUS;  Surgeon: Jettie Booze, MD;  Location: [place] [facility];  Service: Cardiovascular;;    There were no vitals  filed for this visit.  Visit Diagnosis:  Knee stiffness, left  Weakness of left lower extremity  Swelling of limb      Subjective Assessment - 05/15/15 0848    Subjective Pt reports after prolonged sitting left knee feels stiff and he notices the decreased ROM, just started to negotiate the stairs with step over step    How long can you sit comfortably? Not limited    How long can you stand comfortably? 5 minutes before pain starts    How long can you walk comfortably? 10 minutes     Patient Stated Goals Get ROM back, ride mountain bike, working out at [employer] (ellipitical, 1 hour cardio) weight program     Currently in Pain? Yes   Pain Score 3    Pain Location Knee   Pain Orientation Left   Pain Descriptors / Indicators Aching   Pain Type Chronic pain   Pain Onset More than a month ago   Pain Frequency Intermittent   Aggravating Factors  Bending   Pain Relieving Factors ice, meds   Multiple Pain Sites No            [facility] PT Assessment - 05/15/15 0001    Assessment   Medical Diagnosis Z96.652 - Status post revision of total replacement of the knee    Onset Date/Surgical Date 01/26/15   Observation/Other Assessments   Focus on Therapeutic Outcomes (FOTO)  49% limitation   AROM   Overall AROM  Deficits   Overall AROM Comments 100 degrees flexion, 0 degrees extension                     [facility] Adult PT Treatment/Exercise - 05/15/15 0001    Lumbar Exercises: Machines for Strengthening   Leg Press ST #6 80# 3x 10, 40# Lt leg only   Knee/Hip Exercises: Aerobic   Stationary Bike Full fwd ROM L1 x10 min   Knee/Hip Exercises: Standing   Heel Raises 2 sets;10 reps   SLS 3 x 20 sec on Lt knee  modified at times to maintain balance   Rebounder 3way shift x 1 min each  without UE support   Other Standing Knee Exercises Curls 5# added with Left LE 2x10                  PT Short Term Goals - 05/15/15 5009    PT SHORT TERM GOAL #1   Title Independent with HEP    Time 4   Period Weeks   Status Achieved   PT SHORT TERM GOAL #2   Title Improve Lt knee flexion AROM to 95 degrees to improve ability to do elliptical    Time 4   Period Weeks   Status Achieved   PT SHORT TERM GOAL #3   Title Improve Lt knee extension AROM to -5 for fluid gait pattern    Time 4   Period Weeks   Status Achieved           PT Long Term Goals - 05/15/15 3818    PT LONG TERM GOAL #1   Title Be independent with advanced HEP    Time 8   Period Weeks   Status On-going   PT LONG TERM GOAL #2   Title Improve FOTO disabiltiy to < or = to 43%  As of Aug 1st: 49% limitations    Time 8   Period Weeks   Status On-going   PT LONG TERM GOAL #3  Title Increase LE strength to return to 1 hour cardio workout on elliptical    Time 8   Status On-going   PT LONG TERM GOAL #4   Title Improve knee flexion AROM to 105 in order to use riding lawnmower   As of 08-Jun-2023 100 degrees of flexion   Time 8   Period Weeks   Status On-going   PT LONG TERM GOAL #5   Title wean from the cane to no device for community distances > or = to 75% of the time   Time 8   Period Weeks   Status On-going               Plan - 2015/06/08 0913    Clinical Impression Statement Pt continues to improve with ambulation and negotiating stairs, but all over endurance is still limited and decreased AROM into flexion left knee   Pt will benefit from skilled therapeutic intervention in order to improve on the following deficits Decreased range of motion;Difficulty walking;Decreased endurance;Decreased activity tolerance;Pain;Decreased scar mobility;Increased edema;Decreased strength   Rehab Potential Good   PT Frequency 3x / week   PT Duration 8 weeks   PT Treatment/Interventions Moist Heat;Ultrasound;Vasopneumatic Device;Cryotherapy;Electrical Stimulation;Gait training;Functional mobility training;Therapeutic exercise;Neuromuscular re-education;Patient/family education;Passive range of motion;Energy conservation;Visual/perceptual remediation/compensation   PT Next Visit Plan Continue with Left LE strength with focus on gastrocnemius and hamstrings, improve ROM into flexion   PT Home Exercise Plan current HEP   Consulted and Agree with Plan of Care Patient          G-Codes - June 08, 2015 0919    Functional Assessment Tool Used FOTO: 49% limitation   Functional Limitation Mobility: Walking and moving around   Mobility: Walking and Moving Around Current Status [phone]) At least 40 percent but less than 60 percent impaired, limited or restricted   Mobility: Walking and Moving Around Goal Status  [phone]) At least 40 percent but less than 60 percent impaired, limited or restricted      Problem List Patient Active Problem List   Diagnosis Date Noted  . Chronic systolic CHF (congestive heart failure)   . Nonischemic cardiomyopathy   . Type II diabetes mellitus   . Cardiomyopathy   . Aspiration pneumonia 04/22/2015  . Elevated troponin 04/21/2015  . Altered mental status   . Status epilepticus 04/19/2015  . Altered consciousness   . Acute respiratory failure   . Seizure-like activity   . Acute encephalopathy   . Failed total knee arthroplasty 01/25/2015  . OA (osteoarthritis) of knee 01/25/2015  . Essential tremor 03/09/2014  . Facial spasm 04/14/2013  . Arthritis   . Depression   . Hypertension   . Prostate cancer, primary, with metastasis from prostate to other site   . PONV (postoperative nausea and vomiting)   . De Quervain's tenosynovitis, left 04/13/2012  Elke Naumann-Houegnifio, PTA 06/08/2015 11:27 AM  Physical Therapy Progress Note  Dates of Reporting Period: 04/14/15 to 2015-06-08  Objective Reports of Subjective Statement: See above.  Pt reports that therapy has been helping to improve Lt knee strength and improve mobility.   Objective Measurements: see above 0-100 degrees of Lt knee AROM.  FOTO 49% limitation  Goal Update: See above for most current goal status.  Plan: Continue PT for Lt knee strength, endurance and gait training to wean from cane safely.  Pt missed ~4 weeks of PT due to hospitalization and resumed PT today.    Reason Skilled Services are Required: Pt with continued Lt knee functional weakness  and AROM deficits that limit endurance and function.  Pt will benefit from skilled PT to improve endurance, strength and gait.    Sigurd Sos, PT 05/15/2015 9:41 AM   [facility] [address] [place], [place], [address] Phone: [phone]   Fax:  [phone]

## 2015-05-17 ENCOUNTER — Ambulatory Visit: Payer: 59 | Admitting: Physical Therapy

## 2015-05-17 DIAGNOSIS — M25662 Stiffness of left knee, not elsewhere classified: Secondary | ICD-10-CM | POA: Diagnosis not present

## 2015-05-17 DIAGNOSIS — R29898 Other symptoms and signs involving the musculoskeletal system: Secondary | ICD-10-CM

## 2015-05-17 DIAGNOSIS — M7989 Other specified soft tissue disorders: Secondary | ICD-10-CM

## 2015-05-17 NOTE — Therapy (Signed)
[facility] [address], [place], [place], [address] Phone: [phone]   Fax:  [phone]  Physical Therapy Treatment  Patient Details  Name: Dale Morrow MRN: [id number] Date of Birth: Feb 04, 1941 Referring Provider:  Gaynelle Arabian, MD  Encounter Date: 05/17/2015      PT End of Session - 05/17/15 0921    Visit Number 11   Number of Visits 20   Date for PT Re-Evaluation 05/23/15   PT Start Time 0829   Activity Tolerance Patient tolerated treatment well   Behavior During Therapy [facility] for tasks assessed/performed      Past Medical History  Diagnosis Date  . Arthritis     left knee  . Depression   . De Quervain's tenosynovitis, left 04/2012  . Tremors of nervous system     takes propranolol for tremors  . Prostate cancer, primary, with metastasis from prostate to other site     a. metastasis to lymph nodes;  b. s/p XRT.  [name] Complication of anesthesia     pt denies ponv, pt woke up during colonscopy in past  . Diabetes mellitus     Insulin pump, takes lisinopril for kidneys, dr Buddy Duty endocrinologist  . Left-sided Bell's palsy   . Right-sided Bell's palsy   . Nonischemic cardiomyopathy     a. 04/2015 Echo: EF 25-30%, mid apical, antseptal, anterolat, inflat, infsept, apical AK. ? takotsubo. No thrombus. PASP 58mmHg;  b. 04/2014 Lexi MV: EF 29%, fixed mod size and intensity mid-dist ant, apical, inferior scar w/ wma's;  c. 04/2015 Cath: LM nl, LAD 50p/m, LCX 25p, RI nl, RCA nl, RPAV 50d-->Med Rx.  . Chronic systolic CHF (congestive heart failure)     a. 04/2015 Echo: EF 25-30%.    Past Surgical History  Procedure Laterality Date  . Hemorrhoid surgery  1970s  . Anterior cruciate ligament repair      right knee  . Wrist arthroscopy  05/03/2010    right; and release of 1st dorsal compartment  . Knee arthrotomy  03/18/2008    left; with scar exc.  [name] Knee arthroscopy  09/25/2007; 04/15/2005    left  . Knee joint  manipulation  06/29/2007    left  . Total knee arthroplasty  04/29/2007    left  . Dorsal compartment release  04/21/2012    Procedure: RELEASE DORSAL COMPARTMENT (DEQUERVAIN);  Surgeon: Cammie Sickle., MD;  Location[facility];  Service: Orthopedics;  Laterality: Left;  left 1st dorsal compartment release left wrist   . Minor amputation of digit Left 07/21/2013    Procedure: LEFT SMALL REVISION AMPUTATION (MINOR PROCEDURE) ;  Surgeon: Schuyler Amor, MD;  Location: [facility];  Service: Orthopedics;  Laterality: Left;  . Colonoscopy with propofol N/A 04/07/2014    Procedure: COLONOSCOPY WITH PROPOFOL;  Surgeon: Jeryl Columbia, MD;  Location: [facility];  Service: Endoscopy;  Laterality: N/A;  . Total knee revision Left 01/25/2015    Procedure: LEFT TOTAL KNEE  ARTHOPLASTY REVISION;  Surgeon: Gaynelle Arabian, MD;  Location: [facility];  Service: Orthopedics;  Laterality: Left;  . Cardiac catheterization N/A 04/26/2015    Procedure: Left Heart Cath and Coronary Angiography;  Surgeon: Jettie Booze, MD;  Location: [place];  Service: Cardiovascular;  Laterality: N/A;  . Cardiac catheterization  04/26/2015    Procedure: Intravascular Ultrasound/IVUS;  Surgeon: Jettie Booze, MD;  Location: [facility] [facility];  Service: Cardiovascular;;  There were no vitals filed for this visit.  Visit Diagnosis:  Knee stiffness, left  Weakness of left lower extremity  Swelling of limb                       [facility] Adult PT Treatment/Exercise - 05/17/15 0001    Lumbar Exercises: Machines for Strengthening   Leg Press ST #6 80# 3x 10, 40# Lt leg only   Other Lumbar Machine Exercise Leg press 25# for Lt calf strengthening 3x10   Knee/Hip Exercises: Aerobic   Stationary Bike Full fwd ROM L1 x20 min  pt arrived early to start with the bike   Knee/Hip Exercises: Standing   Heel Raises 3 sets;10 reps   Lateral Step Up Left;20 reps;Hand Hold:  1   Forward Step Up Left;20 reps;Hand Hold: 1   SLS 3 x 20 sec on Lt knee  3rd rep needs to modifie   Rebounder 3way shift x 1 min each   Walking with Sports Cord 25# forward/reverse backward/reverse x10   Vasopneumatic   Number Minutes Vasopneumatic  15 minutes   Vasopnuematic Location  Knee   Vasopneumatic Pressure High   Vasopneumatic Temperature  3 snowflakes                  PT Short Term Goals - 05/15/15 8416    PT SHORT TERM GOAL #1   Title Independent with HEP    Time 4   Period Weeks   Status Achieved   PT SHORT TERM GOAL #2   Title Improve Lt knee flexion AROM to 95 degrees to improve ability to do elliptical    Time 4   Period Weeks   Status Achieved   PT SHORT TERM GOAL #3   Title Improve Lt knee extension AROM to -5 for fluid gait pattern    Time 4   Period Weeks   Status Achieved           PT Long Term Goals - 05/15/15 6063    PT LONG TERM GOAL #1   Title Be independent with advanced HEP    Time 8   Period Weeks   Status On-going   PT LONG TERM GOAL #2   Title Improve FOTO disabiltiy to < or = to 43%  As of Aug 1st: 49% limitations   Time 8   Period Weeks   Status On-going   PT LONG TERM GOAL #3   Title Increase LE strength to return to 1 hour cardio workout on elliptical    Time 8   Status On-going   PT LONG TERM GOAL #4   Title Improve knee flexion AROM to 105 in order to use riding lawnmower   As of Aug 1st 100 degrees of flexion   Time 8   Period Weeks   Status On-going   PT LONG TERM GOAL #5   Title wean from the cane to no device for community distances > or = to 75% of the time   Time 8   Period Weeks   Status On-going               Plan - 05/17/15 0160    Clinical Impression Statement Pt determined and with good participation to incr activity level today, but strength and decreased ROM left kneel AROM left knee 100degrees taken at stairs. Pt will continue to benefit from  skilled PT    Pt will benefit from  skilled therapeutic intervention in order  to improve on the following deficits Decreased range of motion;Difficulty walking;Decreased endurance;Decreased activity tolerance;Pain;Decreased scar mobility;Increased edema;Decreased strength   PT Frequency 3x / week   PT Duration 8 weeks   PT Treatment/Interventions Moist Heat;Ultrasound;Vasopneumatic Device;Cryotherapy;Electrical Stimulation;Gait training;Functional mobility training;Therapeutic exercise;Neuromuscular re-education;Patient/family education;Passive range of motion;Energy conservation;Visual/perceptual remediation/compensation   PT Next Visit Plan Continue with Left LE strength with focus on gastrocnemius and hamstrings, improve ROM into flexion   PT Home Exercise Plan current HEP   Consulted and Agree with Plan of Care Patient        Problem List Patient Active Problem List   Diagnosis Date Noted  . Chronic systolic CHF (congestive heart failure)   . Nonischemic cardiomyopathy   . Type II diabetes mellitus   . Cardiomyopathy   . Aspiration pneumonia 04/22/2015  . Elevated troponin 04/21/2015  . Altered mental status   . Status epilepticus 04/19/2015  . Altered consciousness   . Acute respiratory failure   . Seizure-like activity   . Acute encephalopathy   . Failed total knee arthroplasty 01/25/2015  . OA (osteoarthritis) of knee 01/25/2015  . Essential tremor 03/09/2014  . Facial spasm 04/14/2013  . Arthritis   . Depression   . Hypertension   . Prostate cancer, primary, with metastasis from prostate to other site   . PONV (postoperative nausea and vomiting)   . De Quervain's tenosynovitis, left 04/13/2012    NAUMANN-HOUEGNIFIO,Alondria Mousseau PTA 05/17/2015, 9:30 AM  [facility] [address] [place], [place], [address] Phone: [phone]   Fax:  [phone]

## 2015-05-17 NOTE — Therapy (Signed)
[facility] [address], [place], [place], [address] Phone: [phone]   Fax:  [phone]  Physical Therapy Treatment  Patient Details  Name: Dale Morrow MRN: [id number] Date of Birth: 08-03-1941 Referring Provider:  Gaynelle Arabian, MD  Encounter Date: 05/15/2015      PT End of Session - 05/17/15 0921    Visit Number 11   Number of Visits 20   Date for PT Re-Evaluation 05/23/15   PT Start Time 0829   Activity Tolerance Patient tolerated treatment well   Behavior During Therapy [facility] for tasks assessed/performed      Past Medical History  Diagnosis Date  . Arthritis     left knee  . Depression   . De Quervain's tenosynovitis, left 04/2012  . Tremors of nervous system     takes propranolol for tremors  . Prostate cancer, primary, with metastasis from prostate to other site     a. metastasis to lymph nodes;  b. s/p XRT.  [name] Complication of anesthesia     pt denies ponv, pt woke up during colonscopy in past  . Diabetes mellitus     Insulin pump, takes lisinopril for kidneys, dr Buddy Duty endocrinologist  . Left-sided Bell's palsy   . Right-sided Bell's palsy   . Nonischemic cardiomyopathy     a. 04/2015 Echo: EF 25-30%, mid apical, antseptal, anterolat, inflat, infsept, apical AK. ? takotsubo. No thrombus. PASP 104mmHg;  b. 04/2014 Lexi MV: EF 29%, fixed mod size and intensity mid-dist ant, apical, inferior scar w/ wma's;  c. 04/2015 Cath: LM nl, LAD 50p/m, LCX 25p, RI nl, RCA nl, RPAV 50d-->Med Rx.  . Chronic systolic CHF (congestive heart failure)     a. 04/2015 Echo: EF 25-30%.    Past Surgical History  Procedure Laterality Date  . Hemorrhoid surgery  1970s  . Anterior cruciate ligament repair      right knee  . Wrist arthroscopy  05/03/2010    right; and release of 1st dorsal compartment  . Knee arthrotomy  03/18/2008    left; with scar exc.  [name] Knee arthroscopy  09/25/2007; 04/15/2005    left  . Knee joint  manipulation  06/29/2007    left  . Total knee arthroplasty  04/29/2007    left  . Dorsal compartment release  04/21/2012    Procedure: RELEASE DORSAL COMPARTMENT (DEQUERVAIN);  Surgeon: Cammie Sickle., MD;  Location: [facility];  Service: Orthopedics;  Laterality: Left;  left 1st dorsal compartment release left wrist   . Minor amputation of digit Left 07/21/2013    Procedure: LEFT SMALL REVISION AMPUTATION (MINOR PROCEDURE) ;  Surgeon: Schuyler Amor, MD;  Location: [facility];  Service: Orthopedics;  Laterality: Left;  . Colonoscopy with propofol N/A 04/07/2014    Procedure: COLONOSCOPY WITH PROPOFOL;  Surgeon: Jeryl Columbia, MD;  [facility];  Service: Endoscopy;  Laterality: N/A;  . Total knee revision Left 01/25/2015    Procedure: LEFT TOTAL KNEE  ARTHOPLASTY REVISION;  Surgeon: Gaynelle Arabian, MD;  [facility];  Service: Orthopedics;  Laterality: Left;  . Cardiac catheterization N/A 04/26/2015    Procedure: Left Heart Cath and Coronary Angiography;  Surgeon: Jettie Booze, MD;  Location: [place];  Service: Cardiovascular;  Laterality: N/A;  . Cardiac catheterization  04/26/2015    Procedure: Intravascular Ultrasound/IVUS;  Surgeon: Jettie Booze, MD;  Location: [place];  Service: Cardiovascular;;  There were no vitals filed for this visit.  Visit Diagnosis:  Knee stiffness, left  Weakness of left lower extremity  Swelling of limb                       [facility] Adult PT Treatment/Exercise - 05/17/15 0001    Lumbar Exercises: Machines for Strengthening   Leg Press ST #6 80# 3x 10, 40# Lt leg only   Other Lumbar Machine Exercise Leg press 25# for Lt calf strengthening 3x10   Knee/Hip Exercises: Aerobic   Stationary Bike Full fwd ROM L1 x20 min  pt arrived early to start with the bike   Knee/Hip Exercises: Standing   Heel Raises 3 sets;10 reps   Lateral Step Up Left;20 reps;Hand Hold:  1   Forward Step Up Left;20 reps;Hand Hold: 1   SLS 3 x 20 sec on Lt knee  3rd rep needs to modifie   Rebounder 3way shift x 1 min each   Walking with Sports Cord 25# forward/reverse backward/reverse x10   Vasopneumatic   Number Minutes Vasopneumatic  15 minutes   Vasopnuematic Location  Knee   Vasopneumatic Pressure High   Vasopneumatic Temperature  3 snowflakes                  PT Short Term Goals - 05/15/15 1194    PT SHORT TERM GOAL #1   Title Independent with HEP    Time 4   Period Weeks   Status Achieved   PT SHORT TERM GOAL #2   Title Improve Lt knee flexion AROM to 95 degrees to improve ability to do elliptical    Time 4   Period Weeks   Status Achieved   PT SHORT TERM GOAL #3   Title Improve Lt knee extension AROM to -5 for fluid gait pattern    Time 4   Period Weeks   Status Achieved           PT Long Term Goals - 05/15/15 1740    PT LONG TERM GOAL #1   Title Be independent with advanced HEP    Time 8   Period Weeks   Status On-going   PT LONG TERM GOAL #2   Title Improve FOTO disabiltiy to < or = to 43%  As of Aug 1st: 49% limitations   Time 8   Period Weeks   Status On-going   PT LONG TERM GOAL #3   Title Increase LE strength to return to 1 hour cardio workout on elliptical    Time 8   Status On-going   PT LONG TERM GOAL #4   Title Improve knee flexion AROM to 105 in order to use riding lawnmower   As of Aug 1st 100 degrees of flexion   Time 8   Period Weeks   Status On-going   PT LONG TERM GOAL #5   Title wean from the cane to no device for community distances > or = to 75% of the time   Time 8   Period Weeks   Status On-going               Plan - 05/17/15 8144    Clinical Impression Statement Pt determined and with good participation to incr activity level today, but strength and decreased ROM left kneel AROM left knee 100degrees taken at stairs. Pt will continue to benefit from  skilled PT    Pt will benefit from  skilled therapeutic intervention in order  to improve on the following deficits Decreased range of motion;Difficulty walking;Decreased endurance;Decreased activity tolerance;Pain;Decreased scar mobility;Increased edema;Decreased strength   PT Frequency 3x / week   PT Duration 8 weeks   PT Treatment/Interventions Moist Heat;Ultrasound;Vasopneumatic Device;Cryotherapy;Electrical Stimulation;Gait training;Functional mobility training;Therapeutic exercise;Neuromuscular re-education;Patient/family education;Passive range of motion;Energy conservation;Visual/perceptual remediation/compensation   PT Next Visit Plan Continue with Left LE strength with focus on gastrocnemius and hamstrings, improve ROM into flexion   PT Home Exercise Plan current HEP   Consulted and Agree with Plan of Care Patient        Problem List Patient Active Problem List   Diagnosis Date Noted  . Chronic systolic CHF (congestive heart failure)   . Nonischemic cardiomyopathy   . Type II diabetes mellitus   . Cardiomyopathy   . Aspiration pneumonia 04/22/2015  . Elevated troponin 04/21/2015  . Altered mental status   . Status epilepticus 04/19/2015  . Altered consciousness   . Acute respiratory failure   . Seizure-like activity   . Acute encephalopathy   . Failed total knee arthroplasty 01/25/2015  . OA (osteoarthritis) of knee 01/25/2015  . Essential tremor 03/09/2014  . Facial spasm 04/14/2013  . Arthritis   . Depression   . Hypertension   . Prostate cancer, primary, with metastasis from prostate to other site   . PONV (postoperative nausea and vomiting)   . De Quervain's tenosynovitis, left 04/13/2012    NAUMANN-HOUEGNIFIO,Shaindel Sweeten PTA 05/17/2015, 10:09 AM  [facility] [address] [place], [place], [address] Phone: [phone]   Fax:  [phone]

## 2015-05-22 ENCOUNTER — Ambulatory Visit: Payer: 59 | Admitting: Physical Therapy

## 2015-05-22 ENCOUNTER — Encounter: Payer: Self-pay | Admitting: Physical Therapy

## 2015-05-22 DIAGNOSIS — R29898 Other symptoms and signs involving the musculoskeletal system: Secondary | ICD-10-CM

## 2015-05-22 DIAGNOSIS — M25662 Stiffness of left knee, not elsewhere classified: Secondary | ICD-10-CM

## 2015-05-22 DIAGNOSIS — M7989 Other specified soft tissue disorders: Secondary | ICD-10-CM

## 2015-05-22 NOTE — Therapy (Addendum)
[facility] [address] [place], [place], [address] Phone: [phone]   Fax:  [phone]  Physical Therapy Treatment  Patient Details  Name: Dale Morrow MRN: [id number] Date of Birth: 1941/08/27 Referring Provider:  Gaynelle Arabian, MD  Encounter Date: 05/22/2015      PT End of Session - 05/22/15 1426    Visit Number 12   Number of Visits 20   Date for PT Re-Evaluation 07/03/15   PT Start Time 4315   PT Stop Time 1500   PT Time Calculation (min) 61 min   Activity Tolerance Patient tolerated treatment well   Behavior During Therapy [facility] for tasks assessed/performed      Past Medical History  Diagnosis Date  . Arthritis     left knee  . Depression   . De Quervain's tenosynovitis, left 04/2012  . Tremors of nervous system     takes propranolol for tremors  . Prostate cancer, primary, with metastasis from prostate to other site     a. metastasis to lymph nodes;  b. s/p XRT.  [name] Complication of anesthesia     pt denies ponv, pt woke up during colonscopy in past  . Diabetes mellitus     Insulin pump, takes lisinopril for kidneys, dr Buddy Duty endocrinologist  . Left-sided Bell's palsy   . Right-sided Bell's palsy   . Nonischemic cardiomyopathy     a. 04/2015 Echo: EF 25-30%, mid apical, antseptal, anterolat, inflat, infsept, apical AK. ? takotsubo. No thrombus. PASP 62mmHg;  b. 04/2014 Lexi MV: EF 29%, fixed mod size and intensity mid-dist ant, apical, inferior scar w/ wma's;  c. 04/2015 Cath: LM nl, LAD 50p/m, LCX 25p, RI nl, RCA nl, RPAV 50d-->Med Rx.  . Chronic systolic CHF (congestive heart failure)     a. 04/2015 Echo: EF 25-30%.    Past Surgical History  Procedure Laterality Date  . Hemorrhoid surgery  1970s  . Anterior cruciate ligament repair      right knee  . Wrist arthroscopy  05/03/2010    right; and release of 1st dorsal compartment  . Knee arthrotomy  03/18/2008    left; with scar exc.  [name] Knee  arthroscopy  09/25/2007; 04/15/2005    left  . Knee joint manipulation  06/29/2007    left  . Total knee arthroplasty  04/29/2007    left  . Dorsal compartment release  04/21/2012    Procedure: RELEASE DORSAL COMPARTMENT (DEQUERVAIN);  Surgeon: Cammie Sickle., MD;  Location: [facility];  Service: Orthopedics;  Laterality: Left;  left 1st dorsal compartment release left wrist   . Minor amputation of digit Left 07/21/2013    Procedure: LEFT SMALL REVISION AMPUTATION (MINOR PROCEDURE) ;  Surgeon: Schuyler Amor, MD;  Location: [facility];  Service: Orthopedics;  Laterality: Left;  . Colonoscopy with propofol N/A 04/07/2014    Procedure: COLONOSCOPY WITH PROPOFOL;  Surgeon: Jeryl Columbia, MD;  [facility];  Service: Endoscopy;  Laterality: N/A;  . Total knee revision Left 01/25/2015    Procedure: LEFT TOTAL KNEE  ARTHOPLASTY REVISION;  Surgeon: Gaynelle Arabian, MD;  [facility];  Service: Orthopedics;  Laterality: Left;  . Cardiac catheterization N/A 04/26/2015    Procedure: Left Heart Cath and Coronary Angiography;  Surgeon: Jettie Booze, MD;  Location: [place] [facility];  Service: Cardiovascular;  Laterality: N/A;  . Cardiac catheterization  04/26/2015    Procedure: Intravascular Ultrasound/IVUS;  Surgeon: Charlann Lange  Irish Lack, MD;  Location: [facility];  Service: Cardiovascular;;    There were no vitals filed for this visit.  Visit Diagnosis:  Knee stiffness, left - Plan: PT plan of care cert/re-cert  Weakness of left lower extremity - Plan: PT plan of care cert/re-cert  Swelling of limb - Plan: PT plan of care cert/re-cert      Subjective Assessment - 05/22/15 1413    Subjective Pt reports knee feels pretty good, but still discomfort in suprapatellar area.   Currently in Pain? Yes   Pain Score 3    Pain Location Knee   Pain Orientation Left   Pain Descriptors / Indicators Aching   Pain Type Chronic pain   Pain Onset More  than a month ago   Pain Frequency Intermittent   Multiple Pain Sites No            [facility] PT Assessment - 05/22/15 0001    Assessment   Medical Diagnosis Z96.652 - Status post revision of total replacement of the knee    Onset Date/Surgical Date 01/26/15   Observation/Other Assessments   Focus on Therapeutic Outcomes (FOTO)  49% limitation   AROM   Overall AROM  Deficits   Overall AROM Comments 102 degrees flexion, 0 degrees extension                     [facility] Adult PT Treatment/Exercise - 05/22/15 0001    Lumbar Exercises: Machines for Strengthening   Leg Press ST #6 80# 3x 10, 40# Lt leg only   Knee/Hip Exercises: Public affairs consultant 3 reps;Left;20 seconds  on stairs   Knee/Hip Exercises: Aerobic   Stationary Bike Full fwd ROM L2 x11 min   Elliptical Ramp 5, Level 5 minutes   Knee/Hip Exercises: Standing   Lateral Step Up Left;Hand Hold: 1;3 sets  on 8" step   Forward Step Up Left;Hand Hold: 1;3 sets;10 reps  on 8" step   SLS --   Rebounder --   Walking with Sports Cord 30# forward/reverse backward/reverse x10   Vasopneumatic   Number Minutes Vasopneumatic  15 minutes   Vasopnuematic Location  Knee   Vasopneumatic Pressure High   Vasopneumatic Temperature  3 snowflakes                  PT Short Term Goals - 05/15/15 9379    PT SHORT TERM GOAL #1   Title Independent with HEP    Time 4   Period Weeks   Status Achieved   PT SHORT TERM GOAL #2   Title Improve Lt knee flexion AROM to 95 degrees to improve ability to do elliptical    Time 4   Period Weeks   Status Achieved   PT SHORT TERM GOAL #3   Title Improve Lt knee extension AROM to -5 for fluid gait pattern    Time 4   Period Weeks   Status Achieved           PT Long Term Goals - 05/22/15 1419    PT LONG TERM GOAL #1   Title Be independent with advanced HEP    Time 8   Period Weeks   Status On-going   PT LONG TERM GOAL #2   Title Improve FOTO disabiltiy to < or =  to 43%   Time 8   Period Weeks   Status On-going   PT LONG TERM GOAL #3   Title Increase LE strength to return to  1 hour cardio workout on elliptical    Time 8   Period Weeks   Status On-going   PT LONG TERM GOAL #4   Title Improve knee flexion AROM to 105 in order to use riding lawnmower    Time 8   Period Weeks   Status On-going   PT LONG TERM GOAL #5   Title wean from the cane to no device for community distances > or = to 75% of the time   Time 8   Period Weeks   Status Achieved   Additional Long Term Goals   Additional Long Term Goals Yes   PT LONG TERM GOAL #6   Title improve Lt knee AROM and strength to descend steps with 50% increased ease   Time 8   Period Weeks   Status New               Plan - 05/22/15 1429    Clinical Impression Statement Pt able to step up 8" steps, what shows improvement in strength and control left knee.    Pt will benefit from skilled therapeutic intervention in order to improve on the following deficits Decreased range of motion;Difficulty walking;Decreased endurance;Decreased activity tolerance;Pain;Decreased scar mobility;Increased edema;Decreased strength   Rehab Potential Good   PT Frequency 3x / week   PT Duration 6 weeks   PT Treatment/Interventions Moist Heat;Ultrasound;Vasopneumatic Device;Cryotherapy;Electrical Stimulation;Gait training;Functional mobility training;Therapeutic exercise;Neuromuscular re-education;Patient/family education;Passive range of motion;Energy conservation;Visual/perceptual remediation/compensation   PT Next Visit Plan Continue with Left LE strength with focus on gastrocnemius and hamstrings, improve ROM into flexion   PT Home Exercise Plan current HEP   Consulted and Agree with Plan of Care Patient        Problem List Patient Active Problem List   Diagnosis Date Noted  . Chronic systolic CHF (congestive heart failure)   . Nonischemic cardiomyopathy   . Type II diabetes mellitus   .  Cardiomyopathy   . Aspiration pneumonia 04/22/2015  . Elevated troponin 04/21/2015  . Altered mental status   . Status epilepticus 04/19/2015  . Altered consciousness   . Acute respiratory failure   . Seizure-like activity   . Acute encephalopathy   . Failed total knee arthroplasty 01/25/2015  . OA (osteoarthritis) of knee 01/25/2015  . Essential tremor 03/09/2014  . Facial spasm 04/14/2013  . Arthritis   . Depression   . Hypertension   . Prostate cancer, primary, with metastasis from prostate to other site   . PONV (postoperative nausea and vomiting)   . De Quervain's tenosynovitis, left 04/13/2012    Sigurd Sos, PT 05/22/2015 3:40 PM  Rivka Barbara, PTA 05/22/2015 3:40 PM   [facility] [address] [place], [place], [address] Phone: [phone]   Fax:  [phone]

## 2015-05-24 ENCOUNTER — Encounter: Payer: Self-pay | Admitting: Physical Therapy

## 2015-05-24 ENCOUNTER — Ambulatory Visit: Payer: 59 | Admitting: Physical Therapy

## 2015-05-24 DIAGNOSIS — M25662 Stiffness of left knee, not elsewhere classified: Secondary | ICD-10-CM | POA: Diagnosis not present

## 2015-05-24 DIAGNOSIS — R29898 Other symptoms and signs involving the musculoskeletal system: Secondary | ICD-10-CM

## 2015-05-24 DIAGNOSIS — M7989 Other specified soft tissue disorders: Secondary | ICD-10-CM

## 2015-05-24 NOTE — Therapy (Signed)
[facility] [address] [place], [place], [address] Phone: [phone]   Fax:  [phone]  Physical Therapy Treatment  Patient Details  Name: Dale Morrow MRN: [id number] Date of Birth: 1941-09-17 Referring Provider:  Gaynelle Arabian, MD  Encounter Date: 05/24/2015      PT End of Session - 05/24/15 1117    Visit Number 13   Number of Visits 20   Date for PT Re-Evaluation 07/03/15   PT Start Time 8921   PT Stop Time 1201   PT Time Calculation (min) 63 min   Activity Tolerance Patient tolerated treatment well   Behavior During Therapy [facility] for tasks assessed/performed      Past Medical History  Diagnosis Date  . Arthritis     left knee  . Depression   . De Quervain's tenosynovitis, left 04/2012  . Tremors of nervous system     takes propranolol for tremors  . Prostate cancer, primary, with metastasis from prostate to other site     a. metastasis to lymph nodes;  b. s/p XRT.  [name] Complication of anesthesia     pt denies ponv, pt woke up during colonscopy in past  . Diabetes mellitus     Insulin pump, takes lisinopril for kidneys, dr Buddy Duty endocrinologist  . Left-sided Bell's palsy   . Right-sided Bell's palsy   . Nonischemic cardiomyopathy     a. 04/2015 Echo: EF 25-30%, mid apical, antseptal, anterolat, inflat, infsept, apical AK. ? takotsubo. No thrombus. PASP 87mmHg;  b. 04/2014 Lexi MV: EF 29%, fixed mod size and intensity mid-dist ant, apical, inferior scar w/ wma's;  c. 04/2015 Cath: LM nl, LAD 50p/m, LCX 25p, RI nl, RCA nl, RPAV 50d-->Med Rx.  . Chronic systolic CHF (congestive heart failure)     a. 04/2015 Echo: EF 25-30%.    Past Surgical History  Procedure Laterality Date  . Hemorrhoid surgery  1970s  . Anterior cruciate ligament repair      right knee  . Wrist arthroscopy  05/03/2010    right; and release of 1st dorsal compartment  . Knee arthrotomy  03/18/2008    left; with scar exc.  [name] Knee  arthroscopy  09/25/2007; 04/15/2005    left  . Knee joint manipulation  06/29/2007    left  . Total knee arthroplasty  04/29/2007    left  . Dorsal compartment release  04/21/2012    Procedure: RELEASE DORSAL COMPARTMENT (DEQUERVAIN);  Surgeon: Cammie Sickle., MD;  Location: [facility];  Service: Orthopedics;  Laterality: Left;  left 1st dorsal compartment release left wrist   . Minor amputation of digit Left 07/21/2013    Procedure: LEFT SMALL REVISION AMPUTATION (MINOR PROCEDURE) ;  Surgeon: Schuyler Amor, MD;  Location: [place];  Service: Orthopedics;  Laterality: Left;  . Colonoscopy with propofol N/A 04/07/2014    Procedure: COLONOSCOPY WITH PROPOFOL;  Surgeon: Jeryl Columbia, MD;  [facility]: [facility];  Service: Endoscopy;  Laterality: N/A;  . Total knee revision Left 01/25/2015    Procedure: LEFT TOTAL KNEE  ARTHOPLASTY REVISION;  Surgeon: Gaynelle Arabian, MD;  Location: [facility];  Service: Orthopedics;  Laterality: Left;  . Cardiac catheterization N/A 04/26/2015    Procedure: Left Heart Cath and Coronary Angiography;  Surgeon: Jettie Booze, MD;  Location: [place] [facility];  Service: Cardiovascular;  Laterality: N/A;  . Cardiac catheterization  04/26/2015    Procedure: Intravascular Ultrasound/IVUS;  Surgeon: Charlann Lange  Irish Lack, MD;  Location: [place] [facility];  Service: Cardiovascular;;    There were no vitals filed for this visit.  Visit Diagnosis:  Knee stiffness, left  Weakness of left lower extremity  Swelling of limb      Subjective Assessment - 05/24/15 1113    Subjective Pt saw Dr Maureen Ralphs yesterday, and he is very pleased with the progress made, but also per pt reports recommends at least 4 more weeks of PT   Currently in Pain? Yes   Pain Score 3    Pain Location Knee   Pain Orientation Left   Pain Descriptors / Indicators Aching   Pain Type Chronic pain   Multiple Pain Sites No                          [facility] Adult PT Treatment/Exercise - 05/24/15 0001    Lumbar Exercises: Machines for Strengthening   Leg Press ST #6 85# 3x 10, 40# Lt leg only   Knee/Hip Exercises: Public affairs consultant 3 reps;Left;20 seconds  on stairs   Knee/Hip Exercises: Aerobic   Stationary Bike Full fwd ROM L2 x12 min   Knee/Hip Exercises: Standing   Lateral Step Up Left;3 sets;10 reps;Hand Hold: 1;Step Height: 8"  3rd set with only 3 finger support   Forward Step Up Left;3 sets;10 reps;Hand Hold: 1;Step Height: 8"  last set with only 3 finger support   SLS 3 x 20 sec on Lt knee   Rebounder 3way shift x 1 min each   Walking with Sports Cord 30# forward/reverse backward/reverse x10   Vasopneumatic   Number Minutes Vasopneumatic  15 minutes   Vasopnuematic Location  Knee   Vasopneumatic Pressure High   Vasopneumatic Temperature  3 snowflakes                  PT Short Term Goals - 05/15/15 6333    PT SHORT TERM GOAL #1   Title Independent with HEP    Time 4   Period Weeks   Status Achieved   PT SHORT TERM GOAL #2   Title Improve Lt knee flexion AROM to 95 degrees to improve ability to do elliptical    Time 4   Period Weeks   Status Achieved   PT SHORT TERM GOAL #3   Title Improve Lt knee extension AROM to -5 for fluid gait pattern    Time 4   Period Weeks   Status Achieved           PT Long Term Goals - 05/22/15 1419    PT LONG TERM GOAL #1   Title Be independent with advanced HEP    Time 8   Period Weeks   Status On-going   PT LONG TERM GOAL #2   Title Improve FOTO disabiltiy to < or = to 43%   Time 8   Period Weeks   Status On-going   PT LONG TERM GOAL #3   Title Increase LE strength to return to 1 hour cardio workout on elliptical    Time 8   Period Weeks   Status On-going   PT LONG TERM GOAL #4   Title Improve knee flexion AROM to 105 in order to use riding lawnmower    Time 8   Period Weeks   Status On-going   PT LONG TERM GOAL #5   Title wean from the cane to  no device for community distances > or =  to 75% of the time   Time 8   Period Weeks   Status Achieved   Additional Long Term Goals   Additional Long Term Goals Yes   PT LONG TERM GOAL #6   Title improve Lt knee AROM and strength to descend steps with 50% increased ease   Time 8   Period Weeks   Status New               Plan - 05/24/15 1117    Clinical Impression Statement Pt continues to improve with qualty of movement with activities in PT and continues to gain on strength, endurance and control Lt LE   Rehab Potential Good   PT Frequency 3x / week   PT Duration 6 weeks   PT Treatment/Interventions Moist Heat;Ultrasound;Vasopneumatic Device;Cryotherapy;Electrical Stimulation;Gait training;Functional mobility training;Therapeutic exercise;Neuromuscular re-education;Patient/family education;Passive range of motion;Energy conservation;Visual/perceptual remediation/compensation   PT Next Visit Plan continue with Left LE strength with focus on gastrocnemius and hamstrings, improve ROM into flexion   Consulted and Agree with Plan of Care Patient        Problem List Patient Active Problem List   Diagnosis Date Noted  . Chronic systolic CHF (congestive heart failure)   . Nonischemic cardiomyopathy   . Type II diabetes mellitus   . Cardiomyopathy   . Aspiration pneumonia 04/22/2015  . Elevated troponin 04/21/2015  . Altered mental status   . Status epilepticus 04/19/2015  . Altered consciousness   . Acute respiratory failure   . Seizure-like activity   . Acute encephalopathy   . Failed total knee arthroplasty 01/25/2015  . OA (osteoarthritis) of knee 01/25/2015  . Essential tremor 03/09/2014  . Facial spasm 04/14/2013  . Arthritis   . Depression   . Hypertension   . Prostate cancer, primary, with metastasis from prostate to other site   . PONV (postoperative nausea and vomiting)   . De Quervain's tenosynovitis, left 04/13/2012    NAUMANN-HOUEGNIFIO,Dale Morrow  PTA 05/24/2015, 11:37 AM  [facility] [address] [place], [place], [address] Phone: [phone]   Fax:  [phone]

## 2015-05-25 ENCOUNTER — Other Ambulatory Visit: Payer: Self-pay

## 2015-05-25 ENCOUNTER — Ambulatory Visit (HOSPITAL_COMMUNITY): Payer: 59 | Attending: Cardiology

## 2015-05-25 DIAGNOSIS — E119 Type 2 diabetes mellitus without complications: Secondary | ICD-10-CM | POA: Diagnosis not present

## 2015-05-25 DIAGNOSIS — I429 Cardiomyopathy, unspecified: Secondary | ICD-10-CM | POA: Insufficient documentation

## 2015-05-25 DIAGNOSIS — I428 Other cardiomyopathies: Secondary | ICD-10-CM

## 2015-05-25 DIAGNOSIS — I5022 Chronic systolic (congestive) heart failure: Secondary | ICD-10-CM

## 2015-05-26 ENCOUNTER — Encounter: Payer: Self-pay | Admitting: Physical Therapy

## 2015-05-26 ENCOUNTER — Ambulatory Visit: Payer: 59 | Admitting: Physical Therapy

## 2015-05-26 DIAGNOSIS — M7989 Other specified soft tissue disorders: Secondary | ICD-10-CM

## 2015-05-26 DIAGNOSIS — M25662 Stiffness of left knee, not elsewhere classified: Secondary | ICD-10-CM

## 2015-05-26 DIAGNOSIS — R29898 Other symptoms and signs involving the musculoskeletal system: Secondary | ICD-10-CM

## 2015-05-26 NOTE — Therapy (Signed)
[facility] [address], [place], [place], [address] Phone: [phone]   Fax:  [phone]  Physical Therapy Treatment  Patient Details  Name: Dale Morrow MRN: [id number] Date of Birth: [birth year] Referring Provider:  Maurice Small, MD  Encounter Date: 05/26/2015      PT End of Session - 05/26/15 1049    Visit Number 14   Number of Visits 20   Date for PT Re-Evaluation 07/03/15   PT Start Time 1009   PT Stop Time 1116   PT Time Calculation (min) 67 min   Activity Tolerance Patient tolerated treatment well   Behavior During Therapy [facility] for tasks assessed/performed      Past Medical History  Diagnosis Date  . Arthritis     left knee  . Depression   . De Quervain's tenosynovitis, left 04/2012  . Tremors of nervous system     takes propranolol for tremors  . Prostate cancer, primary, with metastasis from prostate to other site     a. metastasis to lymph nodes;  b. s/p XRT.  [name] Complication of anesthesia     pt denies ponv, pt woke up during colonscopy in past  . Diabetes mellitus     Insulin pump, takes lisinopril for kidneys, dr Buddy Duty endocrinologist  . Left-sided Bell's palsy   . Right-sided Bell's palsy   . Nonischemic cardiomyopathy     a. 04/2015 Echo: EF 25-30%, mid apical, antseptal, anterolat, inflat, infsept, apical AK. ? takotsubo. No thrombus. PASP 71mmHg;  b. 04/2014 Lexi MV: EF 29%, fixed mod size and intensity mid-dist ant, apical, inferior scar w/ wma's;  c. 04/2015 Cath: LM nl, LAD 50p/m, LCX 25p, RI nl, RCA nl, RPAV 50d-->Med Rx.  . Chronic systolic CHF (congestive heart failure)     a. 04/2015 Echo: EF 25-30%.    Past Surgical History  Procedure Laterality Date  . Hemorrhoid surgery  1970s  . Anterior cruciate ligament repair      right knee  . Wrist arthroscopy  05/03/2010    right; and release of 1st dorsal compartment  . Knee arthrotomy  03/18/2008    left; with scar exc.  [name] Knee  arthroscopy  09/25/2007; 04/15/2005    left  . Knee joint manipulation  06/29/2007    left  . Total knee arthroplasty  04/29/2007    left  . Dorsal compartment release  04/21/2012    Procedure: RELEASE DORSAL COMPARTMENT (DEQUERVAIN);  Surgeon: Cammie Sickle., MD;  Location: [facility];  Service: Orthopedics;  Laterality: Left;  left 1st dorsal compartment release left wrist   . Minor amputation of digit Left 07/21/2013    Procedure: LEFT SMALL REVISION AMPUTATION (MINOR PROCEDURE) ;  Surgeon: Schuyler Amor, MD;  Location: [place];  Service: Orthopedics;  Laterality: Left;  . Colonoscopy with propofol N/A 04/07/2014    Procedure: COLONOSCOPY WITH PROPOFOL;  Surgeon: Jeryl Columbia, MD;  [facility];  Service: Endoscopy;  Laterality: N/A;  . Total knee revision Left 01/25/2015    Procedure: LEFT TOTAL KNEE  ARTHOPLASTY REVISION;  Surgeon: Gaynelle Arabian, MD;  [facility]: [facility];  Service: Orthopedics;  Laterality: Left;  . Cardiac catheterization N/A 04/26/2015    Procedure: Left Heart Cath and Coronary Angiography;  Surgeon: Jettie Booze, MD;  Location: [place] [facility];  Service: Cardiovascular;  Laterality: N/A;  . Cardiac catheterization  04/26/2015    Procedure: Intravascular Ultrasound/IVUS;  Surgeon: Charlann Lange  Irish Lack, MD;  Location: [place] [facility];  Service: Cardiovascular;;    There were no vitals filed for this visit.  Visit Diagnosis:  Knee stiffness, left  Weakness of left lower extremity  Swelling of limb      Subjective Assessment - 05/26/15 1025    Subjective Pt feels good, he used the bike in the gym yesterday   Currently in Pain? Yes   Pain Score 3    Pain Location Knee   Pain Orientation Left   Pain Descriptors / Indicators Aching   Pain Type Chronic pain   Pain Onset More than a month ago   Pain Frequency Intermittent   Multiple Pain Sites No                         [facility] Adult PT  Treatment/Exercise - 05/26/15 0001    Lumbar Exercises: Machines for Strengthening   Leg Press ST #6 85# 3x 10, 40# Lt leg only  increase weigth next visit   Knee/Hip Exercises: Stretches   Sports administrator 3 reps;Left;20 seconds  on stairs   Knee/Hip Exercises: Aerobic   Stationary Bike Full fwd ROM L2 x10 min   Elliptical Ramp 5, Level 5 minutes   Knee/Hip Exercises: Standing   Lateral Step Up Left;Step Height: 8";3 sets;Hand Hold: 1   Forward Step Up Left;3 sets;10 reps;Hand Hold: 1;Step Height: 8"   SLS 3 x 20 sec on Lt knee  with modification at times   Rebounder 3way shift x 1 min each   Vasopneumatic   Number Minutes Vasopneumatic  15 minutes   Vasopnuematic Location  Knee   Vasopneumatic Pressure High   Vasopneumatic Temperature  3 snowflakes                  PT Short Term Goals - 05/15/15 0623    PT SHORT TERM GOAL #1   Title Independent with HEP    Time 4   Period Weeks   Status Achieved   PT SHORT TERM GOAL #2   Title Improve Lt knee flexion AROM to 95 degrees to improve ability to do elliptical    Time 4   Period Weeks   Status Achieved   PT SHORT TERM GOAL #3   Title Improve Lt knee extension AROM to -5 for fluid gait pattern    Time 4   Period Weeks   Status Achieved           PT Long Term Goals - 05/22/15 1419    PT LONG TERM GOAL #1   Title Be independent with advanced HEP    Time 8   Period Weeks   Status On-going   PT LONG TERM GOAL #2   Title Improve FOTO disabiltiy to < or = to 43%   Time 8   Period Weeks   Status On-going   PT LONG TERM GOAL #3   Title Increase LE strength to return to 1 hour cardio workout on elliptical    Time 8   Period Weeks   Status On-going   PT LONG TERM GOAL #4   Title Improve knee flexion AROM to 105 in order to use riding lawnmower    Time 8   Period Weeks   Status On-going   PT LONG TERM GOAL #5   Title wean from the cane to no device for community distances > or = to 75% of the time   Time 8  Period Weeks   Status Achieved   Additional Long Term Goals   Additional Long Term Goals Yes   PT LONG TERM GOAL #6   Title improve Lt knee AROM and strength to descend steps with 50% increased ease   Time 8   Period Weeks   Status New               Plan - 05/26/15 1050    Clinical Impression Statement Pt continues to improve with endurance and strengt, eccentric control left LE still limited. Pt will continue to benefit from skilled PT   Pt will benefit from skilled therapeutic intervention in order to improve on the following deficits Decreased range of motion;Difficulty walking;Decreased endurance;Decreased activity tolerance;Pain;Decreased scar mobility;Increased edema;Decreased strength   Rehab Potential Good   PT Frequency 3x / week   PT Duration 6 weeks   PT Treatment/Interventions Moist Heat;Ultrasound;Vasopneumatic Device;Cryotherapy;Electrical Stimulation;Gait training;Functional mobility training;Therapeutic exercise;Neuromuscular re-education;Patient/family education;Passive range of motion;Energy conservation;Visual/perceptual remediation/compensation   PT Next Visit Plan continue with Left LE strength with focus on gastrocnemius and hamstrings, improve ROM into flexion   PT Home Exercise Plan current HEP   Consulted and Agree with Plan of Care Patient        Problem List Patient Active Problem List   Diagnosis Date Noted  . Chronic systolic CHF (congestive heart failure)   . Nonischemic cardiomyopathy   . Type II diabetes mellitus   . Cardiomyopathy   . Aspiration pneumonia 04/22/2015  . Elevated troponin 04/21/2015  . Altered mental status   . Status epilepticus 04/19/2015  . Altered consciousness   . Acute respiratory failure   . Seizure-like activity   . Acute encephalopathy   . Failed total knee arthroplasty 01/25/2015  . OA (osteoarthritis) of knee 01/25/2015  . Essential tremor 03/09/2014  . Facial spasm 04/14/2013  . Arthritis   .  Depression   . Hypertension   . Prostate cancer, primary, with metastasis from prostate to other site   . PONV (postoperative nausea and vomiting)   . De Quervain's tenosynovitis, left 04/13/2012    NAUMANN-HOUEGNIFIO,Feliberto Stockley PTA 05/26/2015, 10:53 AM  [facility] [address], [place], [place], [address] Phone: [phone]   Fax:  [phone]

## 2015-05-29 ENCOUNTER — Ambulatory Visit: Payer: 59 | Admitting: Physical Therapy

## 2015-05-29 ENCOUNTER — Encounter: Payer: Self-pay | Admitting: Physical Therapy

## 2015-05-29 ENCOUNTER — Telehealth: Payer: Self-pay | Admitting: Physician Assistant

## 2015-05-29 DIAGNOSIS — R29898 Other symptoms and signs involving the musculoskeletal system: Secondary | ICD-10-CM

## 2015-05-29 DIAGNOSIS — M7989 Other specified soft tissue disorders: Secondary | ICD-10-CM

## 2015-05-29 DIAGNOSIS — M25662 Stiffness of left knee, not elsewhere classified: Secondary | ICD-10-CM

## 2015-05-29 NOTE — Telephone Encounter (Signed)
New message     Pt returning call about test results Please call to discuss

## 2015-05-29 NOTE — Telephone Encounter (Signed)
LMTCB for pt 

## 2015-05-29 NOTE — Therapy (Signed)
[facility] [address], [place], [place], [address] Phone: [phone]   Fax:  [phone]  Physical Therapy Treatment  Patient Details  Name: Dale Morrow MRN: [id number] Date of Birth: 1941/08/20 Referring Provider:  Gaynelle Arabian, MD  Encounter Date: 05/29/2015      PT End of Session - 05/29/15 0956    Visit Number 15   Number of Visits 20   Date for PT Re-Evaluation 07/03/15   PT Start Time 0922   PT Stop Time 1030   PT Time Calculation (min) 68 min   Activity Tolerance Patient tolerated treatment well   Behavior During Therapy [facility] for tasks assessed/performed      Past Medical History  Diagnosis Date  . Arthritis     left knee  . Depression   . De Quervain's tenosynovitis, left 04/2012  . Tremors of nervous system     takes propranolol for tremors  . Prostate cancer, primary, with metastasis from prostate to other site     a. metastasis to lymph nodes;  b. s/p XRT.  [name] Complication of anesthesia     pt denies ponv, pt woke up during colonscopy in past  . Diabetes mellitus     Insulin pump, takes lisinopril for kidneys, dr Buddy Duty endocrinologist  . Left-sided Bell's palsy   . Right-sided Bell's palsy   . Nonischemic cardiomyopathy     a. 04/2015 Echo: EF 25-30%, mid apical, antseptal, anterolat, inflat, infsept, apical [place]. ? takotsubo. No thrombus. PASP 53mmHg;  b. 04/2014 Lexi MV: EF 29%, fixed mod size and intensity mid-dist ant, apical, inferior scar w/ wma's;  c. 04/2015 Cath: LM nl, LAD 50p/m, LCX 25p, RI nl, RCA nl, RPAV 50d-->Med Rx.  . Chronic systolic CHF (congestive heart failure)     a. 04/2015 Echo: EF 25-30%.    Past Surgical History  Procedure Laterality Date  . Hemorrhoid surgery  1970s  . Anterior cruciate ligament repair      right knee  . Wrist arthroscopy  05/03/2010    right; and release of 1st dorsal compartment  . Knee arthrotomy  03/18/2008    left; with scar exc.  [name] Knee  arthroscopy  09/25/2007; 04/15/2005    left  . Knee joint manipulation  06/29/2007    left  . Total knee arthroplasty  04/29/2007    left  . Dorsal compartment release  04/21/2012    Procedure: RELEASE DORSAL COMPARTMENT (DEQUERVAIN);  Surgeon: Cammie Sickle., MD;  Location: [facility];  Service: Orthopedics;  Laterality: Left;  left 1st dorsal compartment release left wrist   . Minor amputation of digit Left 07/21/2013    Procedure: LEFT SMALL REVISION AMPUTATION (MINOR PROCEDURE) ;  Surgeon: Schuyler Amor, MD;  Location: [place];  Service: Orthopedics;  Laterality: Left;  . Colonoscopy with propofol N/A 04/07/2014    Procedure: COLONOSCOPY WITH PROPOFOL;  Surgeon: Jeryl Columbia, MD;  [facility];  Service: Endoscopy;  Laterality: N/A;  . Total knee revision Left 01/25/2015    Procedure: LEFT TOTAL KNEE  ARTHOPLASTY REVISION;  Surgeon: Gaynelle Arabian, MD;  [facility]: [facility];  Service: Orthopedics;  Laterality: Left;  . Cardiac catheterization N/A 04/26/2015    Procedure: Left Heart Cath and Coronary Angiography;  Surgeon: Jettie Booze, MD;  Location: [place] [facility];  Service: Cardiovascular;  Laterality: N/A;  . Cardiac catheterization  04/26/2015    Procedure: Intravascular Ultrasound/IVUS;  Surgeon: Charlann Lange  Irish Lack, MD;  Location: [place] [facility];  Service: Cardiovascular;;    There were no vitals filed for this visit.  Visit Diagnosis:  Knee stiffness, left  Weakness of left lower extremity  Swelling of limb      Subjective Assessment - 05/29/15 0941    Subjective Pt feels some sorness in Rt leg, due to working in the garden -weedeating   Currently in Pain? Yes   Pain Score 4    Pain Location Knee   Pain Orientation Left   Pain Descriptors / Indicators Aching   Pain Type Chronic pain   Pain Onset More than a month ago   Pain Frequency Intermittent                         [facility] Adult PT  Treatment/Exercise - 05/29/15 0001    Lumbar Exercises: Machines for Strengthening   Leg Press ST #6 90# 3x 10, 40# Lt leg only   Knee/Hip Exercises: Public affairs consultant 3 reps;Left;20 seconds  on stairs   Knee/Hip Exercises: Aerobic   Stationary Bike Full fwd ROM L2 x15 min   Elliptical Ramp 5, Level 5 minutes   Knee/Hip Exercises: Standing   Lateral Step Up Left;Step Height: 8";3 sets;Hand Hold: 1   Forward Step Up Left;3 sets;10 reps;Hand Hold: 1;Step Height: 8"   SLS 3 x 20 sec on Lt knee   Rebounder 3way shift x 1 min each   Vasopneumatic   Number Minutes Vasopneumatic  15 minutes   Vasopnuematic Location  Knee   Vasopneumatic Pressure High   Vasopneumatic Temperature  3 snowflakes                  PT Short Term Goals - 05/15/15 2683    PT SHORT TERM GOAL #1   Title Independent with HEP    Time 4   Period Weeks   Status Achieved   PT SHORT TERM GOAL #2   Title Improve Lt knee flexion AROM to 95 degrees to improve ability to do elliptical    Time 4   Period Weeks   Status Achieved   PT SHORT TERM GOAL #3   Title Improve Lt knee extension AROM to -5 for fluid gait pattern    Time 4   Period Weeks   Status Achieved           PT Long Term Goals - 05/29/15 4196    PT LONG TERM GOAL #1   Title Be independent with advanced HEP    Time 8   Period Weeks   Status On-going   PT LONG TERM GOAL #2   Title Improve FOTO disabiltiy to < or = to 43%   Time 8   Period Weeks   Status On-going   PT LONG TERM GOAL #3   Title Increase LE strength to return to 1 hour cardio workout on elliptical    Time 8   Period Weeks   Status On-going   PT LONG TERM GOAL #4   Title Improve knee flexion AROM to 105 in order to use riding lawnmower    Time 8   Period Weeks   Status On-going   PT LONG TERM GOAL #5   Title wean from the cane to no device for community distances > or = to 75% of the time   Time 8   Period Weeks   Status Achieved   PT LONG TERM GOAL #6  Title improve Lt knee AROM and strength to descend steps with 50% increased ease   Time 8   Period Weeks   Status On-going               Plan - 05/29/15 3005    Clinical Impression Statement Pt continues to improve with endurance and strength, eccentric control left LE is still weak. pt will continue to benefit from skilled PT   Pt will benefit from skilled therapeutic intervention in order to improve on the following deficits Decreased range of motion;Difficulty walking;Decreased endurance;Decreased activity tolerance;Pain;Decreased scar mobility;Increased edema;Decreased strength   Rehab Potential Good   PT Frequency 3x / week   PT Duration 6 weeks   PT Treatment/Interventions Moist Heat;Ultrasound;Vasopneumatic Device;Cryotherapy;Electrical Stimulation;Gait training;Functional mobility training;Therapeutic exercise;Neuromuscular re-education;Patient/family education;Passive range of motion;Energy conservation;Visual/perceptual remediation/compensation   PT Next Visit Plan continue with left LE strength with focus on gastrocnemius and hamstrings, improve ROM into flexion        Problem List Patient Active Problem List   Diagnosis Date Noted  . Chronic systolic CHF (congestive heart failure)   . Nonischemic cardiomyopathy   . Type II diabetes mellitus   . Cardiomyopathy   . Aspiration pneumonia 04/22/2015  . Elevated troponin 04/21/2015  . Altered mental status   . Status epilepticus 04/19/2015  . Altered consciousness   . Acute respiratory failure   . Seizure-like activity   . Acute encephalopathy   . Failed total knee arthroplasty 01/25/2015  . OA (osteoarthritis) of knee 01/25/2015  . Essential tremor 03/09/2014  . Facial spasm 04/14/2013  . Arthritis   . Depression   . Hypertension   . Prostate cancer, primary, with metastasis from prostate to other site   . PONV (postoperative nausea and vomiting)   . De Quervain's tenosynovitis, left 04/13/2012     NAUMANN-HOUEGNIFIO,Isabeau Mccalla PTA 05/29/2015, 10:15 AM  [facility] [address] [place], [place], [address] Phone: [phone]   Fax:  [phone]

## 2015-05-29 NOTE — Telephone Encounter (Signed)
Spoke with patient about echocardiogram results done 05/25/15.

## 2015-05-31 ENCOUNTER — Encounter: Payer: Self-pay | Admitting: Physical Therapy

## 2015-05-31 ENCOUNTER — Ambulatory Visit: Payer: 59 | Admitting: Physical Therapy

## 2015-05-31 DIAGNOSIS — M7989 Other specified soft tissue disorders: Secondary | ICD-10-CM

## 2015-05-31 DIAGNOSIS — M25662 Stiffness of left knee, not elsewhere classified: Secondary | ICD-10-CM

## 2015-05-31 DIAGNOSIS — R29898 Other symptoms and signs involving the musculoskeletal system: Secondary | ICD-10-CM

## 2015-05-31 NOTE — Therapy (Signed)
[facility] [address], [place], [place], [address] Phone: [phone]   Fax:  [phone]  Physical Therapy Treatment  Patient Details  Name: Dale Morrow MRN: [id number] Date of Birth: 06-07-1941 Referring Provider:  Gaynelle Arabian, MD  Encounter Date: 05/31/2015      PT End of Session - 05/31/15 0942    Visit Number 16   Number of Visits 20   Date for PT Re-Evaluation 07/03/15   PT Start Time 0920   PT Stop Time 1028   PT Time Calculation (min) 68 min   Activity Tolerance Patient tolerated treatment well   Behavior During Therapy [facility] for tasks assessed/performed      Past Medical History  Diagnosis Date  . Arthritis     left knee  . Depression   . De Quervain's tenosynovitis, left 04/2012  . Tremors of nervous system     takes propranolol for tremors  . Prostate cancer, primary, with metastasis from prostate to other site     a. metastasis to lymph nodes;  b. s/p XRT.  [name] Complication of anesthesia     pt denies ponv, pt woke up during colonscopy in past  . Diabetes mellitus     Insulin pump, takes lisinopril for kidneys, dr Buddy Duty endocrinologist  . Left-sided Bell's palsy   . Right-sided Bell's palsy   . Nonischemic cardiomyopathy     a. 04/2015 Echo: EF 25-30%, mid apical, antseptal, anterolat, inflat, infsept, apical AK. ? takotsubo. No thrombus. PASP 29mmHg;  b. 04/2014 Lexi MV: EF 29%, fixed mod size and intensity mid-dist ant, apical, inferior scar w/ wma's;  c. 04/2015 Cath: LM nl, LAD 50p/m, LCX 25p, RI nl, RCA nl, RPAV 50d-->Med Rx.  . Chronic systolic CHF (congestive heart failure)     a. 04/2015 Echo: EF 25-30%.    Past Surgical History  Procedure Laterality Date  . Hemorrhoid surgery  1970s  . Anterior cruciate ligament repair      right knee  . Wrist arthroscopy  05/03/2010    right; and release of 1st dorsal compartment  . Knee arthrotomy  03/18/2008    left; with scar exc.  [name] Knee  arthroscopy  09/25/2007; 04/15/2005    left  . Knee joint manipulation  06/29/2007    left  . Total knee arthroplasty  04/29/2007    left  . Dorsal compartment release  04/21/2012    Procedure: RELEASE DORSAL COMPARTMENT (DEQUERVAIN);  Surgeon: Cammie Sickle., MD;  Location: [facility];  Service: Orthopedics;  Laterality: Left;  left 1st dorsal compartment release left wrist   . Minor amputation of digit Left 07/21/2013    Procedure: LEFT SMALL REVISION AMPUTATION (MINOR PROCEDURE) ;  Surgeon: Schuyler Amor, MD;  Location: [place];  Service: Orthopedics;  Laterality: Left;  . Colonoscopy with propofol N/A 04/07/2014    Procedure: COLONOSCOPY WITH PROPOFOL;  Surgeon: Jeryl Columbia, MD;  Location: [facility];  Service: Endoscopy;  Laterality: N/A;  . Total knee revision Left 01/25/2015    Procedure: LEFT TOTAL KNEE  ARTHOPLASTY REVISION;  Surgeon: Gaynelle Arabian, MD;  Location: [facility];  Service: Orthopedics;  Laterality: Left;  . Cardiac catheterization N/A 04/26/2015    Procedure: Left Heart Cath and Coronary Angiography;  Surgeon: Jettie Booze, MD;  Location: [place] [facility];  Service: Cardiovascular;  Laterality: N/A;  . Cardiac catheterization  04/26/2015    Procedure: Intravascular Ultrasound/IVUS;  Surgeon: Charlann Lange  Irish Lack, MD;  Location: [place] [facility];  Service: Cardiovascular;;    There were no vitals filed for this visit.  Visit Diagnosis:  Knee stiffness, left  Weakness of left lower extremity  Swelling of limb      Subjective Assessment - 05/31/15 0940    Subjective Pt reports feeling soreness in back of knee and his walking distance is limited, after 39min soreness in knee increases   Currently in Pain? Yes   Pain Score 4    Pain Location Knee   Pain Orientation Left   Pain Descriptors / Indicators Aching   Pain Type Chronic pain   Pain Onset More than a month ago   Pain Frequency Intermittent   Multiple Pain Sites  No                         [facility] Adult PT Treatment/Exercise - 05/31/15 0001    Lumbar Exercises: Machines for Strengthening   Leg Press ST #6 90# 3x 10, 45# Lt leg only   Knee/Hip Exercises: Stretches   Active Hamstring Stretch Left;3 reps;20 seconds  foot elevated on stool   Quad Stretch 3 reps;20 seconds   Knee/Hip Exercises: Aerobic   Stationary Bike Full fwd ROM L2 x15 min   Tread Mill (50min) =1.71mph for 25min, 33min 2.51mph to improve gait pattern   Knee/Hip Exercises: Standing   Lateral Step Up Left;Step Height: 8";3 sets;Hand Hold: 1   Forward Step Up Left;3 sets;10 reps;Hand Hold: 1;Step Height: 8"   SLS 3 x 20 sec on Lt knee   Vasopneumatic   Number Minutes Vasopneumatic  15 minutes   Vasopnuematic Location  Knee   Vasopneumatic Pressure High   Vasopneumatic Temperature  3 snowflakes   Manual Therapy   Manual Therapy Joint mobilization;Soft tissue mobilization  to left knee to increase flexion                  PT Short Term Goals - 05/15/15 2595    PT SHORT TERM GOAL #1   Title Independent with HEP    Time 4   Period Weeks   Status Achieved   PT SHORT TERM GOAL #2   Title Improve Lt knee flexion AROM to 95 degrees to improve ability to do elliptical    Time 4   Period Weeks   Status Achieved   PT SHORT TERM GOAL #3   Title Improve Lt knee extension AROM to -5 for fluid gait pattern    Time 4   Period Weeks   Status Achieved           PT Long Term Goals - 05/29/15 6387    PT LONG TERM GOAL #1   Title Be independent with advanced HEP    Time 8   Period Weeks   Status On-going   PT LONG TERM GOAL #2   Title Improve FOTO disabiltiy to < or = to 43%   Time 8   Period Weeks   Status On-going   PT LONG TERM GOAL #3   Title Increase LE strength to return to 1 hour cardio workout on elliptical    Time 8   Period Weeks   Status On-going   PT LONG TERM GOAL #4   Title Improve knee flexion AROM to 105 in order to use riding  lawnmower    Time 8   Period Weeks   Status On-going   PT LONG TERM GOAL #5  Title wean from the cane to no device for community distances > or = to 75% of the time   Time 8   Period Weeks   Status Achieved   PT LONG TERM GOAL #6   Title improve Lt knee AROM and strength to descend steps with 50% increased ease   Time 8   Period Weeks   Status On-going               Plan - 05/31/15 0943    Clinical Impression Statement Pt continues to improve with endurance and strength, eccentric control left LE is still weak. Pt will continue to benefit from skilled PT.   Pt will benefit from skilled therapeutic intervention in order to improve on the following deficits Decreased range of motion;Difficulty walking;Decreased endurance;Decreased activity tolerance;Pain;Decreased scar mobility;Increased edema;Decreased strength   Rehab Potential Good   PT Frequency 3x / week   PT Duration 6 weeks   PT Treatment/Interventions Moist Heat;Ultrasound;Vasopneumatic Device;Cryotherapy;Electrical Stimulation;Gait training;Functional mobility training;Therapeutic exercise;Neuromuscular re-education;Patient/family education;Passive range of motion;Energy conservation;Visual/perceptual remediation/compensation   PT Next Visit Plan continue with left LE strength with focus on gastrocnemius and hamstrings, improve ROM into flexion   PT Home Exercise Plan current HEP   Consulted and Agree with Plan of Care Patient        Problem List Patient Active Problem List   Diagnosis Date Noted  . Chronic systolic CHF (congestive heart failure)   . Nonischemic cardiomyopathy   . Type II diabetes mellitus   . Cardiomyopathy   . Aspiration pneumonia 04/22/2015  . Elevated troponin 04/21/2015  . Altered mental status   . Status epilepticus 04/19/2015  . Altered consciousness   . Acute respiratory failure   . Seizure-like activity   . Acute encephalopathy   . Failed total knee arthroplasty 01/25/2015  . OA  (osteoarthritis) of knee 01/25/2015  . Essential tremor 03/09/2014  . Facial spasm 04/14/2013  . Arthritis   . Depression   . Hypertension   . Prostate cancer, primary, with metastasis from prostate to other site   . PONV (postoperative nausea and vomiting)   . De Quervain's tenosynovitis, left 04/13/2012    NAUMANN-HOUEGNIFIO,Brandey Vandalen PTA 05/31/2015, 10:06 AM  [facility] [address] [place], [place], [address] Phone: [phone]   Fax:  [phone]

## 2015-06-02 ENCOUNTER — Encounter: Payer: Self-pay | Admitting: Physical Therapy

## 2015-06-02 ENCOUNTER — Ambulatory Visit: Payer: 59 | Admitting: Physical Therapy

## 2015-06-02 DIAGNOSIS — M25662 Stiffness of left knee, not elsewhere classified: Secondary | ICD-10-CM

## 2015-06-02 DIAGNOSIS — M7989 Other specified soft tissue disorders: Secondary | ICD-10-CM

## 2015-06-02 DIAGNOSIS — R29898 Other symptoms and signs involving the musculoskeletal system: Secondary | ICD-10-CM

## 2015-06-02 NOTE — Therapy (Signed)
[facility] [address], [place], [place], [address] Phone: [phone]   Fax:  [phone]  Physical Therapy Treatment  Patient Details  Name: Dale Morrow MRN: [id number] Date of Birth: 08-Aug-1941 Referring Provider:  Gaynelle Arabian, MD  Encounter Date: 06/02/2015      PT End of Session - 06/02/15 1110    Visit Number 17   Number of Visits 20   Date for PT Re-Evaluation 07/03/15   PT Start Time 1021   PT Stop Time 1119   PT Time Calculation (min) 58 min   Activity Tolerance Patient tolerated treatment well   Behavior During Therapy [facility] for tasks assessed/performed      Past Medical History  Diagnosis Date  . Arthritis     left knee  . Depression   . De Quervain's tenosynovitis, left 04/2012  . Tremors of nervous system     takes propranolol for tremors  . Prostate cancer, primary, with metastasis from prostate to other site     a. metastasis to lymph nodes;  b. s/p XRT.  [name] Complication of anesthesia     pt denies ponv, pt woke up during colonscopy in past  . Diabetes mellitus     Insulin pump, takes lisinopril for kidneys, dr Buddy Duty endocrinologist  . Left-sided Bell's palsy   . Right-sided Bell's palsy   . Nonischemic cardiomyopathy     a. 04/2015 Echo: EF 25-30%, mid apical, antseptal, anterolat, inflat, infsept, apical AK. ? takotsubo. No thrombus. PASP 35mmHg;  b. 04/2014 Lexi MV: EF 29%, fixed mod size and intensity mid-dist ant, apical, inferior scar w/ wma's;  c. 04/2015 Cath: LM nl, LAD 50p/m, LCX 25p, RI nl, RCA nl, RPAV 50d-->Med Rx.  . Chronic systolic CHF (congestive heart failure)     a. 04/2015 Echo: EF 25-30%.    Past Surgical History  Procedure Laterality Date  . Hemorrhoid surgery  1970s  . Anterior cruciate ligament repair      right knee  . Wrist arthroscopy  05/03/2010    right; and release of 1st dorsal compartment  . Knee arthrotomy  03/18/2008    left; with scar exc.  [name] Knee  arthroscopy  09/25/2007; 04/15/2005    left  . Knee joint manipulation  06/29/2007    left  . Total knee arthroplasty  04/29/2007    left  . Dorsal compartment release  04/21/2012    Procedure: RELEASE DORSAL COMPARTMENT (DEQUERVAIN);  Surgeon: Cammie Sickle., MD;  Location: [facility];  Service: Orthopedics;  Laterality: Left;  left 1st dorsal compartment release left wrist   . Minor amputation of digit Left 07/21/2013    Procedure: LEFT SMALL REVISION AMPUTATION (MINOR PROCEDURE) ;  Surgeon: Schuyler Amor, MD;  Location: [place];  Service: Orthopedics;  Laterality: Left;  . Colonoscopy with propofol N/A 04/07/2014    Procedure: COLONOSCOPY WITH PROPOFOL;  Surgeon: Jeryl Columbia, MD;  Location: [facility];  Service: Endoscopy;  Laterality: N/A;  . Total knee revision Left 01/25/2015    Procedure: LEFT TOTAL KNEE  ARTHOPLASTY REVISION;  Surgeon: Gaynelle Arabian, MD;  Location: [facility];  Service: Orthopedics;  Laterality: Left;  . Cardiac catheterization N/A 04/26/2015    Procedure: Left Heart Cath and Coronary Angiography;  Surgeon: Jettie Booze, MD;  Location: [place] [facility];  Service: Cardiovascular;  Laterality: N/A;  . Cardiac catheterization  04/26/2015    Procedure: Intravascular Ultrasound/IVUS;  Surgeon: Charlann Lange  Irish Lack, MD;  Location: [place] [facility];  Service: Cardiovascular;;    There were no vitals filed for this visit.  Visit Diagnosis:  Knee stiffness, left  Weakness of left lower extremity  Swelling of limb      Subjective Assessment - 06/02/15 1039    Subjective Pt still complains of soreness in back of knee with increased walking distance.    Patient Stated Goals Get ROM back, ride mountain bike, working out at [employer] (ellipitical, 1 hour cardio) weight program    Currently in Pain? Yes   Pain Score 3    Pain Location Knee   Pain Orientation Left   Pain Descriptors / Indicators Aching   Pain Onset More than a  month ago   Pain Frequency Intermittent   Multiple Pain Sites No                         [facility] Adult PT Treatment/Exercise - 06/02/15 0001    Lumbar Exercises: Machines for Strengthening   Leg Press ST #6 95# 3x 10, 45# Lt leg only   Knee/Hip Exercises: Stretches   Active Hamstring Stretch Left;3 reps;20 seconds   Quad Stretch 3 reps;20 seconds  on stairs   Knee/Hip Exercises: Aerobic   Stationary Bike Full fwd ROM L2 x 6 min   Tread Mill (25min) =1.23mph for 55min, 39min 2.73mph to improve gait pattern   Knee/Hip Exercises: Standing   Lateral Step Up Left;Step Height: 8";3 sets;Hand Hold: 1   Forward Step Up Left;3 sets;10 reps;Hand Hold: 1;Step Height: 8"   SLS 3 x 20 sec on Lt knee  modified at times to    Vasopneumatic   Number Minutes Vasopneumatic  15 minutes   Vasopnuematic Location  Knee   Vasopneumatic Pressure High   Vasopneumatic Temperature  3 snowflakes   Manual Therapy   Manual Therapy Joint mobilization;Soft tissue mobilization  to left knee to increase flexion                   PT Short Term Goals - 05/15/15 5188    PT SHORT TERM GOAL #1   Title Independent with HEP    Time 4   Period Weeks   Status Achieved   PT SHORT TERM GOAL #2   Title Improve Lt knee flexion AROM to 95 degrees to improve ability to do elliptical    Time 4   Period Weeks   Status Achieved   PT SHORT TERM GOAL #3   Title Improve Lt knee extension AROM to -5 for fluid gait pattern    Time 4   Period Weeks   Status Achieved           PT Long Term Goals - 05/29/15 4166    PT LONG TERM GOAL #1   Title Be independent with advanced HEP    Time 8   Period Weeks   Status On-going   PT LONG TERM GOAL #2   Title Improve FOTO disabiltiy to < or = to 43%   Time 8   Period Weeks   Status On-going   PT LONG TERM GOAL #3   Title Increase LE strength to return to 1 hour cardio workout on elliptical    Time 8   Period Weeks   Status On-going   PT LONG TERM  GOAL #4   Title Improve knee flexion AROM to 105 in order to use riding lawnmower    Time 8  Period Weeks   Status On-going   PT LONG TERM GOAL #5   Title wean from the cane to no device for community distances > or = to 75% of the time   Time 8   Period Weeks   Status Achieved   PT LONG TERM GOAL #6   Title improve Lt knee AROM and strength to descend steps with 50% increased ease   Time 8   Period Weeks   Status On-going               Plan - 06/02/15 1111    Clinical Impression Statement Pt continues to improve with endurance and strength, eccentric control left LE is still weak. Pt will continue to benefit from skilled PT.   Pt will benefit from skilled therapeutic intervention in order to improve on the following deficits Decreased range of motion;Difficulty walking;Decreased endurance;Decreased activity tolerance;Pain;Decreased scar mobility;Increased edema;Decreased strength   Rehab Potential Good   PT Frequency 3x / week   PT Duration 6 weeks   PT Treatment/Interventions Moist Heat;Ultrasound;Vasopneumatic Device;Cryotherapy;Electrical Stimulation;Gait training;Functional mobility training;Therapeutic exercise;Neuromuscular re-education;Patient/family education;Passive range of motion;Energy conservation;Visual/perceptual remediation/compensation   PT Next Visit Plan continue with left LE strength with fous on gastrocnemius and hamstrings, improve ROM into flexion   Consulted and Agree with Plan of Care Patient        Problem List Patient Active Problem List   Diagnosis Date Noted  . Chronic systolic CHF (congestive heart failure)   . Nonischemic cardiomyopathy   . Type II diabetes mellitus   . Cardiomyopathy   . Aspiration pneumonia 04/22/2015  . Elevated troponin 04/21/2015  . Altered mental status   . Status epilepticus 04/19/2015  . Altered consciousness   . Acute respiratory failure   . Seizure-like activity   . Acute encephalopathy   . Failed total  knee arthroplasty 01/25/2015  . OA (osteoarthritis) of knee 01/25/2015  . Essential tremor 03/09/2014  . Facial spasm 04/14/2013  . Arthritis   . Depression   . Hypertension   . Prostate cancer, primary, with metastasis from prostate to other site   . PONV (postoperative nausea and vomiting)   . De Quervain's tenosynovitis, left 04/13/2012    NAUMANN-HOUEGNIFIO,Audra Kagel PTA 06/02/2015, 11:14 AM  [facility] [address] [place], [place], [address] Phone: [phone]   Fax:  [phone]

## 2015-06-05 ENCOUNTER — Encounter: Payer: Self-pay | Admitting: Physical Therapy

## 2015-06-05 ENCOUNTER — Ambulatory Visit: Payer: 59 | Admitting: Physical Therapy

## 2015-06-05 DIAGNOSIS — M25662 Stiffness of left knee, not elsewhere classified: Secondary | ICD-10-CM | POA: Diagnosis not present

## 2015-06-05 DIAGNOSIS — M7989 Other specified soft tissue disorders: Secondary | ICD-10-CM

## 2015-06-05 DIAGNOSIS — R29898 Other symptoms and signs involving the musculoskeletal system: Secondary | ICD-10-CM

## 2015-06-05 NOTE — Therapy (Signed)
[facility] [address] [place], [place], [address] Phone: [phone]   Fax:  [phone]  Physical Therapy Treatment  Patient Details  Name: Dale Morrow MRN: [id number] Date of Birth: 03-18-1941 Referring Provider:  Gaynelle Arabian, MD  Encounter Date: 06/05/2015      PT End of Session - 06/05/15 1020    Visit Number 18   Number of Visits 20   Date for PT Re-Evaluation 07/03/15   PT Start Time 0925   PT Stop Time 1032   PT Time Calculation (min) 67 min   Activity Tolerance Patient tolerated treatment well   Behavior During Therapy [facility] for tasks assessed/performed      Past Medical History  Diagnosis Date  . Arthritis     left knee  . Depression   . De Quervain's tenosynovitis, left 04/2012  . Tremors of nervous system     takes propranolol for tremors  . Prostate cancer, primary, with metastasis from prostate to other site     a. metastasis to lymph nodes;  b. s/p XRT.  [name] Complication of anesthesia     pt denies ponv, pt woke up during colonscopy in past  . Diabetes mellitus     Insulin pump, takes lisinopril for kidneys, dr Buddy Duty endocrinologist  . Left-sided Bell's palsy   . Right-sided Bell's palsy   . Nonischemic cardiomyopathy     a. 04/2015 Echo: EF 25-30%, mid apical, antseptal, anterolat, inflat, infsept, apical AK. ? takotsubo. No thrombus. PASP 67mmHg;  b. 04/2014 Lexi MV: EF 29%, fixed mod size and intensity mid-dist ant, apical, inferior scar w/ wma's;  c. 04/2015 Cath: LM nl, LAD 50p/m, LCX 25p, RI nl, RCA nl, RPAV 50d-->Med Rx.  . Chronic systolic CHF (congestive heart failure)     a. 04/2015 Echo: EF 25-30%.    Past Surgical History  Procedure Laterality Date  . Hemorrhoid surgery  1970s  . Anterior cruciate ligament repair      right knee  . Wrist arthroscopy  05/03/2010    right; and release of 1st dorsal compartment  . Knee arthrotomy  03/18/2008    left; with scar exc.  [name] Knee  arthroscopy  09/25/2007; 04/15/2005    left  . Knee joint manipulation  06/29/2007    left  . Total knee arthroplasty  04/29/2007    left  . Dorsal compartment release  04/21/2012    Procedure: RELEASE DORSAL COMPARTMENT (DEQUERVAIN);  Surgeon: Cammie Sickle., MD;  Location: [facility];  Service: Orthopedics;  Laterality: Left;  left 1st dorsal compartment release left wrist   . Minor amputation of digit Left 07/21/2013    Procedure: LEFT SMALL REVISION AMPUTATION (MINOR PROCEDURE) ;  Surgeon: Schuyler Amor, MD;  Location: [place];  Service: Orthopedics;  Laterality: Left;  . Colonoscopy with propofol N/A 04/07/2014    Procedure: COLONOSCOPY WITH PROPOFOL;  Surgeon: Jeryl Columbia, MD;  Location: [facility];  Service: Endoscopy;  Laterality: N/A;  . Total knee revision Left 01/25/2015    Procedure: LEFT TOTAL KNEE  ARTHOPLASTY REVISION;  Surgeon: Gaynelle Arabian, MD;  Location: [facility];  Service: Orthopedics;  Laterality: Left;  . Cardiac catheterization N/A 04/26/2015    Procedure: Left Heart Cath and Coronary Angiography;  Surgeon: Jettie Booze, MD;  Location: [place] [facility];  Service: Cardiovascular;  Laterality: N/A;  . Cardiac catheterization  04/26/2015    Procedure: Intravascular Ultrasound/IVUS;  Surgeon: Charlann Lange  Irish Lack, MD;  Location: [place] [facility];  Service: Cardiovascular;;    There were no vitals filed for this visit.  Visit Diagnosis:  Knee stiffness, left  Weakness of left lower extremity  Swelling of limb      Subjective Assessment - 06/05/15 1016    Subjective Pt with tightness along patella tendong and soreness in back of knee with increased walking distance   Currently in Pain? Yes   Pain Score 3    Pain Location Knee   Pain Orientation Left   Pain Descriptors / Indicators Aching   Pain Type Chronic pain   Pain Onset More than a month ago   Pain Frequency Intermittent   Multiple Pain Sites No                          [facility] Adult PT Treatment/Exercise - 06/05/15 0001    Lumbar Exercises: Machines for Strengthening   Leg Press ST #6 95# 3x 10, 45# Lt leg only  may incre weight next visit   Knee/Hip Exercises: Aerobic   Stationary Bike Full fwd ROM L2 x 15 min   Tread Mill 16min 2.2 mpH tolerated well   Knee/Hip Exercises: Standing   Lateral Step Up Left;Step Height: 8";3 sets;Hand Hold: 1   Forward Step Up Left;3 sets;10 reps;Hand Hold: 1;Step Height: 8"   SLS 3 x 20 sec on Lt knee  with modification at times to maintain balance   Vasopneumatic   Number Minutes Vasopneumatic  15 minutes   Vasopnuematic Location  Knee   Vasopneumatic Pressure High   Vasopneumatic Temperature  3 snowflakes   Manual Therapy   Manual Therapy Joint mobilization;Soft tissue mobilization  to left knee to incr flexion                  PT Short Term Goals - 05/15/15 4854    PT SHORT TERM GOAL #1   Title Independent with HEP    Time 4   Period Weeks   Status Achieved   PT SHORT TERM GOAL #2   Title Improve Lt knee flexion AROM to 95 degrees to improve ability to do elliptical    Time 4   Period Weeks   Status Achieved   PT SHORT TERM GOAL #3   Title Improve Lt knee extension AROM to -5 for fluid gait pattern    Time 4   Period Weeks   Status Achieved           PT Long Term Goals - 06/05/15 1025    PT LONG TERM GOAL #1   Title Be independent with advanced HEP    Time 8   Period Weeks   Status On-going   PT LONG TERM GOAL #3   Title Increase LE strength to return to 1 hour cardio workout on elliptical    Time 8   Period Weeks   Status On-going   PT LONG TERM GOAL #4   Title Improve knee flexion AROM to 105 in order to use riding lawnmower    Time 8   Period Weeks   Status On-going   PT LONG TERM GOAL #5   Title wean from the cane to no device for community distances > or = to 75% of the time   Time 8   Status Achieved   PT LONG TERM GOAL #6    Title improve Lt knee AROM and strength to descend steps with 50% increased ease  Time 8   Period Weeks   Status On-going               Plan - 06/05/15 1024    Clinical Impression Statement pt continues to improve with endurance and strength. PTA noticed improvment with gait. Ecentric control and SLS is still challenging. Pt will continue to benefit from skilled PT   Pt will benefit from skilled therapeutic intervention in order to improve on the following deficits Decreased range of motion;Difficulty walking;Decreased endurance;Decreased activity tolerance;Pain;Decreased scar mobility;Increased edema;Decreased strength   Rehab Potential Good   PT Frequency 3x / week   PT Duration 6 weeks   PT Treatment/Interventions Moist Heat;Ultrasound;Vasopneumatic Device;Cryotherapy;Electrical Stimulation;Gait training;Functional mobility training;Therapeutic exercise;Neuromuscular re-education;Patient/family education;Passive range of motion;Energy conservation;Visual/perceptual remediation/compensation   PT Next Visit Plan continue with left LE strength with fous on gastrocnemius and hamstrings, improve ROM into flexion   PT Home Exercise Plan current HEP   Consulted and Agree with Plan of Care Patient        Problem List Patient Active Problem List   Diagnosis Date Noted  . Chronic systolic CHF (congestive heart failure)   . Nonischemic cardiomyopathy   . Type II diabetes mellitus   . Cardiomyopathy   . Aspiration pneumonia 04/22/2015  . Elevated troponin 04/21/2015  . Altered mental status   . Status epilepticus 04/19/2015  . Altered consciousness   . Acute respiratory failure   . Seizure-like activity   . Acute encephalopathy   . Failed total knee arthroplasty 01/25/2015  . OA (osteoarthritis) of knee 01/25/2015  . Essential tremor 03/09/2014  . Facial spasm 04/14/2013  . Arthritis   . Depression   . Hypertension   . Prostate cancer, primary, with metastasis from prostate  to other site   . PONV (postoperative nausea and vomiting)   . De Quervain's tenosynovitis, left 04/13/2012    Morrow,Dale Dulski PTA 06/05/2015, 10:27 AM  [facility] [address], [place], [place], [address] Phone: [phone]   Fax:  [phone]

## 2015-06-07 ENCOUNTER — Ambulatory Visit: Payer: 59

## 2015-06-07 DIAGNOSIS — M25662 Stiffness of left knee, not elsewhere classified: Secondary | ICD-10-CM

## 2015-06-07 DIAGNOSIS — R29898 Other symptoms and signs involving the musculoskeletal system: Secondary | ICD-10-CM

## 2015-06-07 DIAGNOSIS — M7989 Other specified soft tissue disorders: Secondary | ICD-10-CM

## 2015-06-07 NOTE — Therapy (Signed)
[facility] [address] [place], [place], [address] Phone: [phone]   Fax:  [phone]  Physical Therapy Treatment  Patient Details  Name: Dale Morrow MRN: [id number] Date of Birth: 05-05-1941 Referring Provider:  Gaynelle Arabian, MD  Encounter Date: 06/07/2015      PT End of Session - 06/07/15 1011    Visit Number 19   Number of Visits 20   Date for PT Re-Evaluation 07/03/15   PT Start Time 0925   PT Stop Time 1025   PT Time Calculation (min) 60 min   Activity Tolerance Patient tolerated treatment well   Behavior During Therapy [facility] for tasks assessed/performed      Past Medical History  Diagnosis Date  . Arthritis     left knee  . Depression   . De Quervain's tenosynovitis, left 04/2012  . Tremors of nervous system     takes propranolol for tremors  . Prostate cancer, primary, with metastasis from prostate to other site     a. metastasis to lymph nodes;  b. s/p XRT.  [name] Complication of anesthesia     pt denies ponv, pt woke up during colonscopy in past  . Diabetes mellitus     Insulin pump, takes lisinopril for kidneys, dr Buddy Duty endocrinologist  . Left-sided Bell's palsy   . Right-sided Bell's palsy   . Nonischemic cardiomyopathy     a. 04/2015 Echo: EF 25-30%, mid apical, antseptal, anterolat, inflat, infsept, apical AK. ? takotsubo. No thrombus. PASP 36mmHg;  b. 04/2014 Lexi MV: EF 29%, fixed mod size and intensity mid-dist ant, apical, inferior scar w/ wma's;  c. 04/2015 Cath: LM nl, LAD 50p/m, LCX 25p, RI nl, RCA nl, RPAV 50d-->Med Rx.  . Chronic systolic CHF (congestive heart failure)     a. 04/2015 Echo: EF 25-30%.    Past Surgical History  Procedure Laterality Date  . Hemorrhoid surgery  1970s  . Anterior cruciate ligament repair      right knee  . Wrist arthroscopy  05/03/2010    right; and release of 1st dorsal compartment  . Knee arthrotomy  03/18/2008    left; with scar exc.  [name] Knee  arthroscopy  09/25/2007; 04/15/2005    left  . Knee joint manipulation  06/29/2007    left  . Total knee arthroplasty  04/29/2007    left  . Dorsal compartment release  04/21/2012    Procedure: RELEASE DORSAL COMPARTMENT (DEQUERVAIN);  Surgeon: Cammie Sickle., MD;  Location: [facility];  Service: Orthopedics;  Laterality: Left;  left 1st dorsal compartment release left wrist   . Minor amputation of digit Left 07/21/2013    Procedure: LEFT SMALL REVISION AMPUTATION (MINOR PROCEDURE) ;  Surgeon: Schuyler Amor, MD;  Location: [place];  Service: Orthopedics;  Laterality: Left;  . Colonoscopy with propofol N/A 04/07/2014    Procedure: COLONOSCOPY WITH PROPOFOL;  Surgeon: Jeryl Columbia, MD;  Location: [facility];  Service: Endoscopy;  Laterality: N/A;  . Total knee revision Left 01/25/2015    Procedure: LEFT TOTAL KNEE  ARTHOPLASTY REVISION;  Surgeon: Gaynelle Arabian, MD;  Location: [facility];  Service: Orthopedics;  Laterality: Left;  . Cardiac catheterization N/A 04/26/2015    Procedure: Left Heart Cath and Coronary Angiography;  Surgeon: Jettie Booze, MD;  Location: [place] [facility];  Service: Cardiovascular;  Laterality: N/A;  . Cardiac catheterization  04/26/2015    Procedure: Intravascular Ultrasound/IVUS;  Surgeon: Charlann Lange  Irish Lack, MD;  Location: [place] [facility];  Service: Cardiovascular;;    There were no vitals filed for this visit.  Visit Diagnosis:  Knee stiffness, left  Weakness of left lower extremity  Swelling of limb      Subjective Assessment - 06/07/15 0939    Subjective Feeling good.  Worked in the gym yesterday so mild pain today.  20 minutes on bike and 10 minutes on elliptical.     Currently in Pain? Yes   Pain Score 2    Pain Location Knee   Pain Orientation Left   Pain Descriptors / Indicators Aching   Pain Type Chronic pain                         [facility] Adult PT Treatment/Exercise - 06/07/15 0001     Lumbar Exercises: Machines for Strengthening   Leg Press ST #6 100# 3x 10, 50# Lt leg only   Knee/Hip Exercises: Aerobic   Stationary Bike Full fwd ROM L2 x 10 min   Tread Mill 48min 2.2 mpH tolerated well   Knee/Hip Exercises: Standing   Lateral Step Up Left;Step Height: 8";3 sets;Hand Hold: 1   Forward Step Up Left;3 sets;10 reps;Hand Hold: 1;Step Height: 8"   Walking with Sports Cord 30# forward/reverse backward/reverse x10   Vasopneumatic   Number Minutes Vasopneumatic  15 minutes   Vasopnuematic Location  Knee   Vasopneumatic Pressure High   Vasopneumatic Temperature  3 snowflakes                  PT Short Term Goals - 05/15/15 0814    PT SHORT TERM GOAL #1   Title Independent with HEP    Time 4   Period Weeks   Status Achieved   PT SHORT TERM GOAL #2   Title Improve Lt knee flexion AROM to 95 degrees to improve ability to do elliptical    Time 4   Period Weeks   Status Achieved   PT SHORT TERM GOAL #3   Title Improve Lt knee extension AROM to -5 for fluid gait pattern    Time 4   Period Weeks   Status Achieved           PT Long Term Goals - 06/05/15 1025    PT LONG TERM GOAL #1   Title Be independent with advanced HEP    Time 8   Period Weeks   Status On-going   PT LONG TERM GOAL #3   Title Increase LE strength to return to 1 hour cardio workout on elliptical    Time 8   Period Weeks   Status On-going   PT LONG TERM GOAL #4   Title Improve knee flexion AROM to 105 in order to use riding lawnmower    Time 8   Period Weeks   Status On-going   PT LONG TERM GOAL #5   Title wean from the cane to no device for community distances > or = to 75% of the time   Time 8   Status Achieved   PT LONG TERM GOAL #6   Title improve Lt knee AROM and strength to descend steps with 50% increased ease   Time 8   Period Weeks   Status On-going               Plan - 06/07/15 1001    Clinical Impression Statement Pt is independent in gym exercises  and is going  to the gym daily.  Pt will be discharged next session to HEP.     Pt will benefit from skilled therapeutic intervention in order to improve on the following deficits Decreased range of motion;Difficulty walking;Decreased endurance;Decreased activity tolerance;Pain;Decreased scar mobility;Increased edema;Decreased strength   Rehab Potential Good   PT Frequency 3x / week   PT Duration 6 weeks   PT Treatment/Interventions Moist Heat;Ultrasound;Vasopneumatic Device;Cryotherapy;Electrical Stimulation;Gait training;Functional mobility training;Therapeutic exercise;Neuromuscular re-education;Patient/family education;Passive range of motion;Energy conservation;Visual/perceptual remediation/compensation   PT Next Visit Plan 1 more session.  D/C PT to HEP.  G-codes and FOTO.     Consulted and Agree with Plan of Care Patient        Problem List Patient Active Problem List   Diagnosis Date Noted  . Chronic systolic CHF (congestive heart failure)   . Nonischemic cardiomyopathy   . Type II diabetes mellitus   . Cardiomyopathy   . Aspiration pneumonia 04/22/2015  . Elevated troponin 04/21/2015  . Altered mental status   . Status epilepticus 04/19/2015  . Altered consciousness   . Acute respiratory failure   . Seizure-like activity   . Acute encephalopathy   . Failed total knee arthroplasty 01/25/2015  . OA (osteoarthritis) of knee 01/25/2015  . Essential tremor 03/09/2014  . Facial spasm 04/14/2013  . Arthritis   . Depression   . Hypertension   . Prostate cancer, primary, with metastasis from prostate to other site   . PONV (postoperative nausea and vomiting)   . De Quervain's tenosynovitis, left 04/13/2012    Warden Buffa, PT 06/07/2015, 10:12 AM  [facility] [address] [place], [place], [address] Phone: [phone]   Fax:  [phone]

## 2015-06-09 ENCOUNTER — Ambulatory Visit: Payer: 59 | Admitting: Physical Therapy

## 2015-06-09 ENCOUNTER — Encounter: Payer: Self-pay | Admitting: Physical Therapy

## 2015-06-09 DIAGNOSIS — M25662 Stiffness of left knee, not elsewhere classified: Secondary | ICD-10-CM | POA: Diagnosis not present

## 2015-06-09 DIAGNOSIS — M7989 Other specified soft tissue disorders: Secondary | ICD-10-CM

## 2015-06-09 DIAGNOSIS — R29898 Other symptoms and signs involving the musculoskeletal system: Secondary | ICD-10-CM

## 2015-06-09 NOTE — Therapy (Signed)
[facility] [address] [place], [place], [address] Phone: [phone]   Fax:  [phone]  Physical Therapy Treatment  Patient Details  Name: Dale Morrow MRN: [id number] Date of Birth: Mar 03, 1941 Referring Provider:  Gaynelle Arabian, MD  Encounter Date: 06/09/2015      PT End of Session - 06/09/15 0948    Visit Number 20   Number of Visits 20   Date for PT Re-Evaluation 07/03/15   PT Start Time 0927   PT Stop Time 1030   PT Time Calculation (min) 63 min   Activity Tolerance Patient tolerated treatment well   Behavior During Therapy [facility] for tasks assessed/performed      Past Medical History  Diagnosis Date  . Arthritis     left knee  . Depression   . De Quervain's tenosynovitis, left 04/2012  . Tremors of nervous system     takes propranolol for tremors  . Prostate cancer, primary, with metastasis from prostate to other site     a. metastasis to lymph nodes;  b. s/p XRT.  [name] Complication of anesthesia     pt denies ponv, pt woke up during colonscopy in past  . Diabetes mellitus     Insulin pump, takes lisinopril for kidneys, dr Buddy Duty endocrinologist  . Left-sided Bell's palsy   . Right-sided Bell's palsy   . Nonischemic cardiomyopathy     a. 04/2015 Echo: EF 25-30%, mid apical, antseptal, anterolat, inflat, infsept, apical AK. ? takotsubo. No thrombus. PASP 23mHg;  b. 04/2014 Lexi MV: EF 29%, fixed mod size and intensity mid-dist ant, apical, inferior scar w/ wma's;  c. 04/2015 Cath: LM nl, LAD 50p/m, LCX 25p, RI nl, RCA nl, RPAV 50d-->Med Rx.  . Chronic systolic CHF (congestive heart failure)     a. 04/2015 Echo: EF 25-30%.    Past Surgical History  Procedure Laterality Date  . Hemorrhoid surgery  1970s  . Anterior cruciate ligament repair      right knee  . Wrist arthroscopy  05/03/2010    right; and release of 1st dorsal compartment  . Knee arthrotomy  03/18/2008    left; with scar exc.  .Marland KitchenKnee  arthroscopy  09/25/2007; 04/15/2005    left  . Knee joint manipulation  06/29/2007    left  . Total knee arthroplasty  04/29/2007    left  . Dorsal compartment release  04/21/2012    Procedure: RELEASE DORSAL COMPARTMENT (DEQUERVAIN);  Surgeon: RCammie Sickle, MD;  [facility]  Service: Orthopedics;  Laterality: Left;  left 1st dorsal compartment release left wrist   . Minor amputation of digit Left 07/21/2013    Procedure: LEFT SMALL REVISION AMPUTATION (MINOR PROCEDURE) ;  Surgeon: MSchuyler Amor MD;  [facility]  Service: Orthopedics;  Laterality: Left;  . Colonoscopy with propofol N/A 04/07/2014    Procedure: COLONOSCOPY WITH PROPOFOL;  Surgeon: MJeryl Columbia MD;  Location: [facility];  Service: Endoscopy;  Laterality: N/A;  . Total knee revision Left 01/25/2015    Procedure: LEFT TOTAL KNEE  ARTHOPLASTY REVISION;  Surgeon: FGaynelle Arabian MD;  Location: [facility];  Service: Orthopedics;  Laterality: Left;  . Cardiac catheterization N/A 04/26/2015    Procedure: Left Heart Cath and Coronary Angiography;  Surgeon: JJettie Booze MD;  [facility];  Service: Cardiovascular;  Laterality: N/A;  . Cardiac catheterization  04/26/2015    Procedure: Intravascular Ultrasound/IVUS;  Surgeon: JCharlann Lange  Irish Lack, MD;  Location: [facility];  Service: Cardiovascular;;    There were no vitals filed for this visit.  Visit Diagnosis:  Knee stiffness, left  Weakness of left lower extremity  Swelling of limb      Subjective Assessment - 06/09/15 0940    Subjective Pt feels great with Left knee, but notices the endurance and stamina is still limited he is ready for D/C, and will continue to work out at gym.   How long can you stand comfortably? 25-30 minutes before need to sit down   How long can you walk comfortably? 51mn before need to sit down   Currently in Pain? Yes   Pain Score 2    Pain Location Knee   Pain  Orientation Left   Pain Descriptors / Indicators Aching   Pain Type Chronic pain   Pain Onset More than a month ago   Pain Frequency Intermittent   Multiple Pain Sites No            [facility] PT Assessment - 06/09/15 0001    Assessment   Medical Diagnosis Z96.652 - Status post revision of total replacement of the knee    Onset Date/Surgical Date 01/26/15   Next MD Visit Oct   Restrictions   Weight Bearing Restrictions No   Balance Screen   Has the patient fallen in the past 6 months No   Has the patient had a decrease in activity level because of a fear of falling?  No   Is the patient reluctant to leave their home because of a fear of falling?  No   Home Environment   Living Environment Private residence   Living Arrangements Spouse/significant other   Type of HMelroseto enter   Entrance Stairs-Number of Steps Front door: 2 steps; none at garage   HMatthewsTwo level   Alternate Level Stairs-Number of Steps 14; railing on one side    Home Equipment --  using cane only for descending stairs,    Prior Function   Level of Independence Independent   Vocation Retired   Leisure Working out: 1 hour cardio, [place], skiing,     Cognition   Overall Cognitive Status Within Functional Limits for tasks assessed   Observation/Other Assessments   Focus on Therapeutic Outcomes (FOTO)  49% limitation   AROM   Overall AROM  Deficits   Overall AROM Comments 102 degrees flexion, 0 degrees extension   Strength   Overall Strength Deficits   Overall Strength Comments Lt hip 4/5; knee extension: 4/5; knee flexion: 5/5; DF: 5/5    Palpation   Palpation comment Incision length: 8 in.   very well healed   Ambulation/Gait   Ambulation/Gait Yes   Ambulation/Gait Assistance 7: Independent   Stairs Yes   Stair Management Technique Two rails  with descending step thru pattern   Gait Comments pt with good steplength and evenly weightbearing                      [facility] Adult PT Treatment/Exercise - 06/09/15 0001    Lumbar Exercises: Machines for Strengthening   Leg Press ST #6 100# 3x 10, 50# Lt leg only   Knee/Hip Exercises: Aerobic   Stationary Bike Full fwd ROM L2 x 10 min   Knee/Hip Exercises: Standing   Lateral Step Up Left;Step Height: 8";3 sets;Hand Hold: 1   Forward Step Up Left;3 sets;10 reps;Hand Hold: 1;Step  Height: 8"   Step Down 3 sets;Hand Hold: 1;Step Height: 6"  3x10   Walking with Sports Cord 30# forward/reverse backward/reverse x10, sidestepping 25# each side   Vasopneumatic   Number Minutes Vasopneumatic  15 minutes   Vasopnuematic Location  Knee   Vasopneumatic Pressure High   Vasopneumatic Temperature  3 snowflakes                  PT Short Term Goals - 05/15/15 2956    PT SHORT TERM GOAL #1   Title Independent with HEP    Time 4   Period Weeks   Status Achieved   PT SHORT TERM GOAL #2   Title Improve Lt knee flexion AROM to 95 degrees to improve ability to do elliptical    Time 4   Period Weeks   Status Achieved   PT SHORT TERM GOAL #3   Title Improve Lt knee extension AROM to -5 for fluid gait pattern    Time 4   Period Weeks   Status Achieved           PT Long Term Goals - 06/09/15 2130    PT LONG TERM GOAL #1   Title Be independent with advanced HEP    Time 8   Period Weeks   Status Achieved   PT LONG TERM GOAL #2   Title Improve FOTO disabiltiy to < or = to 43%  49%   Time 8   Period Weeks   Status Partially Met   PT LONG TERM GOAL #3   Title Increase LE strength to return to 1 hour cardio workout on elliptical   able to perform 19mn   Time 8   Period Weeks   Status Partially Met   PT LONG TERM GOAL #4   Title Improve knee flexion AROM to 105 in order to use riding lawnmower   103   Time 8   Period Weeks   Status Partially Met   PT LONG TERM GOAL #5   Title wean from the cane to no device for community distances > or = to 75% of the time    Time 8   Period Weeks   Status Achieved   PT LONG TERM GOAL #6   Title improve Lt knee AROM and strength to descend steps with 50% increased ease   Time 8   Period Weeks   Status Achieved               Plan - 06/09/15 0950    Clinical Impression Statement Pt tolerates activities well in PT. AROM left knee in degrees: flexion 103, extension 0, strength quadriceps 5/5, hamstrings 5/5. Still slighly swelling suprapatellar   Pt will benefit from skilled therapeutic intervention in order to improve on the following deficits Decreased range of motion;Difficulty walking;Decreased endurance;Decreased activity tolerance;Pain;Decreased scar mobility;Increased edema;Decreased strength   Rehab Potential Good   PT Frequency 3x / week   PT Duration 6 weeks   Consulted and Agree with Plan of Care Patient        Problem List Patient Active Problem List   Diagnosis Date Noted  . Chronic systolic CHF (congestive heart failure)   . Nonischemic cardiomyopathy   . Type II diabetes mellitus   . Cardiomyopathy   . Aspiration pneumonia 04/22/2015  . Elevated troponin 04/21/2015  . Altered mental status   . Status epilepticus 04/19/2015  . Altered consciousness   . Acute respiratory failure   . Seizure-like activity   .  Acute encephalopathy   . Failed total knee arthroplasty 01/25/2015  . OA (osteoarthritis) of knee 01/25/2015  . Essential tremor 03/09/2014  . Facial spasm 04/14/2013  . Arthritis   . Depression   . Hypertension   . Prostate cancer, primary, with metastasis from prostate to other site   . PONV (postoperative nausea and vomiting)   . De Quervain's tenosynovitis, left 04/13/2012     Olympia Adelsberger Naumann-Houegnifio, PTA 06/09/2015 10:21 AM   [facility] [address] [place], [place], [address] Phone: [phone]   Fax:  [phone]     PHYSICAL THERAPY DISCHARGE SUMMARY  Visits from Start of Care:  20  Current functional level related to goals / functional outcomes: See above. LTG not met due to FOTO is 49% limitation instead of 43%.  LTG # 3 partially met due to performing 10 min cardio instead of 1 hour. LTG # 4 partially met due to knee flexion AROM is 103 instead of 105.    Remaining deficits: See above   Education / Equipment: HEP Plan: Patient agrees to discharge.  Patient goals were partially met. Patient is being discharged due to being pleased with the current functional level.  Thank you for the referral. Earlie Counts, PT 06/09/2015 11:34 AM  ?????

## 2015-06-12 ENCOUNTER — Ambulatory Visit: Payer: Medicare Other | Admitting: Physician Assistant

## 2015-06-21 ENCOUNTER — Encounter: Payer: Self-pay | Admitting: Cardiology

## 2015-06-21 ENCOUNTER — Telehealth: Payer: Self-pay | Admitting: *Deleted

## 2015-06-21 ENCOUNTER — Ambulatory Visit (INDEPENDENT_AMBULATORY_CARE_PROVIDER_SITE_OTHER): Payer: 59 | Admitting: Cardiology

## 2015-06-21 VITALS — BP 111/50 | HR 64 | Ht 68.0 in | Wt 177.0 lb

## 2015-06-21 DIAGNOSIS — I5181 Takotsubo syndrome: Secondary | ICD-10-CM | POA: Diagnosis not present

## 2015-06-21 DIAGNOSIS — I2510 Atherosclerotic heart disease of native coronary artery without angina pectoris: Secondary | ICD-10-CM

## 2015-06-21 MED ORDER — LISINOPRIL 5 MG PO TABS: 2.5000 mg | ORAL_TABLET | Freq: Two times a day (BID) | ORAL | Status: DC

## 2015-06-21 NOTE — Telephone Encounter (Signed)
Called left message. Patient has a prescription for Lisinopril 2.5 mg BID, since patient may have been off his original dose for a while, per [employer] PA. Will try to call patient again in the AM.

## 2015-06-21 NOTE — Progress Notes (Signed)
06/21/2015 Dale Morrow   28-Jun-1941  [id number]  Primary Physician Dale Morrow, Dale Leaver, MD Primary Cardiologist: Dr. Harrington Morrow   Reason for Visit/CC: Follow-up for Nonischemic cardiomyopathy/ Takotsubo  HPI:  The patient is a 74 year old male who presents to clinic today for follow-up regarding nonischemic cardiomyopathy. He was recently admitted in July of this year after being found to be unresponsive and hypoglycemic with a blood glucose of 32. He required intubation in the emergency department and there was some question of seizure-like activity. He was seen by neurology with extensive workup and no significant findings. His troponin was mildly elevated and this led to an echocardiogram which showed an EF of 25-30% with multiple wall motion abnormalities, possibly suggestive of a takotsubo cardiomyopathy. Stress testing was performed and this also showed LV dysfunction with an EF of 29% and fixed defects and scarring. As a result, cardiac catheterization was performed on July 13 revealing moderate nonobstructive LAD disease. Medical therapy was recommended including beta blocker and ACE inhibitor therapy for LV dysfunction. He was initially discharged to a SNF before being discharged home. He had a follow-up echocardiogram performed 05/25/2015 which showed improvement in left ventricular systolic function with ejection fraction back to normal range at 60-65%.  He presents back to clinic today for follow-up. He is accompanied by his wife. He reports that he has done well since his last OV. He denies any chest pain, dyspnea, orthopnea, PND, palpitations, syncope/ near syncope. He reports full medication compliance. He has an insulin pump for his diabetes and denies any recurrence of hypoglycemia.    Current Outpatient Prescriptions  Medication Sig Dispense Refill  . aspirin EC 81 MG tablet Take 81 mg by mouth daily.    [name] CALCIUM-VITAMIN D PO Take 1 tablet by mouth daily at 12 noon.    . carvedilol  (COREG) 3.125 MG tablet Take 1 tablet (3.125 mg total) by mouth 2 (two) times daily with a meal. 60 tablet 5  . dexlansoprazole (DEXILANT) 60 MG capsule Take 60 mg by mouth daily.    [name] escitalopram (LEXAPRO) 20 MG tablet Take 20 mg by mouth daily.    [name] HYDROcodone-acetaminophen (NORCO/VICODIN) 5-325 MG per tablet Take 1 tablet by mouth every 6 (six) hours as needed. And 2 by mouth at 10 pm,Do Not Exceed 4 GM of tylenol in 24 hours 90 tablet 0  . lisinopril (PRINIVIL,ZESTRIL) 5 MG tablet Take 1 tablet (5 mg total) by mouth 2 (two) times daily. 60 tablet 0  . Multiple Vitamins-Minerals (ONE-A-DAY MENS 50+ ADVANTAGE PO) Take 1 tablet by mouth daily at 12 noon.    . polyethylene glycol powder (GLYCOLAX/MIRALAX) powder Take 17 g by mouth at bedtime.     . primidone (MYSOLINE) 50 MG tablet Take 50 mg by mouth 3 (three) times daily.    . Probiotic Product (ALIGN) 4 MG CAPS Take 4 mg by mouth daily.    . propranolol (INDERAL) 80 MG tablet Take 80 mg by mouth 2 (two) times daily.    . rosuvastatin (CRESTOR) 10 MG tablet Take 10 mg by mouth every morning.      No current facility-administered medications for this visit.    Allergies  Allergen Reactions  . Bee Venom Anaphylaxis  . Shellfish Allergy Anaphylaxis    Social History   Social History  . Marital Status: Married    Spouse Name: Dale Morrow  . Number of Children: o  . Years of Education: college   Occupational History  . retired  Social History Main Topics  . Smoking status: Former Smoker -- 15 years    Types: Cigarettes    Quit date: 10/13/1979  . Smokeless tobacco: Never Used  . Alcohol Use: No  . Drug Use: No  . Sexual Activity: Not on file   Other Topics Concern  . Not on file   Social History Narrative   Patient lives at home with his wife Dale Morrow) and there four dogs.   Retired.   Education two years of college.   Right handed.   Caffeine one cup of coffee daily.     Review of Systems: General: negative for  chills, fever, night sweats or weight changes.  Cardiovascular: negative for chest pain, dyspnea on exertion, edema, orthopnea, palpitations, paroxysmal nocturnal dyspnea or shortness of breath Dermatological: negative for rash Respiratory: negative for cough or wheezing Urologic: negative for hematuria Abdominal: negative for nausea, vomiting, diarrhea, bright red blood per rectum, melena, or hematemesis Neurologic: negative for visual changes, syncope, or dizziness All other systems reviewed and are otherwise negative except as noted above.    Blood pressure 111/50, pulse 64, height 5\' 8"  (1.727 m), weight 177 lb (80.287 kg).  General appearance: alert, cooperative and no distress Neck: no carotid bruit and no JVD Lungs: clear to auscultation bilaterally Heart: regular rate and rhythm, S1, S2 normal, no murmur, click, rub or gallop Extremities: no LEE Pulses: 2+ and symmetric Skin: warm and dry Neurologic: Grossly normal  EKG not performed  ASSESSMENT AND PLAN:   1. Nonischemic cardiomyopathy/Takotsubo: EF has recovered back to normal, now at 60-65% (previously 25-30%). Suspect Takotsubo was secondary to stress event from severe hypoglycemia. With recovery of EF to > 35%, there is no indication for EP referral. Despite normal LVF, would recommend continuation of ACE-I therapy given his DM, for renal protection. We will also continue his Coreg as well. BP and HR both stable.   2. DM: followed closely by PCP. Has an insulin pump. No recurrence of hypoglycemia.   3. Hyperlipidemia: Continue statin therapy. LDL was 93 on July 13.   PLAN  EF has improved back to normal. No indication for EP referral. Continue current medications. F/u with Dr. Harrington Morrow in 6 months.   Dale Morrow, BRITTAINY PA-C 06/21/2015 2:08 PM

## 2015-06-21 NOTE — Telephone Encounter (Signed)
Patients wife called and stated that patient needs a rx for lisinopril 5mg  bid. She tells me that Dr Justin Mend took him off of this medication, but today at his office visit with Ellen Henri, he was told to restart it. Please advise. Thanks, MI

## 2015-06-21 NOTE — Patient Instructions (Signed)
Medication Instructions:  Your physician recommends that you continue on your current medications as directed. Please refer to the Current Medication list given to you today.  Labwork: NONE  Testing/Procedures: NONE  Follow-Up: Your physician wants you to follow-up in: 6 months with Dr. Harrington Challenger. You will receive a reminder letter in the mail two months in advance. If you don't receive a letter, please call our office to schedule the follow-up appointment.

## 2015-06-22 NOTE — Telephone Encounter (Signed)
Left message to call back  

## 2015-06-22 NOTE — Telephone Encounter (Signed)
Patient's wife called back. Informed her of the prescription and that it has been sent to patient's pharmacy. Patient's wife verbalized understanding.

## 2015-06-26 ENCOUNTER — Other Ambulatory Visit: Payer: Self-pay | Admitting: Orthopedic Surgery

## 2015-06-28 ENCOUNTER — Ambulatory Visit: Payer: 59 | Admitting: Neurology

## 2015-06-28 ENCOUNTER — Encounter (HOSPITAL_BASED_OUTPATIENT_CLINIC_OR_DEPARTMENT_OTHER): Payer: Self-pay | Admitting: *Deleted

## 2015-06-28 NOTE — Progress Notes (Signed)
Chart and cardiac notes reviewed with Dr Kalman Shan, [place] for [facility].

## 2015-06-29 ENCOUNTER — Encounter (HOSPITAL_BASED_OUTPATIENT_CLINIC_OR_DEPARTMENT_OTHER)
Admission: RE | Admit: 2015-06-29 | Discharge: 2015-06-29 | Disposition: A | Discharge status: Home / Self Care | Payer: 59 | Source: Ambulatory Visit | Attending: Orthopedic Surgery | Admitting: Orthopedic Surgery

## 2015-06-29 DIAGNOSIS — Z794 Long term (current) use of insulin: Secondary | ICD-10-CM | POA: Diagnosis not present

## 2015-06-29 DIAGNOSIS — I10 Essential (primary) hypertension: Secondary | ICD-10-CM | POA: Diagnosis not present

## 2015-06-29 DIAGNOSIS — E119 Type 2 diabetes mellitus without complications: Secondary | ICD-10-CM | POA: Diagnosis not present

## 2015-06-29 DIAGNOSIS — G5602 Carpal tunnel syndrome, left upper limb: Secondary | ICD-10-CM | POA: Diagnosis present

## 2015-06-29 DIAGNOSIS — Z87891 Personal history of nicotine dependence: Secondary | ICD-10-CM | POA: Diagnosis not present

## 2015-06-29 DIAGNOSIS — G5622 Lesion of ulnar nerve, left upper limb: Secondary | ICD-10-CM | POA: Diagnosis not present

## 2015-06-29 LAB — BASIC METABOLIC PANEL
Anion gap: 6 (ref 5–15)
BUN: 11 mg/dL (ref 6–20)
CO2: 31 mmol/L (ref 22–32)
CREATININE: 0.72 mg/dL (ref 0.61–1.24)
Calcium: 8.8 mg/dL — ABNORMAL LOW (ref 8.9–10.3)
Chloride: 97 mmol/L — ABNORMAL LOW (ref 101–111)
GFR calc Af Amer: >60 mL/min (ref >60)
GLUCOSE: 291 mg/dL — AB (ref 65–99)
POTASSIUM: 4.4 mmol/L (ref 3.5–5.1)
SODIUM: 134 mmol/L — AB (ref 135–145)

## 2015-07-05 ENCOUNTER — Ambulatory Visit (HOSPITAL_BASED_OUTPATIENT_CLINIC_OR_DEPARTMENT_OTHER): Payer: 59 | Admitting: Anesthesiology

## 2015-07-05 ENCOUNTER — Encounter (HOSPITAL_BASED_OUTPATIENT_CLINIC_OR_DEPARTMENT_OTHER): Payer: Self-pay | Admitting: Anesthesiology

## 2015-07-05 ENCOUNTER — Encounter (HOSPITAL_BASED_OUTPATIENT_CLINIC_OR_DEPARTMENT_OTHER)
Admission: RE | Disposition: A | Discharge status: Home / Self Care | Payer: Self-pay | Source: Ambulatory Visit | Attending: Orthopedic Surgery

## 2015-07-05 ENCOUNTER — Ambulatory Visit (HOSPITAL_BASED_OUTPATIENT_CLINIC_OR_DEPARTMENT_OTHER)
Admission: RE | Admit: 2015-07-05 | Discharge: 2015-07-05 | Disposition: A | Discharge status: Home / Self Care | Payer: 59 | Source: Ambulatory Visit | Attending: Orthopedic Surgery | Admitting: Orthopedic Surgery

## 2015-07-05 DIAGNOSIS — I10 Essential (primary) hypertension: Secondary | ICD-10-CM | POA: Insufficient documentation

## 2015-07-05 DIAGNOSIS — Z87891 Personal history of nicotine dependence: Secondary | ICD-10-CM | POA: Insufficient documentation

## 2015-07-05 DIAGNOSIS — Z794 Long term (current) use of insulin: Secondary | ICD-10-CM | POA: Insufficient documentation

## 2015-07-05 DIAGNOSIS — G5602 Carpal tunnel syndrome, left upper limb: Secondary | ICD-10-CM | POA: Diagnosis not present

## 2015-07-05 DIAGNOSIS — E119 Type 2 diabetes mellitus without complications: Secondary | ICD-10-CM | POA: Insufficient documentation

## 2015-07-05 DIAGNOSIS — G5622 Lesion of ulnar nerve, left upper limb: Secondary | ICD-10-CM | POA: Insufficient documentation

## 2015-07-05 HISTORY — PX: CARPAL TUNNEL RELEASE: SHX101

## 2015-07-05 HISTORY — PX: ULNAR TUNNEL RELEASE: SHX820

## 2015-07-05 HISTORY — DX: Gastro-esophageal reflux disease without esophagitis: K21.9

## 2015-07-05 LAB — GLUCOSE, CAPILLARY
Glucose-Capillary: 115 mg/dL — ABNORMAL HIGH (ref 65–99)
Glucose-Capillary: 108 mg/dL — ABNORMAL HIGH (ref 65–99)

## 2015-07-05 SURGERY — CARPAL TUNNEL RELEASE
Anesthesia: General | Site: Wrist | Laterality: Left

## 2015-07-05 MED ORDER — FENTANYL CITRATE (PF) 100 MCG/2ML IJ SOLN
INTRAMUSCULAR | Status: AC
  Filled 2015-07-05: qty 4

## 2015-07-05 MED ORDER — DEXAMETHASONE SODIUM PHOSPHATE 10 MG/ML IJ SOLN
INTRAMUSCULAR | Status: DC | PRN
  Administered 2015-07-05: 10 mg via INTRAVENOUS

## 2015-07-05 MED ORDER — CHLORHEXIDINE GLUCONATE 4 % EX LIQD: 60.0000 mL | Freq: Once | CUTANEOUS | Status: DC

## 2015-07-05 MED ORDER — HYDROMORPHONE HCL 1 MG/ML IJ SOLN: 0.2500 mg | INTRAMUSCULAR | Status: DC | PRN

## 2015-07-05 MED ORDER — OXYCODONE HCL 5 MG/5ML PO SOLN: 5.0000 mg | Freq: Once | ORAL | Status: AC | PRN

## 2015-07-05 MED ORDER — GLYCOPYRROLATE 0.2 MG/ML IJ SOLN: 0.2000 mg | Freq: Once | INTRAMUSCULAR | Status: DC | PRN

## 2015-07-05 MED ORDER — SCOPOLAMINE 1 MG/3DAYS TD PT72: 1.0000 | MEDICATED_PATCH | Freq: Once | TRANSDERMAL | Status: DC | PRN

## 2015-07-05 MED ORDER — OXYCODONE HCL 5 MG PO TABS
5.0000 mg | ORAL_TABLET | Freq: Once | ORAL | Status: AC | PRN
  Administered 2015-07-05: 5 mg via ORAL

## 2015-07-05 MED ORDER — BUPIVACAINE HCL (PF) 0.25 % IJ SOLN
INTRAMUSCULAR | Status: DC | PRN
  Administered 2015-07-05: 20 mL

## 2015-07-05 MED ORDER — OXYCODONE-ACETAMINOPHEN 5-325 MG PO TABS: 1.0000 | ORAL_TABLET | ORAL | Status: DC | PRN

## 2015-07-05 MED ORDER — ONDANSETRON HCL 4 MG/2ML IJ SOLN
INTRAMUSCULAR | Status: DC | PRN
  Administered 2015-07-05: 4 mg via INTRAVENOUS

## 2015-07-05 MED ORDER — BUPIVACAINE HCL (PF) 0.25 % IJ SOLN
INTRAMUSCULAR | Status: AC
  Filled 2015-07-05: qty 30

## 2015-07-05 MED ORDER — FENTANYL CITRATE (PF) 100 MCG/2ML IJ SOLN
50.0000 ug | INTRAMUSCULAR | Status: DC | PRN
  Administered 2015-07-05 (×2): 50 ug via INTRAVENOUS

## 2015-07-05 MED ORDER — LIDOCAINE HCL (CARDIAC) 20 MG/ML IV SOLN
INTRAVENOUS | Status: DC | PRN
  Administered 2015-07-05: 80 mg via INTRAVENOUS

## 2015-07-05 MED ORDER — MIDAZOLAM HCL 2 MG/2ML IJ SOLN: 1.0000 mg | INTRAMUSCULAR | Status: DC | PRN

## 2015-07-05 MED ORDER — PROPOFOL 10 MG/ML IV BOLUS
INTRAVENOUS | Status: DC | PRN
  Administered 2015-07-05: 150 mg via INTRAVENOUS
  Administered 2015-07-05: 50 mg via INTRAVENOUS

## 2015-07-05 MED ORDER — LACTATED RINGERS IV SOLN
INTRAVENOUS | Status: DC
  Administered 2015-07-05 (×2): via INTRAVENOUS

## 2015-07-05 MED ORDER — CEFAZOLIN SODIUM-DEXTROSE 2-3 GM-% IV SOLR: 2.0000 g | INTRAVENOUS | Status: DC

## 2015-07-05 MED ORDER — CEFAZOLIN SODIUM-DEXTROSE 2-3 GM-% IV SOLR
INTRAVENOUS | Status: AC
Start: 2015-07-05 — End: 2015-07-05
  Filled 2015-07-05: qty 50

## 2015-07-05 MED ORDER — OXYCODONE HCL 5 MG PO TABS
ORAL_TABLET | ORAL | Status: AC
  Filled 2015-07-05: qty 1

## 2015-07-05 MED ORDER — MEPERIDINE HCL 25 MG/ML IJ SOLN: 6.2500 mg | INTRAMUSCULAR | Status: DC | PRN

## 2015-07-05 SURGICAL SUPPLY — 54 items
APL SKNCLS STERI-STRIP NONHPOA (GAUZE/BANDAGES/DRESSINGS) ×2
BANDAGE ELASTIC 4 VELCRO ST LF (GAUZE/BANDAGES/DRESSINGS) ×4 IMPLANT
BENZOIN TINCTURE PRP APPL 2/3 (GAUZE/BANDAGES/DRESSINGS) ×4 IMPLANT
BLADE SURG 15 STRL LF DISP TIS (BLADE) ×2 IMPLANT
BLADE SURG 15 STRL SS (BLADE) ×4
BNDG CMPR 9X4 STRL LF SNTH (GAUZE/BANDAGES/DRESSINGS) ×2
BNDG ESMARK 4X9 LF (GAUZE/BANDAGES/DRESSINGS) ×4 IMPLANT
BNDG GAUZE ELAST 4 BULKY (GAUZE/BANDAGES/DRESSINGS) ×4 IMPLANT
CLOSURE WOUND 1/2 X4 (GAUZE/BANDAGES/DRESSINGS) ×1
CORDS BIPOLAR (ELECTRODE) ×4 IMPLANT
COVER BACK TABLE 60X90IN (DRAPES) ×4 IMPLANT
CUFF TOURN SGL LL 18 NRW (TOURNIQUET CUFF) IMPLANT
CUFF TOURNIQUET SINGLE 18IN (TOURNIQUET CUFF) IMPLANT
CUFF TOURNIQUET SINGLE 24IN (TOURNIQUET CUFF) IMPLANT
DECANTER SPIKE VIAL GLASS SM (MISCELLANEOUS) IMPLANT
DRAPE EXTREMITY T 121X128X90 (DRAPE) ×4 IMPLANT
DRAPE SURG 17X23 STRL (DRAPES) ×4 IMPLANT
DRSG PAD ABDOMINAL 8X10 ST (GAUZE/BANDAGES/DRESSINGS) IMPLANT
DURAPREP 26ML APPLICATOR (WOUND CARE) ×4 IMPLANT
GAUZE SPONGE 4X4 12PLY STRL (GAUZE/BANDAGES/DRESSINGS) ×4 IMPLANT
GAUZE SPONGE 4X4 16PLY XRAY LF (GAUZE/BANDAGES/DRESSINGS) IMPLANT
GLOVE SURG SYN 8.0 (GLOVE) ×8 IMPLANT
GLOVE SURG SYN 8.0 PF PI (GLOVE) ×4 IMPLANT
GOWN STRL REUS W/ TWL LRG LVL3 (GOWN DISPOSABLE) ×2 IMPLANT
GOWN STRL REUS W/TWL LRG LVL3 (GOWN DISPOSABLE) ×4
GOWN STRL REUS W/TWL XL LVL3 (GOWN DISPOSABLE) ×8 IMPLANT
LOOP VESSEL MAXI BLUE (MISCELLANEOUS) IMPLANT
NDL HYPO 25X1 1.5 SAFETY (NEEDLE) IMPLANT
NEEDLE HYPO 25X1 1.5 SAFETY (NEEDLE) IMPLANT
NS IRRIG 1000ML POUR BTL (IV SOLUTION) ×4 IMPLANT
PACK BASIN DAY SURGERY FS (CUSTOM PROCEDURE TRAY) ×4 IMPLANT
PAD CAST 3X4 CTTN HI CHSV (CAST SUPPLIES) ×2 IMPLANT
PAD CAST 4YDX4 CTTN HI CHSV (CAST SUPPLIES) IMPLANT
PADDING CAST ABS 4INX4YD NS (CAST SUPPLIES) ×2
PADDING CAST ABS COTTON 4X4 ST (CAST SUPPLIES) ×2 IMPLANT
PADDING CAST COTTON 3X4 STRL (CAST SUPPLIES) ×4
PADDING CAST COTTON 4X4 STRL (CAST SUPPLIES)
SHEET MEDIUM DRAPE 40X70 STRL (DRAPES) ×4 IMPLANT
SLEEVE SCD COMPRESS KNEE MED (MISCELLANEOUS) IMPLANT
SLING ARM LRG ADULT FOAM STRAP (SOFTGOODS) IMPLANT
SLING ARM MED ADULT FOAM STRAP (SOFTGOODS) IMPLANT
STOCKINETTE 4X48 STRL (DRAPES) ×4 IMPLANT
STRIP CLOSURE SKIN 1/2X4 (GAUZE/BANDAGES/DRESSINGS) ×3 IMPLANT
SUT ETHIBOND 2 OS 4 DA (SUTURE) IMPLANT
SUT PROLENE 3 0 PS 2 (SUTURE) ×4 IMPLANT
SUT VIC AB 2-0 SH 27 (SUTURE)
SUT VIC AB 2-0 SH 27XBRD (SUTURE) IMPLANT
SUT VIC AB 3-0 X1 27 (SUTURE) IMPLANT
SUT VICRYL RAPIDE 4-0 (SUTURE) IMPLANT
SUT VICRYL RAPIDE 4/0 PS 2 (SUTURE) IMPLANT
SYR BULB 3OZ (MISCELLANEOUS) ×4 IMPLANT
SYRINGE 10CC LL (SYRINGE) IMPLANT
TOWEL OR 17X24 6PK STRL BLUE (TOWEL DISPOSABLE) ×4 IMPLANT
UNDERPAD 30X30 (UNDERPADS AND DIAPERS) ×4 IMPLANT

## 2015-07-05 NOTE — Discharge Instructions (Signed)

## 2015-07-05 NOTE — Anesthesia Postprocedure Evaluation (Signed)
  Anesthesia Post-op Note  Patient: Dale Morrow  Procedure(s) Performed: Procedure(s): LEFT CARPAL TUNNEL RELEASE (Left) LEFT CUBITAL TUNNEL RELEASE (Left)  Patient Location: PACU  Anesthesia Type: General   Level of Consciousness: awake, alert  and oriented  Airway and Oxygen Therapy: Patient Spontanous Breathing  Post-op Pain: mild  Post-op Assessment: Post-op Vital signs reviewed  Post-op Vital Signs: Reviewed  Last Vitals:  Filed Vitals:   07/05/15 1010  BP:   Pulse: 55  Temp: 36.4 C  Resp: 16    Complications: No apparent anesthesia complications

## 2015-07-05 NOTE — Op Note (Signed)
See note [id number]

## 2015-07-05 NOTE — Transfer of Care (Signed)
Immediate Anesthesia Transfer of Care Note  Patient: Dale Morrow  Procedure(s) Performed: Procedure(s): LEFT CARPAL TUNNEL RELEASE (Left) LEFT CUBITAL TUNNEL RELEASE (Left)  Patient Location: PACU  Anesthesia Type:General  Level of Consciousness: awake, alert  and oriented  Airway & Oxygen Therapy: Patient Spontanous Breathing and Patient connected to face mask oxygen  Post-op Assessment: Report given to RN and Post -op Vital signs reviewed and stable  Post vital signs: Reviewed and stable  Last Vitals:  Filed Vitals:   07/05/15 0731  BP: 139/56  Pulse: 45  Temp: 36.5 C  Resp: 16    Complications: No apparent anesthesia complications

## 2015-07-05 NOTE — Anesthesia Preprocedure Evaluation (Addendum)
Anesthesia Evaluation  Patient identified by MRN, date of birth, ID band Patient awake    Reviewed: Allergy & Precautions, NPO status , Patient's Chart, lab work & pertinent test results  Airway Mallampati: II  TM Distance: >3 FB Neck ROM: Full    Dental  (+) Teeth Intact, Dental Advisory Given   Pulmonary former smoker,    breath sounds clear to auscultation       Cardiovascular hypertension, Pt. on medications + DVT   Rhythm:Regular Rate:Normal     Neuro/Psych    GI/Hepatic GERD  Medicated and Controlled,  Endo/Other  diabetes, Well Controlled, Type 2, Insulin Dependent  Renal/GU      Musculoskeletal   Abdominal   Peds  Hematology   Anesthesia Other Findings   Reproductive/Obstetrics                            Anesthesia Physical Anesthesia Plan  ASA: III  Anesthesia Plan: General   Post-op Pain Management:    Induction: Intravenous  Airway Management Planned: LMA  Additional Equipment:   Intra-op Plan:   Post-operative Plan: Extubation in OR  Informed Consent: I have reviewed the patients History and Physical, chart, labs and discussed the procedure including the risks, benefits and alternatives for the proposed anesthesia with the patient or authorized representative who has indicated his/her understanding and acceptance.   Dental advisory given  Plan Discussed with: CRNA, Anesthesiologist and Surgeon  Anesthesia Plan Comments:         Anesthesia Quick Evaluation

## 2015-07-05 NOTE — H&P (Signed)
Dale Morrow is an 74 y.o. male.   Chief Complaint: left upper extremity numbness in median and ulnar distribution  HPI: as above with positive NCV  Past Medical History  Diagnosis Date  . Arthritis     left knee  . Depression   . De Quervain's tenosynovitis, left 04/2012  . Tremors of nervous system     takes propranolol for tremors  . Prostate cancer, primary, with metastasis from prostate to other site     a. metastasis to lymph nodes;  b. s/p XRT.  [name] Complication of anesthesia     pt denies ponv, pt woke up during colonscopy in past  . Diabetes mellitus     Insulin pump, takes lisinopril for kidneys, dr Buddy Duty endocrinologist  . Left-sided Bell's palsy   . Right-sided Bell's palsy   . Nonischemic cardiomyopathy     a. 04/2015 Echo: EF 25-30%, mid apical, antseptal, anterolat, inflat, infsept, apical AK. ? takotsubo. No thrombus. PASP 31mmHg;  b. 04/2014 Lexi MV: EF 29%, fixed mod size and intensity mid-dist ant, apical, inferior scar w/ wma's;  c. 04/2015 Cath: LM nl, LAD 50p/m, LCX 25p, RI nl, RCA nl, RPAV 50d-->Med Rx.  . Chronic systolic CHF (congestive heart failure)     a. 04/2015 Echo: EF 25-30%.  [name] GERD (gastroesophageal reflux disease)     Past Surgical History  Procedure Laterality Date  . Hemorrhoid surgery  1970s  . Anterior cruciate ligament repair      right knee  . Wrist arthroscopy  05/03/2010    right; and release of 1st dorsal compartment  . Knee arthrotomy  03/18/2008    left; with scar exc.  [name] Knee arthroscopy  09/25/2007; 04/15/2005    left  . Knee joint manipulation  06/29/2007    left  . Total knee arthroplasty  04/29/2007    left  . Dorsal compartment release  04/21/2012    Procedure: RELEASE DORSAL COMPARTMENT (DEQUERVAIN);  Surgeon: Cammie Sickle., MD;  Location: [facility];  Service: Orthopedics;  Laterality: Left;  left 1st dorsal compartment release left wrist   . Minor amputation of digit Left 07/21/2013    Procedure: LEFT SMALL  REVISION AMPUTATION (MINOR PROCEDURE) ;  Surgeon: Schuyler Amor, MD;  Location: [facility];  Service: Orthopedics;  Laterality: Left;  . Colonoscopy with propofol N/A 04/07/2014    Procedure: COLONOSCOPY WITH PROPOFOL;  Surgeon: Jeryl Columbia, MD;  Location: [facility];  Service: Endoscopy;  Laterality: N/A;  . Total knee revision Left 01/25/2015    Procedure: LEFT TOTAL KNEE  ARTHOPLASTY REVISION;  Surgeon: Gaynelle Arabian, MD;  Location: [facility];  Service: Orthopedics;  Laterality: Left;  . Cardiac catheterization N/A 04/26/2015    Procedure: Left Heart Cath and Coronary Angiography;  Surgeon: Jettie Booze, MD;  Location: [facility];  Service: Cardiovascular;  Laterality: N/A;  . Cardiac catheterization  04/26/2015    Procedure: Intravascular Ultrasound/IVUS;  Surgeon: Jettie Booze, MD;  Location: [facility];  Service: Cardiovascular;;    Family History  Problem Relation Age of Onset  . Heart Problems Father   . Heart attack Father   . Hypertension Mother   . Stroke Mother    Social History:  reports that he quit smoking about 35 years ago. His smoking use included Cigarettes. He quit after 15 years of use. He has never used smokeless tobacco. He reports that he does not drink alcohol or use illicit  drugs.  Allergies:  Allergies  Allergen Reactions  . Bee Venom Anaphylaxis  . Shellfish Allergy Anaphylaxis    Medications Prior to Admission  Medication Sig Dispense Refill  . aspirin EC 81 MG tablet Take 81 mg by mouth daily.    [name] CALCIUM-VITAMIN D PO Take 1 tablet by mouth daily at 12 noon.    . carvedilol (COREG) 3.125 MG tablet Take 1 tablet (3.125 mg total) by mouth 2 (two) times daily with a meal. 60 tablet 5  . dexlansoprazole (DEXILANT) 60 MG capsule Take 60 mg by mouth daily.    [name] escitalopram (LEXAPRO) 20 MG tablet Take 20 mg by mouth daily.    [name] lisinopril (PRINIVIL,ZESTRIL) 5 MG tablet Take 0.5 tablets (2.5 mg total) by mouth 2  (two) times daily. 30 tablet 6  . Multiple Vitamins-Minerals (ONE-A-DAY MENS 50+ ADVANTAGE PO) Take 1 tablet by mouth daily at 12 noon.    . primidone (MYSOLINE) 50 MG tablet Take 50 mg by mouth 3 (three) times daily.    . Probiotic Product (ALIGN) 4 MG CAPS Take 4 mg by mouth daily.    . rosuvastatin (CRESTOR) 10 MG tablet Take 10 mg by mouth every morning.     [name] HYDROcodone-acetaminophen (NORCO/VICODIN) 5-325 MG per tablet Take 1 tablet by mouth every 6 (six) hours as needed. And 2 by mouth at 10 pm,Do Not Exceed 4 GM of tylenol in 24 hours 90 tablet 0  . polyethylene glycol powder (GLYCOLAX/MIRALAX) powder Take 17 g by mouth at bedtime.       No results found for this or any previous visit (from the past 48 hour(s)). No results found.  Review of Systems  All other systems reviewed and are negative.   Height 5\' 8"  (1.727 m), weight 80.287 kg (177 lb). Physical Exam  Constitutional: He appears well-developed and well-nourished.  HENT:  Head: Normocephalic and atraumatic.  Cardiovascular: Normal rate.   Respiratory: Effort normal.  Musculoskeletal:       Left elbow: Tenderness found. Medial epicondyle tenderness noted.       Left hand: He exhibits tenderness.  Left CTS with positive tinels and phalens and left cubital tunnel syndrome with tinels as medial aspect of elbow  Skin: Skin is warm.  Psychiatric: He has a normal mood and affect. His behavior is normal. Judgment and thought content normal.     Assessment/Plan As above  Plan left carpal and cubital tunnel releases  WEINGOLD,MATTHEW A 07/05/2015, 7:10 AM

## 2015-07-05 NOTE — Anesthesia Procedure Notes (Signed)
Procedure Name: LMA Insertion Date/Time: 07/05/2015 8:40 AM Performed by: Maryella Shivers Pre-anesthesia Checklist: Patient identified, Emergency Drugs available, Suction available and Patient being monitored Patient Re-evaluated:Patient Re-evaluated prior to inductionOxygen Delivery Method: Circle System Utilized Preoxygenation: Pre-oxygenation with 100% oxygen Intubation Type: IV induction Ventilation: Mask ventilation without difficulty LMA: LMA inserted LMA Size: 5.0 Number of attempts: 2 Airway Equipment and Method: Bite block Placement Confirmation: positive ETCO2 Tube secured with: Tape Dental Injury: Teeth and Oropharynx as per pre-operative assessment

## 2015-07-05 NOTE — Op Note (Signed)
[name], Dale Morrow NO.:  [id number]  MEDICAL RECORD NO.:  [id number]  LOCATION:                               FACILITY:  [facility]  PHYSICIAN:  Sheral Apley. Weingold, M.D.DATE OF BIRTH:  05-16-1941  DATE OF PROCEDURE:  07/05/2015 DATE OF DISCHARGE:  07/05/2015                              OPERATIVE REPORT   PREOPERATIVE DIAGNOSES:  Chronic left carpal and cubital tunnel syndrome.  POSTOPERATIVE DIAGNOSES:  Chronic left carpal and cubital tunnel syndrome.  PROCEDURE:  Left carpal tunnel release and left cubital tunnel release, separate incisions.  SURGEON:  Sheral Apley. Burney Gauze, M.D.  ASSISTANT:  None.  ANESTHESIA:  General.  No complications.  No drains.  DESCRIPTION OF PROCEDURE:  The patient was taken to the operating suite. After the induction of adequate general anesthetic, left upper extremity was prepped and draped in usual sterile fashion.  An Esmarch was used to exsanguinate the limb.  Tourniquet was then inflated to 275 mmHg.  At this point in time, a 2 cm incision was made in the palmar aspect of the left hand in line with the long metacarpals starting at Kaplan's cardinal line.  Skin was incised.  Palmar fascia was identified and split.  The distal edge of the transverse carpal ligament was identified with a 15-blade.  Median nerve was identified and protected with a Soil scientist.  Remaining aspects of the transverse carpal ligament were then divided under direct vision using curved blunt scissors.  Canal was inspected.  There were no osseous lesions or ganglions present.  It was irrigated and loosely closed with 3-0 Prolene subcuticular stitch. Second incision was made on the medial aspect of the left elbow centered between the tip of the olecranon process and medial epicondyle.  Skin incised 3 to 4 cm.  Dissection was carried down the cubital tunnel. This was unroofed.  The ulnar nerve was decompressed proximally under skin bridge  including release of the intermuscular septum off the medial epicondyle.  All pressure points were relieved.  We then dissected the fascia overlying the flexor carpi ulnaris muscles, released this.  The nerve was decompressed distally.  Again all pressure points relieved. The nerve was stable in the groove.  The wound was irrigated and loosely closed in layers of 2-0 undyed Vicryl and a 3-0 Prolene subcuticular stitch on the skin.  Steri-Strips, 4 x 4s, fluffs, and compressive dressings were applied on the hand, wrist, and elbow.  The patient tolerated both procedures well, went to the recovery room in a stable fashion.     Sheral Apley Burney Gauze, M.D.     MAW/MEDQ  D:  07/05/2015  T:  07/05/2015  Job:  [id number]

## 2015-07-06 ENCOUNTER — Encounter (HOSPITAL_BASED_OUTPATIENT_CLINIC_OR_DEPARTMENT_OTHER): Payer: Self-pay | Admitting: Orthopedic Surgery

## 2015-07-24 ENCOUNTER — Encounter: Payer: Self-pay | Admitting: Neurology

## 2015-07-24 ENCOUNTER — Ambulatory Visit (INDEPENDENT_AMBULATORY_CARE_PROVIDER_SITE_OTHER): Payer: 59 | Admitting: Neurology

## 2015-07-24 VITALS — BP 105/60 | HR 53 | Ht 68.0 in | Wt 179.0 lb

## 2015-07-24 DIAGNOSIS — G5139 Clonic hemifacial spasm, unspecified: Secondary | ICD-10-CM

## 2015-07-24 DIAGNOSIS — G250 Essential tremor: Secondary | ICD-10-CM | POA: Diagnosis not present

## 2015-07-24 DIAGNOSIS — G40901 Epilepsy, unspecified, not intractable, with status epilepticus: Secondary | ICD-10-CM

## 2015-07-24 DIAGNOSIS — G513 Clonic hemifacial spasm: Secondary | ICD-10-CM | POA: Diagnosis not present

## 2015-07-24 DIAGNOSIS — I2510 Atherosclerotic heart disease of native coronary artery without angina pectoris: Secondary | ICD-10-CM

## 2015-07-24 MED ORDER — PROPRANOLOL HCL ER 60 MG PO CP24: 60.0000 mg | ORAL_CAPSULE | Freq: Every day | ORAL | Status: DC

## 2015-07-24 NOTE — Progress Notes (Signed)
Chief Complaint  Patient presents with  . Tremors    He is currently on propranolol 80mg  twice daily but is still having problems with tremors in his bilateral hands.  . Insomnia    He is still having problems staying alseep throughout the night.  He has stopped taking Trazodone and Clonazepam.        Dale Morrow is a 74 years old referred by primary care physician for evaluation of tremor and left hemifacial spasm.  He has family history of tremor, mother at her old age,  his sister, maternal uncle all had bilateral hand tremor, he also has past medical history of hypertension, diabetes, insulin pump, hyperlipidemia, prostate cancer, status post radiation therapy history of left knee replacement  He  has a history of bilateral Bell's palsy, with residual left upper and lower face weakness  He noticed bilateral hand shaking since 2004,  difficulty writing, holding utensils, around 2009, he also noticed voice change, there was a tremorous quality to it  He denies loss sense of smell, and there was no REM sleep disorder, no gait difficulty. TSH was normal.   He was placed on propranolol in 2013, with moderate response, with bp at low normal side 100/66, primidone 50mg  qhs was added on since Nov 2013, He reported mild  improvement with primidone 50 mg 2 tablets twice a day.   I saw him regularly in the past for EMG guided xeomin injection since November 2014, he responded very well, last injection was in May of 2015. He had bilateral blephoplasty by [place] ophthalmologist in February 2016,  which has helped his left facial spasm, he also had left knee redo in April 2016, still complains of moderate constant left knee pain, on Dilaudid, oxycodone treatment, is going for rehabilitation, sometimes his left knee is so painful, make him nauseous,  He came back to clinic today complains of worsening bilateral hands tremor, difficulty holding a cup of coffee, holding utensils, cause inconvenient,  social embarrassment, also noticed head titubation, increase voice tremor, needs to repeat himself sometimes.  He is now taking propranolol 60 mg twice a day, primidone 50 mg 2 tablets 3 times a day, propranolol works better for him, blood pressure was in the range of 130-140.  UPDATE Jul 24 2015: He was admitted to the hospital in July 2016 for prolonged seizure due to hypoglycemia, blood glucose was dropped to 30s, EEG that time showed mild background slowing. He is now on insulin pump, also had left carpal tunnel, ulnar decompression surgery, which did help his complains of left hand paresthesia,  He has mild unsteady gait, continue complains of mild bilateral hands tremor, he is taking Inderal 80 mg twice a day, primidone 50 mg 3 times a day  REVIEW OF SYSTEMS: Full 14 system review of systems performed and notable only for abdominal pain, food allergy  ALLERGIES: Allergies  Allergen Reactions  . Bee Venom Anaphylaxis  . Shellfish Allergy Anaphylaxis  . Augmentin [Amoxicillin-Pot Clavulanate] Rash    HOME MEDICATIONS: Current Outpatient Prescriptions  Medication Sig Dispense Refill  . aspirin EC 81 MG tablet Take 81 mg by mouth daily.    [name] CALCIUM-VITAMIN D PO Take 1 tablet by mouth daily at 12 noon.    . carvedilol (COREG) 3.125 MG tablet Take 1 tablet (3.125 mg total) by mouth 2 (two) times daily with a meal. 60 tablet 5  . dexlansoprazole (DEXILANT) 60 MG capsule Take 60 mg by mouth daily.    [name] escitalopram (  LEXAPRO) 20 MG tablet Take 20 mg by mouth daily.    [name] lisinopril (PRINIVIL,ZESTRIL) 2.5 MG tablet 2 (two) times daily.  6  . Multiple Vitamins-Minerals (ONE-A-DAY MENS 50+ ADVANTAGE PO) Take 1 tablet by mouth daily at 12 noon.    [name] oxyCODONE-acetaminophen (ROXICET) 5-325 MG per tablet Take 1 tablet by mouth every 4 (four) hours as needed for severe pain. 30 tablet 0  . polyethylene glycol powder (GLYCOLAX/MIRALAX) powder Take 17 g by mouth at bedtime.     . primidone  (MYSOLINE) 50 MG tablet Take 50 mg by mouth 3 (three) times daily.    . Probiotic Product (ALIGN) 4 MG CAPS Take 4 mg by mouth daily.    . propranolol (INDERAL) 80 MG tablet 2 (two) times daily.  6  . rosuvastatin (CRESTOR) 10 MG tablet Take 10 mg by mouth every morning.      No current facility-administered medications for this visit.    PAST MEDICAL HISTORY: Past Medical History  Diagnosis Date  . Arthritis     left knee  . Depression   . De Quervain's tenosynovitis, left 04/2012  . Tremors of nervous system     takes propranolol for tremors  . Prostate cancer, primary, with metastasis from prostate to other site [facility])     a. metastasis to lymph nodes;  b. s/p XRT.  [name] Complication of anesthesia     pt denies ponv, pt woke up during colonscopy in past  . Diabetes mellitus     Insulin pump, takes lisinopril for kidneys, dr Buddy Duty endocrinologist  . Left-sided Bell's palsy   . Right-sided Bell's palsy   . Nonischemic cardiomyopathy ([place])     a. 04/2015 Echo: EF 25-30%, mid apical, antseptal, anterolat, inflat, infsept, apical AK. ? takotsubo. No thrombus. PASP 32mmHg;  b. 04/2014 Lexi MV: EF 29%, fixed mod size and intensity mid-dist ant, apical, inferior scar w/ wma's;  c. 04/2015 Cath: LM nl, LAD 50p/m, LCX 25p, RI nl, RCA nl, RPAV 50d-->Med Rx.  . Chronic systolic CHF (congestive heart failure) ([place])     a. 04/2015 Echo: EF 25-30%.  [name] GERD (gastroesophageal reflux disease)     PAST SURGICAL HISTORY: Past Surgical History  Procedure Laterality Date  . Hemorrhoid surgery  1970s  . Anterior cruciate ligament repair      right knee  . Wrist arthroscopy  05/03/2010    right; and release of 1st dorsal compartment  . Knee arthrotomy  03/18/2008    left; with scar exc.  [name] Knee arthroscopy  09/25/2007; 04/15/2005    left  . Knee joint manipulation  06/29/2007    left  . Total knee arthroplasty  04/29/2007    left  . Dorsal compartment release  04/21/2012    Procedure: RELEASE DORSAL  COMPARTMENT (DEQUERVAIN);  Surgeon: Cammie Sickle., MD;  Location: [facility];  Service: Orthopedics;  Laterality: Left;  left 1st dorsal compartment release left wrist   . Minor amputation of digit Left 07/21/2013    Procedure: LEFT SMALL REVISION AMPUTATION (MINOR PROCEDURE) ;  Surgeon: Schuyler Amor, MD;  Location: [facility];  Service: Orthopedics;  Laterality: Left;  . Colonoscopy with propofol N/A 04/07/2014    Procedure: COLONOSCOPY WITH PROPOFOL;  Surgeon: Jeryl Columbia, MD;  [facility];  Service: Endoscopy;  Laterality: N/A;  . Total knee revision Left 01/25/2015    Procedure: LEFT TOTAL KNEE  ARTHOPLASTY REVISION;  Surgeon: Gaynelle Arabian, MD;  Location: [facility];  Service: Orthopedics;  Laterality: Left;  . Cardiac catheterization N/A 04/26/2015    Procedure: Left Heart Cath and Coronary Angiography;  Surgeon: Jettie Booze, MD;  Location: [facility];  Service: Cardiovascular;  Laterality: N/A;  . Cardiac catheterization  04/26/2015    Procedure: Intravascular Ultrasound/IVUS;  Surgeon: Jettie Booze, MD;  Location: [facility];  Service: Cardiovascular;;  . Carpal tunnel release Left 07/05/2015    Procedure: LEFT CARPAL TUNNEL RELEASE;  Surgeon: Charlotte Crumb, MD;  Location: [facility];  Service: Orthopedics;  Laterality: Left;  . Ulnar tunnel release Left 07/05/2015    Procedure: LEFT CUBITAL TUNNEL RELEASE;  Surgeon: Charlotte Crumb, MD;  Location: [facility];  Service: Orthopedics;  Laterality: Left;    FAMILY HISTORY: Family History  Problem Relation Age of Onset  . Heart Problems Father   . Heart attack Father   . Hypertension Mother   . Stroke Mother     SOCIAL HISTORY:  Social History   Social History  . Marital Status: Married    Spouse Name: Stella Encarnacion  . Number of Children: o  . Years of Education: college   Occupational History  . retired    Social  History Main Topics  . Smoking status: Former Smoker -- 15 years    Types: Cigarettes    Quit date: 10/13/1979  . Smokeless tobacco: Never Used  . Alcohol Use: No  . Drug Use: No  . Sexual Activity: Not on file   Other Topics Concern  . Not on file   Social History Narrative   Patient lives at home with his wife [name]) and there four dogs.   Retired.   Education two years of college.   Right handed.   Caffeine one cup of coffee daily.     PHYSICAL EXAM   Filed Vitals:   07/24/15 1153  BP: 105/60  Pulse: 53  Height: 5\' 8"  (1.727 m)  Weight: 179 lb (81.194 kg)    Not recorded      Body mass index is 27.22 kg/(m^2).  PHYSICAL EXAMNIATION:  Gen: NAD, conversant, well nourised, obese, well groomed                     Cardiovascular: Regular rate rhythm, no peripheral edema, warm, nontender. Eyes: Conjunctivae clear without exudates or hemorrhage Neck: Supple, no carotid bruise. Pulmonary: Clear to auscultation bilaterally   NEUROLOGICAL EXAM:  MENTAL STATUS: Speech:    Speech is normal; fluent and spontaneous with normal comprehension.  Cognition:    The patient is oriented to person, place, and time;     recent and remote memory intact;     language fluent;     normal attention, concentration,     fund of knowledge.  CRANIAL NERVES: CN II: Visual fields are full to confrontation. Pupils are 4 mm and briskly reactive to light. Visual acuity is 20/20 bilaterally. CN III, IV, VI: extraocular movement are normal. No ptosis. CN V: Facial sensation is intact to pinprick in all 3 divisions bilaterally. Corneal responses are intact.  CN VII: Face is asymmetry, he has mild left eye-closure, cheek puff weakness. CN VIII: Hearing is normal to rubbing fingers CN IX, X: Palate elevates symmetrically. Phonation is normal. CN XI: Head turning and shoulder shrug are intact CN XII: Tongue is midline with normal movements and no atrophy.  MOTOR:  He has bilateral hands  postural tremor,. Muscle bulk and tone are normal.  Muscle strength is normal.   REFLEXES: Reflexes are 2+ and symmetric at the biceps, triceps, knees, and ankles. Plantar responses are flexor.  SENSORY: Light touch, pinprick, position sense, and vibration sense are intact in fingers and toes.  COORDINATION: Rapid alternating movements and fine finger movements are intact. There is no dysmetria on finger-to-nose and heel-knee-shin. There are no abnormal or extraneous movements.   GAIT/STANCE: Antalgic dragging his left leg, cautious  Romberg is absent.   DIAGNOSTIC DATA (LABS, IMAGING, TESTING) - I reviewed patient records, labs, notes, testing and imaging myself where available.  Lab Results  Component Value Date   WBC 3.8* 04/26/2015   HGB 9.7* 04/26/2015   HCT 30.0* 04/26/2015   MCV 87.0 04/26/2015   PLT 150 04/26/2015      Component Value Date/Time   NA 134* 06/29/2015 1015   K 4.4 06/29/2015 1015   CL 97* 06/29/2015 1015   CO2 31 06/29/2015 1015   GLUCOSE 291* 06/29/2015 1015   BUN 11 06/29/2015 1015   CREATININE 0.72 06/29/2015 1015   CALCIUM 8.8* 06/29/2015 1015   PROT 5.4* 04/21/2015 0211   ALBUMIN 2.7* 04/21/2015 0211   AST 61* 04/21/2015 0211   ALT 19 04/21/2015 0211   ALKPHOS 46 04/21/2015 0211   BILITOT 0.5 04/21/2015 0211   GFRNONAA >60 06/29/2015 1015   GFRAA >60 06/29/2015 1015   ASSESSMENT AND PLAN  HJALMER IOVINO is a 74 y.o. male    Essential tremor,  Keep primidone 50 mg 3 times a day  He has insulin-dependent diabetes, hypoglycemia episode, today's blood pressure is low normal range 105 over 60, heart rate of fifties, I will decrease his Propanolol to  Inderal LA 60 mg daily Left hemifacial spasm,   overall much improved after his bilateral blepharoplasty Status epilepticus  Induced by hypoglycemia Insulin dependent diabetes   Marcial Pacas, M.D. Ph.D.  [facility] [address], [place], [place] [address] Ph:  [phone] Fax: [phone]

## 2015-08-29 ENCOUNTER — Other Ambulatory Visit (HOSPITAL_COMMUNITY): Payer: Self-pay | Admitting: Gastroenterology

## 2015-08-29 DIAGNOSIS — R109 Unspecified abdominal pain: Secondary | ICD-10-CM

## 2015-08-29 DIAGNOSIS — R110 Nausea: Secondary | ICD-10-CM

## 2015-08-29 DIAGNOSIS — R112 Nausea with vomiting, unspecified: Secondary | ICD-10-CM

## 2015-09-04 ENCOUNTER — Other Ambulatory Visit: Payer: Self-pay | Admitting: Neurology

## 2015-09-14 ENCOUNTER — Encounter (HOSPITAL_COMMUNITY)
Admission: RE | Admit: 2015-09-14 | Discharge: 2015-09-14 | Disposition: A | Discharge status: Home / Self Care | Payer: 59 | Source: Ambulatory Visit | Attending: Gastroenterology | Admitting: Gastroenterology

## 2015-09-14 DIAGNOSIS — R110 Nausea: Secondary | ICD-10-CM | POA: Insufficient documentation

## 2015-09-14 DIAGNOSIS — R109 Unspecified abdominal pain: Secondary | ICD-10-CM | POA: Insufficient documentation

## 2015-09-14 DIAGNOSIS — R112 Nausea with vomiting, unspecified: Secondary | ICD-10-CM | POA: Insufficient documentation

## 2015-09-14 MED ORDER — TECHNETIUM TC 99M SULFUR COLLOID: 2.2000 | Freq: Once | INTRAVENOUS | Status: DC

## 2015-09-20 ENCOUNTER — Ambulatory Visit: Payer: 59 | Admitting: *Deleted

## 2015-09-26 ENCOUNTER — Other Ambulatory Visit: Payer: Medicare Other

## 2015-09-26 NOTE — Patient Outreach (Signed)
[facility]) Care Management  09/26/2015  Dale Morrow 08/22/1941 [id number]   Member did not show for scheduled appointment 09/26/15.  Plan to send missed appointment letter. Peter Garter RN, [facility] Care Management Coordinator-Link to [place] Management [phone]

## 2015-10-04 ENCOUNTER — Encounter: Payer: 59 | Attending: Internal Medicine | Admitting: *Deleted

## 2015-10-04 VITALS — Ht 67.0 in | Wt 175.0 lb

## 2015-10-04 DIAGNOSIS — Z713 Dietary counseling and surveillance: Secondary | ICD-10-CM | POA: Diagnosis not present

## 2015-10-04 DIAGNOSIS — Z794 Long term (current) use of insulin: Secondary | ICD-10-CM | POA: Diagnosis not present

## 2015-10-04 DIAGNOSIS — E118 Type 2 diabetes mellitus with unspecified complications: Secondary | ICD-10-CM | POA: Diagnosis present

## 2015-10-04 NOTE — Patient Instructions (Signed)
Plan: We have increased your Basal Rate at midnight by 10% from 1.00 to 1.10 until 3 AM We have discussed the Medtronic 630G and the 670G insulin pumps and CGM options. You have indicated you are not in favor of wearing 2 devices at this time.  Continue using your pump to give boluses for your meals and to correct high BGs as needed. Continue going to gym as tolerated, great job!

## 2015-10-04 NOTE — Progress Notes (Signed)
  Pump Follow Up Progress Note for 10/04/15 Appointment time: 1400 to 1445  Orders received from MD giving me permission to make insulin pump adjustments for this patient. Dale Morrow, Dale Morrow Male, 74 y.o., 1941-02-18  Reviewed blood glucose logs on 10/04/15 via: [facility] and found the following:                  Hypoglycemia Hyperglycemia Comments  Overnight Period:  NO  BASAL   Pre-Meal:    Breakfast  YES     Lunch      Supper     Post-Meal: Breakfast      Lunch  YES    Supper     Bedtime:       Comments: Patient states he was having problems with hypoglycemia overnight so he states Dr. Decreased his basal rate at MN from 1.35 to 1.00. He is now waking up with elevated FBG of 150 or higher so we discussed raising this basal rate moderately to 1.10 u/hr and reassessing over the next week or two. He also stated he uses 3 carb choices for each meal (45 grams) but upon a brief dietary recall he is not actually eating that much food. So I reviewed carb counting by food group and he plans to try and be more accurate with his food information for the Bolus Wizard going forward. We will re-assess his ICR with this more accurate carb counting at next visit.   Pump Settings: Date: Current Date: 10/04/2015 changes are in [facility] Print   Basal Rate: Carb Ratio Sensitivity  Basal Rate: Carb Ratio Sensitivity   MN: 1.00 MN: 10 MN: 20 MN: 1.10  (+)   MN: 10   MN: 20   3 AM 1.20 11 AM: 5 11 AM: 30 3AM 1.20   11 A: 5  11 AM: 30  6 AM 0.80   6 AM 0.80      5 PM 0.80   5 PM 0.80                                  He also is interested in weighing on the Tanita Scale so he can see the % Body Fat in relation to his total weight.  TANITA  BODY COMP RESULTS 10/26/14  Weight 196 lb   BMI (kg/m^2) 30.7   Fat Mass (lbs) 63.5 lb   Fat Free Mass (lbs) 132.5 lb   Total Body Water (lbs) 97 lb   TANITA  BODY COMP RESULTS 01/18/2015  Weight (lbs) 187.0   BMI (kg/m^2) 30.2   Fat Mass (lbs) 58.5   Fat Free  Mass (lbs) 128.5   Total Body Water (lbs) 94.0   TANITA  BODY COMP RESULTS 10/04/15  Weight (lbs) 175.0   BMI (kg/m^2) 27.4   Fat Mass (lbs) 49.5   Fat Free Mass (lbs) 125.5   Total Body Water (lbs) 92.0   Weight loss of 12 pounds in past 6 months noted, including 9 pounds of fat mass. I have suggested no further weight loss at this time.  Plan: patient to continue using pump as directed and follow up in 3 months or as needed

## 2015-10-06 ENCOUNTER — Encounter: Payer: Self-pay | Admitting: *Deleted

## 2015-10-19 ENCOUNTER — Other Ambulatory Visit (HOSPITAL_COMMUNITY): Payer: Self-pay | Admitting: Orthopedic Surgery

## 2015-10-19 DIAGNOSIS — G5622 Lesion of ulnar nerve, left upper limb: Secondary | ICD-10-CM

## 2015-10-19 DIAGNOSIS — G5602 Carpal tunnel syndrome, left upper limb: Secondary | ICD-10-CM | POA: Diagnosis not present

## 2015-10-23 ENCOUNTER — Ambulatory Visit: Payer: 59 | Admitting: Neurology

## 2015-10-24 ENCOUNTER — Ambulatory Visit (HOSPITAL_COMMUNITY)
Admission: RE | Admit: 2015-10-24 | Discharge: 2015-10-24 | Disposition: A | Discharge status: Home / Self Care | Payer: 59 | Source: Ambulatory Visit | Attending: Orthopedic Surgery | Admitting: Orthopedic Surgery

## 2015-10-24 DIAGNOSIS — G5622 Lesion of ulnar nerve, left upper limb: Secondary | ICD-10-CM | POA: Insufficient documentation

## 2015-10-24 DIAGNOSIS — M50321 Other cervical disc degeneration at C4-C5 level: Secondary | ICD-10-CM | POA: Insufficient documentation

## 2015-10-24 DIAGNOSIS — M47812 Spondylosis without myelopathy or radiculopathy, cervical region: Secondary | ICD-10-CM | POA: Diagnosis not present

## 2015-10-24 DIAGNOSIS — R200 Anesthesia of skin: Secondary | ICD-10-CM | POA: Insufficient documentation

## 2015-10-24 DIAGNOSIS — M50323 Other cervical disc degeneration at C6-C7 level: Secondary | ICD-10-CM | POA: Insufficient documentation

## 2015-10-24 DIAGNOSIS — R202 Paresthesia of skin: Secondary | ICD-10-CM | POA: Insufficient documentation

## 2015-10-24 DIAGNOSIS — M50322 Other cervical disc degeneration at C5-C6 level: Secondary | ICD-10-CM | POA: Diagnosis not present

## 2015-10-30 ENCOUNTER — Encounter: Payer: Self-pay | Admitting: Neurology

## 2015-10-30 ENCOUNTER — Ambulatory Visit: Payer: Self-pay | Admitting: Neurology

## 2015-10-30 ENCOUNTER — Ambulatory Visit (INDEPENDENT_AMBULATORY_CARE_PROVIDER_SITE_OTHER): Payer: 59 | Admitting: Neurology

## 2015-10-30 VITALS — BP 118/62 | HR 58 | Resp 20 | Ht 68.0 in | Wt 173.0 lb

## 2015-10-30 DIAGNOSIS — R202 Paresthesia of skin: Secondary | ICD-10-CM | POA: Diagnosis not present

## 2015-10-30 DIAGNOSIS — G250 Essential tremor: Secondary | ICD-10-CM

## 2015-10-30 DIAGNOSIS — R209 Unspecified disturbances of skin sensation: Secondary | ICD-10-CM

## 2015-10-30 MED ORDER — PROPRANOLOL HCL ER 60 MG PO CP24: 60.0000 mg | ORAL_CAPSULE | Freq: Every day | ORAL | Status: DC

## 2015-10-30 MED ORDER — GABAPENTIN 100 MG PO CAPS: 100.0000 mg | ORAL_CAPSULE | Freq: Three times a day (TID) | ORAL | Status: DC

## 2015-10-30 MED FILL — PROPRANOLOL ER 60 MG CAP: 60 | 30 days supply | Qty: 30 | Fill #0

## 2015-10-30 MED FILL — GABAPENTIN 100 MG CAPSULE: 100 | 30 days supply | Qty: 90 | Fill #0

## 2015-10-30 NOTE — Progress Notes (Signed)
Chief Complaint  Patient presents with  . Follow-up    tremors, primidone and propanolol have showed no difference in the tremors, rm 4 with wife   Chief Complaint  Patient presents with  . Follow-up    tremors, primidone and propanolol have showed no difference in the tremors, rm 4 with wife      Dale Morrow is a 75 years old referred by primary care physician for evaluation of tremor and left hemifacial spasm.  He has family history of tremor, mother at her old age,  his sister, maternal uncle all had bilateral hand tremor, he also has past medical history of hypertension, diabetes, insulin pump, hyperlipidemia, prostate cancer, status post radiation therapy history of left knee replacement  He  has a history of bilateral Bell's palsy, with residual left upper and lower face weakness  He noticed bilateral hand shaking since 2004,  difficulty writing, holding utensils, around 2009, he also noticed voice change, there was a tremorous quality to it  He denies loss sense of smell, and there was no REM sleep disorder, no gait difficulty. TSH was normal.   He was placed on propranolol in 2013, with moderate response, with bp at low normal side 100/66, primidone 50mg  qhs was added on since Nov 2013, He reported mild  improvement with primidone 50 mg 2 tablets twice a day.   I saw him regularly in the past for EMG guided xeomin injection since November 2014, he responded very well, last injection was in May of 2015. He had bilateral blephoplasty by [place] ophthalmologist in February 2016,  which has helped his left facial spasm, he also had left knee redo in April 2016, still complains of moderate constant left knee pain, on Dilaudid, oxycodone treatment, is going for rehabilitation, sometimes his left knee is so painful, make him nauseous,  He came back to clinic today complains of worsening bilateral hands tremor, difficulty holding a cup of coffee, holding utensils, cause inconvenient,  social embarrassment, also noticed head titubation, increase voice tremor, needs to repeat himself sometimes.  He is now taking propranolol 60 mg twice a day, primidone 50 mg 2 tablets 3 times a day, propranolol works better for him, blood pressure was in the range of 130-140.  UPDATE Jul 24 2015: He was admitted to the hospital in July 2016 for prolonged seizure due to hypoglycemia, blood glucose was dropped to 30s, EEG that time showed mild background slowing. He is now on insulin pump, also had left carpal tunnel, ulnar decompression surgery, which did help his complains of left hand paresthesia,  He has mild unsteady gait, continue complains of mild bilateral hands tremor, he is taking Inderal 80 mg twice a day, primidone 50 mg 3 times a day  UPDATE Oct 30 2015: He recently had MRI cervical spine by Dr. Burney Gauze, he still has bilateral hand paresthesia, no gait difficulty, no incontinence.  His diabetes is under good control, use insulin pump. He still has intermittent hand tremor, taking inderal LA 60mg  daily.  We have personally reviewed MRI of cervical spine: Cervical spondylosis and degenerative disc disease, causing prominent impingement at C3-4 and C5-6 ; moderate to prominent impingement at C6-7 ; moderate impingement at C4-5; and mild impingement at C7-T1 Very faint T2 signal hyperintensity in the pons favoring chronic ischemic microvascular white matter disease.  He denies neck pain, no radiating pain to bilateral shoulder, he continue has bilateral hands paresthesia, mild weakness, failed to improve by left ulnar and median decompression surgery,  per patient, he will have repeat EMG nerve conduction study by his orthopedic surgeon's office soon.  REVIEW OF SYSTEMS: Full 14 system review of systems performed and notable only for numbness, weakness, tremor, foot allergy  ALLERGIES: Allergies  Allergen Reactions  . Bee Venom Anaphylaxis  . Shellfish Allergy Anaphylaxis  .  Augmentin [Amoxicillin-Pot Clavulanate] Rash    HOME MEDICATIONS: Current Outpatient Prescriptions  Medication Sig Dispense Refill  . aspirin EC 81 MG tablet Take 81 mg by mouth daily.    [name] CALCIUM-VITAMIN D PO Take 1 tablet by mouth daily at 12 noon.    . carvedilol (COREG) 3.125 MG tablet Take 1 tablet (3.125 mg total) by mouth 2 (two) times daily with a meal. 60 tablet 5  . dexlansoprazole (DEXILANT) 60 MG capsule Take 60 mg by mouth daily.    [name] escitalopram (LEXAPRO) 20 MG tablet Take 20 mg by mouth daily.    . insulin regular 1 Units/mL in sodium chloride 0.9 % 250 mL Inject into the vein continuous.    [name] lisinopril (PRINIVIL,ZESTRIL) 2.5 MG tablet 2 (two) times daily.  6  . Multiple Vitamins-Minerals (ONE-A-DAY MENS 50+ ADVANTAGE PO) Take 1 tablet by mouth daily at 12 noon.    [name] oxyCODONE-acetaminophen (ROXICET) 5-325 MG per tablet Take 1 tablet by mouth every 4 (four) hours as needed for severe pain. 30 tablet 0  . polyethylene glycol powder (GLYCOLAX/MIRALAX) powder Take 17 g by mouth at bedtime.     . primidone (MYSOLINE) 50 MG tablet TAKE 2 TABLETS BY MOUTH 3 TIMES DAILY. 180 tablet 3  . Probiotic Product (ALIGN) 4 MG CAPS Take 4 mg by mouth daily.    . propranolol ER (INDERAL LA) 60 MG 24 hr capsule Take 1 capsule (60 mg total) by mouth daily. 30 capsule 6  . rosuvastatin (CRESTOR) 10 MG tablet Take 10 mg by mouth every morning.      No current facility-administered medications for this visit.    PAST MEDICAL HISTORY: Past Medical History  Diagnosis Date  . Arthritis     left knee  . Depression   . De Quervain's tenosynovitis, left 04/2012  . Tremors of nervous system     takes propranolol for tremors  . Prostate cancer, primary, with metastasis from prostate to other site [facility])     a. metastasis to lymph nodes;  b. s/p XRT.  [name] Complication of anesthesia     pt denies ponv, pt woke up during colonscopy in past  . Diabetes mellitus     Insulin pump, takes lisinopril for  kidneys, dr Buddy Duty endocrinologist  . Left-sided Bell's palsy   . Right-sided Bell's palsy   . Nonischemic cardiomyopathy ([place])     a. 04/2015 Echo: EF 25-30%, mid apical, antseptal, anterolat, inflat, infsept, apical AK. ? takotsubo. No thrombus. PASP 64mmHg;  b. 04/2014 Lexi MV: EF 29%, fixed mod size and intensity mid-dist ant, apical, inferior scar w/ wma's;  c. 04/2015 Cath: LM nl, LAD 50p/m, LCX 25p, RI nl, RCA nl, RPAV 50d-->Med Rx.  . Chronic systolic CHF (congestive heart failure) ([place])     a. 04/2015 Echo: EF 25-30%.  [name] GERD (gastroesophageal reflux disease)     PAST SURGICAL HISTORY: Past Surgical History  Procedure Laterality Date  . Hemorrhoid surgery  1970s  . Anterior cruciate ligament repair      right knee  . Wrist arthroscopy  05/03/2010    right; and release of 1st dorsal compartment  . Knee arthrotomy  03/18/2008  left; with scar exc.  [name] Knee arthroscopy  09/25/2007; 04/15/2005    left  . Knee joint manipulation  06/29/2007    left  . Total knee arthroplasty  04/29/2007    left  . Dorsal compartment release  04/21/2012    Procedure: RELEASE DORSAL COMPARTMENT (DEQUERVAIN);  Surgeon: Cammie Sickle., MD;  Location: [facility];  Service: Orthopedics;  Laterality: Left;  left 1st dorsal compartment release left wrist   . Minor amputation of digit Left 07/21/2013    Procedure: LEFT SMALL REVISION AMPUTATION (MINOR PROCEDURE) ;  Surgeon: Schuyler Amor, MD;  Location: [place];  Service: Orthopedics;  Laterality: Left;  . Colonoscopy with propofol N/A 04/07/2014    Procedure: COLONOSCOPY WITH PROPOFOL;  Surgeon: Jeryl Columbia, MD;  Location: [facility];  Service: Endoscopy;  Laterality: N/A;  . Total knee revision Left 01/25/2015    Procedure: LEFT TOTAL KNEE  ARTHOPLASTY REVISION;  Surgeon: Gaynelle Arabian, MD;  Location: [facility];  Service: Orthopedics;  Laterality: Left;  . Cardiac catheterization N/A 04/26/2015    Procedure: Left Heart Cath  and Coronary Angiography;  Surgeon: Jettie Booze, MD;  Location: [place] [facility];  Service: Cardiovascular;  Laterality: N/A;  . Cardiac catheterization  04/26/2015    Procedure: Intravascular Ultrasound/IVUS;  Surgeon: Jettie Booze, MD;  Location: [place] [facility];  Service: Cardiovascular;;  . Carpal tunnel release Left 07/05/2015    Procedure: LEFT CARPAL TUNNEL RELEASE;  Surgeon: Charlotte Crumb, MD;  Location: [place];  Service: Orthopedics;  Laterality: Left;  . Ulnar tunnel release Left 07/05/2015    Procedure: LEFT CUBITAL TUNNEL RELEASE;  Surgeon: Charlotte Crumb, MD;  Location: [place];  Service: Orthopedics;  Laterality: Left;    FAMILY HISTORY: Family History  Problem Relation Age of Onset  . Heart Problems Father   . Heart attack Father   . Hypertension Mother   . Stroke Mother     SOCIAL HISTORY:  Social History   Social History  . Marital Status: Married    Spouse Name: Akihiro Cogburn  . Number of Children: o  . Years of Education: college   Occupational History  . retired    Social History Main Topics  . Smoking status: Former Smoker -- 15 years    Types: Cigarettes    Quit date: 10/13/1979  . Smokeless tobacco: Never Used  . Alcohol Use: No  . Drug Use: No  . Sexual Activity: Not on file   Other Topics Concern  . Not on file   Social History Narrative   Patient lives at home with his wife Dale Morrow) and there four dogs.   Retired.   Education two years of college.   Right handed.   Caffeine one cup of coffee daily.     PHYSICAL EXAM   Filed Vitals:   10/30/15 0956  BP: 118/62  Pulse: 58  Resp: 20  Height: 5\' 8"  (1.727 m)  Weight: 173 lb (78.472 kg)    Not recorded      Body mass index is 26.31 kg/(m^2).  PHYSICAL EXAMNIATION:  Gen: NAD, conversant, well nourised, obese, well groomed                     Cardiovascular: Regular rate rhythm, no peripheral edema, warm, nontender. Eyes:  Conjunctivae clear without exudates or hemorrhage Neck: Supple, no carotid bruise. Pulmonary: Clear to auscultation bilaterally   NEUROLOGICAL EXAM:  MENTAL  STATUS: Speech:    Speech is normal; fluent and spontaneous with normal comprehension.  Cognition:    The patient is oriented to person, place, and time;     recent and remote memory intact;     language fluent;     normal attention, concentration,     fund of knowledge.  CRANIAL NERVES: CN II: Visual fields are full to confrontation. Pupils are 4 mm and briskly reactive to light. Visual acuity is 20/20 bilaterally. CN III, IV, VI: extraocular movement are normal. No ptosis. CN V: Facial sensation is intact to pinprick in all 3 divisions bilaterally. Corneal responses are intact.  CN VII: Face is asymmetry, he has mild left eye-closure, cheek puff weakness. CN VIII: Hearing is normal to rubbing fingers CN IX, X: Palate elevates symmetrically. Phonation is normal. CN XI: Head turning and shoulder shrug are intact CN XII: Tongue is midline with normal movements and no atrophy.  MOTOR:  He has bilateral hands postural tremor,. Muscle bulk and tone are normal. Muscle strength is normal.   REFLEXES: Reflexes are 2+ and symmetric at the biceps, triceps, knees, and ankles. Plantar responses are flexor.  SENSORY: Light touch, pinprick, position sense, and vibration sense are intact in fingers and toes.  COORDINATION: Rapid alternating movements and fine finger movements are intact. There is no dysmetria on finger-to-nose and heel-knee-shin. There are no abnormal or extraneous movements.   GAIT/STANCE: Antalgic dragging his left leg, cautious  Romberg is absent.   DIAGNOSTIC DATA (LABS, IMAGING, TESTING) - I reviewed patient records, labs, notes, testing and imaging myself where available.  ASSESSMENT AND PLAN  RISTON SCHULD is a 75 y.o. male    Essential tremor,  Keep primidone 50 mg ii 3 times a day  Tolerating  inderal LA 60mg  daily  Left hemifacial spasm,   overall much improved after his bilateral blepharoplasty Status epilepticus  Induced by hypoglycemia, not on antiepileptic medication.  Cervical degenerative changes, bilateral hands paresthesia  Add on gabapentin 100 mg 3 times a day Insulin dependent diabetes   Marcial Pacas, M.D. Ph.D.  [facility] [address], [place], [place] [address] Ph: [phone] Fax: [phone]

## 2015-10-31 MED FILL — PRIMIDONE 50 MG TABLET: 50 | 30 days supply | Qty: 180 | Fill #2

## 2015-10-31 MED FILL — raNITIdine HCL 150 MG TABS: 150 | 30 days supply | Qty: 60 | Fill #1

## 2015-10-31 MED FILL — CARVEDILOL 3.125 MG TABLET: 3.125 | 30 days supply | Qty: 60 | Fill #0

## 2015-11-02 DIAGNOSIS — R202 Paresthesia of skin: Secondary | ICD-10-CM | POA: Diagnosis not present

## 2015-11-03 ENCOUNTER — Other Ambulatory Visit: Payer: Self-pay

## 2015-11-03 VITALS — BP 118/62 | HR 47 | Resp 16 | Ht 68.0 in | Wt 180.0 lb

## 2015-11-03 DIAGNOSIS — Z794 Long term (current) use of insulin: Principal | ICD-10-CM

## 2015-11-03 DIAGNOSIS — E118 Type 2 diabetes mellitus with unspecified complications: Secondary | ICD-10-CM

## 2015-11-03 NOTE — Patient Outreach (Addendum)
[facility]) Care Management   11/03/2015  Dale Morrow 07-26-1941 [id number]  Dale Morrow is an 75 y.o. male.   Member seen for follow up office visit for Link to Wellness program for self management of Type 1 diabetes  Subjective: Member states he is slowly getting stronger since his hospitalization for hypoglycemia in July.  States he is back to going to the gym 7 days a week for 90 minutes.  States he saw Dale Morrow in December and he does not want him to get low.  States that he saw Dale Morrow in December and his insulin pump setting were changed to help keep him from going low at night.  States he is eating a bedtime snack daily.  States he is having tingling in his hands and he is going to see a specialist to see if it is his neck that could be causing it.    Objective:   Review of Systems  Neurological: Positive for tingling, tremors and sensory change.  All other systems reviewed and are negative.   Physical Exam Today's Vitals   11/03/15 1015  BP: 118/62  Pulse: 47  Resp: 16  Height: 1.727 m (5\' 8" )  Weight: 180 lb (81.647 kg)  SpO2: 99%  PainSc: 0-No pain   Current Medications:   Current Outpatient Prescriptions  Medication Sig Dispense Refill  . aspirin EC 81 MG tablet Take 81 mg by mouth daily.    [name] CALCIUM-VITAMIN D PO Take 1 tablet by mouth daily at 12 noon.    . carvedilol (COREG) 3.125 MG tablet Take 1 tablet (3.125 mg total) by mouth 2 (two) times daily with a meal. 60 tablet 5  . dexlansoprazole (DEXILANT) 60 MG capsule Take 60 mg by mouth daily.    [name] escitalopram (LEXAPRO) 20 MG tablet Take 20 mg by mouth daily.    [name] gabapentin (NEURONTIN) 100 MG capsule Take 1 capsule (100 mg total) by mouth 3 (three) times daily. 90 capsule 11  . insulin regular 1 Units/mL in sodium chloride 0.9 % 250 mL Inject into the vein continuous.    [name] lisinopril (PRINIVIL,ZESTRIL) 2.5 MG tablet 2 (two) times daily.  6  . Multiple Vitamins-Minerals (ONE-A-DAY MENS  50+ ADVANTAGE PO) Take 1 tablet by mouth daily at 12 noon.    [name] oxyCODONE-acetaminophen (ROXICET) 5-325 MG per tablet Take 1 tablet by mouth every 4 (four) hours as needed for severe pain. 30 tablet 0  . polyethylene glycol powder (GLYCOLAX/MIRALAX) powder Take 17 g by mouth at bedtime.     . primidone (MYSOLINE) 50 MG tablet TAKE 2 TABLETS BY MOUTH 3 TIMES DAILY. 180 tablet 3  . Probiotic Product (ALIGN) 4 MG CAPS Take 4 mg by mouth daily.    . propranolol ER (INDERAL LA) 60 MG 24 hr capsule Take 1 capsule (60 mg total) by mouth daily. 30 capsule 11  . rosuvastatin (CRESTOR) 10 MG tablet Take 10 mg by mouth every morning.      No current facility-administered medications for this visit.    Functional Status:   In your present state of health, do you have any difficulty performing the following activities: 11/03/2015 07/05/2015  Hearing? N N  Vision? N N  Difficulty concentrating or making decisions? N N  Walking or climbing stairs? N N  Dressing or bathing? N N  Doing errands, shopping? N -    Fall/Depression Screening:    PHQ 2/9 Scores 11/03/2015  PHQ - 2 Score 0  Assessment:   Member seen for follow up office visit for Link to Wellness program for self management of Type 1 diabetes.  Member is meeting his diabetes self management goal of hemoglobin A1C of 7.5 or below with last reading of 7.1.  Member is exercising daily and adhering to low CHO diet.  Member does carry glucotabs to use as needed.  Member is being followed by Dale Morrow RD to adjust insulin pump settings.  Wife unsure if glucagon has expired.  Plan:  Plan to continue to see Dale for insulin pump adjustments.  Call her to schedule an appointment  Plan to continue to exercise daily for 1-2 hours a day Plan to keep appointment with Dr. Buddy Morrow on 12/28/15       Plan to see Link to Wellness 05/06/16 at [place] Problem One        Most Recent Value   Care Plan Problem One  Potential for elevated blood sugars  related to dx of Type 1 DM   Role Documenting the Problem One  Care Management [place] for Problem One  Active   [facility] Long Term Goal (31-90 days)  Member will maintain hemoglobin A1C of 7.5 or below for the next 90 days   [facility]N Long Term Goal Start Date  11/03/15   Interventions for Problem One Long Term Goal  Reviewed CHO counting and portion control, Instructed to try drinking Glucerna for dinner if he does not feel like eating a full meal, Instructed to call Dale Morrow to schedule follow up appointment, Reinforced how to treat hypoglycemia and the rule  of 15, Given hand out on hypoglycemia, Instructed member and wife to get his glucagon refilled, Instructed to keep appointment with Dale Morrow on 12/28/15    Dale Garter RN, [facility] Care Management Coordinator-Link to [place] Management [phone]

## 2015-11-03 NOTE — Patient Instructions (Signed)
1. Plan to continue to see Bev for insulin pump adjustments.  Call her to schedule an appointment  2. Plan to continue to exercise daily for 1-2 hours a day 3. Plan to keep appointment with Dr. Buddy Duty on 12/28/15           Plan to see Link to Wellness 05/06/16 at [place]

## 2015-11-15 MED FILL — ESCITALOPRAM 20 MG TABLET: 20 | 90 days supply | Qty: 45 | Fill #2

## 2015-11-17 DIAGNOSIS — R200 Anesthesia of skin: Secondary | ICD-10-CM | POA: Diagnosis not present

## 2015-11-29 ENCOUNTER — Telehealth: Payer: Self-pay | Admitting: Neurology

## 2015-11-29 MED ORDER — GABAPENTIN 100 MG PO CAPS: ORAL_CAPSULE | ORAL | Status: DC

## 2015-11-29 NOTE — Telephone Encounter (Signed)
Per vo by Dr. Krista Blue, send in a new rx for gabapentin 100mg , 3 tablets TID.  This will allow him to titrate his dose up to 300mg , TID.  I have spoken with the patient and he will start with 2 capsules initially and work his way up, as tolerated to 300mg , TID.  He will call back with any questions or concerns.

## 2015-11-29 NOTE — Telephone Encounter (Signed)
Pt called in this morning and feels the gabapentin (NEURONTIN) 100 MG capsule is not strong enough. He wants to either increase dosage or try something different. Please call and advise [phone]

## 2015-11-30 MED FILL — GABAPENTIN 100 MG CAPSULE: 100 | 30 days supply | Qty: 270 | Fill #0

## 2015-11-30 MED FILL — CARVEDILOL 3.125 MG TABLET: 3.125 | 30 days supply | Qty: 60 | Fill #1

## 2015-11-30 MED FILL — HumaLOG 100 UNIT/ML SOLN: 100 | 90 days supply | Qty: 90 | Fill #2

## 2015-11-30 MED FILL — ONE TOUCH ULTRA TEST STRIPS: 90 days supply | Qty: 800 | Fill #2

## 2015-12-02 MED FILL — PRIMIDONE 50 MG TABLET: 50 | 30 days supply | Qty: 180 | Fill #3

## 2015-12-02 MED FILL — PROPRANOLOL ER 60 MG CAP: 60 | 30 days supply | Qty: 30 | Fill #1

## 2015-12-08 ENCOUNTER — Other Ambulatory Visit: Payer: Self-pay | Admitting: Urology

## 2015-12-08 DIAGNOSIS — R232 Flushing: Secondary | ICD-10-CM

## 2015-12-08 DIAGNOSIS — T50905A Adverse effect of unspecified drugs, medicaments and biological substances, initial encounter: Secondary | ICD-10-CM

## 2015-12-08 DIAGNOSIS — M8580 Other specified disorders of bone density and structure, unspecified site: Secondary | ICD-10-CM

## 2015-12-18 ENCOUNTER — Encounter: Payer: Self-pay | Admitting: Internal Medicine

## 2015-12-18 DIAGNOSIS — Z0000 Encounter for general adult medical examination without abnormal findings: Secondary | ICD-10-CM | POA: Diagnosis not present

## 2015-12-18 DIAGNOSIS — E785 Hyperlipidemia, unspecified: Secondary | ICD-10-CM | POA: Diagnosis not present

## 2015-12-18 DIAGNOSIS — Z794 Long term (current) use of insulin: Secondary | ICD-10-CM | POA: Diagnosis not present

## 2015-12-18 DIAGNOSIS — E1140 Type 2 diabetes mellitus with diabetic neuropathy, unspecified: Secondary | ICD-10-CM | POA: Diagnosis not present

## 2015-12-18 MED FILL — ROSUVASTATIN CALCIUM 10 MG: 10 | 90 days supply | Qty: 90 | Fill #2

## 2015-12-18 MED FILL — LISINOPRIL 2.5 MG TABLET: 2.5 | 30 days supply | Qty: 60 | Fill #2

## 2015-12-22 ENCOUNTER — Ambulatory Visit (INDEPENDENT_AMBULATORY_CARE_PROVIDER_SITE_OTHER): Payer: 59 | Admitting: Internal Medicine

## 2015-12-22 ENCOUNTER — Encounter: Payer: Self-pay | Admitting: Internal Medicine

## 2015-12-22 VITALS — BP 104/62 | HR 55 | Ht 68.0 in | Wt 176.1 lb

## 2015-12-22 DIAGNOSIS — I428 Other cardiomyopathies: Secondary | ICD-10-CM

## 2015-12-22 DIAGNOSIS — E785 Hyperlipidemia, unspecified: Secondary | ICD-10-CM | POA: Diagnosis not present

## 2015-12-22 DIAGNOSIS — I429 Cardiomyopathy, unspecified: Secondary | ICD-10-CM

## 2015-12-22 MED ORDER — CARVEDILOL 3.125 MG PO TABS: 3.1250 mg | ORAL_TABLET | Freq: Every day | ORAL | Status: DC

## 2015-12-22 NOTE — Progress Notes (Signed)
Cardiology Office Note   Date:  12/22/2015   ID:  [name], DOB [birth year], MRN [id number]  PCP:  Jonathon Bellows, MD  Cardiologist:   Dorris Carnes, MD   Ppresents for f/u of Takosubos cardiomyopathyt   History of Present Illness: Dale Morrow is a 75 y.o. male with a history of takosubo's cardiomyopathy.  LVEF last summer was 25 to 30%  Cardiac cath showed moderate nonobstructive CAD (04/2015)  Started on medical Rx  Echo in Aug 2016 LVEF was 60 to 65%   SInce he was last seen he says his breathing is good  He denies CP Remains active Biggest problems is in arms and hands  Has had since hospitalization last summer  Being evaluated by neuro     Outpatient Prescriptions Prior to Visit  Medication Sig Dispense Refill  . aspirin EC 81 MG tablet Take 81 mg by mouth daily.    [name] CALCIUM-VITAMIN D PO Take 1 tablet by mouth daily at 12 noon.    . carvedilol (COREG) 3.125 MG tablet Take 1 tablet (3.125 mg total) by mouth 2 (two) times daily with a meal. 60 tablet 5  . dexlansoprazole (DEXILANT) 60 MG capsule Take 60 mg by mouth daily.    [name] escitalopram (LEXAPRO) 20 MG tablet Take 20 mg by mouth daily.    [name] gabapentin (NEURONTIN) 100 MG capsule Take 3 capsules three times daily. 270 capsule 0  . insulin regular 1 Units/mL in sodium chloride 0.9 % 250 mL Inject into the vein continuous.    [name] lisinopril (PRINIVIL,ZESTRIL) 2.5 MG tablet 2 (two) times daily.  6  . Multiple Vitamins-Minerals (ONE-A-DAY MENS 50+ ADVANTAGE PO) Take 1 tablet by mouth daily at 12 noon.    . polyethylene glycol powder (GLYCOLAX/MIRALAX) powder Take 17 g by mouth at bedtime.     . primidone (MYSOLINE) 50 MG tablet TAKE 2 TABLETS BY MOUTH 3 TIMES DAILY. 180 tablet 3  . Probiotic Product (ALIGN) 4 MG CAPS Take 4 mg by mouth daily.    . propranolol ER (INDERAL LA) 60 MG 24 hr capsule Take 1 capsule (60 mg total) by mouth daily. 30 capsule 11  . rosuvastatin (CRESTOR) 10 MG tablet Take 10 mg by mouth every morning.      [name] oxyCODONE-acetaminophen (ROXICET) 5-325 MG per tablet Take 1 tablet by mouth every 4 (four) hours as needed for severe pain. (Patient not taking: Reported on 12/22/2015) 30 tablet 0   No facility-administered medications prior to visit.     Allergies:   Bee venom; Shellfish allergy; and Augmentin   Past Medical History  Diagnosis Date  . Arthritis     left knee  . Depression   . De Quervain's tenosynovitis, left 04/2012  . Tremors of nervous system     takes propranolol for tremors  . Prostate cancer, primary, with metastasis from prostate to other site [facility])     a. metastasis to lymph nodes;  b. s/p XRT.  [name] Complication of anesthesia     pt denies ponv, pt woke up during colonscopy in past  . Diabetes mellitus     Insulin pump, takes lisinopril for kidneys, dr Buddy Duty endocrinologist  . Left-sided Bell's palsy   . Right-sided Bell's palsy   . Nonischemic cardiomyopathy ([place])     a. 04/2015 Echo: EF 25-30%, mid apical, antseptal, anterolat, inflat, infsept, apical AK. ? takotsubo. No thrombus. PASP 37mmHg;  b. 04/2014 Lexi MV: EF 29%, fixed mod size and intensity  mid-dist ant, apical, inferior scar w/ wma's;  c. 04/2015 Cath: LM nl, LAD 50p/m, LCX 25p, RI nl, RCA nl, RPAV 50d-->Med Rx.  . Chronic systolic CHF (congestive heart failure) ([place])     a. 04/2015 Echo: EF 25-30%.  [name] GERD (gastroesophageal reflux disease)     Past Surgical History  Procedure Laterality Date  . Hemorrhoid surgery  1970s  . Anterior cruciate ligament repair      right knee  . Wrist arthroscopy  05/03/2010    right; and release of 1st dorsal compartment  . Knee arthrotomy  03/18/2008    left; with scar exc.  [name] Knee arthroscopy  09/25/2007; 04/15/2005    left  . Knee joint manipulation  06/29/2007    left  . Total knee arthroplasty  04/29/2007    left  . Dorsal compartment release  04/21/2012    Procedure: RELEASE DORSAL COMPARTMENT (DEQUERVAIN);  Surgeon: Cammie Sickle., MD;  Location: [facility];  Service: Orthopedics;  Laterality: Left;  left 1st dorsal compartment release left wrist   . Minor amputation of digit Left 07/21/2013    Procedure: LEFT SMALL REVISION AMPUTATION (MINOR PROCEDURE) ;  Surgeon: Schuyler Amor, MD;  Location: [place];  Service: Orthopedics;  Laterality: Left;  . Colonoscopy with propofol N/A 04/07/2014    Procedure: COLONOSCOPY WITH PROPOFOL;  Surgeon: Jeryl Columbia, MD;  Location: [facility];  Service: Endoscopy;  Laterality: N/A;  . Total knee revision Left 01/25/2015    Procedure: LEFT TOTAL KNEE  ARTHOPLASTY REVISION;  Surgeon: Gaynelle Arabian, MD;  Location: [facility];  Service: Orthopedics;  Laterality: Left;  . Cardiac catheterization N/A 04/26/2015    Procedure: Left Heart Cath and Coronary Angiography;  Surgeon: Jettie Booze, MD;  Location: [place] [facility];  Service: Cardiovascular;  Laterality: N/A;  . Cardiac catheterization  04/26/2015    Procedure: Intravascular Ultrasound/IVUS;  Surgeon: Jettie Booze, MD;  Location: [place] [facility];  Service: Cardiovascular;;  . Carpal tunnel release Left 07/05/2015    Procedure: LEFT CARPAL TUNNEL RELEASE;  Surgeon: Charlotte Crumb, MD;  Location: [place];  Service: Orthopedics;  Laterality: Left;  . Ulnar tunnel release Left 07/05/2015    Procedure: LEFT CUBITAL TUNNEL RELEASE;  Surgeon: Charlotte Crumb, MD;  Location: [place];  Service: Orthopedics;  Laterality: Left;     Social History:  The patient  reports that he quit smoking about 36 years ago. His smoking use included Cigarettes. He quit after 15 years of use. He has never used smokeless tobacco. He reports that he does not drink alcohol or use illicit drugs.   Family History:  The patient's family history includes Heart Problems in his father; Heart attack in his father; Hypertension in his mother; Stroke in his mother.    ROS:  Please see the history of present illness. All  other systems are reviewed and  Negative to the above problem except as noted.    PHYSICAL EXAM: VS:  BP 104/62 mmHg  Pulse 55  Ht 5\' 8"  (1.727 m)  Wt 176 lb 1.9 oz (79.888 kg)  BMI 26.79 kg/m2  SpO2 98%  GEN: Well nourished, well developed, in no acute distress HEENT: normal Neck: no JVD, carotid bruits, or masses Cardiac: RRR; no murmurs, rubs, or gallops,no edema  Respiratory:  clear to auscultation bilaterally, normal work of breathing GI: soft, nontender, nondistended, + BS  No hepatomegaly  MS: no deformity Moving all  extremities   Skin: warm and dry, no rash Neuro:  Strength and sensation are intact Psych: euthymic mood, full affect   EKG:  EKG is ordered today.   Lipid Panel    Component Value Date/Time   CHOL 138 04/26/2015 1145   TRIG 61 04/26/2015 1145   HDL 33* 04/26/2015 1145   CHOLHDL 4.2 04/26/2015 1145   VLDL 12 04/26/2015 1145   LDLCALC 93 04/26/2015 1145      Wt Readings from Last 3 Encounters:  12/22/15 176 lb 1.9 oz (79.888 kg)  11/03/15 180 lb (81.647 kg)  10/30/15 173 lb (78.472 kg)      ASSESSMENT AND PLAN:  1.  Takosubos CM  LVEF has normalized  Pt doing well   I would keep on lisinopril  Stop coreg  Continue inderal  2.  CAD  Moderate at cath   No symptos of angina  Keep on current meds  3.  HL  ON Crestor  Will Get lipids from Dr Justin Mend  Otherwise recomm he stay active  WIll f/u in 1 year     Current medicines are reviewed at length with the patient today.  The patient does not have concerns regarding medicines.  The following changes have been made:   Labs/ tests ordered today include: No orders of the defined types were placed in this encounter.     Disposition:   FU with  in   Signed, Dorris Carnes, MD  12/22/2015 9:39 AM    [address], [address], [place]  [address] Phone: [phone]; Fax: [phone]

## 2015-12-22 NOTE — Patient Instructions (Signed)
Your physician has recommended you make the following change in your medication:  1.) decrease carvedilol to once a day for 5 days, then STOP  Your physician wants you to follow-up in: 1 year with Dr. Harrington Challenger.  You will receive a reminder letter in the mail two months in advance. If you don't receive a letter, please call our office to schedule the follow-up appointment.

## 2015-12-28 DIAGNOSIS — Z794 Long term (current) use of insulin: Secondary | ICD-10-CM | POA: Diagnosis not present

## 2015-12-28 DIAGNOSIS — E1140 Type 2 diabetes mellitus with diabetic neuropathy, unspecified: Secondary | ICD-10-CM | POA: Diagnosis not present

## 2015-12-28 DIAGNOSIS — R202 Paresthesia of skin: Secondary | ICD-10-CM | POA: Diagnosis not present

## 2016-01-02 ENCOUNTER — Other Ambulatory Visit: Payer: Self-pay | Admitting: *Deleted

## 2016-01-02 DIAGNOSIS — E785 Hyperlipidemia, unspecified: Secondary | ICD-10-CM

## 2016-01-02 MED ORDER — ROSUVASTATIN CALCIUM 20 MG PO TABS: 20.0000 mg | ORAL_TABLET | Freq: Every day | ORAL | Status: DC

## 2016-01-02 MED FILL — raNITIdine HCL 150 MG TABS: 150 | 30 days supply | Qty: 60 | Fill #2

## 2016-01-03 ENCOUNTER — Telehealth: Payer: Self-pay | Admitting: Neurology

## 2016-01-03 MED ORDER — PREGABALIN 100 MG PO CAPS: 100.0000 mg | ORAL_CAPSULE | Freq: Three times a day (TID) | ORAL | Status: DC

## 2016-01-03 MED FILL — LYRICA 100 MG CAPSULE: 100 | 30 days supply | Qty: 90 | Fill #0

## 2016-01-03 NOTE — Telephone Encounter (Signed)
Called patient - he is taking gabapentin 900 mg, TID and feels it is not helpful.  Per Dr. Krista Blue, change him to Lyrica 100mg , TID.  Spoke to patient again and he is agreeable to try the new medication.

## 2016-01-03 NOTE — Telephone Encounter (Signed)
Patient is calling and states the Rx gabapentin 1 capsule 3 times a day is not working for him.  He is out and is requesting another medication. Please call.

## 2016-01-04 ENCOUNTER — Other Ambulatory Visit: Payer: Self-pay | Admitting: Neurology

## 2016-01-04 MED FILL — PRIMIDONE 50 MG TABLET: 50 | 30 days supply | Qty: 180 | Fill #0

## 2016-01-13 MED FILL — PROPRANOLOL ER 60 MG CAP: 60 | 30 days supply | Qty: 30 | Fill #2

## 2016-01-22 ENCOUNTER — Other Ambulatory Visit: Payer: Self-pay | Admitting: Cardiology

## 2016-01-22 MED FILL — LISINOPRIL 2.5 MG TABLET: 2.5 | 30 days supply | Qty: 60 | Fill #0

## 2016-01-22 NOTE — Telephone Encounter (Signed)
REFILL 

## 2016-01-29 MED FILL — TRIAZOLAM 0.25 MG TABLET: 0.25 | 4 days supply | Qty: 4 | Fill #0

## 2016-01-31 MED FILL — LYRICA 100 MG CAPSULE: 100 | 30 days supply | Qty: 90 | Fill #1

## 2016-02-07 DIAGNOSIS — E1040 Type 1 diabetes mellitus with diabetic neuropathy, unspecified: Secondary | ICD-10-CM | POA: Diagnosis not present

## 2016-02-07 DIAGNOSIS — E1065 Type 1 diabetes mellitus with hyperglycemia: Secondary | ICD-10-CM | POA: Diagnosis not present

## 2016-02-10 MED FILL — PRIMIDONE 50 MG TABLET: 50 | 30 days supply | Qty: 180 | Fill #1

## 2016-02-12 MED FILL — PROPRANOLOL ER 60 MG CAPSUL: 60 | 30 days supply | Qty: 30 | Fill #3

## 2016-02-13 ENCOUNTER — Emergency Department (HOSPITAL_COMMUNITY): Payer: 59

## 2016-02-13 ENCOUNTER — Inpatient Hospital Stay (HOSPITAL_COMMUNITY)
Admission: EM | Admit: 2016-02-13 | Discharge: 2016-02-16 | DRG: 086 | Disposition: A | Discharge status: Home / Self Care | Payer: 59 | Attending: General Surgery | Admitting: General Surgery

## 2016-02-13 ENCOUNTER — Encounter (HOSPITAL_COMMUNITY): Payer: Self-pay | Admitting: Oncology

## 2016-02-13 DIAGNOSIS — Z880 Allergy status to penicillin: Secondary | ICD-10-CM

## 2016-02-13 DIAGNOSIS — S066X9A Traumatic subarachnoid hemorrhage with loss of consciousness of unspecified duration, initial encounter: Secondary | ICD-10-CM | POA: Diagnosis not present

## 2016-02-13 DIAGNOSIS — S0532XA Ocular laceration without prolapse or loss of intraocular tissue, left eye, initial encounter: Secondary | ICD-10-CM | POA: Diagnosis present

## 2016-02-13 DIAGNOSIS — S7012XA Contusion of left thigh, initial encounter: Secondary | ICD-10-CM

## 2016-02-13 DIAGNOSIS — Z794 Long term (current) use of insulin: Secondary | ICD-10-CM

## 2016-02-13 DIAGNOSIS — W19XXXD Unspecified fall, subsequent encounter: Secondary | ICD-10-CM | POA: Diagnosis not present

## 2016-02-13 DIAGNOSIS — M25552 Pain in left hip: Secondary | ICD-10-CM | POA: Diagnosis not present

## 2016-02-13 DIAGNOSIS — S01112A Laceration without foreign body of left eyelid and periocular area, initial encounter: Secondary | ICD-10-CM | POA: Diagnosis present

## 2016-02-13 DIAGNOSIS — Z961 Presence of intraocular lens: Secondary | ICD-10-CM | POA: Diagnosis present

## 2016-02-13 DIAGNOSIS — S299XXA Unspecified injury of thorax, initial encounter: Secondary | ICD-10-CM | POA: Diagnosis not present

## 2016-02-13 DIAGNOSIS — I5022 Chronic systolic (congestive) heart failure: Secondary | ICD-10-CM | POA: Diagnosis present

## 2016-02-13 DIAGNOSIS — E118 Type 2 diabetes mellitus with unspecified complications: Secondary | ICD-10-CM | POA: Insufficient documentation

## 2016-02-13 DIAGNOSIS — S0590XA Unspecified injury of unspecified eye and orbit, initial encounter: Secondary | ICD-10-CM | POA: Diagnosis not present

## 2016-02-13 DIAGNOSIS — Z91030 Bee allergy status: Secondary | ICD-10-CM | POA: Diagnosis not present

## 2016-02-13 DIAGNOSIS — S01111A Laceration without foreign body of right eyelid and periocular area, initial encounter: Secondary | ICD-10-CM | POA: Diagnosis not present

## 2016-02-13 DIAGNOSIS — I5181 Takotsubo syndrome: Secondary | ICD-10-CM | POA: Diagnosis present

## 2016-02-13 DIAGNOSIS — E1165 Type 2 diabetes mellitus with hyperglycemia: Secondary | ICD-10-CM | POA: Diagnosis present

## 2016-02-13 DIAGNOSIS — G250 Essential tremor: Secondary | ICD-10-CM | POA: Diagnosis present

## 2016-02-13 DIAGNOSIS — S0083XA Contusion of other part of head, initial encounter: Secondary | ICD-10-CM

## 2016-02-13 DIAGNOSIS — G510 Bell's palsy: Secondary | ICD-10-CM | POA: Diagnosis present

## 2016-02-13 DIAGNOSIS — S0181XA Laceration without foreign body of other part of head, initial encounter: Secondary | ICD-10-CM | POA: Diagnosis not present

## 2016-02-13 DIAGNOSIS — Z79899 Other long term (current) drug therapy: Secondary | ICD-10-CM | POA: Diagnosis not present

## 2016-02-13 DIAGNOSIS — Z87891 Personal history of nicotine dependence: Secondary | ICD-10-CM

## 2016-02-13 DIAGNOSIS — I629 Nontraumatic intracranial hemorrhage, unspecified: Secondary | ICD-10-CM | POA: Diagnosis not present

## 2016-02-13 DIAGNOSIS — R739 Hyperglycemia, unspecified: Secondary | ICD-10-CM | POA: Diagnosis not present

## 2016-02-13 DIAGNOSIS — S066X0A Traumatic subarachnoid hemorrhage without loss of consciousness, initial encounter: Secondary | ICD-10-CM | POA: Diagnosis not present

## 2016-02-13 DIAGNOSIS — H40052 Ocular hypertension, left eye: Secondary | ICD-10-CM | POA: Diagnosis present

## 2016-02-13 DIAGNOSIS — S199XXA Unspecified injury of neck, initial encounter: Secondary | ICD-10-CM | POA: Diagnosis not present

## 2016-02-13 DIAGNOSIS — Z8546 Personal history of malignant neoplasm of prostate: Secondary | ICD-10-CM

## 2016-02-13 DIAGNOSIS — Y92009 Unspecified place in unspecified non-institutional (private) residence as the place of occurrence of the external cause: Secondary | ICD-10-CM | POA: Diagnosis not present

## 2016-02-13 DIAGNOSIS — Z91013 Allergy to seafood: Secondary | ICD-10-CM | POA: Diagnosis not present

## 2016-02-13 DIAGNOSIS — S069XAA Unspecified intracranial injury with loss of consciousness status unknown, initial encounter: Secondary | ICD-10-CM | POA: Diagnosis present

## 2016-02-13 DIAGNOSIS — E119 Type 2 diabetes mellitus without complications: Secondary | ICD-10-CM | POA: Diagnosis not present

## 2016-02-13 DIAGNOSIS — S065X0A Traumatic subdural hemorrhage without loss of consciousness, initial encounter: Secondary | ICD-10-CM | POA: Diagnosis present

## 2016-02-13 DIAGNOSIS — R52 Pain, unspecified: Secondary | ICD-10-CM

## 2016-02-13 DIAGNOSIS — Z7982 Long term (current) use of aspirin: Secondary | ICD-10-CM

## 2016-02-13 DIAGNOSIS — I429 Cardiomyopathy, unspecified: Secondary | ICD-10-CM | POA: Diagnosis not present

## 2016-02-13 DIAGNOSIS — E1065 Type 1 diabetes mellitus with hyperglycemia: Secondary | ICD-10-CM | POA: Diagnosis not present

## 2016-02-13 DIAGNOSIS — R209 Unspecified disturbances of skin sensation: Secondary | ICD-10-CM

## 2016-02-13 DIAGNOSIS — W19XXXA Unspecified fall, initial encounter: Secondary | ICD-10-CM | POA: Diagnosis present

## 2016-02-13 DIAGNOSIS — R413 Other amnesia: Secondary | ICD-10-CM | POA: Diagnosis present

## 2016-02-13 DIAGNOSIS — D649 Anemia, unspecified: Secondary | ICD-10-CM | POA: Diagnosis present

## 2016-02-13 DIAGNOSIS — Z9641 Presence of insulin pump (external) (internal): Secondary | ICD-10-CM | POA: Diagnosis present

## 2016-02-13 DIAGNOSIS — M79652 Pain in left thigh: Secondary | ICD-10-CM | POA: Diagnosis not present

## 2016-02-13 DIAGNOSIS — H5713 Ocular pain, bilateral: Secondary | ICD-10-CM | POA: Diagnosis not present

## 2016-02-13 DIAGNOSIS — I428 Other cardiomyopathies: Secondary | ICD-10-CM

## 2016-02-13 DIAGNOSIS — S06330A Contusion and laceration of cerebrum, unspecified, without loss of consciousness, initial encounter: Secondary | ICD-10-CM | POA: Diagnosis not present

## 2016-02-13 DIAGNOSIS — D509 Iron deficiency anemia, unspecified: Secondary | ICD-10-CM | POA: Diagnosis present

## 2016-02-13 DIAGNOSIS — S069X9A Unspecified intracranial injury with loss of consciousness of unspecified duration, initial encounter: Secondary | ICD-10-CM | POA: Diagnosis present

## 2016-02-13 DIAGNOSIS — IMO0001 Reserved for inherently not codable concepts without codable children: Secondary | ICD-10-CM | POA: Diagnosis present

## 2016-02-13 DIAGNOSIS — R202 Paresthesia of skin: Secondary | ICD-10-CM | POA: Diagnosis present

## 2016-02-13 LAB — COMPREHENSIVE METABOLIC PANEL
ALK PHOS: 84 U/L (ref 38–126)
ALT: 34 U/L (ref 17–63)
AST: 53 U/L — ABNORMAL HIGH (ref 15–41)
Albumin: 4 g/dL (ref 3.5–5.0)
Anion gap: 15 (ref 5–15)
BILIRUBIN TOTAL: 1.2 mg/dL (ref 0.3–1.2)
BUN: 30 mg/dL — ABNORMAL HIGH (ref 6–20)
CALCIUM: 8.8 mg/dL — AB (ref 8.9–10.3)
CO2: 22 mmol/L (ref 22–32)
CREATININE: 1.2 mg/dL (ref 0.61–1.24)
Chloride: 99 mmol/L — ABNORMAL LOW (ref 101–111)
GFR, EST NON AFRICAN AMERICAN: 58 mL/min — AB (ref >60)
Glucose, Bld: 605 mg/dL (ref 65–99)
Potassium: 5.8 mmol/L — ABNORMAL HIGH (ref 3.5–5.1)
SODIUM: 136 mmol/L (ref 135–145)
TOTAL PROTEIN: 7.3 g/dL (ref 6.5–8.1)

## 2016-02-13 LAB — BASIC METABOLIC PANEL
Anion gap: 15 (ref 5–15)
BUN: 22 mg/dL — AB (ref 6–20)
CO2: 20 mmol/L — ABNORMAL LOW (ref 22–32)
Calcium: 8.4 mg/dL — ABNORMAL LOW (ref 8.9–10.3)
Chloride: 104 mmol/L (ref 101–111)
Creatinine, Ser: 1.1 mg/dL (ref 0.61–1.24)
GFR calc Af Amer: >60 mL/min (ref >60)
GLUCOSE: 355 mg/dL — AB (ref 65–99)
POTASSIUM: 4.7 mmol/L (ref 3.5–5.1)
Sodium: 139 mmol/L (ref 135–145)

## 2016-02-13 LAB — URINALYSIS, ROUTINE W REFLEX MICROSCOPIC
BILIRUBIN URINE: NEGATIVE
Hgb urine dipstick: NEGATIVE
Leukocytes, UA: NEGATIVE
NITRITE: NEGATIVE
PH: 5 (ref 5.0–8.0)
Protein, ur: NEGATIVE mg/dL
Specific Gravity, Urine: 1.032 — ABNORMAL HIGH (ref 1.005–1.030)

## 2016-02-13 LAB — CBC WITH DIFFERENTIAL/PLATELET
BASOS ABS: 0 10*3/uL (ref 0.0–0.1)
Basophils Absolute: 0 10*3/uL (ref 0.0–0.1)
Basophils Relative: 0 %
Basophils Relative: 0 %
EOS PCT: 0 %
Eosinophils Absolute: 0.1 10*3/uL (ref 0.0–0.7)
Eosinophils Absolute: 0 10*3/uL (ref 0.0–0.7)
Eosinophils Relative: 1 %
HCT: 31.3 % — ABNORMAL LOW (ref 39.0–52.0)
HEMATOCRIT: 35.9 % — AB (ref 39.0–52.0)
HEMOGLOBIN: 11.7 g/dL — AB (ref 13.0–17.0)
Hemoglobin: 10.4 g/dL — ABNORMAL LOW (ref 13.0–17.0)
LYMPHS ABS: 1 10*3/uL (ref 0.7–4.0)
LYMPHS ABS: 0.9 10*3/uL (ref 0.7–4.0)
LYMPHS PCT: 9 %
LYMPHS PCT: 11 %
MCH: 30.8 pg (ref 26.0–34.0)
MCH: 30.7 pg (ref 26.0–34.0)
MCHC: 32.6 g/dL (ref 30.0–36.0)
MCHC: 33.2 g/dL (ref 30.0–36.0)
MCV: 94.5 fL (ref 78.0–100.0)
MCV: 92.3 fL (ref 78.0–100.0)
MONO ABS: 0.6 10*3/uL (ref 0.1–1.0)
Monocytes Absolute: 0.6 10*3/uL (ref 0.1–1.0)
Monocytes Relative: 6 %
Monocytes Relative: 8 %
NEUTROS PCT: 84 %
Neutro Abs: 9.4 10*3/uL — ABNORMAL HIGH (ref 1.7–7.7)
Neutro Abs: 6.5 10*3/uL (ref 1.7–7.7)
Neutrophils Relative %: 81 %
PLATELETS: 201 10*3/uL (ref 150–400)
Platelets: 202 10*3/uL (ref 150–400)
RBC: 3.8 MIL/uL — AB (ref 4.22–5.81)
RBC: 3.39 MIL/uL — ABNORMAL LOW (ref 4.22–5.81)
RDW: 13.8 % (ref 11.5–15.5)
RDW: 14 % (ref 11.5–15.5)
WBC: 11.1 10*3/uL — ABNORMAL HIGH (ref 4.0–10.5)
WBC: 8 10*3/uL (ref 4.0–10.5)

## 2016-02-13 LAB — CBG MONITORING, ED
GLUCOSE-CAPILLARY: 364 mg/dL — AB (ref 65–99)
Glucose-Capillary: >600 mg/dL (ref 65–99)
Glucose-Capillary: 366 mg/dL — ABNORMAL HIGH (ref 65–99)
Glucose-Capillary: 277 mg/dL — ABNORMAL HIGH (ref 65–99)

## 2016-02-13 LAB — GLUCOSE, CAPILLARY
GLUCOSE-CAPILLARY: 244 mg/dL — AB (ref 65–99)
GLUCOSE-CAPILLARY: 267 mg/dL — AB (ref 65–99)
Glucose-Capillary: 296 mg/dL — ABNORMAL HIGH (ref 65–99)

## 2016-02-13 LAB — PROTIME-INR
INR: 1.23 (ref 0.00–1.49)
Prothrombin Time: 15.7 seconds — ABNORMAL HIGH (ref 11.6–15.2)

## 2016-02-13 LAB — URINE MICROSCOPIC-ADD ON

## 2016-02-13 LAB — MRSA PCR SCREENING: MRSA BY PCR: NEGATIVE

## 2016-02-13 MED ORDER — ROSUVASTATIN CALCIUM 10 MG PO TABS
10.0000 mg | ORAL_TABLET | Freq: Every day | ORAL | Status: DC
  Administered 2016-02-14 – 2016-02-16 (×3): 10 mg via ORAL
  Filled 2016-02-13 (×3): qty 1

## 2016-02-13 MED ORDER — MORPHINE SULFATE (PF) 2 MG/ML IV SOLN
2.0000 mg | INTRAVENOUS | Status: DC | PRN
  Administered 2016-02-15: 2 mg via INTRAVENOUS
  Filled 2016-02-13: qty 1

## 2016-02-13 MED ORDER — PANTOPRAZOLE SODIUM 40 MG PO TBEC
40.0000 mg | DELAYED_RELEASE_TABLET | Freq: Every day | ORAL | Status: DC
  Administered 2016-02-13 – 2016-02-16 (×4): 40 mg via ORAL
  Filled 2016-02-13 (×4): qty 1

## 2016-02-13 MED ORDER — INSULIN GLARGINE 100 UNIT/ML ~~LOC~~ SOLN
5.0000 [IU] | Freq: Every day | SUBCUTANEOUS | Status: DC
Start: 2016-02-13 — End: 2016-02-14
  Administered 2016-02-13: 5 [IU] via SUBCUTANEOUS
  Filled 2016-02-13 (×2): qty 0.05

## 2016-02-13 MED ORDER — INSULIN ASPART 100 UNIT/ML ~~LOC~~ SOLN
0.0000 [IU] | SUBCUTANEOUS | Status: DC
  Administered 2016-02-13: 3 [IU] via SUBCUTANEOUS
  Administered 2016-02-13 (×2): 5 [IU] via SUBCUTANEOUS
  Administered 2016-02-14: 2 [IU] via SUBCUTANEOUS
  Administered 2016-02-14: 3 [IU] via SUBCUTANEOUS
  Administered 2016-02-14: 1 [IU] via SUBCUTANEOUS
  Administered 2016-02-14: 5 [IU] via SUBCUTANEOUS
  Administered 2016-02-14: 7 [IU] via SUBCUTANEOUS
  Filled 2016-02-13: qty 1

## 2016-02-13 MED ORDER — DORZOLAMIDE HCL-TIMOLOL MAL 2-0.5 % OP SOLN
1.0000 [drp] | Freq: Two times a day (BID) | OPHTHALMIC | Status: DC
  Administered 2016-02-13 – 2016-02-16 (×7): 1 [drp] via OPHTHALMIC
  Filled 2016-02-13: qty 10

## 2016-02-13 MED ORDER — LIDOCAINE HCL (CARDIAC) 20 MG/ML IV SOLN
INTRAVENOUS | Status: AC
  Filled 2016-02-13: qty 5

## 2016-02-13 MED ORDER — INSULIN ASPART 100 UNIT/ML ~~LOC~~ SOLN
10.0000 [IU] | Freq: Once | SUBCUTANEOUS | Status: AC
  Administered 2016-02-13: 10 [IU] via INTRAVENOUS
  Filled 2016-02-13: qty 1

## 2016-02-13 MED ORDER — TRAMADOL HCL 50 MG PO TABS
50.0000 mg | ORAL_TABLET | Freq: Four times a day (QID) | ORAL | Status: DC | PRN
Start: 2016-02-13 — End: 2016-02-16

## 2016-02-13 MED ORDER — CARVEDILOL 3.125 MG PO TABS: 3.1250 mg | ORAL_TABLET | Freq: Every day | ORAL | Status: DC

## 2016-02-13 MED ORDER — LISINOPRIL 2.5 MG PO TABS
2.5000 mg | ORAL_TABLET | Freq: Two times a day (BID) | ORAL | Status: DC
  Administered 2016-02-13 – 2016-02-16 (×6): 2.5 mg via ORAL
  Filled 2016-02-13 (×6): qty 1

## 2016-02-13 MED ORDER — LIDOCAINE HCL (CARDIAC) 20 MG/ML IV SOLN
INTRAVENOUS | Status: AC
Start: 2016-02-13 — End: 2016-02-14
  Filled 2016-02-13: qty 5

## 2016-02-13 MED ORDER — CLINDAMYCIN PHOSPHATE 600 MG/50ML IV SOLN
600.0000 mg | Freq: Once | INTRAVENOUS | Status: AC
  Administered 2016-02-13: 600 mg via INTRAVENOUS
  Filled 2016-02-13: qty 50

## 2016-02-13 MED ORDER — TETANUS-DIPHTH-ACELL PERTUSSIS 5-2.5-18.5 LF-MCG/0.5 IM SUSP
0.5000 mL | Freq: Once | INTRAMUSCULAR | Status: AC
  Administered 2016-02-13: 0.5 mL via INTRAMUSCULAR
  Filled 2016-02-13: qty 0.5

## 2016-02-13 MED ORDER — SODIUM CHLORIDE 0.9 % IV BOLUS (SEPSIS)
1000.0000 mL | Freq: Once | INTRAVENOUS | Status: AC
  Administered 2016-02-13: 1000 mL via INTRAVENOUS

## 2016-02-13 MED ORDER — ESCITALOPRAM OXALATE 10 MG PO TABS
20.0000 mg | ORAL_TABLET | Freq: Every day | ORAL | Status: DC
  Administered 2016-02-14 – 2016-02-16 (×3): 20 mg via ORAL
  Filled 2016-02-13 (×3): qty 2

## 2016-02-13 MED ORDER — PRIMIDONE 50 MG PO TABS
100.0000 mg | ORAL_TABLET | Freq: Three times a day (TID) | ORAL | Status: DC
  Administered 2016-02-13 – 2016-02-16 (×9): 100 mg via ORAL
  Filled 2016-02-13 (×11): qty 2

## 2016-02-13 MED ORDER — POLYETHYLENE GLYCOL 3350 17 GM/SCOOP PO POWD
17.0000 g | Freq: Every day | ORAL | Status: DC
  Administered 2016-02-14: 17 g via ORAL
  Filled 2016-02-13: qty 255

## 2016-02-13 MED ORDER — PREGABALIN 50 MG PO CAPS
100.0000 mg | ORAL_CAPSULE | Freq: Three times a day (TID) | ORAL | Status: DC
  Administered 2016-02-13 – 2016-02-16 (×10): 100 mg via ORAL
  Filled 2016-02-13 (×10): qty 2

## 2016-02-13 MED ORDER — PROPRANOLOL HCL ER 60 MG PO CP24
60.0000 mg | ORAL_CAPSULE | Freq: Every day | ORAL | Status: DC
Start: 2016-02-13 — End: 2016-02-16
  Administered 2016-02-13 – 2016-02-16 (×4): 60 mg via ORAL
  Filled 2016-02-13 (×4): qty 1

## 2016-02-13 MED ORDER — ONDANSETRON HCL 4 MG PO TABS: 4.0000 mg | ORAL_TABLET | Freq: Four times a day (QID) | ORAL | Status: DC | PRN

## 2016-02-13 MED ORDER — ONDANSETRON HCL 4 MG/2ML IJ SOLN
4.0000 mg | Freq: Four times a day (QID) | INTRAMUSCULAR | Status: DC | PRN
  Administered 2016-02-16: 4 mg via INTRAVENOUS
  Filled 2016-02-13: qty 2

## 2016-02-13 MED ORDER — GABAPENTIN 100 MG PO CAPS
100.0000 mg | ORAL_CAPSULE | Freq: Three times a day (TID) | ORAL | Status: DC
  Administered 2016-02-13 (×2): 100 mg via ORAL
  Filled 2016-02-13 (×3): qty 1

## 2016-02-13 MED ORDER — POTASSIUM CHLORIDE IN NACL 20-0.9 MEQ/L-% IV SOLN
INTRAVENOUS | Status: DC
  Administered 2016-02-13 – 2016-02-14 (×2): via INTRAVENOUS
  Filled 2016-02-13 (×4): qty 1000

## 2016-02-13 MED ORDER — BACITRACIN-POLYMYXIN B 500-10000 UNIT/GM OP OINT
TOPICAL_OINTMENT | Freq: Three times a day (TID) | OPHTHALMIC | Status: DC
  Administered 2016-02-13 – 2016-02-14 (×5): via OPHTHALMIC
  Administered 2016-02-14 – 2016-02-15 (×2): 1 via OPHTHALMIC
  Administered 2016-02-15 – 2016-02-16 (×4): via OPHTHALMIC
  Filled 2016-02-13: qty 3.5

## 2016-02-13 MED ORDER — DOCUSATE SODIUM 100 MG PO CAPS
100.0000 mg | ORAL_CAPSULE | Freq: Two times a day (BID) | ORAL | Status: DC
  Administered 2016-02-13 – 2016-02-16 (×6): 100 mg via ORAL
  Filled 2016-02-13 (×6): qty 1

## 2016-02-13 MED ORDER — SODIUM CHLORIDE 0.9 % IV SOLN
INTRAVENOUS | Status: DC
  Administered 2016-02-13: 11:00:00 via INTRAVENOUS

## 2016-02-13 NOTE — Consult Note (Signed)
Reason for Consult:Fall Referring Physician: Jamin Morrow is an 75 y.o. male.  HPI: Dale Morrow was home alone last night. He was in bed when his wife came home. She noticed some blood on the stair landing and then saw his bloody and swollen face. The pt is amnestic to the events of the night. His wife woke him up and he went to the bathroom but was unsteady. He then got weak and slid to the floor and she called EMS. Of note his CBG is over 600.  Past Medical History  Diagnosis Date  . Arthritis     left knee  . Depression   . De Quervain's tenosynovitis, left 04/2012  . Tremors of nervous system     takes propranolol for tremors  . Prostate cancer, primary, with metastasis from prostate to other site [facility])     a. metastasis to lymph nodes;  b. s/p XRT.  [name] Complication of anesthesia     pt denies ponv, pt woke up during colonscopy in past  . Diabetes mellitus     Insulin pump, takes lisinopril for kidneys, dr Buddy Duty endocrinologist  . Left-sided Bell's palsy   . Right-sided Bell's palsy   . Nonischemic cardiomyopathy ([place])     a. 04/2015 Echo: EF 25-30%, mid apical, antseptal, anterolat, inflat, infsept, apical AK. ? takotsubo. No thrombus. PASP 54mHg;  b. 04/2014 Lexi MV: EF 29%, fixed mod size and intensity mid-dist ant, apical, inferior scar w/ wma's;  c. 04/2015 Cath: LM nl, LAD 50p/m, LCX 25p, RI nl, RCA nl, RPAV 50d-->Med Rx.  . Chronic systolic CHF (congestive heart failure) ([place]     a. 04/2015 Echo: EF 25-30%.  .Marland KitchenGERD (gastroesophageal reflux disease)     Past Surgical History  Procedure Laterality Date  . Hemorrhoid surgery  1970s  . Anterior cruciate ligament repair      right knee  . Wrist arthroscopy  05/03/2010    right; and release of 1st dorsal compartment  . Knee arthrotomy  03/18/2008    left; with scar exc.  .Marland KitchenKnee arthroscopy  09/25/2007; 04/15/2005    left  . Knee joint manipulation  06/29/2007    left  . Total knee arthroplasty  04/29/2007    left  .  Dorsal compartment release  04/21/2012    Procedure: RELEASE DORSAL COMPARTMENT (DEQUERVAIN);  Surgeon: RCammie Sickle, MD;  [facility]  Service: Orthopedics;  Laterality: Left;  left 1st dorsal compartment release left wrist   . Minor amputation of digit Left 07/21/2013    Procedure: LEFT SMALL REVISION AMPUTATION (MINOR PROCEDURE) ;  Surgeon: MSchuyler Amor MD;  [place] [facility]  Service: Orthopedics;  Laterality: Left;  . Colonoscopy with propofol N/A 04/07/2014    Procedure: COLONOSCOPY WITH PROPOFOL;  Surgeon: MJeryl Columbia MD;  Location: [facility];  Service: Endoscopy;  Laterality: N/A;  . Total knee revision Left 01/25/2015    Procedure: LEFT TOTAL KNEE  ARTHOPLASTY REVISION;  Surgeon: FGaynelle Arabian MD;  Location: [facility];  Service: Orthopedics;  Laterality: Left;  . Cardiac catheterization N/A 04/26/2015    Procedure: Left Heart Cath and Coronary Angiography;  Surgeon: JJettie Booze MD;  [facility];  Service: Cardiovascular;  Laterality: N/A;  . Cardiac catheterization  04/26/2015    Procedure: Intravascular Ultrasound/IVUS;  Surgeon: JJettie Booze MD;  [facility];  Service: Cardiovascular;;  . Carpal tunnel release Left 07/05/2015  Procedure: LEFT CARPAL TUNNEL RELEASE;  Surgeon: Charlotte Crumb, MD;  Location: [place];  Service: Orthopedics;  Laterality: Left;  . Ulnar tunnel release Left 07/05/2015    Procedure: LEFT CUBITAL TUNNEL RELEASE;  Surgeon: Charlotte Crumb, MD;  Location: [place];  Service: Orthopedics;  Laterality: Left;    Family History  Problem Relation Age of Onset  . Heart Problems Father   . Heart attack Father   . Hypertension Mother   . Stroke Mother     Social History:  reports that he quit smoking about 36 years ago. His smoking use included Cigarettes. He quit after 15 years of use. He has never used smokeless tobacco.  He reports that he does not drink alcohol or use illicit drugs.  Allergies:  Allergies  Allergen Reactions  . Bee Venom Anaphylaxis  . Shellfish Allergy Anaphylaxis  . Augmentin [Amoxicillin-Pot Clavulanate] Rash    Medications: I have reviewed the patient's current medications.  Results for orders placed or performed during the hospital encounter of 02/13/16 (from the past 48 hour(s))  CBC with Differential     Status: Abnormal   Collection Time: 02/13/16  3:22 AM  Result Value Ref Range   WBC 11.1 (H) 4.0 - 10.5 K/uL   RBC 3.80 (L) 4.22 - 5.81 MIL/uL   Hemoglobin 11.7 (L) 13.0 - 17.0 g/dL   HCT 35.9 (L) 39.0 - 52.0 %   MCV 94.5 78.0 - 100.0 fL   MCH 30.8 26.0 - 34.0 pg   MCHC 32.6 30.0 - 36.0 g/dL   RDW 13.8 11.5 - 15.5 %   Platelets 202 150 - 400 K/uL   Neutrophils Relative % 84 %   Neutro Abs 9.4 (H) 1.7 - 7.7 K/uL   Lymphocytes Relative 9 %   Lymphs Abs 1.0 0.7 - 4.0 K/uL   Monocytes Relative 6 %   Monocytes Absolute 0.6 0.1 - 1.0 K/uL   Eosinophils Relative 1 %   Eosinophils Absolute 0.1 0.0 - 0.7 K/uL   Basophils Relative 0 %   Basophils Absolute 0.0 0.0 - 0.1 K/uL  Comprehensive metabolic panel     Status: Abnormal   Collection Time: 02/13/16  3:22 AM  Result Value Ref Range   Sodium 136 135 - 145 mmol/L   Potassium 5.8 (H) 3.5 - 5.1 mmol/L   Chloride 99 (L) 101 - 111 mmol/L   CO2 22 22 - 32 mmol/L   Glucose, Bld 605 (HH) 65 - 99 mg/dL    Comment: CRITICAL RESULT CALLED TO, READ BACK BY AND VERIFIED WITH: Candiss Norse RN [id number] 02/13/16 A NAVARRO    BUN 30 (H) 6 - 20 mg/dL   Creatinine, Ser 1.20 0.61 - 1.24 mg/dL   Calcium 8.8 (L) 8.9 - 10.3 mg/dL   Total Protein 7.3 6.5 - 8.1 g/dL   Albumin 4.0 3.5 - 5.0 g/dL   AST 53 (H) 15 - 41 U/L   ALT 34 17 - 63 U/L   Alkaline Phosphatase 84 38 - 126 U/L   Total Bilirubin 1.2 0.3 - 1.2 mg/dL   GFR calc non Af Amer 58 (L) >60 mL/min   GFR calc Af Amer >60 >60 mL/min    Comment: (NOTE) The eGFR has been calculated  using the CKD EPI equation. This calculation has not been validated in all clinical situations. eGFR's persistently <60 mL/min signify possible Chronic Kidney Disease.    Anion gap 15 5 - 15  CBG monitoring, ED  Status: Abnormal   Collection Time: 02/13/16  3:22 AM  Result Value Ref Range   Glucose-Capillary >600 (HH) 65 - 99 mg/dL  CBG monitoring, ED     Status: Abnormal   Collection Time: 02/13/16  7:38 AM  Result Value Ref Range   Glucose-Capillary 364 (H) 65 - 99 mg/dL  CBG monitoring, ED     Status: Abnormal   Collection Time: 02/13/16  8:16 AM  Result Value Ref Range   Glucose-Capillary 366 (H) 65 - 99 mg/dL    Dg Chest 1 View  02/13/2016  CLINICAL DATA:  Increasing leg pain after a fall yesterday. EXAM: CHEST 1 VIEW COMPARISON:  04/21/2015 FINDINGS: Shallow inspiration. Normal heart size and pulmonary vascularity. No focal airspace disease or consolidation in the lungs. No blunting of costophrenic angles. No pneumothorax. Mediastinal contours appear intact. IMPRESSION: No active disease. Electronically Signed   By: Lucienne Capers M.D.   On: 02/13/2016 04:44   Ct Head Wo Contrast  02/13/2016  CLINICAL DATA:  Initial evaluation for acute trauma, fall. EXAM: CT HEAD WITHOUT CONTRAST CT MAXILLOFACIAL WITHOUT CONTRAST CT CERVICAL SPINE WITHOUT CONTRAST TECHNIQUE: Multidetector CT imaging of the head, cervical spine, and maxillofacial structures were performed using the standard protocol without intravenous contrast. Multiplanar CT image reconstructions of the cervical spine and maxillofacial structures were also generated. COMPARISON:  Prior study from 04/19/2015. FINDINGS: CT HEAD FINDINGS Large left periorbital and frontal scalp contusion. Globes intact. No retro-orbital hematoma or other pathology. No calvarial fracture.  Mastoid air cells are clear. Age-related cerebral atrophy with chronic microvascular ischemic disease present. No acute large vessel territory infarct. Acute  subdural/ extra-axial hemorrhage overlies the right frontal convexity measuring up to 11 mm. Scattered subdural blood along the falx with a left parafalcine subdural hematoma measuring up to 4 mm. There is associated scattered acute subarachnoid hemorrhage within the right frontal lobe and parasagittal left frontal parietal lobes. Subarachnoid blood along the superior aspect of the left sylvian fissure. 13 mm contusion at the left anterior temporal pole. Small amount of hemorrhage along the septum pellucidum measures up to 9 mm. Acute subarachnoid hemorrhage within the posterior right temporal lobe. No midline shift. No hydrocephalus. Small amount of layering intraventricular hemorrhage within the occipital horns bilaterally. Basilar cisterns remain patent. Superimposed chronic appearing bilateral subdural hygroma is measure up to 5-6 mm bilaterally. CT MAXILLOFACIAL FINDINGS Large left forehead/left periorbital and facial contusion. Mild left-sided enophthalmos. The left globe itself appears intact without retro-orbital hematoma or other pathology. Right globe intact without retro-orbital pathology. Bony orbits intact. No orbital floor fracture. Lamina papyracea intact. Orbital roofs intact. Zygomatic arches intact. No acute maxillary fracture. Pterygoid plates intact. No acute nasal bone fracture. Mandible intact. Mandibular condyles normally situated within the temporomandibular fossa. Paranasal and this is are largely clear. CT CERVICAL SPINE FINDINGS Trace anterolisthesis of C4 on C5, likely chronic. The vertebral bodies are otherwise normally aligned with preservation of the normal cervical lordosis. Vertebral body heights are preserved. Normal C1-2 articulations are intact. No prevertebral soft tissue swelling. No acute fracture or listhesis. Moderate degenerative spondylolysis at C5-6 and C6-7. Scatter multilevel facet arthrosis noted. Visualized soft tissues of the neck demonstrate no acute abnormality.  Scattered vascular calcifications about the carotid bifurcations. Visualized lung apices are clear without evidence of apical pneumothorax. IMPRESSION: CT HEAD: 1. Acute traumatic subarachnoid hemorrhage involving both cerebral hemispheres as above, with additional 13 mm parenchymal contusion at the left temporal pole. 2. Small volume acute subdural hemorrhage overlying the right frontal  convexity and falx. 3. Small volume acute intraventricular hemorrhage within the occipital horns of both lateral ventricles. This may be related to redistribution. No hydrocephalus or midline shift. 4. Large left frontal/periorbital scalp contusion. No calvarial fracture. CT MAXILLOFACIAL: 1. No acute maxillofacial fracture. 2. Large left forehead/periorbital and facial contusion. There is associated left enophthalmos. Left globe itself appears grossly intact without retro-orbital hematoma or other pathology. CT CERVICAL SPINE: No acute traumatic injury within the cervical spine. Critical Value/emergent results were called by telephone at the time of interpretation on 02/13/2016 at 5:21 am to Dr. Veryl Speak , who verbally acknowledged these results. Electronically Signed   By: Jeannine Boga M.D.   On: 02/13/2016 05:23   Ct Cervical Spine Wo Contrast  02/13/2016  CLINICAL DATA:  Initial evaluation for acute trauma, fall. EXAM: CT HEAD WITHOUT CONTRAST CT MAXILLOFACIAL WITHOUT CONTRAST CT CERVICAL SPINE WITHOUT CONTRAST TECHNIQUE: Multidetector CT imaging of the head, cervical spine, and maxillofacial structures were performed using the standard protocol without intravenous contrast. Multiplanar CT image reconstructions of the cervical spine and maxillofacial structures were also generated. COMPARISON:  Prior study from 04/19/2015. FINDINGS: CT HEAD FINDINGS Large left periorbital and frontal scalp contusion. Globes intact. No retro-orbital hematoma or other pathology. No calvarial fracture.  Mastoid air cells are clear.  Age-related cerebral atrophy with chronic microvascular ischemic disease present. No acute large vessel territory infarct. Acute subdural/ extra-axial hemorrhage overlies the right frontal convexity measuring up to 11 mm. Scattered subdural blood along the falx with a left parafalcine subdural hematoma measuring up to 4 mm. There is associated scattered acute subarachnoid hemorrhage within the right frontal lobe and parasagittal left frontal parietal lobes. Subarachnoid blood along the superior aspect of the left sylvian fissure. 13 mm contusion at the left anterior temporal pole. Small amount of hemorrhage along the septum pellucidum measures up to 9 mm. Acute subarachnoid hemorrhage within the posterior right temporal lobe. No midline shift. No hydrocephalus. Small amount of layering intraventricular hemorrhage within the occipital horns bilaterally. Basilar cisterns remain patent. Superimposed chronic appearing bilateral subdural hygroma is measure up to 5-6 mm bilaterally. CT MAXILLOFACIAL FINDINGS Large left forehead/left periorbital and facial contusion. Mild left-sided enophthalmos. The left globe itself appears intact without retro-orbital hematoma or other pathology. Right globe intact without retro-orbital pathology. Bony orbits intact. No orbital floor fracture. Lamina papyracea intact. Orbital roofs intact. Zygomatic arches intact. No acute maxillary fracture. Pterygoid plates intact. No acute nasal bone fracture. Mandible intact. Mandibular condyles normally situated within the temporomandibular fossa. Paranasal and this is are largely clear. CT CERVICAL SPINE FINDINGS Trace anterolisthesis of C4 on C5, likely chronic. The vertebral bodies are otherwise normally aligned with preservation of the normal cervical lordosis. Vertebral body heights are preserved. Normal C1-2 articulations are intact. No prevertebral soft tissue swelling. No acute fracture or listhesis. Moderate degenerative spondylolysis at  C5-6 and C6-7. Scatter multilevel facet arthrosis noted. Visualized soft tissues of the neck demonstrate no acute abnormality. Scattered vascular calcifications about the carotid bifurcations. Visualized lung apices are clear without evidence of apical pneumothorax. IMPRESSION: CT HEAD: 1. Acute traumatic subarachnoid hemorrhage involving both cerebral hemispheres as above, with additional 13 mm parenchymal contusion at the left temporal pole. 2. Small volume acute subdural hemorrhage overlying the right frontal convexity and falx. 3. Small volume acute intraventricular hemorrhage within the occipital horns of both lateral ventricles. This may be related to redistribution. No hydrocephalus or midline shift. 4. Large left frontal/periorbital scalp contusion. No calvarial fracture. CT MAXILLOFACIAL: 1. No  acute maxillofacial fracture. 2. Large left forehead/periorbital and facial contusion. There is associated left enophthalmos. Left globe itself appears grossly intact without retro-orbital hematoma or other pathology. CT CERVICAL SPINE: No acute traumatic injury within the cervical spine. Critical Value/emergent results were called by telephone at the time of interpretation on 02/13/2016 at 5:21 am to Dr. Veryl Speak , who verbally acknowledged these results. Electronically Signed   By: Jeannine Boga M.D.   On: 02/13/2016 05:23   Dg Hip Unilat With Pelvis 2-3 Views Left  02/13/2016  CLINICAL DATA:  Increasing pain after a fall. EXAM: DG HIP (WITH OR WITHOUT PELVIS) 2-3V LEFT COMPARISON:  None. FINDINGS: Pelvis and left hip appear intact. No evidence of acute fracture or dislocation. No focal bone lesion or bone destruction. Vascular calcifications. Surgical clips over the symphysis pubis. IMPRESSION: No acute bony abnormalities. Electronically Signed   By: Lucienne Capers M.D.   On: 02/13/2016 04:45   Dg Femur Min 2 Views Left  02/13/2016  CLINICAL DATA:  Increasing pain and difficulty weight-bearing after  a fall late afternoon of 02/12/2016. EXAM: LEFT FEMUR 2 VIEWS COMPARISON:  None. FINDINGS: Left total knee arthroplasty. No evidence of acute fracture or dislocation. No focal bone lesion or bone destruction. Soft tissues are unremarkable. IMPRESSION: No acute bony abnormalities. Electronically Signed   By: Lucienne Capers M.D.   On: 02/13/2016 04:44   Ct Maxillofacial Wo Cm  02/13/2016  CLINICAL DATA:  Initial evaluation for acute trauma, fall. EXAM: CT HEAD WITHOUT CONTRAST CT MAXILLOFACIAL WITHOUT CONTRAST CT CERVICAL SPINE WITHOUT CONTRAST TECHNIQUE: Multidetector CT imaging of the head, cervical spine, and maxillofacial structures were performed using the standard protocol without intravenous contrast. Multiplanar CT image reconstructions of the cervical spine and maxillofacial structures were also generated. COMPARISON:  Prior study from 04/19/2015. FINDINGS: CT HEAD FINDINGS Large left periorbital and frontal scalp contusion. Globes intact. No retro-orbital hematoma or other pathology. No calvarial fracture.  Mastoid air cells are clear. Age-related cerebral atrophy with chronic microvascular ischemic disease present. No acute large vessel territory infarct. Acute subdural/ extra-axial hemorrhage overlies the right frontal convexity measuring up to 11 mm. Scattered subdural blood along the falx with a left parafalcine subdural hematoma measuring up to 4 mm. There is associated scattered acute subarachnoid hemorrhage within the right frontal lobe and parasagittal left frontal parietal lobes. Subarachnoid blood along the superior aspect of the left sylvian fissure. 13 mm contusion at the left anterior temporal pole. Small amount of hemorrhage along the septum pellucidum measures up to 9 mm. Acute subarachnoid hemorrhage within the posterior right temporal lobe. No midline shift. No hydrocephalus. Small amount of layering intraventricular hemorrhage within the occipital horns bilaterally. Basilar cisterns  remain patent. Superimposed chronic appearing bilateral subdural hygroma is measure up to 5-6 mm bilaterally. CT MAXILLOFACIAL FINDINGS Large left forehead/left periorbital and facial contusion. Mild left-sided enophthalmos. The left globe itself appears intact without retro-orbital hematoma or other pathology. Right globe intact without retro-orbital pathology. Bony orbits intact. No orbital floor fracture. Lamina papyracea intact. Orbital roofs intact. Zygomatic arches intact. No acute maxillary fracture. Pterygoid plates intact. No acute nasal bone fracture. Mandible intact. Mandibular condyles normally situated within the temporomandibular fossa. Paranasal and this is are largely clear. CT CERVICAL SPINE FINDINGS Trace anterolisthesis of C4 on C5, likely chronic. The vertebral bodies are otherwise normally aligned with preservation of the normal cervical lordosis. Vertebral body heights are preserved. Normal C1-2 articulations are intact. No prevertebral soft tissue swelling. No acute fracture or listhesis.  Moderate degenerative spondylolysis at C5-6 and C6-7. Scatter multilevel facet arthrosis noted. Visualized soft tissues of the neck demonstrate no acute abnormality. Scattered vascular calcifications about the carotid bifurcations. Visualized lung apices are clear without evidence of apical pneumothorax. IMPRESSION: CT HEAD: 1. Acute traumatic subarachnoid hemorrhage involving both cerebral hemispheres as above, with additional 13 mm parenchymal contusion at the left temporal pole. 2. Small volume acute subdural hemorrhage overlying the right frontal convexity and falx. 3. Small volume acute intraventricular hemorrhage within the occipital horns of both lateral ventricles. This may be related to redistribution. No hydrocephalus or midline shift. 4. Large left frontal/periorbital scalp contusion. No calvarial fracture. CT MAXILLOFACIAL: 1. No acute maxillofacial fracture. 2. Large left forehead/periorbital and  facial contusion. There is associated left enophthalmos. Left globe itself appears grossly intact without retro-orbital hematoma or other pathology. CT CERVICAL SPINE: No acute traumatic injury within the cervical spine. Critical Value/emergent results were called by telephone at the time of interpretation on 02/13/2016 at 5:21 am to Dr. Veryl Speak , who verbally acknowledged these results. Electronically Signed   By: Jeannine Boga M.D.   On: 02/13/2016 05:23    Review of Systems  Unable to perform ROS: mental acuity   Blood pressure 108/60, pulse 77, temperature 98.5 F (36.9 C), temperature source Oral, resp. rate 17, SpO2 100 %. Physical Exam  Vitals reviewed. Constitutional: He appears well-developed and well-nourished. He is cooperative. No distress. Cervical collar and nasal cannula in place.  HENT:  Head: Normocephalic. Head is with contusion. Head is without raccoon's eyes, without Battle's sign, without abrasion and without laceration.    Right Ear: Hearing, tympanic membrane, external ear and ear canal normal. No lacerations. No drainage or tenderness. No foreign bodies. Tympanic membrane is not perforated. No hemotympanum.  Left Ear: Hearing, tympanic membrane, external ear and ear canal normal. No lacerations. No drainage or tenderness. No foreign bodies. Tympanic membrane is not perforated. No hemotympanum.  Nose: Nose normal. No nose lacerations, sinus tenderness, nasal deformity or nasal septal hematoma. No epistaxis.  Mouth/Throat: Uvula is midline, oropharynx is clear and moist and mucous membranes are normal. No lacerations. No oropharyngeal exudate.  Eyes: Conjunctivae, EOM and lids are normal. Pupils are equal, round, and reactive to light. No scleral icterus.    Neck: Trachea normal and normal range of motion. Neck supple. No JVD present. No spinous process tenderness and no muscular tenderness present. Carotid bruit is not present. No tracheal deviation present. No  thyromegaly present.  Cardiovascular: Normal rate, regular rhythm, normal heart sounds, intact distal pulses and normal pulses.  Exam reveals no gallop and no friction rub.   No murmur heard. Respiratory: Effort normal and breath sounds normal. No stridor. No respiratory distress. He has no wheezes. He has no rales. He exhibits no tenderness, no bony tenderness, no laceration and no crepitus.  GI: Soft. Normal appearance and bowel sounds are normal. He exhibits no distension. There is no tenderness. There is no rigidity, no rebound, no guarding and no CVA tenderness.  Genitourinary: Penis normal.  Musculoskeletal: Normal range of motion. He exhibits no edema or tenderness.  Lymphadenopathy:    He has no cervical adenopathy.  Neurological: He is alert. He has normal strength. No cranial nerve deficit or sensory deficit. GCS eye subscore is 4. GCS verbal subscore is 4. GCS motor subscore is 6.  Ox1 -- ED in [place] and 1999  Skin: Skin is warm, dry and intact. He is not diaphoretic.  Psychiatric: He has a normal  mood and affect. His speech is normal and behavior is normal.    Assessment/Plan: Fall  TBI w/SAH, SDH -- Dr. Annette Stable to consult, HCT in am, TBI team Left periorbital swelling, laceration -- Dr. Katy Fitch to consult Multiple medical problems including uncontrolled DM -- IM to admit to manage    Lisette Abu, PA-C Pager: [phone] General Trauma [place] Pager: [phone] 02/13/2016, 8:47 AM

## 2016-02-13 NOTE — ED Provider Notes (Addendum)
CSN: [id number]     Arrival date & time 02/13/16  0253 History  By signing my name below, I, [facility], attest that this documentation has been prepared under the direction and in the presence of Veryl Speak, MD. Electronically Signed: Virgel Bouquet, ED Scribe. 02/13/2016. 3:57 AM.   Chief Complaint  Patient presents with  . Fall   HPI Comments: Dale Morrow is a 75 y.o. male brought in by EMS with an hx of prostate cancer who presents to the Emergency Department after a fall that occurred earlier tonight. History is provided by wife. Wife states that she spoke with the pt 7 hours ago who stated he was going to go downstairs to get a snack. She states that she found blood on the stairs landing and the pt in bed with ecchymosis and constant, gradually worsening swelling around his left eye 3.5 hours ago. She reports weakness upon standing shortly prior to EMS arrival that caused the pt to fall to his knees. She applied ice to his left eye without relief. He has an hx of hyperglycemic seiziure that required admission to [facility] in the past. Denies hx of dementia. Denies any other symptoms currently.   Patient is a 75 y.o. male presenting with fall. The history is provided by the spouse. The history is limited by the condition of the patient. No language interpreter was used.  Fall This is a new problem. The current episode started 6 to 12 hours ago. The problem occurs rarely.    Past Medical History  Diagnosis Date  . Arthritis     left knee  . Depression   . De Quervain's tenosynovitis, left 04/2012  . Tremors of nervous system     takes propranolol for tremors  . Prostate cancer, primary, with metastasis from prostate to other site [facility])     a. metastasis to lymph nodes;  b. s/p XRT.  [name] Complication of anesthesia     pt denies ponv, pt woke up during colonscopy in past  . Diabetes mellitus     Insulin pump, takes lisinopril for kidneys, dr Buddy Duty endocrinologist  .  Left-sided Bell's palsy   . Right-sided Bell's palsy   . Nonischemic cardiomyopathy ([place])     a. 04/2015 Echo: EF 25-30%, mid apical, antseptal, anterolat, inflat, infsept, apical AK. ? takotsubo. No thrombus. PASP 52mmHg;  b. 04/2014 Lexi MV: EF 29%, fixed mod size and intensity mid-dist ant, apical, inferior scar w/ wma's;  c. 04/2015 Cath: LM nl, LAD 50p/m, LCX 25p, RI nl, RCA nl, RPAV 50d-->Med Rx.  . Chronic systolic CHF (congestive heart failure) ([place])     a. 04/2015 Echo: EF 25-30%.  [name] GERD (gastroesophageal reflux disease)    Past Surgical History  Procedure Laterality Date  . Hemorrhoid surgery  1970s  . Anterior cruciate ligament repair      right knee  . Wrist arthroscopy  05/03/2010    right; and release of 1st dorsal compartment  . Knee arthrotomy  03/18/2008    left; with scar exc.  [name] Knee arthroscopy  09/25/2007; 04/15/2005    left  . Knee joint manipulation  06/29/2007    left  . Total knee arthroplasty  04/29/2007    left  . Dorsal compartment release  04/21/2012    Procedure: RELEASE DORSAL COMPARTMENT (DEQUERVAIN);  Surgeon: Cammie Sickle., MD;  Location: [facility];  Service: Orthopedics;  Laterality: Left;  left 1st dorsal compartment release left wrist   .  Minor amputation of digit Left 07/21/2013    Procedure: LEFT SMALL REVISION AMPUTATION (MINOR PROCEDURE) ;  Surgeon: Schuyler Amor, MD;  Location: [place];  Service: Orthopedics;  Laterality: Left;  . Colonoscopy with propofol N/A 04/07/2014    Procedure: COLONOSCOPY WITH PROPOFOL;  Surgeon: Jeryl Columbia, MD;  Location: [facility];  Service: Endoscopy;  Laterality: N/A;  . Total knee revision Left 01/25/2015    Procedure: LEFT TOTAL KNEE  ARTHOPLASTY REVISION;  Surgeon: Gaynelle Arabian, MD;  Location: [facility];  Service: Orthopedics;  Laterality: Left;  . Cardiac catheterization N/A 04/26/2015    Procedure: Left Heart Cath and Coronary Angiography;  Surgeon: Jettie Booze, MD;   Location: [place] [facility];  Service: Cardiovascular;  Laterality: N/A;  . Cardiac catheterization  04/26/2015    Procedure: Intravascular Ultrasound/IVUS;  Surgeon: Jettie Booze, MD;  Location: [place] [facility];  Service: Cardiovascular;;  . Carpal tunnel release Left 07/05/2015    Procedure: LEFT CARPAL TUNNEL RELEASE;  Surgeon: Charlotte Crumb, MD;  Location: [place];  Service: Orthopedics;  Laterality: Left;  . Ulnar tunnel release Left 07/05/2015    Procedure: LEFT CUBITAL TUNNEL RELEASE;  Surgeon: Charlotte Crumb, MD;  Location: [place];  Service: Orthopedics;  Laterality: Left;   Family History  Problem Relation Age of Onset  . Heart Problems Father   . Heart attack Father   . Hypertension Mother   . Stroke Mother    Social History  Substance Use Topics  . Smoking status: Former Smoker -- 15 years    Types: Cigarettes    Quit date: 10/13/1979  . Smokeless tobacco: Never Used  . Alcohol Use: No    Review of Systems  Skin: Positive for color change.  Neurological: Positive for weakness.  All other systems reviewed and are negative.     Allergies  Bee venom; Shellfish allergy; and Augmentin  Home Medications   Prior to Admission medications   Medication Sig Start Date End Date Taking? Authorizing Provider  aspirin EC 81 MG tablet Take 81 mg by mouth daily.   Yes Historical Provider, MD  CALCIUM-VITAMIN D PO Take 1 tablet by mouth daily at 12 noon.   Yes Historical Provider, MD  dexlansoprazole (DEXILANT) 60 MG capsule Take 60 mg by mouth daily.   Yes Historical Provider, MD  escitalopram (LEXAPRO) 20 MG tablet Take 20 mg by mouth daily.   Yes Historical Provider, MD  gabapentin (NEURONTIN) 100 MG capsule Take 1 capsule by mouth 3 (three) times daily. 11/30/15  Yes Historical Provider, MD  HUMALOG 100 UNIT/ML injection  11/30/15  Yes Historical Provider, MD  insulin regular 1 Units/mL in sodium chloride 0.9 % 250 mL Inject  into the vein continuous.   Yes Historical Provider, MD  lisinopril (PRINIVIL,ZESTRIL) 2.5 MG tablet TAKE 1 TABLET BY MOUTH TWICE DAILY 01/22/16  Yes Brittainy Erie Noe, PA-C  Multiple Vitamins-Minerals (ONE-A-DAY MENS 50+ ADVANTAGE PO) Take 1 tablet by mouth daily at 12 noon.   Yes Historical Provider, MD  polyethylene glycol powder (GLYCOLAX/MIRALAX) powder Take 17 g by mouth at bedtime.    Yes Historical Provider, MD  pregabalin (LYRICA) 100 MG capsule Take 1 capsule (100 mg total) by mouth 3 (three) times daily. 01/03/16  Yes Marcial Pacas, MD  primidone (MYSOLINE) 50 MG tablet TAKE 2 TABLETS BY MOUTH 3 TIMES DAILY. 01/04/16  Yes Marcial Pacas, MD  Probiotic Product (ALIGN) 4 MG CAPS Take 4 mg by mouth  daily.   Yes Historical Provider, MD  propranolol ER (INDERAL LA) 60 MG 24 hr capsule Take 1 capsule (60 mg total) by mouth daily. 10/30/15  Yes Marcial Pacas, MD  rosuvastatin (CRESTOR) 20 MG tablet Take 1 tablet (20 mg total) by mouth daily. Patient taking differently: Take 10 mg by mouth daily.  01/02/16  Yes Fay Records, MD  carvedilol (COREG) 3.125 MG tablet Take 1 tablet (3.125 mg total) by mouth daily. 12/22/15 12/27/15  Fay Records, MD   BP 142/70 mmHg  Pulse 81  Temp(Src) 98.1 F (36.7 C) (Oral)  Resp 18  SpO2 97% Physical Exam  Constitutional: He is oriented to person, place, and time. He appears well-developed and well-nourished.  HENT:  Head: Normocephalic and atraumatic.  There is significant swelling of the periorbital soft tissues of the left eye There is a laceration extending from the lateral canthus toward the temple. It is approximately 1.5 cm in length.  Eyes: EOM are normal.  The right eye appears normal. Pupil is reactive. The left eye is impossible to examine secondary to periorbital soft tissue swelling.  Neck: Normal range of motion.  Cardiovascular: Normal rate, regular rhythm, normal heart sounds and intact distal pulses.   Pulmonary/Chest: Effort normal and breath sounds  normal. No respiratory distress.  Abdominal: Soft. He exhibits no distension. There is no tenderness.  Musculoskeletal: Normal range of motion.  There is some swelling and ecchymosis to the lateral left mid thigh.  Neurological: He is alert and oriented to person, place, and time.  Moves all extremities to command.  Skin: Skin is warm and dry.  Psychiatric: He has a normal mood and affect. Judgment normal.  Nursing note and vitals reviewed.   ED Course  Procedures (including critical care time)  DIAGNOSTIC STUDIES: Oxygen Saturation is 98% on RA, normal by my interpretation.    COORDINATION OF CARE: 3:36 AM Will order CT scans of head, face, and C-spine, x-rays of the left hip, left femur, labs. Discussed treatment plan with wife at bedside and wife agreed to plan.   Labs Review Labs Reviewed  CBC WITH DIFFERENTIAL/PLATELET - Abnormal; Notable for the following:    WBC 11.1 (*)    RBC 3.80 (*)    Hemoglobin 11.7 (*)    HCT 35.9 (*)    Neutro Abs 9.4 (*)    All other components within normal limits  CBG MONITORING, ED - Abnormal; Notable for the following:    Glucose-Capillary >600 (*)    All other components within normal limits  COMPREHENSIVE METABOLIC PANEL    Imaging Review No results found. I have personally reviewed and evaluated these images and lab results as part of my medical decision-making.    MDM   Final diagnoses:  None    Patient is a 75 year old male who was found by his wife to have suffered some sort of facial injury while she was at work. She last spoke to him at approximately 7 PM, and when she returned from work shortly after 11 PM found him to have significant swelling and bruising around the left eye. He appeared somewhat confused and agitated and she had him brought here for evaluation.  His workup reveals an acute traumatic subarachnoid hemorrhage, subdural hemorrhage, and acute intraventricular hemorrhage. There is no evidence for globe  injury or retrobulbar hematoma. Cervical spine imaging was unremarkable. I discussed these findings with Dr. Redmond Pulling from trauma surgery who is recommending the patient be transferred to [facility] to undergo neurosurgical  consultation, trauma consultation, ophthalmology consultation, and internal medicine evaluation as the patient's blood sugar is markedly elevated. He was given saline and NovoLog while in the ED.  Patient accepted in transfer by Dr. Rex Kras in the emergency department at [facility]. I have also discussed the case with Dr. Cyndy Freeze from neurosurgery who will consult on the patient once he has arrived at [facility].  CRITICAL CARE Performed by: Veryl Speak Total critical care time: 60 minutes Critical care time was exclusive of separately billable procedures and treating other patients. Critical care was necessary to treat or prevent imminent or life-threatening deterioration. Critical care was time spent personally by me on the following activities: development of treatment plan with patient and/or surrogate as well as nursing, discussions with consultants, evaluation of patient's response to treatment, examination of patient, obtaining history from patient or surrogate, ordering and performing treatments and interventions, ordering and review of laboratory studies, ordering and review of radiographic studies, pulse oximetry and re-evaluation of patient's condition.   I personally performed the services described in this documentation, which was scribed in my presence. The recorded information has been reviewed and is accurate.       Veryl Speak, MD 02/13/16 [id number]  Veryl Speak, MD 02/13/16 [id number]  Veryl Speak, MD 02/13/16 [phone]

## 2016-02-13 NOTE — ED Notes (Signed)
Trauma Surgery PA at bedside.

## 2016-02-13 NOTE — Consult Note (Signed)
Ophthalmology Initial Consult Note  Dale Morrow, Dale Morrow, 75 y.o. male Date of Service:  02/13/2016  Requesting physician: Trauma Md, MD  Information Obtained from: patient and spouse Chief Complaint:  Unwitnessed fall, lid laceration  HPI/Discussion:  Dale Morrow is a 75 y.o. male who presented to [facility] ER and was subsequently transferred to [facility] after an unwitnessed fall in which he sustained a left periorbital contusion and large laceration in left lateral canthal region. He was also noted to have subdural/subarachnoid hemorrhages for which he is being evaluated. Ophthalmology was consulted to evaluate the extent of ocular injury. Pt does not recall falling (+LOC?). He is a patient of Dr. Herbert Deaner and is s/p CE/IOL OU a few years ago. He has no other past ocular history. He has no complaints OD. He cannot see anything OS, although his lids are notably swollen and completely obscuring his visual axis. He also notes mild pain around OS.  Past Ocular Hx:  Pseudophakia OU Ocular Meds:  None Family ocular history: Noncontributory  Past Medical History  Diagnosis Date  . Arthritis     left knee  . Depression   . De Quervain's tenosynovitis, left 04/2012  . Tremors of nervous system     takes propranolol for tremors  . Prostate cancer, primary, with metastasis from prostate to other site [facility])     a. metastasis to lymph nodes;  b. s/p XRT.  [name] Complication of anesthesia     pt denies ponv, pt woke up during colonscopy in past  . Diabetes mellitus     Insulin pump, takes lisinopril for kidneys, dr Buddy Duty endocrinologist  . Left-sided Bell's palsy   . Right-sided Bell's palsy   . Nonischemic cardiomyopathy ([place])     a. 04/2015 Echo: EF 25-30%, mid apical, antseptal, anterolat, inflat, infsept, apical AK. ? takotsubo. No thrombus. PASP 47mmHg;  b. 04/2014 Lexi MV: EF 29%, fixed mod size and intensity mid-dist ant, apical, inferior scar w/ wma's;  c. 04/2015 Cath: LM nl, LAD 50p/m, LCX 25p, RI nl, RCA  nl, RPAV 50d-->Med Rx.  . Chronic systolic CHF (congestive heart failure) ([place])     a. 04/2015 Echo: EF 25-30%.  [name] GERD (gastroesophageal reflux disease)    Past Surgical History  Procedure Laterality Date  . Hemorrhoid surgery  1970s  . Anterior cruciate ligament repair      right knee  . Wrist arthroscopy  05/03/2010    right; and release of 1st dorsal compartment  . Knee arthrotomy  03/18/2008    left; with scar exc.  [name] Knee arthroscopy  09/25/2007; 04/15/2005    left  . Knee joint manipulation  06/29/2007    left  . Total knee arthroplasty  04/29/2007    left  . Dorsal compartment release  04/21/2012    Procedure: RELEASE DORSAL COMPARTMENT (DEQUERVAIN);  Surgeon: Cammie Sickle., MD;  Location: [facility];  Service: Orthopedics;  Laterality: Left;  left 1st dorsal compartment release left wrist   . Minor amputation of digit Left 07/21/2013    Procedure: LEFT SMALL REVISION AMPUTATION (MINOR PROCEDURE) ;  Surgeon: Schuyler Amor, MD;  Location: [place];  Service: Orthopedics;  Laterality: Left;  . Colonoscopy with propofol N/A 04/07/2014    Procedure: COLONOSCOPY WITH PROPOFOL;  Surgeon: Jeryl Columbia, MD;  [facility];  Service: Endoscopy;  Laterality: N/A;  . Total knee revision Left 01/25/2015    Procedure: LEFT TOTAL KNEE  ARTHOPLASTY REVISION;  Surgeon: Pilar Plate  Aluisio, MD;  [facility];  Service: Orthopedics;  Laterality: Left;  . Cardiac catheterization N/A 04/26/2015    Procedure: Left Heart Cath and Coronary Angiography;  Surgeon: Jettie Booze, MD;  Location: [place] [facility];  Service: Cardiovascular;  Laterality: N/A;  . Cardiac catheterization  04/26/2015    Procedure: Intravascular Ultrasound/IVUS;  Surgeon: Jettie Booze, MD;  Location: [place] [facility];  Service: Cardiovascular;;  . Carpal tunnel release Left 07/05/2015    Procedure: LEFT CARPAL TUNNEL RELEASE;  Surgeon: Charlotte Crumb, MD;  Location: [place];  Service: Orthopedics;  Laterality: Left;  . Ulnar tunnel release Left 07/05/2015    Procedure: LEFT CUBITAL TUNNEL RELEASE;  Surgeon: Charlotte Crumb, MD;  Location: [place];  Service: Orthopedics;  Laterality: Left;    Prior to Admission Meds:  (Not in a hospital admission)  Inpatient Meds: @IPMEDS @  Allergies  Allergen Reactions  . Bee Venom Anaphylaxis  . Shellfish Allergy Anaphylaxis  . Augmentin [Amoxicillin-Pot Clavulanate] Rash   Social History  Substance Use Topics  . Smoking status: Former Smoker -- 15 years    Types: Cigarettes    Quit date: 10/13/1979  . Smokeless tobacco: Never Used  . Alcohol Use: No   Family History  Problem Relation Age of Onset  . Heart Problems Father   . Heart attack Father   . Hypertension Mother   . Stroke Mother     ROS: Other than ROS in the HPI, all other systems were negative.  Exam: Temp: 98.5 F (36.9 C) Pulse Rate: 68 BP: 120/63 mmHg Resp: 11 SpO2: 97 %  Visual Acuity:  near   OD 20/30   OS 20/40      OD OS  Confr Vis Fields Full Grossly full (difficult with lid speculum in place)  EOM (Primary) Full Mild limitation in all gazes  Lids/Lashes Normal 3+ upper and lower lid edema and ecchymosis, large horizontal laceration just temporal to lateral canthus and extending temporally  Conjunctiva Normal 3+ hemorrhagic chemosis  Adnexa  Normal Laceration as above  Pupils  3-->2, brisk, no rAPD 3-->2, somewhat brisk, no rAPD  Cornea  Clear Clear  Anterior Chamber Formed Formed, no hyphema  Lens:  IOL IOL  IOP 18 33  Fundus - Dilated? NO - deferred 2/2 SAH and SDH    Neuro:  Oriented to person and place:  Yes Psychiatric:  Mood and Affect Appropriate:  Yes  Labs/imaging:  CT face:  1. Acute traumatic subarachnoid hemorrhage involving both cerebral hemispheres as above, with additional 13 mm parenchymal contusion at the left temporal pole. 2. Small volume acute subdural  hemorrhage overlying the right frontal convexity and falx. 3. Small volume acute intraventricular hemorrhage within the occipital horns of both lateral ventricles. This may be related to redistribution. No hydrocephalus or midline shift. 4. Large left frontal/periorbital scalp contusion. No calvarial fracture. 5. No acute maxillofacial fracture. 6. Large left forehead/periorbital and facial contusion. There is associated left enophthalmos. Left globe itself appears grossly intact without retro-orbital hematoma or other pathology.  A/P:  75 y.o. male with severe left periorbital contusion/edema/ecchymosis and left lid laceration lateral to lateral canthal region 2/2 unwitnessed fall  1) Periorbital contusion/edema - 2/2 fall - Recommend cold compressed x20 minutes QID for first 48 hours, then warm compresses PRN thereafter.  2) Ocular HTN OS - Start dorzolamide-timolol BID OS.  3) Left lateral canthal laceration - Bacitracin ophthalmic ointment to laceration for now. - Will plan on  suture repair later this evening.  Pt will need full dilated exam when he is deemed stable from a neurological perspective. This can be arranged on an outpatient basis or while he is still in the hospital. I will plan on reevaluating him and repairing the lid laceration later this evening.  R Wyatt Portela, MD [address], [place], [place], [place] [address] [phone]  R Wyatt Portela, MD 02/13/2016, 2:15 PM

## 2016-02-13 NOTE — Op Note (Signed)
PROCEDURE NOTE - OPHTHALMOLOGY  Dale Morrow 04/21/1941 75 y.o. male  Date of Procedure: 02/13/2016 Surgeon: Doran Stabler, MD  Diagnosis: Left periorbital laceration (2cm long) directly over the lateral canthus and extending temporally Procedure: Repair of left periorbital laceration  Background/Indications: [name] is a 75 year old man who presented to [facility] and was subsequently transferred to [facility] after an unwitnessed fall in which he sustained significant periorbital edema on the left side and a 2 cm laceration in the lateral canthal region. After careful inspection, the right globe was notably intact. The laceration began almost immediately temporal to where the upper and lower lid margins merge temporally. The margin itself did not appear to be involved. The decision was made to close the wound.  Anesthesia: Local Complications: None  Description: The eyelid was anesthetized with 2% lidocaine and cleaned with sterile saline and 10% betadine solution. The laceration was closed with 4 simple interrupted sutures (5-0 Vicryl). Bacitracin-polymyxin ointment was then placed on the lesion.  Instructions: Okay for discharge home from ophthalmology's perspective once cleared by neurosurgery. Please continue antibiotic ointment TID and dorzolamide-timolol drops BID (left eye only). Pt will need full dilated exam a few days after discharge. I gave his wife my clinic information.  R Wyatt Portela, MD [address], [place], [place], [place] [address] [phone]

## 2016-02-13 NOTE — ED Notes (Signed)
Bed: [id number] Expected date:  Expected time:  Means of arrival:  Comments: 75 yo M  Hyperglycmeia, fall, femur fx

## 2016-02-13 NOTE — ED Notes (Signed)
Patient transported to X-ray 

## 2016-02-13 NOTE — ED Notes (Signed)
Pt wife notified RN that pt climbed out of end of stretcher. Pt found to be ambulating in room, attempting to disconnect IV tubing. Discussed with pt the need to stay in stretcher. IV wrapped with gauze. Pt verbalized understanding of instructions to remain in stretcher and leave IV site alone. Wife also aware and at bedside at this time.   Pt still remains confused as to year, month, location, age, situation. Pt also has short-term memory loss.

## 2016-02-13 NOTE — ED Notes (Signed)
Discussed plan of care with Dr. Katy Fitch. Pt does not need surgery at this time. Family and pt aware.

## 2016-02-13 NOTE — ED Notes (Signed)
Please update Dr. Jairo Ben with Ophthalmology at [phone]

## 2016-02-13 NOTE — ED Notes (Signed)
Per EMS pt's wife said she had spoke to pt around 1900 and he was A&O x 4.  Pt's wife returned home midnight and found coagulated blood on the landing for the stairs then found pt upstairs in the bathroom.  Per EMS pt has a laceration as well as hematoma to the left eye.  Pt is oriented to person only.  Deformity noted to left femur.  Pt's CBG registered >600 in the field.

## 2016-02-13 NOTE — Consult Note (Signed)
Reason for Consult:Traumatic Brain injury Referring Physician: ED  Dale Morrow is an 75 y.o. male.  HPI: 75 year old male status post fall around midnight. Unknown loss of consciousness. Patient was found a few hours later by his wife sleeping in bed. He was arousable. He was able to stand and made it to the bathroom. He became progressively confused and weaker. EMS transported to emergency department for evaluation. Patient history with significant labile diabetes. Patient with baseline tremor and some gait instability. Also fairly recently started on Lyrica. Etiology of her fall unclear. Patient with moderate confusion and agitation while in the emergency department. No evidence of seizure. No evidence of focal weakness. Head CT scan with diffuse traumatic subarachnoid blood and multiple areas of small cortical contusion. No focal clots or significant mass effect.  Past Medical History  Diagnosis Date  . Arthritis     left knee  . Depression   . De Quervain's tenosynovitis, left 04/2012  . Tremors of nervous system     takes propranolol for tremors  . Prostate cancer, primary, with metastasis from prostate to other site [facility])     a. metastasis to lymph nodes;  b. s/p XRT.  [name] Complication of anesthesia     pt denies ponv, pt woke up during colonscopy in past  . Diabetes mellitus     Insulin pump, takes lisinopril for kidneys, dr Buddy Duty endocrinologist  . Left-sided Bell's palsy   . Right-sided Bell's palsy   . Nonischemic cardiomyopathy ([place])     a. 04/2015 Echo: EF 25-30%, mid apical, antseptal, anterolat, inflat, infsept, apical AK. ? takotsubo. No thrombus. PASP 43mHg;  b. 04/2014 Lexi MV: EF 29%, fixed mod size and intensity mid-dist ant, apical, inferior scar w/ wma's;  c. 04/2015 Cath: LM nl, LAD 50p/m, LCX 25p, RI nl, RCA nl, RPAV 50d-->Med Rx.  . [place]estive [place]     a. 04/2015 Echo: EF 25-30%.  .Marland KitchenGERD (gastroesophageal reflux disease)     Past  Surgical History  Procedure Laterality Date  . Hemorrhoid surgery  1970s  . Anterior cruciate ligament repair      right knee  . Wrist arthroscopy  05/03/2010    right; and release of 1st dorsal compartment  . Knee arthrotomy  03/18/2008    left; with scar exc.  .Marland KitchenKnee arthroscopy  09/25/2007; 04/15/2005    left  . Knee joint manipulation  06/29/2007    left  . Total knee arthroplasty  04/29/2007    left  . Dorsal compartment release  04/21/2012    Procedure: RELEASE DORSAL COMPARTMENT (DEQUERVAIN);  Surgeon: RCammie Sickle, MD;  [facility]  Service: Orthopedics;  Laterality: Left;  left 1st dorsal compartment release left wrist   . Minor amputation of digit Left 07/21/2013    Procedure: LEFT SMALL REVISION AMPUTATION (MINOR PROCEDURE) ;  Surgeon: MSchuyler Amor MD;  [facility]  Service: Orthopedics;  Laterality: Left;  . Colonoscopy with propofol N/A 04/07/2014    Procedure: COLONOSCOPY WITH PROPOFOL;  Surgeon: MJeryl Columbia MD;  Location: [facility];  Service: Endoscopy;  Laterality: N/A;  . Total knee revision Left 01/25/2015    Procedure: LEFT TOTAL KNEE  ARTHOPLASTY REVISION;  Surgeon: FGaynelle Arabian MD;  Location: [facility] OR[facility];  Service: Orthopedics;  Laterality: Left;  . Cardiac catheterization N/A 04/26/2015    Procedure: Left Heart Cath and Coronary Angiography;  Surgeon: JJettie Booze MD;  [facility];  Service: Cardiovascular;  Laterality: N/A;  . Cardiac catheterization  04/26/2015    Procedure: Intravascular Ultrasound/IVUS;  Surgeon: Jettie Booze, MD;  Location: [place] CV LAB;  Service: Cardiovascular;;  . Carpal tunnel release Left 07/05/2015    Procedure: LEFT CARPAL TUNNEL RELEASE;  Surgeon: Charlotte Crumb, MD;  Location: [place];  Service: Orthopedics;  Laterality: Left;  . Ulnar tunnel release Left 07/05/2015    Procedure: LEFT CUBITAL TUNNEL RELEASE;  Surgeon: Charlotte Crumb, MD;  Location: [place];  Service: Orthopedics;  Laterality: Left;    Family History  Problem Relation Age of Onset  . Heart Problems Father   . Heart attack Father   . Hypertension Mother   . Stroke Mother     Social History:  reports that he quit smoking about 36 years ago. His smoking use included Cigarettes. He quit after 15 years of use. He has never used smokeless tobacco. He reports that he does not drink alcohol or use illicit drugs.  Allergies:  Allergies  Allergen Reactions  . Bee Venom Anaphylaxis  . Shellfish Allergy Anaphylaxis  . Augmentin [Amoxicillin-Pot Clavulanate] Rash    Medications: I have reviewed the patient's current medications.  Results for orders placed or performed during the hospital encounter of 02/13/16 (from the past 48 hour(s))  CBC with Differential     Status: Abnormal   Collection Time: 02/13/16  3:22 AM  Result Value Ref Range   WBC 11.1 (H) 4.0 - 10.5 K/uL   RBC 3.80 (L) 4.22 - 5.81 MIL/uL   Hemoglobin 11.7 (L) 13.0 - 17.0 g/dL   HCT 35.9 (L) 39.0 - 52.0 %   MCV 94.5 78.0 - 100.0 fL   MCH 30.8 26.0 - 34.0 pg   MCHC 32.6 30.0 - 36.0 g/dL   RDW 13.8 11.5 - 15.5 %   Platelets 202 150 - 400 K/uL   Neutrophils Relative % 84 %   Neutro Abs 9.4 (H) 1.7 - 7.7 K/uL   Lymphocytes Relative 9 %   Lymphs Abs 1.0 0.7 - 4.0 K/uL   Monocytes Relative 6 %   Monocytes Absolute 0.6 0.1 - 1.0 K/uL   Eosinophils Relative 1 %   Eosinophils Absolute 0.1 0.0 - 0.7 K/uL   Basophils Relative 0 %   Basophils Absolute 0.0 0.0 - 0.1 K/uL  Comprehensive metabolic panel     Status: Abnormal   Collection Time: 02/13/16  3:22 AM  Result Value Ref Range   Sodium 136 135 - 145 mmol/L   Potassium 5.8 (H) 3.5 - 5.1 mmol/L   Chloride 99 (L) 101 - 111 mmol/L   CO2 22 22 - 32 mmol/L   Glucose, Bld 605 (HH) 65 - 99 mg/dL    Comment: CRITICAL RESULT CALLED TO, READ BACK BY AND VERIFIED WITH: Candiss Norse RN [id number] 02/13/16 A NAVARRO    BUN 30  (H) 6 - 20 mg/dL   Creatinine, Ser 1.20 0.61 - 1.24 mg/dL   Calcium 8.8 (L) 8.9 - 10.3 mg/dL   Total Protein 7.3 6.5 - 8.1 g/dL   Albumin 4.0 3.5 - 5.0 g/dL   AST 53 (H) 15 - 41 U/L   ALT 34 17 - 63 U/L   Alkaline Phosphatase 84 38 - 126 U/L   Total Bilirubin 1.2 0.3 - 1.2 mg/dL   GFR calc non Af Amer 58 (L) >60 mL/min   GFR calc Af Amer >60 >60 mL/min    Comment: (  NOTE) The eGFR has been calculated using the CKD EPI equation. This calculation has not been validated in all clinical situations. eGFR's persistently <60 mL/min signify possible Chronic Kidney Disease.    Anion gap 15 5 - 15  CBG monitoring, ED     Status: Abnormal   Collection Time: 02/13/16  3:22 AM  Result Value Ref Range   Glucose-Capillary >600 (HH) 65 - 99 mg/dL  CBG monitoring, ED     Status: Abnormal   Collection Time: 02/13/16  7:38 AM  Result Value Ref Range   Glucose-Capillary 364 (H) 65 - 99 mg/dL  CBG monitoring, ED     Status: Abnormal   Collection Time: 02/13/16  8:16 AM  Result Value Ref Range   Glucose-Capillary 366 (H) 65 - 99 mg/dL  Basic metabolic panel     Status: Abnormal   Collection Time: 02/13/16  8:21 AM  Result Value Ref Range   Sodium 139 135 - 145 mmol/L   Potassium 4.7 3.5 - 5.1 mmol/L   Chloride 104 101 - 111 mmol/L   CO2 20 (L) 22 - 32 mmol/L   Glucose, Bld 355 (H) 65 - 99 mg/dL   BUN 22 (H) 6 - 20 mg/dL   Creatinine, Ser 1.10 0.61 - 1.24 mg/dL   Calcium 8.4 (L) 8.9 - 10.3 mg/dL   GFR calc non Af Amer >60 >60 mL/min   GFR calc Af Amer >60 >60 mL/min    Comment: (NOTE) The eGFR has been calculated using the CKD EPI equation. This calculation has not been validated in all clinical situations. eGFR's persistently <60 mL/min signify possible Chronic Kidney Disease.    Anion gap 15 5 - 15  CBC with Differential/Platelet     Status: Abnormal   Collection Time: 02/13/16  8:21 AM  Result Value Ref Range   WBC 8.0 4.0 - 10.5 K/uL   RBC 3.39 (L) 4.22 - 5.81 MIL/uL   Hemoglobin  10.4 (L) 13.0 - 17.0 g/dL   HCT 31.3 (L) 39.0 - 52.0 %   MCV 92.3 78.0 - 100.0 fL   MCH 30.7 26.0 - 34.0 pg   MCHC 33.2 30.0 - 36.0 g/dL   RDW 14.0 11.5 - 15.5 %   Platelets 201 150 - 400 K/uL   Neutrophils Relative % 81 %   Neutro Abs 6.5 1.7 - 7.7 K/uL   Lymphocytes Relative 11 %   Lymphs Abs 0.9 0.7 - 4.0 K/uL   Monocytes Relative 8 %   Monocytes Absolute 0.6 0.1 - 1.0 K/uL   Eosinophils Relative 0 %   Eosinophils Absolute 0.0 0.0 - 0.7 K/uL   Basophils Relative 0 %   Basophils Absolute 0.0 0.0 - 0.1 K/uL  Protime-INR     Status: Abnormal   Collection Time: 02/13/16  8:21 AM  Result Value Ref Range   Prothrombin Time 15.7 (H) 11.6 - 15.2 seconds   INR 1.23 0.00 - 1.49  Urinalysis, Routine w reflex microscopic (not at [facility])     Status: Abnormal   Collection Time: 02/13/16 10:45 AM  Result Value Ref Range   Color, Urine YELLOW YELLOW   APPearance CLEAR CLEAR   Specific Gravity, Urine 1.032 (H) 1.005 - 1.030   pH 5.0 5.0 - 8.0   Glucose, UA >1000 (A) NEGATIVE mg/dL   Hgb urine dipstick NEGATIVE NEGATIVE   Bilirubin Urine NEGATIVE NEGATIVE   Ketones, ur >80 (A) NEGATIVE mg/dL   Protein, ur NEGATIVE NEGATIVE mg/dL   Nitrite NEGATIVE NEGATIVE  Leukocytes, UA NEGATIVE NEGATIVE  Urine microscopic-add on     Status: Abnormal   Collection Time: 02/13/16 10:45 AM  Result Value Ref Range   Squamous Epithelial / LPF 0-5 (A) NONE SEEN   WBC, UA 0-5 0 - 5 WBC/hpf   RBC / HPF 0-5 0 - 5 RBC/hpf   Bacteria, UA RARE (A) NONE SEEN   Casts HYALINE CASTS (A) NEGATIVE  CBG monitoring, ED     Status: Abnormal   Collection Time: 02/13/16 11:36 AM  Result Value Ref Range   Glucose-Capillary 277 (H) 65 - 99 mg/dL    Dg Chest 1 View  02/13/2016  CLINICAL DATA:  Increasing leg pain after a fall yesterday. EXAM: CHEST 1 VIEW COMPARISON:  04/21/2015 FINDINGS: Shallow inspiration. Normal heart size and pulmonary vascularity. No focal airspace disease or consolidation in the lungs. No blunting  of costophrenic angles. No pneumothorax. Mediastinal contours appear intact. IMPRESSION: No active disease. Electronically Signed   By: Lucienne Capers M.D.   On: 02/13/2016 04:44   Ct Head Wo Contrast  02/13/2016  CLINICAL DATA:  Initial evaluation for acute trauma, fall. EXAM: CT HEAD WITHOUT CONTRAST CT MAXILLOFACIAL WITHOUT CONTRAST CT CERVICAL SPINE WITHOUT CONTRAST TECHNIQUE: Multidetector CT imaging of the head, cervical spine, and maxillofacial structures were performed using the standard protocol without intravenous contrast. Multiplanar CT image reconstructions of the cervical spine and maxillofacial structures were also generated. COMPARISON:  Prior study from 04/19/2015. FINDINGS: CT HEAD FINDINGS Large left periorbital and frontal scalp contusion. Globes intact. No retro-orbital hematoma or other pathology. No calvarial fracture.  Mastoid air cells are clear. Age-related cerebral atrophy with chronic microvascular ischemic disease present. No acute large vessel territory infarct. Acute subdural/ extra-axial hemorrhage overlies the right frontal convexity measuring up to 11 mm. Scattered subdural blood along the falx with a left parafalcine subdural hematoma measuring up to 4 mm. There is associated scattered acute subarachnoid hemorrhage within the right frontal lobe and parasagittal left frontal parietal lobes. Subarachnoid blood along the superior aspect of the left sylvian fissure. 13 mm contusion at the left anterior temporal pole. Small amount of hemorrhage along the septum pellucidum measures up to 9 mm. Acute subarachnoid hemorrhage within the posterior right temporal lobe. No midline shift. No hydrocephalus. Small amount of layering intraventricular hemorrhage within the occipital horns bilaterally. Basilar cisterns remain patent. Superimposed chronic appearing bilateral subdural hygroma is measure up to 5-6 mm bilaterally. CT MAXILLOFACIAL FINDINGS Large left forehead/left periorbital and  facial contusion. Mild left-sided enophthalmos. The left globe itself appears intact without retro-orbital hematoma or other pathology. Right globe intact without retro-orbital pathology. Bony orbits intact. No orbital floor fracture. Lamina papyracea intact. Orbital roofs intact. Zygomatic arches intact. No acute maxillary fracture. Pterygoid plates intact. No acute nasal bone fracture. Mandible intact. Mandibular condyles normally situated within the temporomandibular fossa. Paranasal and this is are largely clear. CT CERVICAL SPINE FINDINGS Trace anterolisthesis of C4 on C5, likely chronic. The vertebral bodies are otherwise normally aligned with preservation of the normal cervical lordosis. Vertebral body heights are preserved. Normal C1-2 articulations are intact. No prevertebral soft tissue swelling. No acute fracture or listhesis. Moderate degenerative spondylolysis at C5-6 and C6-7. Scatter multilevel facet arthrosis noted. Visualized soft tissues of the neck demonstrate no acute abnormality. Scattered vascular calcifications about the carotid bifurcations. Visualized lung apices are clear without evidence of apical pneumothorax. IMPRESSION: CT HEAD: 1. Acute traumatic subarachnoid hemorrhage involving both cerebral hemispheres as above, with additional 13 mm parenchymal contusion at the  left temporal pole. 2. Small volume acute subdural hemorrhage overlying the right frontal convexity and falx. 3. Small volume acute intraventricular hemorrhage within the occipital horns of both lateral ventricles. This may be related to redistribution. No hydrocephalus or midline shift. 4. Large left frontal/periorbital scalp contusion. No calvarial fracture. CT MAXILLOFACIAL: 1. No acute maxillofacial fracture. 2. Large left forehead/periorbital and facial contusion. There is associated left enophthalmos. Left globe itself appears grossly intact without retro-orbital hematoma or other pathology. CT CERVICAL SPINE: No acute  traumatic injury within the cervical spine. Critical Value/emergent results were called by telephone at the time of interpretation on 02/13/2016 at 5:21 am to Dr. Veryl Speak , who verbally acknowledged these results. Electronically Signed   By: Jeannine Boga M.D.   On: 02/13/2016 05:23   Ct Cervical Spine Wo Contrast  02/13/2016  CLINICAL DATA:  Initial evaluation for acute trauma, fall. EXAM: CT HEAD WITHOUT CONTRAST CT MAXILLOFACIAL WITHOUT CONTRAST CT CERVICAL SPINE WITHOUT CONTRAST TECHNIQUE: Multidetector CT imaging of the head, cervical spine, and maxillofacial structures were performed using the standard protocol without intravenous contrast. Multiplanar CT image reconstructions of the cervical spine and maxillofacial structures were also generated. COMPARISON:  Prior study from 04/19/2015. FINDINGS: CT HEAD FINDINGS Large left periorbital and frontal scalp contusion. Globes intact. No retro-orbital hematoma or other pathology. No calvarial fracture.  Mastoid air cells are clear. Age-related cerebral atrophy with chronic microvascular ischemic disease present. No acute large vessel territory infarct. Acute subdural/ extra-axial hemorrhage overlies the right frontal convexity measuring up to 11 mm. Scattered subdural blood along the falx with a left parafalcine subdural hematoma measuring up to 4 mm. There is associated scattered acute subarachnoid hemorrhage within the right frontal lobe and parasagittal left frontal parietal lobes. Subarachnoid blood along the superior aspect of the left sylvian fissure. 13 mm contusion at the left anterior temporal pole. Small amount of hemorrhage along the septum pellucidum measures up to 9 mm. Acute subarachnoid hemorrhage within the posterior right temporal lobe. No midline shift. No hydrocephalus. Small amount of layering intraventricular hemorrhage within the occipital horns bilaterally. Basilar cisterns remain patent. Superimposed chronic appearing bilateral  subdural hygroma is measure up to 5-6 mm bilaterally. CT MAXILLOFACIAL FINDINGS Large left forehead/left periorbital and facial contusion. Mild left-sided enophthalmos. The left globe itself appears intact without retro-orbital hematoma or other pathology. Right globe intact without retro-orbital pathology. Bony orbits intact. No orbital floor fracture. Lamina papyracea intact. Orbital roofs intact. Zygomatic arches intact. No acute maxillary fracture. Pterygoid plates intact. No acute nasal bone fracture. Mandible intact. Mandibular condyles normally situated within the temporomandibular fossa. Paranasal and this is are largely clear. CT CERVICAL SPINE FINDINGS Trace anterolisthesis of C4 on C5, likely chronic. The vertebral bodies are otherwise normally aligned with preservation of the normal cervical lordosis. Vertebral body heights are preserved. Normal C1-2 articulations are intact. No prevertebral soft tissue swelling. No acute fracture or listhesis. Moderate degenerative spondylolysis at C5-6 and C6-7. Scatter multilevel facet arthrosis noted. Visualized soft tissues of the neck demonstrate no acute abnormality. Scattered vascular calcifications about the carotid bifurcations. Visualized lung apices are clear without evidence of apical pneumothorax. IMPRESSION: CT HEAD: 1. Acute traumatic subarachnoid hemorrhage involving both cerebral hemispheres as above, with additional 13 mm parenchymal contusion at the left temporal pole. 2. Small volume acute subdural hemorrhage overlying the right frontal convexity and falx. 3. Small volume acute intraventricular hemorrhage within the occipital horns of both lateral ventricles. This may be related to redistribution. No hydrocephalus or midline shift.  4. Large left frontal/periorbital scalp contusion. No calvarial fracture. CT MAXILLOFACIAL: 1. No acute maxillofacial fracture. 2. Large left forehead/periorbital and facial contusion. There is associated left  enophthalmos. Left globe itself appears grossly intact without retro-orbital hematoma or other pathology. CT CERVICAL SPINE: No acute traumatic injury within the cervical spine. Critical Value/emergent results were called by telephone at the time of interpretation on 02/13/2016 at 5:21 am to Dr. Veryl Speak , who verbally acknowledged these results. Electronically Signed   By: Jeannine Boga M.D.   On: 02/13/2016 05:23   Dg Hip Unilat With Pelvis 2-3 Views Left  02/13/2016  CLINICAL DATA:  Increasing pain after a fall. EXAM: DG HIP (WITH OR WITHOUT PELVIS) 2-3V LEFT COMPARISON:  None. FINDINGS: Pelvis and left hip appear intact. No evidence of acute fracture or dislocation. No focal bone lesion or bone destruction. Vascular calcifications. Surgical clips over the symphysis pubis. IMPRESSION: No acute bony abnormalities. Electronically Signed   By: Lucienne Capers M.D.   On: 02/13/2016 04:45   Dg Femur Min 2 Views Left  02/13/2016  CLINICAL DATA:  Increasing pain and difficulty weight-bearing after a fall late afternoon of 02/12/2016. EXAM: LEFT FEMUR 2 VIEWS COMPARISON:  None. FINDINGS: Left total knee arthroplasty. No evidence of acute fracture or dislocation. No focal bone lesion or bone destruction. Soft tissues are unremarkable. IMPRESSION: No acute bony abnormalities. Electronically Signed   By: Lucienne Capers M.D.   On: 02/13/2016 04:44   Ct Maxillofacial Wo Cm  02/13/2016  CLINICAL DATA:  Initial evaluation for acute trauma, fall. EXAM: CT HEAD WITHOUT CONTRAST CT MAXILLOFACIAL WITHOUT CONTRAST CT CERVICAL SPINE WITHOUT CONTRAST TECHNIQUE: Multidetector CT imaging of the head, cervical spine, and maxillofacial structures were performed using the standard protocol without intravenous contrast. Multiplanar CT image reconstructions of the cervical spine and maxillofacial structures were also generated. COMPARISON:  Prior study from 04/19/2015. FINDINGS: CT HEAD FINDINGS Large left periorbital and  frontal scalp contusion. Globes intact. No retro-orbital hematoma or other pathology. No calvarial fracture.  Mastoid air cells are clear. Age-related cerebral atrophy with chronic microvascular ischemic disease present. No acute large vessel territory infarct. Acute subdural/ extra-axial hemorrhage overlies the right frontal convexity measuring up to 11 mm. Scattered subdural blood along the falx with a left parafalcine subdural hematoma measuring up to 4 mm. There is associated scattered acute subarachnoid hemorrhage within the right frontal lobe and parasagittal left frontal parietal lobes. Subarachnoid blood along the superior aspect of the left sylvian fissure. 13 mm contusion at the left anterior temporal pole. Small amount of hemorrhage along the septum pellucidum measures up to 9 mm. Acute subarachnoid hemorrhage within the posterior right temporal lobe. No midline shift. No hydrocephalus. Small amount of layering intraventricular hemorrhage within the occipital horns bilaterally. Basilar cisterns remain patent. Superimposed chronic appearing bilateral subdural hygroma is measure up to 5-6 mm bilaterally. CT MAXILLOFACIAL FINDINGS Large left forehead/left periorbital and facial contusion. Mild left-sided enophthalmos. The left globe itself appears intact without retro-orbital hematoma or other pathology. Right globe intact without retro-orbital pathology. Bony orbits intact. No orbital floor fracture. Lamina papyracea intact. Orbital roofs intact. Zygomatic arches intact. No acute maxillary fracture. Pterygoid plates intact. No acute nasal bone fracture. Mandible intact. Mandibular condyles normally situated within the temporomandibular fossa. Paranasal and this is are largely clear. CT CERVICAL SPINE FINDINGS Trace anterolisthesis of C4 on C5, likely chronic. The vertebral bodies are otherwise normally aligned with preservation of the normal cervical lordosis. Vertebral body heights are preserved. Normal  C1-2 articulations are intact. No prevertebral soft tissue swelling. No acute fracture or listhesis. Moderate degenerative spondylolysis at C5-6 and C6-7. Scatter multilevel facet arthrosis noted. Visualized soft tissues of the neck demonstrate no acute abnormality. Scattered vascular calcifications about the carotid bifurcations. Visualized lung apices are clear without evidence of apical pneumothorax. IMPRESSION: CT HEAD: 1. Acute traumatic subarachnoid hemorrhage involving both cerebral hemispheres as above, with additional 13 mm parenchymal contusion at the left temporal pole. 2. Small volume acute subdural hemorrhage overlying the right frontal convexity and falx. 3. Small volume acute intraventricular hemorrhage within the occipital horns of both lateral ventricles. This may be related to redistribution. No hydrocephalus or midline shift. 4. Large left frontal/periorbital scalp contusion. No calvarial fracture. CT MAXILLOFACIAL: 1. No acute maxillofacial fracture. 2. Large left forehead/periorbital and facial contusion. There is associated left enophthalmos. Left globe itself appears grossly intact without retro-orbital hematoma or other pathology. CT CERVICAL SPINE: No acute traumatic injury within the cervical spine. Critical Value/emergent results were called by telephone at the time of interpretation on 02/13/2016 at 5:21 am to Dr. Veryl Speak , who verbally acknowledged these results. Electronically Signed   By: Jeannine Boga M.D.   On: 02/13/2016 05:23     Blood pressure 120/63, pulse 68, temperature 98.5 F (36.9 C), temperature source Oral, resp. rate 11, SpO2 97 %. The patient is awake and aware. He is oriented to place and time. Not situation. His speech is reasonably fluent. His judgment and insight are lacking at present. Cranial nerve function reveals normal extraocular movements. Pupils are equal round and reactive at 4 mm. Facial movement and sensation symmetric. Temperature is  midline. Palate elevates to midline. Shoulder shrug equally. Motor 5/5 bilateral upper and lower extremities without evidence of pronator drift. Patient does have evidence of a resting tremor. Reflexes hypoactive but symmetric. No evidence of long track signs. Sensory examination nonfocal. Examination head ears eyes and throat demonstrates evidence of a large hematoma and periorbital swelling on the left. No obvious bony abnormality. Oropharynx nasopharynx and external auditory canals clear. Neck nontender. Airway midline. Chest and abdomen unremarkable. Extremities free from injury deformity.  Assessment/Plan  The patient has suffered  is significant traumatic brain injury with evidence of traumatic subarachnoid hemorrhage and multiple cortical contusions. There is a small right frontal subdural hematoma with no mass effect. No evidence of calvarial fracture. Recommend holding aspirin and ICU observation. Follow-up head CT scan in morning.   Zackaria Burkey A 02/13/2016, 1:43 PM

## 2016-02-13 NOTE — ED Notes (Signed)
[facility] arrived  

## 2016-02-13 NOTE — Consult Note (Signed)
Consultation note   Dale Morrow [id number] DOB: 1941-06-30 DOA: 02/13/2016  Requesting MD/NP/PA: Wyvonnia Dusky / ER Admitting M.D.: Hulen Skains / Trauma Service PCP: Jonathon Bellows, MD  Outpatient Specialists: Krista Blue / Neurology; Buddy Duty / Endocrinology; Harrington Challenger / Cardiology Patient coming from: Private residence  Chief Complaint: Unwitnessed fall at home with injuries  Reason for consultation: Dale Morrow is a 75 y.o. male with medical history significant for diabetes on insulin and utilizes an insulin pump at home, history of seizure-like activity secondary to severe hypoglycemia, stroke prostate cancer post radiation and hormone therapy, history of Takotsubo cardiomyopathy with moderate nonobstructive disease on cardiac catheterization July 2016, echocardiogram August 2016 with normalized systolic function with EF 55%, chronic essential tremor, chronic left facial paresthesia after episode of bilateral Bell's palsy, depression, osteoarthritis with prior failed TKA. Patient presented to the ER after experiencing an unwitnessed fall at home. Patient was amnestic post event regarding events preceding fall and currently is very confused and unable to contribute to history. Because of this unclear if patient had a mechanical fall or syncopal episode preceding the event. Evaluation has revealed traumatic brain injury with subcentimeter subarachnoid hemorrhage and subdural hematomas and neurosurgery to see. He also has significant left periorbital swelling with a laceration to the left lateral eye and has been evaluated by ophthalmology. He was initially at [facility] but due to significant injuries requiring trauma service as well as neurosurgery evaluation and treatment he was subsequently transferred to [facility] ER. At time of presentation patient's CBG was 600 with a normal anion gap. Patient on insulin pump at home and this was continued after arrival most recent CBG down to 355. We have been asked to  follow the patient from an internal medicine standpoint regarding current uncontrolled hyperglycemia with diabetes and to assist in the management of his other chronic medical problems.  ED Course:  Temperature 98.1-BP 167/67-pulse 64-respirations 18-room air saturations 98% Follow-up vital signs BP 114/55-pulse 71-respirations 16-room air saturations 100% PCXR: No active disease CT head, cervical spine maxillofacial without contrast: Large left periorbital and frontal scalp contusion with globes intact, no retro-orbital hematoma or other pathology, acute subdural/extra-axial hemorrhage overlying the right frontal convexity measuring 11 mm. Scattered subdural blood along the falx with the left parafalcine subdural hematoma measuring up to 4 mm with associated scattered acute subarachnoid hemorrhage within the right frontal lobe and parasagittal left frontal parietal lobes. There is also subarachnoid blood along the superior aspect of the left sylvian fissure. There is a 13 mm contusion at the left anterior temporal pole. Small amount of hemorrhage along the septum pellucidum measuring up to 9 mm. Also an acute subarachnoid hemorrhage within the posterior right temporal lobe; no midline shift and no hydrocephalus, there is a small amount of layering intraventricular hemorrhage within the occipital horns bilaterally; in regards to the eyes there is no bony injury or orbital floor fracture, no acute spinal injury. Lab data: Sodium 136, potassium 5.8 with repeat 4.7, BUN 30 and creatinine 1.2 with repeat after volume resuscitation BUN 22 and creatinine 1.1, initial glucose 605 with repeat down to 355, WBCs 11,100 with neutrophils 84% and absolute neutrophils 9.4%, hemoglobin 11.7, PT 15.7, INR 1.23 NovoLog insulin 10 units IV 1 Tdap injection 1 Saline bolus 1000 mL 1 Clindamycin 600 mg IV 1  Review of Systems:  In addition to the HPI above, **please note history of present illness Limited by patient's  current altered mentation and confusion No Fever-chills, myalgias or other constitutional symptoms  No Headache, changes with Vision or hearing, new weakness, tingling, numbness in any extremity, No problems swallowing food or Liquids, indigestion/reflux No Chest pain, Cough or Shortness of Breath, palpitations, orthopnea or DOE No Abdominal pain, N/V; no melena or hematochezia, no dark tarry stools, Bowel movements are regular, No dysuria, hematuria or flank pain No new skin rashes, lesions, masses or bruises-patient recently traveled to [facility] and has sunburn with areas of superficial skin peel in the extremities No recent weight gain or loss No polyuria, polydypsia or polyphagia,   Past Medical History  Diagnosis Date  . Arthritis     left knee  . Depression   . De Quervain's tenosynovitis, left 04/2012  . Tremors of nervous system     takes propranolol for tremors  . Prostate cancer, primary, with metastasis from prostate to other site [facility])     a. metastasis to lymph nodes;  b. s/p XRT.  [name] Complication of anesthesia     pt denies ponv, pt woke up during colonscopy in past  . Diabetes mellitus     Insulin pump, takes lisinopril for kidneys, dr Buddy Duty endocrinologist  . Left-sided Bell's palsy   . Right-sided Bell's palsy   . Nonischemic cardiomyopathy ([place])     a. 04/2015 Echo: EF 25-30%, mid apical, antseptal, anterolat, inflat, infsept, apical AK. ? takotsubo. No thrombus. PASP 16mmHg;  b. 04/2014 Lexi MV: EF 29%, fixed mod size and intensity mid-dist ant, apical, inferior scar w/ wma's;  c. 04/2015 Cath: LM nl, LAD 50p/m, LCX 25p, RI nl, RCA nl, RPAV 50d-->Med Rx.  . Chronic systolic CHF (congestive heart failure) ([place])     a. 04/2015 Echo: EF 25-30%.  [name] GERD (gastroesophageal reflux disease)     Past Surgical History  Procedure Laterality Date  . Hemorrhoid surgery  1970s  . Anterior cruciate ligament repair      right knee  . Wrist arthroscopy  05/03/2010    right; and release  of 1st dorsal compartment  . Knee arthrotomy  03/18/2008    left; with scar exc.  [name] Knee arthroscopy  09/25/2007; 04/15/2005    left  . Knee joint manipulation  06/29/2007    left  . Total knee arthroplasty  04/29/2007    left  . Dorsal compartment release  04/21/2012    Procedure: RELEASE DORSAL COMPARTMENT (DEQUERVAIN);  Surgeon: Cammie Sickle., MD;  Location: [facility];  Service: Orthopedics;  Laterality: Left;  left 1st dorsal compartment release left wrist   . Minor amputation of digit Left 07/21/2013    Procedure: LEFT SMALL REVISION AMPUTATION (MINOR PROCEDURE) ;  Surgeon: Schuyler Amor, MD;  Location: [place];  Service: Orthopedics;  Laterality: Left;  . Colonoscopy with propofol N/A 04/07/2014    Procedure: COLONOSCOPY WITH PROPOFOL;  Surgeon: Jeryl Columbia, MD;  Location: [facility];  Service: Endoscopy;  Laterality: N/A;  . Total knee revision Left 01/25/2015    Procedure: LEFT TOTAL KNEE  ARTHOPLASTY REVISION;  Surgeon: Gaynelle Arabian, MD;  Location: [facility];  Service: Orthopedics;  Laterality: Left;  . Cardiac catheterization N/A 04/26/2015    Procedure: Left Heart Cath and Coronary Angiography;  Surgeon: Jettie Booze, MD;  Location: [place] [facility];  Service: Cardiovascular;  Laterality: N/A;  . Cardiac catheterization  04/26/2015    Procedure: Intravascular Ultrasound/IVUS;  Surgeon: Jettie Booze, MD;  Location: [place] [facility];  Service: Cardiovascular;;  . Carpal tunnel release Left 07/05/2015  Procedure: LEFT CARPAL TUNNEL RELEASE;  Surgeon: Charlotte Crumb, MD;  Location: [place];  Service: Orthopedics;  Laterality: Left;  . Ulnar tunnel release Left 07/05/2015    Procedure: LEFT CUBITAL TUNNEL RELEASE;  Surgeon: Charlotte Crumb, MD;  Location: [place];  Service: Orthopedics;  Laterality: Left;     reports that he quit smoking about 36 years ago. His smoking use included Cigarettes.  He quit after 15 years of use. He has never used smokeless tobacco. He reports that he does not drink alcohol or use illicit drugs.  Allergies  Allergen Reactions  . Bee Venom Anaphylaxis  . Shellfish Allergy Anaphylaxis  . Augmentin [Amoxicillin-Pot Clavulanate] Rash    Family History  Problem Relation Age of Onset  . Heart Problems Father   . Heart attack Father   . Hypertension Mother   . Stroke Mother     Prior to Admission medications   Medication Sig Start Date End Date Taking? Authorizing Provider  aspirin EC 81 MG tablet Take 81 mg by mouth daily.   Yes Historical Provider, MD  CALCIUM-VITAMIN D PO Take 1 tablet by mouth daily at 12 noon.   Yes Historical Provider, MD  dexlansoprazole (DEXILANT) 60 MG capsule Take 60 mg by mouth daily.   Yes Historical Provider, MD  escitalopram (LEXAPRO) 20 MG tablet Take 20 mg by mouth daily.   Yes Historical Provider, MD  gabapentin (NEURONTIN) 100 MG capsule Take 1 capsule by mouth 3 (three) times daily. 11/30/15  Yes Historical Provider, MD  HUMALOG 100 UNIT/ML injection  11/30/15  Yes Historical Provider, MD  insulin regular 1 Units/mL in sodium chloride 0.9 % 250 mL Inject into the vein continuous.   Yes Historical Provider, MD  lisinopril (PRINIVIL,ZESTRIL) 2.5 MG tablet TAKE 1 TABLET BY MOUTH TWICE DAILY 01/22/16  Yes Brittainy Erie Noe, PA-C  Multiple Vitamins-Minerals (ONE-A-DAY MENS 50+ ADVANTAGE PO) Take 1 tablet by mouth daily at 12 noon.   Yes Historical Provider, MD  polyethylene glycol powder (GLYCOLAX/MIRALAX) powder Take 17 g by mouth at bedtime.    Yes Historical Provider, MD  pregabalin (LYRICA) 100 MG capsule Take 1 capsule (100 mg total) by mouth 3 (three) times daily. 01/03/16  Yes Marcial Pacas, MD  primidone (MYSOLINE) 50 MG tablet TAKE 2 TABLETS BY MOUTH 3 TIMES DAILY. 01/04/16  Yes Marcial Pacas, MD  Probiotic Product (ALIGN) 4 MG CAPS Take 4 mg by mouth daily.   Yes Historical Provider, MD  propranolol ER (INDERAL LA) 60 MG 24  hr capsule Take 1 capsule (60 mg total) by mouth daily. 10/30/15  Yes Marcial Pacas, MD  rosuvastatin (CRESTOR) 20 MG tablet Take 1 tablet (20 mg total) by mouth daily. Patient taking differently: Take 10 mg by mouth daily.  01/02/16  Yes Fay Records, MD  carvedilol (COREG) 3.125 MG tablet Take 1 tablet (3.125 mg total) by mouth daily. 12/22/15 12/27/15  Fay Records, MD    Physical Exam: Filed Vitals:   02/13/16 [id number] 02/13/16 0830 02/13/16 0900 02/13/16 0934  BP: 108/60 114/52 102/58 114/55  Pulse: 77 76 125 71  Temp: 98.5 F (36.9 C)     TempSrc: Oral     Resp: 17 19 14 11   SpO2: 100% 100% 78% 100%      Constitutional: NAD, calm, comfortable Eyes: Significant bruising with edema and hematoma formation left eye with visible laceration to the outer canthus of the left eye; left eye swollen shut and unable to examine; right  eye appears unremarkable except for mild bruising in the inner canthus region ENMT: Mucous membranes are moist. Posterior pharynx clear of any exudate or lesions.Normal dentition. Patient has bruising over the bridge of the nose extending down towards the nose without any evidence of fracture Neck: normal, supple, no masses, no thyromegaly Respiratory: clear to auscultation bilaterally, no wheezing, no crackles. Normal respiratory effort. No accessory muscle use.  Cardiovascular: Regular rate and rhythm, no murmurs / rubs / gallops. No extremity edema. 2+ pedal pulses. No carotid bruits.  Abdomen: no tenderness, no masses palpated. No hepatosplenomegaly. Bowel sounds positive.  Musculoskeletal: no clubbing / cyanosis. No joint deformity upper and lower extremities. Good ROM, no contractures. Normal muscle tone.  Skin: no rashes, lesions, ulcers. No induration; subtle bruising of the left hip and thigh region; areas of sunburn with peeling primarily left medial forearm Neurologic: CN 2-12 grossly intact for known chronic right facial drooping with paresthesias secondary to  residual artifact from prior Bell's palsy. Sensation intact, DTR normal. Strength 5/5 x all 4 extremities.  Psychiatric: Very confused: Patient thought the season was summer-he was currently in [place] year was 1976-N Vivi Martens was president   Labs on Admission: I have personally reviewed following labs and imaging studies  CBC:  Recent Labs Lab 02/13/16 0322 02/13/16 0821  WBC 11.1* 8.0  NEUTROABS 9.4* 6.5  HGB 11.7* 10.4*  HCT 35.9* 31.3*  MCV 94.5 92.3  PLT 202 [id number]   Basic Metabolic Panel:  Recent Labs Lab 02/13/16 0322 02/13/16 0821  NA 136 139  K 5.8* 4.7  CL 99* 104  CO2 22 20*  GLUCOSE 605* 355*  BUN 30* 22*  CREATININE 1.20 1.10  CALCIUM 8.8* 8.4*   GFR: CrCl cannot be calculated (Unknown ideal weight.). Liver Function Tests:  Recent Labs Lab 02/13/16 0322  AST 53*  ALT 34  ALKPHOS 84  BILITOT 1.2  PROT 7.3  ALBUMIN 4.0   No results for input(s): LIPASE, AMYLASE in the last 168 hours. No results for input(s): AMMONIA in the last 168 hours. Coagulation Profile:  Recent Labs Lab 02/13/16 0821  INR 1.23   Cardiac Enzymes: No results for input(s): CKTOTAL, CKMB, CKMBINDEX, TROPONINI in the last 168 hours. BNP (last 3 results) No results for input(s): PROBNP in the last 8760 hours. HbA1C: No results for input(s): HGBA1C in the last 72 hours. CBG:  Recent Labs Lab 02/13/16 0322 02/13/16 0738 02/13/16 0816  GLUCAP >600* 364* 366*   Lipid Profile: No results for input(s): CHOL, HDL, LDLCALC, TRIG, CHOLHDL, LDLDIRECT in the last 72 hours. Thyroid Function Tests: No results for input(s): TSH, T4TOTAL, FREET4, T3FREE, THYROIDAB in the last 72 hours. Anemia Panel: No results for input(s): VITAMINB12, FOLATE, FERRITIN, TIBC, IRON, RETICCTPCT in the last 72 hours. Urine analysis:    Component Value Date/Time   COLORURINE AMBER* 04/19/2015 0210   APPEARANCEUR CLOUDY* 04/19/2015 0210   LABSPEC 1.030 04/19/2015 0210    PHURINE 6.0 04/19/2015 0210   GLUCOSEU 250* 04/19/2015 0210   HGBUR NEGATIVE 04/19/2015 0210   BILIRUBINUR NEGATIVE 04/19/2015 0210   KETONESUR 15* 04/19/2015 0210   PROTEINUR 30* 04/19/2015 0210   UROBILINOGEN 0.2 04/19/2015 0210   NITRITE NEGATIVE 04/19/2015 0210   LEUKOCYTESUR NEGATIVE 04/19/2015 0210   Sepsis Labs: @LABRCNTIP (procalcitonin:4,lacticidven:4) )No results found for this or any previous visit (from the past 240 hour(s)).   Radiological Exams on Admission: Dg Chest 1 View  02/13/2016  CLINICAL DATA:  Increasing leg pain after a fall yesterday.  EXAM: CHEST 1 VIEW COMPARISON:  04/21/2015 FINDINGS: Shallow inspiration. Normal heart size and pulmonary vascularity. No focal airspace disease or consolidation in the lungs. No blunting of costophrenic angles. No pneumothorax. Mediastinal contours appear intact. IMPRESSION: No active disease. Electronically Signed   By: Lucienne Capers M.D.   On: 02/13/2016 04:44   Ct Head Wo Contrast  02/13/2016  CLINICAL DATA:  Initial evaluation for acute trauma, fall. EXAM: CT HEAD WITHOUT CONTRAST CT MAXILLOFACIAL WITHOUT CONTRAST CT CERVICAL SPINE WITHOUT CONTRAST TECHNIQUE: Multidetector CT imaging of the head, cervical spine, and maxillofacial structures were performed using the standard protocol without intravenous contrast. Multiplanar CT image reconstructions of the cervical spine and maxillofacial structures were also generated. COMPARISON:  Prior study from 04/19/2015. FINDINGS: CT HEAD FINDINGS Large left periorbital and frontal scalp contusion. Globes intact. No retro-orbital hematoma or other pathology. No calvarial fracture.  Mastoid air cells are clear. Age-related cerebral atrophy with chronic microvascular ischemic disease present. No acute large vessel territory infarct. Acute subdural/ extra-axial hemorrhage overlies the right frontal convexity measuring up to 11 mm. Scattered subdural blood along the falx with a left parafalcine  subdural hematoma measuring up to 4 mm. There is associated scattered acute subarachnoid hemorrhage within the right frontal lobe and parasagittal left frontal parietal lobes. Subarachnoid blood along the superior aspect of the left sylvian fissure. 13 mm contusion at the left anterior temporal pole. Small amount of hemorrhage along the septum pellucidum measures up to 9 mm. Acute subarachnoid hemorrhage within the posterior right temporal lobe. No midline shift. No hydrocephalus. Small amount of layering intraventricular hemorrhage within the occipital horns bilaterally. Basilar cisterns remain patent. Superimposed chronic appearing bilateral subdural hygroma is measure up to 5-6 mm bilaterally. CT MAXILLOFACIAL FINDINGS Large left forehead/left periorbital and facial contusion. Mild left-sided enophthalmos. The left globe itself appears intact without retro-orbital hematoma or other pathology. Right globe intact without retro-orbital pathology. Bony orbits intact. No orbital floor fracture. Lamina papyracea intact. Orbital roofs intact. Zygomatic arches intact. No acute maxillary fracture. Pterygoid plates intact. No acute nasal bone fracture. Mandible intact. Mandibular condyles normally situated within the temporomandibular fossa. Paranasal and this is are largely clear. CT CERVICAL SPINE FINDINGS Trace anterolisthesis of C4 on C5, likely chronic. The vertebral bodies are otherwise normally aligned with preservation of the normal cervical lordosis. Vertebral body heights are preserved. Normal C1-2 articulations are intact. No prevertebral soft tissue swelling. No acute fracture or listhesis. Moderate degenerative spondylolysis at C5-6 and C6-7. Scatter multilevel facet arthrosis noted. Visualized soft tissues of the neck demonstrate no acute abnormality. Scattered vascular calcifications about the carotid bifurcations. Visualized lung apices are clear without evidence of apical pneumothorax. IMPRESSION: CT HEAD:  1. Acute traumatic subarachnoid hemorrhage involving both cerebral hemispheres as above, with additional 13 mm parenchymal contusion at the left temporal pole. 2. Small volume acute subdural hemorrhage overlying the right frontal convexity and falx. 3. Small volume acute intraventricular hemorrhage within the occipital horns of both lateral ventricles. This may be related to redistribution. No hydrocephalus or midline shift. 4. Large left frontal/periorbital scalp contusion. No calvarial fracture. CT MAXILLOFACIAL: 1. No acute maxillofacial fracture. 2. Large left forehead/periorbital and facial contusion. There is associated left enophthalmos. Left globe itself appears grossly intact without retro-orbital hematoma or other pathology. CT CERVICAL SPINE: No acute traumatic injury within the cervical spine. Critical Value/emergent results were called by telephone at the time of interpretation on 02/13/2016 at 5:21 am to Dr. Veryl Speak , who verbally acknowledged these results. Electronically Signed  By: Jeannine Boga M.D.   On: 02/13/2016 05:23   Ct Cervical Spine Wo Contrast  02/13/2016  CLINICAL DATA:  Initial evaluation for acute trauma, fall. EXAM: CT HEAD WITHOUT CONTRAST CT MAXILLOFACIAL WITHOUT CONTRAST CT CERVICAL SPINE WITHOUT CONTRAST TECHNIQUE: Multidetector CT imaging of the head, cervical spine, and maxillofacial structures were performed using the standard protocol without intravenous contrast. Multiplanar CT image reconstructions of the cervical spine and maxillofacial structures were also generated. COMPARISON:  Prior study from 04/19/2015. FINDINGS: CT HEAD FINDINGS Large left periorbital and frontal scalp contusion. Globes intact. No retro-orbital hematoma or other pathology. No calvarial fracture.  Mastoid air cells are clear. Age-related cerebral atrophy with chronic microvascular ischemic disease present. No acute large vessel territory infarct. Acute subdural/ extra-axial hemorrhage  overlies the right frontal convexity measuring up to 11 mm. Scattered subdural blood along the falx with a left parafalcine subdural hematoma measuring up to 4 mm. There is associated scattered acute subarachnoid hemorrhage within the right frontal lobe and parasagittal left frontal parietal lobes. Subarachnoid blood along the superior aspect of the left sylvian fissure. 13 mm contusion at the left anterior temporal pole. Small amount of hemorrhage along the septum pellucidum measures up to 9 mm. Acute subarachnoid hemorrhage within the posterior right temporal lobe. No midline shift. No hydrocephalus. Small amount of layering intraventricular hemorrhage within the occipital horns bilaterally. Basilar cisterns remain patent. Superimposed chronic appearing bilateral subdural hygroma is measure up to 5-6 mm bilaterally. CT MAXILLOFACIAL FINDINGS Large left forehead/left periorbital and facial contusion. Mild left-sided enophthalmos. The left globe itself appears intact without retro-orbital hematoma or other pathology. Right globe intact without retro-orbital pathology. Bony orbits intact. No orbital floor fracture. Lamina papyracea intact. Orbital roofs intact. Zygomatic arches intact. No acute maxillary fracture. Pterygoid plates intact. No acute nasal bone fracture. Mandible intact. Mandibular condyles normally situated within the temporomandibular fossa. Paranasal and this is are largely clear. CT CERVICAL SPINE FINDINGS Trace anterolisthesis of C4 on C5, likely chronic. The vertebral bodies are otherwise normally aligned with preservation of the normal cervical lordosis. Vertebral body heights are preserved. Normal C1-2 articulations are intact. No prevertebral soft tissue swelling. No acute fracture or listhesis. Moderate degenerative spondylolysis at C5-6 and C6-7. Scatter multilevel facet arthrosis noted. Visualized soft tissues of the neck demonstrate no acute abnormality. Scattered vascular calcifications  about the carotid bifurcations. Visualized lung apices are clear without evidence of apical pneumothorax. IMPRESSION: CT HEAD: 1. Acute traumatic subarachnoid hemorrhage involving both cerebral hemispheres as above, with additional 13 mm parenchymal contusion at the left temporal pole. 2. Small volume acute subdural hemorrhage overlying the right frontal convexity and falx. 3. Small volume acute intraventricular hemorrhage within the occipital horns of both lateral ventricles. This may be related to redistribution. No hydrocephalus or midline shift. 4. Large left frontal/periorbital scalp contusion. No calvarial fracture. CT MAXILLOFACIAL: 1. No acute maxillofacial fracture. 2. Large left forehead/periorbital and facial contusion. There is associated left enophthalmos. Left globe itself appears grossly intact without retro-orbital hematoma or other pathology. CT CERVICAL SPINE: No acute traumatic injury within the cervical spine. Critical Value/emergent results were called by telephone at the time of interpretation on 02/13/2016 at 5:21 am to Dr. Veryl Speak , who verbally acknowledged these results. Electronically Signed   By: Jeannine Boga M.D.   On: 02/13/2016 05:23   Dg Hip Unilat With Pelvis 2-3 Views Left  02/13/2016  CLINICAL DATA:  Increasing pain after a fall. EXAM: DG HIP (WITH OR WITHOUT PELVIS) 2-3V LEFT COMPARISON:  None. FINDINGS: Pelvis and left hip appear intact. No evidence of acute fracture or dislocation. No focal bone lesion or bone destruction. Vascular calcifications. Surgical clips over the symphysis pubis. IMPRESSION: No acute bony abnormalities. Electronically Signed   By: Lucienne Capers M.D.   On: 02/13/2016 04:45   Dg Femur Min 2 Views Left  02/13/2016  CLINICAL DATA:  Increasing pain and difficulty weight-bearing after a fall late afternoon of 02/12/2016. EXAM: LEFT FEMUR 2 VIEWS COMPARISON:  None. FINDINGS: Left total knee arthroplasty. No evidence of acute fracture or  dislocation. No focal bone lesion or bone destruction. Soft tissues are unremarkable. IMPRESSION: No acute bony abnormalities. Electronically Signed   By: Lucienne Capers M.D.   On: 02/13/2016 04:44   Ct Maxillofacial Wo Cm  02/13/2016  CLINICAL DATA:  Initial evaluation for acute trauma, fall. EXAM: CT HEAD WITHOUT CONTRAST CT MAXILLOFACIAL WITHOUT CONTRAST CT CERVICAL SPINE WITHOUT CONTRAST TECHNIQUE: Multidetector CT imaging of the head, cervical spine, and maxillofacial structures were performed using the standard protocol without intravenous contrast. Multiplanar CT image reconstructions of the cervical spine and maxillofacial structures were also generated. COMPARISON:  Prior study from 04/19/2015. FINDINGS: CT HEAD FINDINGS Large left periorbital and frontal scalp contusion. Globes intact. No retro-orbital hematoma or other pathology. No calvarial fracture.  Mastoid air cells are clear. Age-related cerebral atrophy with chronic microvascular ischemic disease present. No acute large vessel territory infarct. Acute subdural/ extra-axial hemorrhage overlies the right frontal convexity measuring up to 11 mm. Scattered subdural blood along the falx with a left parafalcine subdural hematoma measuring up to 4 mm. There is associated scattered acute subarachnoid hemorrhage within the right frontal lobe and parasagittal left frontal parietal lobes. Subarachnoid blood along the superior aspect of the left sylvian fissure. 13 mm contusion at the left anterior temporal pole. Small amount of hemorrhage along the septum pellucidum measures up to 9 mm. Acute subarachnoid hemorrhage within the posterior right temporal lobe. No midline shift. No hydrocephalus. Small amount of layering intraventricular hemorrhage within the occipital horns bilaterally. Basilar cisterns remain patent. Superimposed chronic appearing bilateral subdural hygroma is measure up to 5-6 mm bilaterally. CT MAXILLOFACIAL FINDINGS Large left  forehead/left periorbital and facial contusion. Mild left-sided enophthalmos. The left globe itself appears intact without retro-orbital hematoma or other pathology. Right globe intact without retro-orbital pathology. Bony orbits intact. No orbital floor fracture. Lamina papyracea intact. Orbital roofs intact. Zygomatic arches intact. No acute maxillary fracture. Pterygoid plates intact. No acute nasal bone fracture. Mandible intact. Mandibular condyles normally situated within the temporomandibular fossa. Paranasal and this is are largely clear. CT CERVICAL SPINE FINDINGS Trace anterolisthesis of C4 on C5, likely chronic. The vertebral bodies are otherwise normally aligned with preservation of the normal cervical lordosis. Vertebral body heights are preserved. Normal C1-2 articulations are intact. No prevertebral soft tissue swelling. No acute fracture or listhesis. Moderate degenerative spondylolysis at C5-6 and C6-7. Scatter multilevel facet arthrosis noted. Visualized soft tissues of the neck demonstrate no acute abnormality. Scattered vascular calcifications about the carotid bifurcations. Visualized lung apices are clear without evidence of apical pneumothorax. IMPRESSION: CT HEAD: 1. Acute traumatic subarachnoid hemorrhage involving both cerebral hemispheres as above, with additional 13 mm parenchymal contusion at the left temporal pole. 2. Small volume acute subdural hemorrhage overlying the right frontal convexity and falx. 3. Small volume acute intraventricular hemorrhage within the occipital horns of both lateral ventricles. This may be related to redistribution. No hydrocephalus or midline shift. 4. Large left frontal/periorbital scalp contusion. No calvarial fracture.  CT MAXILLOFACIAL: 1. No acute maxillofacial fracture. 2. Large left forehead/periorbital and facial contusion. There is associated left enophthalmos. Left globe itself appears grossly intact without retro-orbital hematoma or other  pathology. CT CERVICAL SPINE: No acute traumatic injury within the cervical spine. Critical Value/emergent results were called by telephone at the time of interpretation on 02/13/2016 at 5:21 am to Dr. Veryl Speak , who verbally acknowledged these results. Electronically Signed   By: Jeannine Boga M.D.   On: 02/13/2016 05:23    EKG: (Independently reviewed) sinus rhythm with ventricular rate 75 bpm, QTC 485 ms, no ST segment or T-wave changes that would be concerning for ischemia  Assessment/Plan Principal Problem:   Diabetes mellitus, insulin dependent (IDDM), uncontrolled ([place]) -Had significantly elevated CBG at presentation without elevated anion gap; on insulin pump at home and potentially needle could have dislodged/became malposition with fall -Because of altered mentation we will discontinue insulin pump for now and utilize low dose long-acting insulin postop; will need to begin Lantus 5 units subcutaneous post op -Check CBGs every 4 hours and begin SSRI -Check hemoglobin A1c -Diabetes educator consultation to assist in resumption of insulin pump once patient appropriate to manage pump from a mentation standpoint  Active Problems:   Fall at home/unwitnessed -For completeness of exam will complete echocardiogram -Currently no arrhythmia in ER but will continue telemetry -PT/OT at discretion of trauma service and neurosurgery    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 Katy Fitch -Plan is to take to OR for more complete evaluation but based on imaging and limited bedside exam no concerns at this juncture for globe disruption (open globe)    History of Takotsubo  Cardiomyopathy- ECHO 05/2015 w/EF 55% -Currently asymptomatic and x-ray without heart failure -Most recent echo documented in outpatient cardiology notes with normalized ejection fraction -Likely can resume ACE inhibitor tomorrow -Wife states no longer takes carvedilol     Nonobstructive CAD -Per cardiac catheterization July 2016 -When okay with trauma and neurosurgery will need to resume aspirin and Crestor -Currently asymptomatic and both patient and wife do not recall any chest pain shortness of breath or dyspnea on exertion in the days preceding the fall    Normocytic anemia -Check anemia panel -[place] appears stable and around baseline so likely of chronic disease etiology is possible iron deficiency    Essential tremor/Facial paresthesia/chronic on left after Bells Palsy/cervical degeneration with bilateral hand paresthesias -Once allowed oral medications resume primidone and Inderal for tremor -Continue Neurontin for hand paresthesia      DVT prophylaxis: SCDs Code Status: Full code Family Communication: Patient's wife Jonnatan Appleby at bedside Disposition Plan: At discretion of admitting team Consults called: Pool/ Neurosurgery; [place] / Opthalmology; [place] / Hospitalists Admission status: Stepdown/inpatient  Mobility: Prior to fall patient and wife reports without any assistive devices   ELLIS,ALLISON L. ANP-BC [facility] Pager [phone]   If 7PM-7AM, please contact night-coverage [email or web address] Password [facility]  02/13/2016, 10:23 AM

## 2016-02-13 NOTE — Progress Notes (Signed)
Inpatient Diabetes Program Recommendations  AACE/ADA: New Consensus Statement on Inpatient Glycemic Control (2015)  Target Ranges:  Prepandial:   less than 140 mg/dL      Peak postprandial:   less than 180 mg/dL (1-2 hours)      Critically ill patients:  140 - 180 mg/dL   Review of Glycemic Control  Diabetes history: DM1 Outpatient Diabetes medications: Insulin pump - Humalog (per EMR 1 unit/hour) Current orders for Inpatient glycemic control: Novolog sensitive Q4H (given Novolog 10 units in ED)  Results for BRETON, BERNS (MRN [id number]) as of 02/13/2016 10:22  Ref. Range 02/13/2016 03:22 02/13/2016 07:38 02/13/2016 08:16  Glucose-Capillary Latest Ref Range: 65-99 mg/dL >600 (HH) 364 (H) 366 (H)  Results for TARANCE, BALAN (MRN [id number]) as of 02/13/2016 10:22  Ref. Range 04/22/2015 11:50  Hemoglobin A1C Latest Ref Range: 4.8-5.6 % 7.3 (H)  Results for SEMAJ, COBURN (MRN [id number]) as of 02/13/2016 10:22  Ref. Range 02/13/2016 08:21  Sodium Latest Ref Range: 135-145 mmol/L 139  Potassium Latest Ref Range: 3.5-5.1 mmol/L 4.7  Chloride Latest Ref Range: 101-111 mmol/L 104  CO2 Latest Ref Range: 22-32 mmol/L 20 (L)  BUN Latest Ref Range: 6-20 mg/dL 22 (H)  Creatinine Latest Ref Range: 0.61-1.24 mg/dL 1.10  Calcium Latest Ref Range: 8.9-10.3 mg/dL 8.4 (L)  EGFR (Non-African Amer.) Latest Ref Range: >60 mL/min >60  EGFR (African American) Latest Ref Range: >60 mL/min >60  Glucose Latest Ref Range: 65-99 mg/dL 355 (H)  Anion gap Latest Ref Range: 5-15  15   ? Insulin pump malfunctioning with Glucose of 605 mg/dL. Urine ketones present. AG - 15   CO2 - dropped to 20  Inpatient Diabetes Program Recommendations:    IV insulin/GlucoStabilizer for glycemic control. Pt is Type 1 and will need basal insulin if not on insulin drip.  Lantus 24 units Q24H.  Agree with Novolog sensitive Q4H.  Will follow closely. Thank you. Lorenda Peck, RD, LDN, CDE Inpatient Diabetes  Coordinator [phone]

## 2016-02-13 NOTE — ED Notes (Signed)
Dr. Delo at bedside. 

## 2016-02-13 NOTE — ED Notes (Signed)
Dr Pool at bedside 

## 2016-02-13 NOTE — ED Provider Notes (Signed)
  Patient transferred from [facility] after unwitnessed fall versus syncope. Has subdural, subarachnoid, eye laceration. He is not anticoagulated. He was found to be hyperglycemic with a history of diabetes on insulin pump.  Patient is awake and alert, he is oriented only to person. Vitals are stable. He is very confused but moving all extremities. Difficulty visualizing L eye due to overlying swelling. Vision intact to light perception.  Discussed with Dr. Hulen Skains and trauma surgery who recommends neurosurgery evaluation and medical admission given his hyperglycemia. Discussed with Dr. Annette Stable neurosurgery. States his subdural is nonoperative and recommends repeat CT in the morning.   Eye injury d/w Dr. Katy Fitch of ophthalmology. He is concerned for open globe and would like to take the patient to the OR later today.   Dr. Katy Fitch has done a more thorough exam and now does not feel that patient has an open globe. Agrees with antibiotics. Discussed with Dr. Hulen Skains of the trauma service as well as NP Lissa Merlin of the medical service. Plan admission for monitoring. Trauma service will be primary and medical team and neurosurgery and ophthalmology will consult.   CRITICAL CARE Performed by: Ezequiel Essex Total critical care time: 35 minutes Critical care time was exclusive of separately billable procedures and treating other patients. Critical care was necessary to treat or prevent imminent or life-threatening deterioration. Critical care was time spent personally by me on the following activities: development of treatment plan with patient and/or surrogate as well as nursing, discussions with consultants, evaluation of patient's response to treatment, examination of patient, obtaining history from patient or surrogate, ordering and performing treatments and interventions, ordering and review of laboratory studies, ordering and review of radiographic studies, pulse oximetry and re-evaluation of patient's  condition.   Ezequiel Essex, MD 02/13/16 [id number]

## 2016-02-13 NOTE — ED Notes (Signed)
Opthalmology at bedside.

## 2016-02-13 NOTE — H&P (Signed)
Expand All Collapse All   Reason for Consult:Fall Referring Physician: Cordarius Morrow is an 75 y.o. male.  HPI: Dale Morrow was home alone last night. He was in bed when his wife came home. She noticed some blood on the stair landing and then saw his bloody and swollen face. The pt is amnestic to the events of the night. His wife woke him up and he went to the bathroom but was unsteady. He then got weak and slid to the floor and she called EMS. Of note his CBG is over 600.  Past Medical History  Diagnosis Date  . Arthritis     left knee  . Depression   . De Quervain's tenosynovitis, left 04/2012  . Tremors of nervous system     takes propranolol for tremors  . Prostate cancer, primary, with metastasis from prostate to other site [facility])     a. metastasis to lymph nodes; b. s/p XRT.  [name] Complication of anesthesia     pt denies ponv, pt woke up during colonscopy in past  . Diabetes mellitus     Insulin pump, takes lisinopril for kidneys, dr Buddy Duty endocrinologist  . Left-sided Bell's palsy   . Right-sided Bell's palsy   . Nonischemic cardiomyopathy ([place])     a. 04/2015 Echo: EF 25-30%, mid apical, antseptal, anterolat, inflat, infsept, apical AK. ? takotsubo. No thrombus. PASP 60mHg; b. 04/2014 Lexi MV: EF 29%, fixed mod size and intensity mid-dist ant, apical, inferior scar w/ wma's; c. 04/2015 Cath: LM nl, LAD 50p/m, LCX 25p, RI nl, RCA nl, RPAV 50d-->Med Rx.  . Chronic systolic CH[place] ([place]estive [place]     a. 04/2015 Echo: EF 25-30%.  .Marland KitchenGERD (gastroesophageal reflux disease)     Past Surgical History  Procedure Laterality Date  . Hemorrhoid surgery  1970s  . Anterior cruciate ligament repair      right knee  . Wrist arthroscopy  05/03/2010    right; and release of 1st dorsal compartment  . Knee arthrotomy  03/18/2008    left; with scar exc.  .Marland KitchenKnee  arthroscopy  09/25/2007; 04/15/2005    left  . Knee joint manipulation  06/29/2007    left  . Total knee arthroplasty  04/29/2007    left  . Dorsal compartment release  04/21/2012    Procedure: RELEASE DORSAL COMPARTMENT (DEQUERVAIN); Surgeon: RCammie Sickle, MD; [facility]: [facility]; Laterality: Left; left 1st dorsal compartment release left wrist   . Minor amputation of digit Left 07/21/2013    Procedure: LEFT SMALL REVISION AMPUTATION (MINOR PROCEDURE) ; Surgeon: MSchuyler Amor MD; [place] [facility] [place]: [place]; Laterality: Left;  . Colonoscopy with propofol N/A 04/07/2014    Procedure: COLONOSCOPY WITH PROPOFOL; Surgeon: MJeryl Columbia MD; Location: [facility]; Service: Endoscopy; Laterality: N/A;  . Total knee revision Left 01/25/2015    Procedure: LEFT TOTAL KNEE ARTHOPLASTY REVISION; Surgeon: FGaynelle Arabian MD; Location: [facility]; Service: Orthopedics; Laterality: Left;  . Cardiac catheterization N/A 04/26/2015    Procedure: Left Heart Cath and Coronary Angiography; Surgeon: JJettie Booze MD; [facility]; Service: Cardiovascular; Laterality: N/A;  . Cardiac catheterization  04/26/2015    Procedure: Intravascular Ultrasound/IVUS; Surgeon: JJettie Booze MD; [place] [facility]; Service: Cardiovascular;;  . Carpal tunnel release Left 07/05/2015    Procedure: LEFT CARPAL TUNNEL RELEASE; Surgeon: MCharlotte Crumb MD; [place] [facility]: [place]; Laterality: Left;  .  Ulnar tunnel release Left 07/05/2015    Procedure: LEFT CUBITAL TUNNEL RELEASE; Surgeon: Charlotte Crumb, MD; Location: [place]; Service: Orthopedics; Laterality: Left;    Family History  Problem Relation Age of Onset  . Heart Problems Father   . Heart  attack Father   . Hypertension Mother   . Stroke Mother     Social History:  reports that he quit smoking about 36 years ago. His smoking use included Cigarettes. He quit after 15 years of use. He has never used smokeless tobacco. He reports that he does not drink alcohol or use illicit drugs.  Allergies:  Allergies  Allergen Reactions  . Bee Venom Anaphylaxis  . Shellfish Allergy Anaphylaxis  . Augmentin [Amoxicillin-Pot Clavulanate] Rash    Medications: I have reviewed the patient's current medications.   Lab Results Last 48 Hours    Results for orders placed or performed during the hospital encounter of 02/13/16 (from the past 48 hour(s))  CBC with Differential Status: Abnormal   Collection Time: 02/13/16 3:22 AM  Result Value Ref Range   WBC 11.1 (H) 4.0 - 10.5 K/uL   RBC 3.80 (L) 4.22 - 5.81 MIL/uL   Hemoglobin 11.7 (L) 13.0 - 17.0 g/dL   HCT 35.9 (L) 39.0 - 52.0 %   MCV 94.5 78.0 - 100.0 fL   MCH 30.8 26.0 - 34.0 pg   MCHC 32.6 30.0 - 36.0 g/dL   RDW 13.8 11.5 - 15.5 %   Platelets 202 150 - 400 K/uL   Neutrophils Relative % 84 %   Neutro Abs 9.4 (H) 1.7 - 7.7 K/uL   Lymphocytes Relative 9 %   Lymphs Abs 1.0 0.7 - 4.0 K/uL   Monocytes Relative 6 %   Monocytes Absolute 0.6 0.1 - 1.0 K/uL   Eosinophils Relative 1 %   Eosinophils Absolute 0.1 0.0 - 0.7 K/uL   Basophils Relative 0 %   Basophils Absolute 0.0 0.0 - 0.1 K/uL  Comprehensive metabolic panel Status: Abnormal   Collection Time: 02/13/16 3:22 AM  Result Value Ref Range   Sodium 136 135 - 145 mmol/L   Potassium 5.8 (H) 3.5 - 5.1 mmol/L   Chloride 99 (L) 101 - 111 mmol/L   CO2 22 22 - 32 mmol/L   Glucose, Bld 605 (HH) 65 - 99 mg/dL    Comment: CRITICAL RESULT CALLED TO, READ BACK BY AND VERIFIED WITH: Candiss Norse RN [id number] 02/13/16 A NAVARRO    BUN 30 (H) 6 - 20  mg/dL   Creatinine, Ser 1.20 0.61 - 1.24 mg/dL   Calcium 8.8 (L) 8.9 - 10.3 mg/dL   Total Protein 7.3 6.5 - 8.1 g/dL   Albumin 4.0 3.5 - 5.0 g/dL   AST 53 (H) 15 - 41 U/L   ALT 34 17 - 63 U/L   Alkaline Phosphatase 84 38 - 126 U/L   Total Bilirubin 1.2 0.3 - 1.2 mg/dL   GFR calc non Af Amer 58 (L) >60 mL/min   GFR calc Af Amer >60 >60 mL/min    Comment: (NOTE) The eGFR has been calculated using the CKD EPI equation. This calculation has not been validated in all clinical situations. eGFR's persistently <60 mL/min signify possible Chronic Kidney Disease.    Anion gap 15 5 - 15  CBG monitoring, ED Status: Abnormal   Collection Time: 02/13/16 3:22 AM  Result Value Ref Range   Glucose-Capillary >600 (HH) 65 - 99 mg/dL  CBG monitoring, ED Status: Abnormal   Collection  Time: 02/13/16 7:38 AM  Result Value Ref Range   Glucose-Capillary 364 (H) 65 - 99 mg/dL  CBG monitoring, ED Status: Abnormal   Collection Time: 02/13/16 8:16 AM  Result Value Ref Range   Glucose-Capillary 366 (H) 65 - 99 mg/dL       Imaging Results (Last 48 hours)    Dg Chest 1 View  02/13/2016 CLINICAL DATA: Increasing leg pain after a fall yesterday. EXAM: CHEST 1 VIEW COMPARISON: 04/21/2015 FINDINGS: Shallow inspiration. Normal heart size and pulmonary vascularity. No focal airspace disease or consolidation in the lungs. No blunting of costophrenic angles. No pneumothorax. Mediastinal contours appear intact. IMPRESSION: No active disease. Electronically Signed By: Lucienne Capers M.D. On: 02/13/2016 04:44   Ct Head Wo Contrast  02/13/2016 CLINICAL DATA: Initial evaluation for acute trauma, fall. EXAM: CT HEAD WITHOUT CONTRAST CT MAXILLOFACIAL WITHOUT CONTRAST CT CERVICAL SPINE WITHOUT CONTRAST TECHNIQUE: Multidetector CT imaging of the head, cervical spine, and maxillofacial structures were performed using the  standard protocol without intravenous contrast. Multiplanar CT image reconstructions of the cervical spine and maxillofacial structures were also generated. COMPARISON: Prior study from 04/19/2015. FINDINGS: CT HEAD FINDINGS Large left periorbital and frontal scalp contusion. Globes intact. No retro-orbital hematoma or other pathology. No calvarial fracture. Mastoid air cells are clear. Age-related cerebral atrophy with chronic microvascular ischemic disease present. No acute large vessel territory infarct. Acute subdural/ extra-axial hemorrhage overlies the right frontal convexity measuring up to 11 mm. Scattered subdural blood along the falx with a left parafalcine subdural hematoma measuring up to 4 mm. There is associated scattered acute subarachnoid hemorrhage within the right frontal lobe and parasagittal left frontal parietal lobes. Subarachnoid blood along the superior aspect of the left sylvian fissure. 13 mm contusion at the left anterior temporal pole. Small amount of hemorrhage along the septum pellucidum measures up to 9 mm. Acute subarachnoid hemorrhage within the posterior right temporal lobe. No midline shift. No hydrocephalus. Small amount of layering intraventricular hemorrhage within the occipital horns bilaterally. Basilar cisterns remain patent. Superimposed chronic appearing bilateral subdural hygroma is measure up to 5-6 mm bilaterally. CT MAXILLOFACIAL FINDINGS Large left forehead/left periorbital and facial contusion. Mild left-sided enophthalmos. The left globe itself appears intact without retro-orbital hematoma or other pathology. Right globe intact without retro-orbital pathology. Bony orbits intact. No orbital floor fracture. Lamina papyracea intact. Orbital roofs intact. Zygomatic arches intact. No acute maxillary fracture. Pterygoid plates intact. No acute nasal bone fracture. Mandible intact. Mandibular condyles normally situated within the temporomandibular fossa. Paranasal and  this is are largely clear. CT CERVICAL SPINE FINDINGS Trace anterolisthesis of C4 on C5, likely chronic. The vertebral bodies are otherwise normally aligned with preservation of the normal cervical lordosis. Vertebral body heights are preserved. Normal C1-2 articulations are intact. No prevertebral soft tissue swelling. No acute fracture or listhesis. Moderate degenerative spondylolysis at C5-6 and C6-7. Scatter multilevel facet arthrosis noted. Visualized soft tissues of the neck demonstrate no acute abnormality. Scattered vascular calcifications about the carotid bifurcations. Visualized lung apices are clear without evidence of apical pneumothorax. IMPRESSION: CT HEAD: 1. Acute traumatic subarachnoid hemorrhage involving both cerebral hemispheres as above, with additional 13 mm parenchymal contusion at the left temporal pole. 2. Small volume acute subdural hemorrhage overlying the right frontal convexity and falx. 3. Small volume acute intraventricular hemorrhage within the occipital horns of both lateral ventricles. This may be related to redistribution. No hydrocephalus or midline shift. 4. Large left frontal/periorbital scalp contusion. No calvarial fracture. CT MAXILLOFACIAL: 1. No acute maxillofacial fracture.  2. Large left forehead/periorbital and facial contusion. There is associated left enophthalmos. Left globe itself appears grossly intact without retro-orbital hematoma or other pathology. CT CERVICAL SPINE: No acute traumatic injury within the cervical spine. Critical Value/emergent results were called by telephone at the time of interpretation on 02/13/2016 at 5:21 am to Dr. Veryl Speak , who verbally acknowledged these results. Electronically Signed By: Jeannine Boga M.D. On: 02/13/2016 05:23   Ct Cervical Spine Wo Contrast  02/13/2016 CLINICAL DATA: Initial evaluation for acute trauma, fall. EXAM: CT HEAD WITHOUT CONTRAST CT MAXILLOFACIAL WITHOUT CONTRAST CT CERVICAL SPINE WITHOUT  CONTRAST TECHNIQUE: Multidetector CT imaging of the head, cervical spine, and maxillofacial structures were performed using the standard protocol without intravenous contrast. Multiplanar CT image reconstructions of the cervical spine and maxillofacial structures were also generated. COMPARISON: Prior study from 04/19/2015. FINDINGS: CT HEAD FINDINGS Large left periorbital and frontal scalp contusion. Globes intact. No retro-orbital hematoma or other pathology. No calvarial fracture. Mastoid air cells are clear. Age-related cerebral atrophy with chronic microvascular ischemic disease present. No acute large vessel territory infarct. Acute subdural/ extra-axial hemorrhage overlies the right frontal convexity measuring up to 11 mm. Scattered subdural blood along the falx with a left parafalcine subdural hematoma measuring up to 4 mm. There is associated scattered acute subarachnoid hemorrhage within the right frontal lobe and parasagittal left frontal parietal lobes. Subarachnoid blood along the superior aspect of the left sylvian fissure. 13 mm contusion at the left anterior temporal pole. Small amount of hemorrhage along the septum pellucidum measures up to 9 mm. Acute subarachnoid hemorrhage within the posterior right temporal lobe. No midline shift. No hydrocephalus. Small amount of layering intraventricular hemorrhage within the occipital horns bilaterally. Basilar cisterns remain patent. Superimposed chronic appearing bilateral subdural hygroma is measure up to 5-6 mm bilaterally. CT MAXILLOFACIAL FINDINGS Large left forehead/left periorbital and facial contusion. Mild left-sided enophthalmos. The left globe itself appears intact without retro-orbital hematoma or other pathology. Right globe intact without retro-orbital pathology. Bony orbits intact. No orbital floor fracture. Lamina papyracea intact. Orbital roofs intact. Zygomatic arches intact. No acute maxillary fracture. Pterygoid plates intact. No acute  nasal bone fracture. Mandible intact. Mandibular condyles normally situated within the temporomandibular fossa. Paranasal and this is are largely clear. CT CERVICAL SPINE FINDINGS Trace anterolisthesis of C4 on C5, likely chronic. The vertebral bodies are otherwise normally aligned with preservation of the normal cervical lordosis. Vertebral body heights are preserved. Normal C1-2 articulations are intact. No prevertebral soft tissue swelling. No acute fracture or listhesis. Moderate degenerative spondylolysis at C5-6 and C6-7. Scatter multilevel facet arthrosis noted. Visualized soft tissues of the neck demonstrate no acute abnormality. Scattered vascular calcifications about the carotid bifurcations. Visualized lung apices are clear without evidence of apical pneumothorax. IMPRESSION: CT HEAD: 1. Acute traumatic subarachnoid hemorrhage involving both cerebral hemispheres as above, with additional 13 mm parenchymal contusion at the left temporal pole. 2. Small volume acute subdural hemorrhage overlying the right frontal convexity and falx. 3. Small volume acute intraventricular hemorrhage within the occipital horns of both lateral ventricles. This may be related to redistribution. No hydrocephalus or midline shift. 4. Large left frontal/periorbital scalp contusion. No calvarial fracture. CT MAXILLOFACIAL: 1. No acute maxillofacial fracture. 2. Large left forehead/periorbital and facial contusion. There is associated left enophthalmos. Left globe itself appears grossly intact without retro-orbital hematoma or other pathology. CT CERVICAL SPINE: No acute traumatic injury within the cervical spine. Critical Value/emergent results were called by telephone at the time of interpretation on 02/13/2016 at  5:21 am to Dr. Veryl Speak , who verbally acknowledged these results. Electronically Signed By: Jeannine Boga M.D. On: 02/13/2016 05:23   Dg Hip Unilat With Pelvis 2-3 Views Left  02/13/2016 CLINICAL DATA:  Increasing pain after a fall. EXAM: DG HIP (WITH OR WITHOUT PELVIS) 2-3V LEFT COMPARISON: None. FINDINGS: Pelvis and left hip appear intact. No evidence of acute fracture or dislocation. No focal bone lesion or bone destruction. Vascular calcifications. Surgical clips over the symphysis pubis. IMPRESSION: No acute bony abnormalities. Electronically Signed By: Lucienne Capers M.D. On: 02/13/2016 04:45   Dg Femur Min 2 Views Left  02/13/2016 CLINICAL DATA: Increasing pain and difficulty weight-bearing after a fall late afternoon of 02/12/2016. EXAM: LEFT FEMUR 2 VIEWS COMPARISON: None. FINDINGS: Left total knee arthroplasty. No evidence of acute fracture or dislocation. No focal bone lesion or bone destruction. Soft tissues are unremarkable. IMPRESSION: No acute bony abnormalities. Electronically Signed By: Lucienne Capers M.D. On: 02/13/2016 04:44   Ct Maxillofacial Wo Cm  02/13/2016 CLINICAL DATA: Initial evaluation for acute trauma, fall. EXAM: CT HEAD WITHOUT CONTRAST CT MAXILLOFACIAL WITHOUT CONTRAST CT CERVICAL SPINE WITHOUT CONTRAST TECHNIQUE: Multidetector CT imaging of the head, cervical spine, and maxillofacial structures were performed using the standard protocol without intravenous contrast. Multiplanar CT image reconstructions of the cervical spine and maxillofacial structures were also generated. COMPARISON: Prior study from 04/19/2015. FINDINGS: CT HEAD FINDINGS Large left periorbital and frontal scalp contusion. Globes intact. No retro-orbital hematoma or other pathology. No calvarial fracture. Mastoid air cells are clear. Age-related cerebral atrophy with chronic microvascular ischemic disease present. No acute large vessel territory infarct. Acute subdural/ extra-axial hemorrhage overlies the right frontal convexity measuring up to 11 mm. Scattered subdural blood along the falx with a left parafalcine subdural hematoma measuring up to 4 mm. There is associated scattered acute  subarachnoid hemorrhage within the right frontal lobe and parasagittal left frontal parietal lobes. Subarachnoid blood along the superior aspect of the left sylvian fissure. 13 mm contusion at the left anterior temporal pole. Small amount of hemorrhage along the septum pellucidum measures up to 9 mm. Acute subarachnoid hemorrhage within the posterior right temporal lobe. No midline shift. No hydrocephalus. Small amount of layering intraventricular hemorrhage within the occipital horns bilaterally. Basilar cisterns remain patent. Superimposed chronic appearing bilateral subdural hygroma is measure up to 5-6 mm bilaterally. CT MAXILLOFACIAL FINDINGS Large left forehead/left periorbital and facial contusion. Mild left-sided enophthalmos. The left globe itself appears intact without retro-orbital hematoma or other pathology. Right globe intact without retro-orbital pathology. Bony orbits intact. No orbital floor fracture. Lamina papyracea intact. Orbital roofs intact. Zygomatic arches intact. No acute maxillary fracture. Pterygoid plates intact. No acute nasal bone fracture. Mandible intact. Mandibular condyles normally situated within the temporomandibular fossa. Paranasal and this is are largely clear. CT CERVICAL SPINE FINDINGS Trace anterolisthesis of C4 on C5, likely chronic. The vertebral bodies are otherwise normally aligned with preservation of the normal cervical lordosis. Vertebral body heights are preserved. Normal C1-2 articulations are intact. No prevertebral soft tissue swelling. No acute fracture or listhesis. Moderate degenerative spondylolysis at C5-6 and C6-7. Scatter multilevel facet arthrosis noted. Visualized soft tissues of the neck demonstrate no acute abnormality. Scattered vascular calcifications about the carotid bifurcations. Visualized lung apices are clear without evidence of apical pneumothorax. IMPRESSION: CT HEAD: 1. Acute traumatic subarachnoid hemorrhage involving both cerebral  hemispheres as above, with additional 13 mm parenchymal contusion at the left temporal pole. 2. Small volume acute subdural hemorrhage overlying the right frontal convexity and falx.  3. Small volume acute intraventricular hemorrhage within the occipital horns of both lateral ventricles. This may be related to redistribution. No hydrocephalus or midline shift. 4. Large left frontal/periorbital scalp contusion. No calvarial fracture. CT MAXILLOFACIAL: 1. No acute maxillofacial fracture. 2. Large left forehead/periorbital and facial contusion. There is associated left enophthalmos. Left globe itself appears grossly intact without retro-orbital hematoma or other pathology. CT CERVICAL SPINE: No acute traumatic injury within the cervical spine. Critical Value/emergent results were called by telephone at the time of interpretation on 02/13/2016 at 5:21 am to Dr. Veryl Speak , who verbally acknowledged these results. Electronically Signed By: Jeannine Boga M.D. On: 02/13/2016 05:23     Review of Systems  Unable to perform ROS: mental acuity   Blood pressure 108/60, pulse 77, temperature 98.5 F (36.9 C), temperature source Oral, resp. rate 17, SpO2 100 %. Physical Exam  Vitals reviewed. Constitutional: He appears well-developed and well-nourished. He is cooperative. No distress. Cervical collar and nasal cannula in place.  HENT:  Head: Normocephalic. Head is with contusion. Head is without raccoon's eyes, without Battle's sign, without abrasion and without laceration.    Right Ear: Hearing, tympanic membrane, external ear and ear canal normal. No lacerations. No drainage or tenderness. No foreign bodies. Tympanic membrane is not perforated. No hemotympanum.  Left Ear: Hearing, tympanic membrane, external ear and ear canal normal. No lacerations. No drainage or tenderness. No foreign bodies. Tympanic membrane is not perforated. No hemotympanum.  Nose: Nose normal. No nose lacerations, sinus  tenderness, nasal deformity or nasal septal hematoma. No epistaxis.  Mouth/Throat: Uvula is midline, oropharynx is clear and moist and mucous membranes are normal. No lacerations. No oropharyngeal exudate.  Eyes: Conjunctivae, EOM and lids are normal. Pupils are equal, round, and reactive to light. No scleral icterus.    Neck: Trachea normal and normal range of motion. Neck supple. No JVD present. No spinous process tenderness and no muscular tenderness present. Carotid bruit is not present. No tracheal deviation present. No thyromegaly present.  Cardiovascular: Normal rate, regular rhythm, normal heart sounds, intact distal pulses and normal pulses. Exam reveals no gallop and no friction rub.  No murmur heard. Respiratory: Effort normal and breath sounds normal. No stridor. No respiratory distress. He has no wheezes. He has no rales. He exhibits no tenderness, no bony tenderness, no laceration and no crepitus.  GI: Soft. Normal appearance and bowel sounds are normal. He exhibits no distension. There is no tenderness. There is no rigidity, no rebound, no guarding and no CVA tenderness.  Genitourinary: Penis normal.  Musculoskeletal: Normal range of motion. He exhibits no edema or tenderness.  Lymphadenopathy:   He has no cervical adenopathy.  Neurological: He is alert. He has normal strength. No cranial nerve deficit or sensory deficit. GCS eye subscore is 4. GCS verbal subscore is 4. GCS motor subscore is 6.  Ox1 -- ED in [place] and 1999  Skin: Skin is warm, dry and intact. He is not diaphoretic.  Psychiatric: He has a normal mood and affect. His speech is normal and behavior is normal.    Assessment/Plan: Fall  TBI w/SAH, SDH -- Dr. Annette Stable to consult, HCT in am, TBI team Left periorbital swelling, laceration -- Dr. Katy Fitch to consult Multiple medical problems including uncontrolled DM -- IM to admit to manage    Lisette Abu, PA-C Pager: [phone] General Trauma PA Pager:  [phone] 02/13/2016, 8:47 AM

## 2016-02-14 ENCOUNTER — Inpatient Hospital Stay (HOSPITAL_COMMUNITY): Payer: 59

## 2016-02-14 DIAGNOSIS — I429 Cardiomyopathy, unspecified: Secondary | ICD-10-CM

## 2016-02-14 LAB — CBC
HEMATOCRIT: 29.9 % — AB (ref 39.0–52.0)
HEMOGLOBIN: 10 g/dL — AB (ref 13.0–17.0)
MCH: 31.4 pg (ref 26.0–34.0)
MCHC: 33.4 g/dL (ref 30.0–36.0)
MCV: 94 fL (ref 78.0–100.0)
Platelets: 185 10*3/uL (ref 150–400)
RBC: 3.18 MIL/uL — ABNORMAL LOW (ref 4.22–5.81)
RDW: 14.2 % (ref 11.5–15.5)
WBC: 7.1 10*3/uL (ref 4.0–10.5)

## 2016-02-14 LAB — GLUCOSE, CAPILLARY
GLUCOSE-CAPILLARY: 193 mg/dL — AB (ref 65–99)
GLUCOSE-CAPILLARY: 140 mg/dL — AB (ref 65–99)
GLUCOSE-CAPILLARY: 316 mg/dL — AB (ref 65–99)
Glucose-Capillary: 145 mg/dL — ABNORMAL HIGH (ref 65–99)
Glucose-Capillary: 204 mg/dL — ABNORMAL HIGH (ref 65–99)
Glucose-Capillary: 263 mg/dL — ABNORMAL HIGH (ref 65–99)

## 2016-02-14 LAB — BASIC METABOLIC PANEL
Anion gap: 10 (ref 5–15)
BUN: 11 mg/dL (ref 6–20)
CALCIUM: 7.9 mg/dL — AB (ref 8.9–10.3)
CHLORIDE: 105 mmol/L (ref 101–111)
CO2: 23 mmol/L (ref 22–32)
CREATININE: 0.79 mg/dL (ref 0.61–1.24)
GFR calc non Af Amer: >60 mL/min (ref >60)
GLUCOSE: 191 mg/dL — AB (ref 65–99)
Potassium: 3.7 mmol/L (ref 3.5–5.1)
Sodium: 138 mmol/L (ref 135–145)

## 2016-02-14 LAB — HEMOGLOBIN A1C
HEMOGLOBIN A1C: 8.4 % — AB (ref 4.8–5.6)
MEAN PLASMA GLUCOSE: 194 mg/dL

## 2016-02-14 LAB — URINE CULTURE

## 2016-02-14 LAB — ECHOCARDIOGRAM COMPLETE
HEIGHTINCHES: 67 in
WEIGHTICAEL: 2720 [oz_av]

## 2016-02-14 MED ORDER — INSULIN GLARGINE 100 UNIT/ML ~~LOC~~ SOLN: 26.0000 [IU] | Freq: Every day | SUBCUTANEOUS | Status: DC

## 2016-02-14 MED ORDER — INSULIN GLARGINE 100 UNIT/ML ~~LOC~~ SOLN
24.0000 [IU] | Freq: Every day | SUBCUTANEOUS | Status: DC
  Administered 2016-02-14: 24 [IU] via SUBCUTANEOUS
  Filled 2016-02-14 (×3): qty 0.24

## 2016-02-14 MED ORDER — INSULIN ASPART 100 UNIT/ML ~~LOC~~ SOLN
0.0000 [IU] | SUBCUTANEOUS | Status: DC
  Administered 2016-02-14: 11 [IU] via SUBCUTANEOUS
  Administered 2016-02-15: 7 [IU] via SUBCUTANEOUS
  Administered 2016-02-15: 3 [IU] via SUBCUTANEOUS
  Administered 2016-02-15: 4 [IU] via SUBCUTANEOUS

## 2016-02-14 MED ORDER — INSULIN GLARGINE 100 UNIT/ML ~~LOC~~ SOLN
10.0000 [IU] | Freq: Two times a day (BID) | SUBCUTANEOUS | Status: DC
  Administered 2016-02-14: 10 [IU] via SUBCUTANEOUS
  Filled 2016-02-14 (×2): qty 0.1

## 2016-02-14 NOTE — Evaluation (Addendum)
Physical Therapy Evaluation Patient Details Name: Dale Morrow MRN: [id number] DOB: September 21, 1941 Today's Date: 02/14/2016   History of Present Illness  75 y.o. male with a Past Medical History of left de Quervain's tenosynovitis, resting tremor, prostate cancer, diabetes, Bell's palsy, nonischemic cardiomyopathy with EF of 25%, GERD who presents with syncope with severe TBI and left eye injury  Clinical Impression  Patient demonstrates deficits in functional mobility as indicated below. Will need continued skilled PT to address deficits and maximize function. Will see as indicated and progress as tolerated. Recommend 24/7 assist upon discharge with HHPT for mobility in the home, hopes to progress to outpatient therapies to address balance and cognitive deficits.   OF NOTE: VSS with activity    Follow Up Recommendations Home health PT;Supervision/Assistance - 24 hour (safety assessment in home, hope to progress to outpatient)    Equipment Recommendations  None recommended by PT    Recommendations for Other Services       Precautions / Restrictions Precautions Precautions: Fall Restrictions Weight Bearing Restrictions: No      Mobility  Bed Mobility Overal bed mobility: Needs Assistance Bed Mobility: Supine to Sit;Sit to Supine     Supine to sit: Min guard Sit to supine: Min guard   General bed mobility comments: min guard for safety with mobility, no physical assist required  Transfers Overall transfer level: Needs assistance Equipment used: 1 person hand held assist Transfers: Sit to/from Stand Sit to Stand: Min assist         General transfer comment: min assist for stability, some posterior bias upon coming to standing  Ambulation/Gait Ambulation/Gait assistance: Min assist Ambulation Distance (Feet): 160 Feet (50 ft with RW) Assistive device: Rolling walker (2 wheeled);1 person hand held assist Gait Pattern/deviations: Step-through pattern;Decreased stride  length;Shuffle;Scissoring;Leaning posteriorly;Staggering left;Staggering right;Narrow base of support Gait velocity: decreased Gait velocity interpretation: Below normal speed for age/gender General Gait Details: instability noted with ambulation. Patient with several LOB requiring min assist to stabilize. Visual deficits impacting safe mobility at this time as well. (VCs for increased cadence with some modest improvements)  Stairs            Wheelchair Mobility    Modified Rankin (Stroke Patients Only)       Balance Overall balance assessment: Needs assistance;History of Falls Sitting-balance support: Feet supported Sitting balance-Leahy Scale: Fair       Standing balance-Leahy Scale: Poor               High level balance activites: Turns;Head turns High Level Balance Comments: increased physical assist for dynamic balance activities             Pertinent Vitals/Pain Pain Assessment: Faces Faces Pain Scale: Hurts a little bit Pain Descriptors / Indicators: Discomfort Pain Intervention(s): Monitored during session    Home Living Family/patient expects to be discharged to:: Private residence Living Arrangements: Spouse/significant other Available Help at Discharge: Family Type of Home: House Home Access: Stairs to enter   Technical brewer of Steps: 3 Home Layout: Two level;Bed/bath upstairs;1/2 bath on main level Home Equipment:  (insulin pump) Additional Comments: wife present to provide information, pt poor historian    Prior Function                 Hand Dominance   Dominant Hand: Right    Extremity/Trunk Assessment   Upper Extremity Assessment: Defer to OT evaluation           Lower Extremity Assessment: Overall WFL for tasks assessed  Communication   Communication: No difficulties  Cognition Arousal/Alertness: Awake/alert Behavior During Therapy: WFL for tasks assessed/performed Overall Cognitive Status:  Impaired/Different from baseline Area of Impairment: Orientation;Memory;Safety/judgement;Problem solving Orientation Level: Disoriented to;Place;Time;Situation   Memory: Decreased short-term memory   Safety/Judgement: Decreased awareness of safety;Decreased awareness of deficits   Problem Solving: Requires verbal cues;Requires tactile cues General Comments: Patient with confusion since fall per wife, patient with confabulation as to what happened that caused his fall stating that he was attached to moniotrs at home and he tripped over them when he was OOB. Wife reports fall happened on stairs at home.     General Comments General comments (skin integrity, edema, etc.): facial bruising and edema noted, discharge from right eye, nsg aware    Exercises        Assessment/Plan    PT Assessment Patient needs continued PT services  PT Diagnosis Difficulty walking;Abnormality of gait;Acute pain;Altered mental status   PT Problem List Decreased activity tolerance;Decreased balance;Decreased mobility;Decreased coordination;Decreased cognition;Decreased knowledge of use of DME;Decreased safety awareness;Pain  PT Treatment Interventions DME instruction;Gait training;Stair training;Functional mobility training;Therapeutic activities;Therapeutic exercise;Balance training;Patient/family education   PT Goals (Current goals can be found in the Care Plan section) Acute Rehab PT Goals Patient Stated Goal: to go home PT Goal Formulation: With patient/family Time For Goal Achievement: 02/28/16 Potential to Achieve Goals: Good    Frequency Min 3X/week   Barriers to discharge        Co-evaluation               End of Session Equipment Utilized During Treatment: Gait belt Activity Tolerance: Patient tolerated treatment well Patient left: in bed;with call bell/phone within reach;with bed alarm set;with family/visitor present Nurse Communication: Mobility status         Time:  1015-1040 PT Time Calculation (min) (ACUTE ONLY): 25 min   Charges:   PT Evaluation $PT Eval Moderate Complexity: 1 Procedure PT Treatments $Gait Training: 8-22 mins   PT G CodesDuncan Dull 2016/03/05, 10:58 AM Alben Deeds, PT DPT  [phone]

## 2016-02-14 NOTE — Progress Notes (Signed)
Report completed with RN Ivar Drape on unit [facility], pt will transfer via wheelchair with his belongings, wife at bedside.

## 2016-02-14 NOTE — Consult Note (Signed)
[facility] TEAM 1 - Stepdown/ICU TEAM CONSULT F/U NOTE  Dale Morrow [id number] DOB: 30-Apr-1941 DOA: 02/13/2016 PCP: Jonathon Bellows, MD  Admit HPI / Brief Narrative: 75 y.o. male with a hx of L de Quervain's tenosynovitis, resting tremor, prostate cancer, diabetes, Bell's palsy, nonischemic cardiomyopathy with EF of 25%, and GERD who presented to the ED following a syncopal episode with severe TBI and left eye injury. Patient was admitted by Trauma Service. Ophthalmology and Neurosurgery have been following. [facility] was consulted for management of his chronic medical conditions.  HPI/Subjective: Mental status waxing and waning per RN and wife.  Pt denies any complaints.    Recommendations/Plan:  DM on insulin pump at home - off pump for now due to cognitive deficits - CBG poorly controlled - adjust tx and follow   Unwitnessed syncopal spell No worrisome findings on TTE   TBI w/ multiple areas of SAH, SDH plus bilateral IVH  Left eye injury with laceration  History of Takotsubo Cardiomyopathy Followed by [facility] - recovered as of Aug 2016 w/ EF 60-65% - TTE essentially normal this admit   Nonobstructive CAD Per cardiac catheterization July 2016  Normocytic anemia  Essential tremor / Chronic Facial paresthesia / cervical degeneration with bilateral hand paresthesias   Code Status: FULL Family Communication: spoke w/ wife at bedside   Antibiotics: Clindamycin 5/2  DVT prophylaxis: SCDs  Objective: Blood pressure 130/60, pulse 63, temperature 98.7 F (37.1 C), temperature source Oral, resp. rate 16, height 5\' 7"  (1.702 m), weight 77.111 kg (170 lb), SpO2 100 %.  Intake/Output Summary (Last 24 hours) at 02/14/16 1733 Last data filed at 02/14/16 1000  Gross per 24 hour  Intake 1321.25 ml  Output   1250 ml  Net  71.25 ml   Exam: General: No acute respiratory distress Lungs: Clear to auscultation bilaterally without wheezes or crackles Cardiovascular: Regular  rate and rhythm without murmur gallop or rub normal S1 and S2 Abdomen: Nontender, nondistended, soft, bowel sounds positive, no rebound, no ascites, no appreciable mass Extremities: No significant cyanosis, clubbing, or edema bilateral lower extremities  Data Reviewed: Basic Metabolic Panel:  Recent Labs Lab 02/13/16 0322 02/13/16 0821 02/14/16 0350  NA 136 139 138  K 5.8* 4.7 3.7  CL 99* 104 105  CO2 22 20* 23  GLUCOSE 605* 355* 191*  BUN 30* 22* 11  CREATININE 1.20 1.10 0.79  CALCIUM 8.8* 8.4* 7.9*    CBC:  Recent Labs Lab 02/13/16 0322 02/13/16 0821 02/14/16 0350  WBC 11.1* 8.0 7.1  NEUTROABS 9.4* 6.5  --   HGB 11.7* 10.4* 10.0*  HCT 35.9* 31.3* 29.9*  MCV 94.5 92.3 94.0  PLT 202 201 185    Liver Function Tests:  Recent Labs Lab 02/13/16 0322  AST 53*  ALT 34  ALKPHOS 84  BILITOT 1.2  PROT 7.3  ALBUMIN 4.0   Coags:  Recent Labs Lab 02/13/16 0821  INR 1.23    CBG:  Recent Labs Lab 02/13/16 2328 02/14/16 0347 02/14/16 0903 02/14/16 1147 02/14/16 1620  GLUCAP 296* 193* 140* 204* 316*    Recent Results (from the past 240 hour(s))  Urine culture     Status: Abnormal   Collection Time: 02/13/16 10:45 AM  Result Value Ref Range Status   Specimen Description URINE, RANDOM  Final   Special Requests NONE  Final   Culture MULTIPLE SPECIES PRESENT, SUGGEST RECOLLECTION (A)  Final   Report Status 02/14/2016 FINAL  Final  MRSA PCR Screening  Status: None   Collection Time: 02/13/16  5:05 PM  Result Value Ref Range Status   MRSA by PCR NEGATIVE NEGATIVE Final    Comment:        The GeneXpert MRSA Assay (FDA approved for NASAL specimens only), is one component of a comprehensive MRSA colonization surveillance program. It is not intended to diagnose MRSA infection nor to guide or monitor treatment for MRSA infections.      Studies:   Recent x-ray studies have been reviewed in detail by the Attending Physician  Scheduled  Meds:  Scheduled Meds: . bacitracin-polymyxin b   Left Eye TID  . docusate sodium  100 mg Oral BID  . dorzolamide-timolol  1 drop Left Eye BID  . escitalopram  20 mg Oral Daily  . insulin aspart  0-9 Units Subcutaneous Q4H  . insulin glargine  10 Units Subcutaneous BID  . lisinopril  2.5 mg Oral BID  . pantoprazole  40 mg Oral Daily  . polyethylene glycol powder  17 g Oral QHS  . pregabalin  100 mg Oral TID  . primidone  100 mg Oral Q8H  . propranolol ER  60 mg Oral Daily  . rosuvastatin  10 mg Oral Daily    Time spent on care of this patient: 25 mins   [facility] T , MD   [facility] Office  [phone] Pager - Text Page per Shea Evans as per below:  On-Call/Text Page:      [email or web address]      password [email or web address]  If 7PM-7AM, please contact night-coverage [email or web address] Password [email or web address] 02/14/2016, 5:33 PM   LOS: 1 day

## 2016-02-14 NOTE — Progress Notes (Signed)
Trauma Service Note  Subjective: Patient is a little bit confused, not in distress.  This is new.  Objective: Vital signs in last 24 hours: Temp:  [98.4 F (36.9 C)-98.8 F (37.1 C)] 98.4 F (36.9 C) (05/03 0400) Pulse Rate:  [59-137] 62 (05/03 0800) Resp:  [11-20] 17 (05/03 0800) BP: (102-134)/(49-67) 109/61 mmHg (05/03 0800) SpO2:  [78 %-100 %] 100 % (05/03 0800) Weight:  [77.111 kg (170 lb)] 77.111 kg (170 lb) (05/02 1635) Last BM Date:  (PTA )  Intake/Output from previous day: 05/02 0701 - 05/03 0700 In: 1041.3 [I.V.:1041.3] Out: 1150 [Urine:1150] Intake/Output this shift: Total I/O In: 75 [I.V.:75] Out: 200 [Urine:200]  General: No acute distress.  Lungs: Clear to auscultation  Abd: Soft, good bowel sounds., not tender  Extremities: No changes  Neuro: Confused, otherwise without focal deficits.  Lab Results: CBC   Recent Labs  02/13/16 0821 02/14/16 0350  WBC 8.0 7.1  HGB 10.4* 10.0*  HCT 31.3* 29.9*  PLT 201 185   BMET  Recent Labs  02/13/16 0821 02/14/16 0350  NA 139 138  K 4.7 3.7  CL 104 105  CO2 20* 23  GLUCOSE 355* 191*  BUN 22* 11  CREATININE 1.10 0.79  CALCIUM 8.4* 7.9*   PT/INR  Recent Labs  02/13/16 0821  LABPROT 15.7*  INR 1.23   ABG No results for input(s): PHART, HCO3 in the last 72 hours.  Invalid input(s): PCO2, PO2  Studies/Results: Dg Chest 1 View  02/13/2016  CLINICAL DATA:  Increasing leg pain after a fall yesterday. EXAM: CHEST 1 VIEW COMPARISON:  04/21/2015 FINDINGS: Shallow inspiration. Normal heart size and pulmonary vascularity. No focal airspace disease or consolidation in the lungs. No blunting of costophrenic angles. No pneumothorax. Mediastinal contours appear intact. IMPRESSION: No active disease. Electronically Signed   By: Lucienne Capers M.D.   On: 02/13/2016 04:44   Ct Head Wo Contrast  02/14/2016  CLINICAL DATA:  Follow-up traumatic brain injury. EXAM: CT HEAD WITHOUT CONTRAST TECHNIQUE: Contiguous  axial images were obtained from the base of the skull through the vertex without intravenous contrast. COMPARISON:  02/13/2016 FINDINGS: Multifocal acute intracranial hemorrhage again demonstrated. Acute subdural hemorrhage demonstrated focally along the anterior falx as well as in the left sylvian fissure region. Multifocal areas of intraparenchymal hemorrhage demonstrated predominate along the subcortical or cortical surfaces in the frontal lobes bilaterally along the convexity, and in the left parietal lobe. Mild intraventricular hemorrhage in the posterior horns of the lateral ventricles bilaterally. No significant progression in these areas since the previous study. Subdural Hemorrhage along the right anterior frontal region and subarachnoid hemorrhage along the parafalcine sulci is less distinct than on the previous study. mild cerebral atrophy. Mild ventricular dilatation is likely due to central atrophy. No mass effect or midline shift. Gray-white matter junctions are distinct. Large subcutaneous scalp hematoma in the left anterior frontal region extending to the temporal region and left side of the face is again demonstrated, slightly smaller than previous study. IMPRESSION: Multifocal acute intracranial hemorrhage, including subdural, subarachnoid, intraparenchymal, intraventricular hemorrhage. No progression since previous study. Some areas are becoming less distinct. Electronically Signed   By: Lucienne Capers M.D.   On: 02/14/2016 05:49   Ct Head Wo Contrast  02/13/2016  CLINICAL DATA:  Initial evaluation for acute trauma, fall. EXAM: CT HEAD WITHOUT CONTRAST CT MAXILLOFACIAL WITHOUT CONTRAST CT CERVICAL SPINE WITHOUT CONTRAST TECHNIQUE: Multidetector CT imaging of the head, cervical spine, and maxillofacial structures were performed using the standard protocol  without intravenous contrast. Multiplanar CT image reconstructions of the cervical spine and maxillofacial structures were also generated.  COMPARISON:  Prior study from 04/19/2015. FINDINGS: CT HEAD FINDINGS Large left periorbital and frontal scalp contusion. Globes intact. No retro-orbital hematoma or other pathology. No calvarial fracture.  Mastoid air cells are clear. Age-related cerebral atrophy with chronic microvascular ischemic disease present. No acute large vessel territory infarct. Acute subdural/ extra-axial hemorrhage overlies the right frontal convexity measuring up to 11 mm. Scattered subdural blood along the falx with a left parafalcine subdural hematoma measuring up to 4 mm. There is associated scattered acute subarachnoid hemorrhage within the right frontal lobe and parasagittal left frontal parietal lobes. Subarachnoid blood along the superior aspect of the left sylvian fissure. 13 mm contusion at the left anterior temporal pole. Small amount of hemorrhage along the septum pellucidum measures up to 9 mm. Acute subarachnoid hemorrhage within the posterior right temporal lobe. No midline shift. No hydrocephalus. Small amount of layering intraventricular hemorrhage within the occipital horns bilaterally. Basilar cisterns remain patent. Superimposed chronic appearing bilateral subdural hygroma is measure up to 5-6 mm bilaterally. CT MAXILLOFACIAL FINDINGS Large left forehead/left periorbital and facial contusion. Mild left-sided enophthalmos. The left globe itself appears intact without retro-orbital hematoma or other pathology. Right globe intact without retro-orbital pathology. Bony orbits intact. No orbital floor fracture. Lamina papyracea intact. Orbital roofs intact. Zygomatic arches intact. No acute maxillary fracture. Pterygoid plates intact. No acute nasal bone fracture. Mandible intact. Mandibular condyles normally situated within the temporomandibular fossa. Paranasal and this is are largely clear. CT CERVICAL SPINE FINDINGS Trace anterolisthesis of C4 on C5, likely chronic. The vertebral bodies are otherwise normally aligned  with preservation of the normal cervical lordosis. Vertebral body heights are preserved. Normal C1-2 articulations are intact. No prevertebral soft tissue swelling. No acute fracture or listhesis. Moderate degenerative spondylolysis at C5-6 and C6-7. Scatter multilevel facet arthrosis noted. Visualized soft tissues of the neck demonstrate no acute abnormality. Scattered vascular calcifications about the carotid bifurcations. Visualized lung apices are clear without evidence of apical pneumothorax. IMPRESSION: CT HEAD: 1. Acute traumatic subarachnoid hemorrhage involving both cerebral hemispheres as above, with additional 13 mm parenchymal contusion at the left temporal pole. 2. Small volume acute subdural hemorrhage overlying the right frontal convexity and falx. 3. Small volume acute intraventricular hemorrhage within the occipital horns of both lateral ventricles. This may be related to redistribution. No hydrocephalus or midline shift. 4. Large left frontal/periorbital scalp contusion. No calvarial fracture. CT MAXILLOFACIAL: 1. No acute maxillofacial fracture. 2. Large left forehead/periorbital and facial contusion. There is associated left enophthalmos. Left globe itself appears grossly intact without retro-orbital hematoma or other pathology. CT CERVICAL SPINE: No acute traumatic injury within the cervical spine. Critical Value/emergent results were called by telephone at the time of interpretation on 02/13/2016 at 5:21 am to Dr. Veryl Speak , who verbally acknowledged these results. Electronically Signed   By: Jeannine Boga M.D.   On: 02/13/2016 05:23   Ct Cervical Spine Wo Contrast  02/13/2016  CLINICAL DATA:  Initial evaluation for acute trauma, fall. EXAM: CT HEAD WITHOUT CONTRAST CT MAXILLOFACIAL WITHOUT CONTRAST CT CERVICAL SPINE WITHOUT CONTRAST TECHNIQUE: Multidetector CT imaging of the head, cervical spine, and maxillofacial structures were performed using the standard protocol without  intravenous contrast. Multiplanar CT image reconstructions of the cervical spine and maxillofacial structures were also generated. COMPARISON:  Prior study from 04/19/2015. FINDINGS: CT HEAD FINDINGS Large left periorbital and frontal scalp contusion. Globes intact. No retro-orbital hematoma or  other pathology. No calvarial fracture.  Mastoid air cells are clear. Age-related cerebral atrophy with chronic microvascular ischemic disease present. No acute large vessel territory infarct. Acute subdural/ extra-axial hemorrhage overlies the right frontal convexity measuring up to 11 mm. Scattered subdural blood along the falx with a left parafalcine subdural hematoma measuring up to 4 mm. There is associated scattered acute subarachnoid hemorrhage within the right frontal lobe and parasagittal left frontal parietal lobes. Subarachnoid blood along the superior aspect of the left sylvian fissure. 13 mm contusion at the left anterior temporal pole. Small amount of hemorrhage along the septum pellucidum measures up to 9 mm. Acute subarachnoid hemorrhage within the posterior right temporal lobe. No midline shift. No hydrocephalus. Small amount of layering intraventricular hemorrhage within the occipital horns bilaterally. Basilar cisterns remain patent. Superimposed chronic appearing bilateral subdural hygroma is measure up to 5-6 mm bilaterally. CT MAXILLOFACIAL FINDINGS Large left forehead/left periorbital and facial contusion. Mild left-sided enophthalmos. The left globe itself appears intact without retro-orbital hematoma or other pathology. Right globe intact without retro-orbital pathology. Bony orbits intact. No orbital floor fracture. Lamina papyracea intact. Orbital roofs intact. Zygomatic arches intact. No acute maxillary fracture. Pterygoid plates intact. No acute nasal bone fracture. Mandible intact. Mandibular condyles normally situated within the temporomandibular fossa. Paranasal and this is are largely clear.  CT CERVICAL SPINE FINDINGS Trace anterolisthesis of C4 on C5, likely chronic. The vertebral bodies are otherwise normally aligned with preservation of the normal cervical lordosis. Vertebral body heights are preserved. Normal C1-2 articulations are intact. No prevertebral soft tissue swelling. No acute fracture or listhesis. Moderate degenerative spondylolysis at C5-6 and C6-7. Scatter multilevel facet arthrosis noted. Visualized soft tissues of the neck demonstrate no acute abnormality. Scattered vascular calcifications about the carotid bifurcations. Visualized lung apices are clear without evidence of apical pneumothorax. IMPRESSION: CT HEAD: 1. Acute traumatic subarachnoid hemorrhage involving both cerebral hemispheres as above, with additional 13 mm parenchymal contusion at the left temporal pole. 2. Small volume acute subdural hemorrhage overlying the right frontal convexity and falx. 3. Small volume acute intraventricular hemorrhage within the occipital horns of both lateral ventricles. This may be related to redistribution. No hydrocephalus or midline shift. 4. Large left frontal/periorbital scalp contusion. No calvarial fracture. CT MAXILLOFACIAL: 1. No acute maxillofacial fracture. 2. Large left forehead/periorbital and facial contusion. There is associated left enophthalmos. Left globe itself appears grossly intact without retro-orbital hematoma or other pathology. CT CERVICAL SPINE: No acute traumatic injury within the cervical spine. Critical Value/emergent results were called by telephone at the time of interpretation on 02/13/2016 at 5:21 am to Dr. Veryl Speak , who verbally acknowledged these results. Electronically Signed   By: Jeannine Boga M.D.   On: 02/13/2016 05:23   Dg Hip Unilat With Pelvis 2-3 Views Left  02/13/2016  CLINICAL DATA:  Increasing pain after a fall. EXAM: DG HIP (WITH OR WITHOUT PELVIS) 2-3V LEFT COMPARISON:  None. FINDINGS: Pelvis and left hip appear intact. No evidence  of acute fracture or dislocation. No focal bone lesion or bone destruction. Vascular calcifications. Surgical clips over the symphysis pubis. IMPRESSION: No acute bony abnormalities. Electronically Signed   By: Lucienne Capers M.D.   On: 02/13/2016 04:45   Dg Femur Min 2 Views Left  02/13/2016  CLINICAL DATA:  Increasing pain and difficulty weight-bearing after a fall late afternoon of 02/12/2016. EXAM: LEFT FEMUR 2 VIEWS COMPARISON:  None. FINDINGS: Left total knee arthroplasty. No evidence of acute fracture or dislocation. No focal bone lesion or  bone destruction. Soft tissues are unremarkable. IMPRESSION: No acute bony abnormalities. Electronically Signed   By: Lucienne Capers M.D.   On: 02/13/2016 04:44   Ct Maxillofacial Wo Cm  02/13/2016  CLINICAL DATA:  Initial evaluation for acute trauma, fall. EXAM: CT HEAD WITHOUT CONTRAST CT MAXILLOFACIAL WITHOUT CONTRAST CT CERVICAL SPINE WITHOUT CONTRAST TECHNIQUE: Multidetector CT imaging of the head, cervical spine, and maxillofacial structures were performed using the standard protocol without intravenous contrast. Multiplanar CT image reconstructions of the cervical spine and maxillofacial structures were also generated. COMPARISON:  Prior study from 04/19/2015. FINDINGS: CT HEAD FINDINGS Large left periorbital and frontal scalp contusion. Globes intact. No retro-orbital hematoma or other pathology. No calvarial fracture.  Mastoid air cells are clear. Age-related cerebral atrophy with chronic microvascular ischemic disease present. No acute large vessel territory infarct. Acute subdural/ extra-axial hemorrhage overlies the right frontal convexity measuring up to 11 mm. Scattered subdural blood along the falx with a left parafalcine subdural hematoma measuring up to 4 mm. There is associated scattered acute subarachnoid hemorrhage within the right frontal lobe and parasagittal left frontal parietal lobes. Subarachnoid blood along the superior aspect of the left  sylvian fissure. 13 mm contusion at the left anterior temporal pole. Small amount of hemorrhage along the septum pellucidum measures up to 9 mm. Acute subarachnoid hemorrhage within the posterior right temporal lobe. No midline shift. No hydrocephalus. Small amount of layering intraventricular hemorrhage within the occipital horns bilaterally. Basilar cisterns remain patent. Superimposed chronic appearing bilateral subdural hygroma is measure up to 5-6 mm bilaterally. CT MAXILLOFACIAL FINDINGS Large left forehead/left periorbital and facial contusion. Mild left-sided enophthalmos. The left globe itself appears intact without retro-orbital hematoma or other pathology. Right globe intact without retro-orbital pathology. Bony orbits intact. No orbital floor fracture. Lamina papyracea intact. Orbital roofs intact. Zygomatic arches intact. No acute maxillary fracture. Pterygoid plates intact. No acute nasal bone fracture. Mandible intact. Mandibular condyles normally situated within the temporomandibular fossa. Paranasal and this is are largely clear. CT CERVICAL SPINE FINDINGS Trace anterolisthesis of C4 on C5, likely chronic. The vertebral bodies are otherwise normally aligned with preservation of the normal cervical lordosis. Vertebral body heights are preserved. Normal C1-2 articulations are intact. No prevertebral soft tissue swelling. No acute fracture or listhesis. Moderate degenerative spondylolysis at C5-6 and C6-7. Scatter multilevel facet arthrosis noted. Visualized soft tissues of the neck demonstrate no acute abnormality. Scattered vascular calcifications about the carotid bifurcations. Visualized lung apices are clear without evidence of apical pneumothorax. IMPRESSION: CT HEAD: 1. Acute traumatic subarachnoid hemorrhage involving both cerebral hemispheres as above, with additional 13 mm parenchymal contusion at the left temporal pole. 2. Small volume acute subdural hemorrhage overlying the right frontal  convexity and falx. 3. Small volume acute intraventricular hemorrhage within the occipital horns of both lateral ventricles. This may be related to redistribution. No hydrocephalus or midline shift. 4. Large left frontal/periorbital scalp contusion. No calvarial fracture. CT MAXILLOFACIAL: 1. No acute maxillofacial fracture. 2. Large left forehead/periorbital and facial contusion. There is associated left enophthalmos. Left globe itself appears grossly intact without retro-orbital hematoma or other pathology. CT CERVICAL SPINE: No acute traumatic injury within the cervical spine. Critical Value/emergent results were called by telephone at the time of interpretation on 02/13/2016 at 5:21 am to Dr. Veryl Speak , who verbally acknowledged these results. Electronically Signed   By: Jeannine Boga M.D.   On: 02/13/2016 05:23    Anti-infectives: Anti-infectives    Start     Dose/Rate Route Frequency Ordered  Stop   02/13/16 0930  clindamycin (CLEOCIN) IVPB 600 mg     600 mg 100 mL/hr over 30 Minutes Intravenous  Once 02/13/16 0920 02/13/16 1005      Assessment/Plan: s/p  Advance diet Transfer to 47M  TBI team consultation.  LOS: 1 day   Kathryne Eriksson. Dahlia Bailiff, MD, FACS [phone] Trauma Surgeon 02/14/2016

## 2016-02-14 NOTE — Progress Notes (Signed)
Pt arrived to unit. Oriented to room. Telemetry started.

## 2016-02-14 NOTE — Progress Notes (Signed)
Wife moved floor mat away from side of bed,  night rn aware.

## 2016-02-14 NOTE — Evaluation (Signed)
Occupational Therapy Evaluation Patient Details Name: NICKLOS COBBS MRN: [id number] DOB: [birth year] Today's Date: 02/14/2016    History of Present Illness 75 y.o. male with a Past Medical History of left de Quervain's tenosynovitis, resting tremor, prostate cancer, diabetes, Bell's palsy, nonischemic cardiomyopathy with EF of 25%, GERD who presents with syncope with severe TBI and left eye injury   Clinical Impression   This 75 yo male admitted with above presents to acute OT with deficits below (see OT problem list) thus affecting his ability to care for himself at an independent level as he was pta. He will benefit from acute OT with follow up [place] and will need 24 hour S with prn A due to decreased balance and cognitive deficits.    Follow Up Recommendations  Home health OT;Supervision/Assistance - 24 hour    Equipment Recommendations  Tub/shower seat       Precautions / Restrictions Precautions Precautions: Fall Restrictions Weight Bearing Restrictions: No      Mobility Bed Mobility Overal bed mobility: Needs Assistance Bed Mobility: Supine to Sit;Sit to Supine     Supine to sit: Min guard Sit to supine: Min guard   General bed mobility comments: min guard for safety with mobility, no physical assist required  Transfers Overall transfer level: Needs assistance Equipment used: 1 person hand held assist Transfers: Sit to/from Stand Sit to Stand: Min assist         General transfer comment: min A for stability, posterior bias when stand>sit (decreased control)    Balance Overall balance assessment: Needs assistance Sitting-balance support: Feet supported;No upper extremity supported Sitting balance-Leahy Scale: Good Sitting balance - Comments: Could sit EOB and doff/donn socks without LOB   Standing balance support: Single extremity supported Standing balance-Leahy Scale: Poor Standing balance comment: a couple of LOB while in standing that needed A to  correct                            ADL Overall ADL's : Needs assistance/impaired Eating/Feeding: Independent;Sitting   Grooming: Wash/dry hands;Minimal assistance;Standing Grooming Details (indicate cue type and reason): One loss of balance posteriorly that I had to help him correct while he was standing at sink Upper Body Bathing: Set up;Supervision/ safety;Sitting   Lower Body Bathing: Minimal assistance;Sit to/from stand   Upper Body Dressing : Set up;Supervision/safety;Sitting   Lower Body Dressing: Minimal assistance;Sit to/from stand   Toilet Transfer: Minimal assistance;Ambulation;Regular Toilet;Grab bars   Toileting- Clothing Manipulation and Hygiene: Minimal assistance;Sit to/from stand               Vision Vision Assessment?: Yes Ocular Range of Motion: Within Functional Limits (for right eye) Tracking/Visual Pursuits: Able to track stimulus in all quads without difficulty (with right eye) Visual Fields: No apparent deficits (for right eye)          Pertinent Vitals/Pain Pain Assessment: No/denies pain     Hand Dominance Right   Extremity/Trunk Assessment Upper Extremity Assessment Upper Extremity Assessment: Overall WFL for tasks assessed           Communication Communication Communication: No difficulties   Cognition Arousal/Alertness: Awake/alert Behavior During Therapy: WFL for tasks assessed/performed Overall Cognitive Status: Impaired/Different from baseline Area of Impairment: Orientation;Memory;Following commands;Problem solving Orientation Level: Disoriented to;Time;Situation (1980, don't know was initial response to why are you here--with cues as to why his face was all bruised and swollen he finally said, "I must have fallen")   Memory: Decreased short-term  memory (asked pt to take off left sock, put back on, take off right sock, put back on. He started with taking left off, but then started taking right off before putting left  back on as I had instructed) Following Commands: Follows multi-step commands inconsistently;Follows one step commands consistently (see memory above)     Problem Solving: Requires verbal cues;Requires tactile cues                Home Living Family/patient expects to be discharged to:: Private residence Living Arrangements: Spouse/significant other Available Help at Discharge: Family Type of Home: House Home Access: Stairs to enter Technical brewer of Steps: 3   Home Layout: Two level;Bed/bath upstairs;1/2 bath on main level Alternate Level Stairs-Number of Steps: flight   Bathroom Shower/Tub: Tub/shower unit Shower/tub characteristics: Architectural technologist: Standard     Home Equipment:  (insulin pump)   Additional Comments: wife present to provide information      Prior Functioning/Environment Level of Independence: Independent (per pt)             OT Diagnosis: Generalized weakness;Cognitive deficits;Disturbance of vision   OT Problem List: Decreased strength;Impaired balance (sitting and/or standing);Impaired vision/perception;Decreased cognition;Decreased safety awareness   OT Treatment/Interventions: Self-care/ADL training;Patient/family education;Visual/perceptual remediation/compensation;Balance training;Therapeutic activities;DME and/or AE instruction;Cognitive remediation/compensation    OT Goals(Current goals can be found in the care plan section) Acute Rehab OT Goals Patient Stated Goal: to go home OT Goal Formulation: With patient Time For Goal Achievement: 02/21/16 Potential to Achieve Goals: Good  OT Frequency: Min 3X/week              End of Session Equipment Utilized During Treatment: Gait belt Nurse Communication:  Freight forwarder and nurse: pt +1 min A with gait belt)  Activity Tolerance: Patient tolerated treatment well Patient left: in bed;with call bell/phone within reach (headed down for a test)   Time: [id number] OT Time  Calculation (min): 21 min Charges:  OT General Charges $OT Visit: 1 Procedure OT Evaluation $OT Eval Moderate Complexity: 1 Procedure  Almon Register [id number] 02/14/2016, 2:49 PM

## 2016-02-14 NOTE — Progress Notes (Signed)
Patient still mildly confused but looks much better today. No headaches. Walking around the unit with minimal assistance.  Awake and alert. Oriented and reasonably appropriate. Motor and sensory function in the extremities normal.  Follow-up head CT scan demonstrates some clearing of his traumatic subarachnoid hemorrhage. His small cortical contusions are stable. The small right frontal subdural hematoma is regressing.  Status post fall with traumatic brain injury. Continue efforts at mobilization. No indications for further scanning at this point.

## 2016-02-14 NOTE — Progress Notes (Signed)
Attempted to call report to unit [facility], nurse not available at this time (charge nurse not available either at this time). Number given for receiving RN to return call for report.

## 2016-02-14 NOTE — Progress Notes (Signed)
  Echocardiogram 2D Echocardiogram has been performed.  Dale Morrow 02/14/2016, 2:44 PM

## 2016-02-14 NOTE — Progress Notes (Signed)
Pt to echo.

## 2016-02-15 DIAGNOSIS — S0532XA Ocular laceration without prolapse or loss of intraocular tissue, left eye, initial encounter: Secondary | ICD-10-CM

## 2016-02-15 LAB — GLUCOSE, CAPILLARY
GLUCOSE-CAPILLARY: 165 mg/dL — AB (ref 65–99)
GLUCOSE-CAPILLARY: 220 mg/dL — AB (ref 65–99)
GLUCOSE-CAPILLARY: 242 mg/dL — AB (ref 65–99)
Glucose-Capillary: 94 mg/dL (ref 65–99)
Glucose-Capillary: 241 mg/dL — ABNORMAL HIGH (ref 65–99)

## 2016-02-15 MED ORDER — INSULIN PUMP
Freq: Three times a day (TID) | SUBCUTANEOUS | Status: DC
  Administered 2016-02-15 – 2016-02-16 (×2): via SUBCUTANEOUS
  Filled 2016-02-15: qty 1

## 2016-02-15 MED ORDER — INSULIN ASPART 100 UNIT/ML ~~LOC~~ SOLN: 0.0000 [IU] | Freq: Every day | SUBCUTANEOUS | Status: DC

## 2016-02-15 MED ORDER — INSULIN ASPART 100 UNIT/ML ~~LOC~~ SOLN
3.0000 [IU] | Freq: Three times a day (TID) | SUBCUTANEOUS | Status: DC
  Administered 2016-02-15: 3 [IU] via SUBCUTANEOUS

## 2016-02-15 MED ORDER — INSULIN ASPART 100 UNIT/ML ~~LOC~~ SOLN
0.0000 [IU] | Freq: Three times a day (TID) | SUBCUTANEOUS | Status: DC
  Administered 2016-02-15: 5 [IU] via SUBCUTANEOUS

## 2016-02-15 MED ORDER — POLYETHYLENE GLYCOL 3350 17 G PO PACK
17.0000 g | PACK | Freq: Every day | ORAL | Status: DC
  Administered 2016-02-15: 17 g via ORAL

## 2016-02-15 NOTE — Progress Notes (Signed)
PROGRESS NOTE  Dale Morrow  [id number] DOB: Dec 18, 1940 DOA: 02/13/2016 PCP: Jonathon Bellows, MD Outpatient Specialists:  Dr. Buddy Duty, Endocrinology  Brief Narrative:  75 y.o. male with a hx of L de Quervain's tenosynovitis, resting tremor, prostate cancer, diabetes, Bell's palsy, nonischemic cardiomyopathy with EF of 25%, and GERD who presented to the ED following a syncopal episode with severe TBI and left eye injury. Patient was admitted by Trauma Service. Ophthalmology and Neurosurgery have been following. TRH was consulted for management of his chronic medical conditions.  Assessment & Plan:  TBI w/ multiple areas of SAH, SDH plus bilateral IVH.  Neurosurgery managing.  Last head CT on 5/3 demonstrated resolving subdural, intraparenchymal, and intraventricular hemorrhage.  No further imaging her neurosurgery's last note.  Left eye injury with laceration s/p simple closure with 5-0 vicryl sutures of his left eye lacteration.  Ophthalmology recommended antibiotic ointment TID and dorzolamide-timolol drops BID (left eye only). -  Follow up with Dr. Rod Holler Groat in 5 days for dilated exam  DM type 2, A1c 8.4 on 02/13/2016 -  Resume insulin pump near time of discharge > wife to bring in supplies, including new tubing and vial.  Diabetic educator to please assist.   -  Continue lantus 24 units with high dose SSI q4h pending arrival of supplies  Unwitnessed syncopal spell, patient does not recall event, suspect concussion.  May have been orthostatic due to hyperglycemia probably due to pump (or site) malfunction.  Mild dehydration was suggested by improvement in creatinine and decline in hemoglobin with IVF.   -  Tele:  NSR -  No worrisome findings on TTE  -  No witnessed seizure activity  History of Takotsubo Cardiomyopathy Followed by [facility] Cardiology - recovered as of Aug 2016 w/ EF 60-65% - TTE essentially normal this admit   Nonobstructive CAD Per cardiac catheterization July  2016  Normocytic anemia, hemoglobin stable between 9 and 11 mg/dl  Essential tremor / Chronic Facial paresthesia / cervical degeneration with bilateral hand paresthesias -  Followed as outpatient by Neurology  DVT prophylaxis:  None Code Status:  full Family Communication:  Patient and his wife who was at bedside Disposition Plan:  Likely to home with [facility] services  Subjective:  Denies pain, states he feels fine.  Denies SOB, nausea, vomiting, cough, abdominal pain, focal numbness or weakness, confusion, blurry vision.    Objective: Filed Vitals:   02/15/16 0107 02/15/16 0501 02/15/16 0832 02/15/16 0921  BP: 119/53 130/62 109/58 120/59  Pulse: 58 54 66 57  Temp: 98.4 F (36.9 C) 98.4 F (36.9 C)  98.3 F (36.8 C)  TempSrc: Oral Oral  Oral  Resp: 20 20 20 20   Height:      Weight:      SpO2: 100% 98%  94%    Intake/Output Summary (Last 24 hours) at 02/15/16 1250 Last data filed at 02/15/16 1210  Gross per 24 hour  Intake    360 ml  Output    100 ml  Net    260 ml   Filed Weights   02/13/16 1635  Weight: 77.111 kg (170 lb)    Examination:  General exam:  Adult male.  No acute distress.  Appears calm and comfortable  HEENT:  Two black eyes with ecchymoses across bridge of nose.  Left eye swollen shut.  EOM of the right eye are intact.  MMM Respiratory system: Clear to auscultation. Respiratory effort normal. Cardiovascular system: S1 & S2 heard, RRR. No  JVD, murmurs, rubs, gallops or clicks.  Warm extremities Gastrointestinal system: Abdomen is nondistended, soft and nontender. No organomegaly or masses felt. Normal bowel sounds heard. MSK:  Normal tone and bulk, no lower extremity edema Neuro:  Grossly moves all extremities.  Mild hand tremor.  Psychiatry:  Confused. Repeatedly stated that his hands tingle and are numb.  Speculated that he got his black eye because he rubbed his hand against his eye.  Oriented to person only.      Data Reviewed: I have personally  reviewed following labs and imaging studies  CBC:  Recent Labs Lab 02/13/16 0322 02/13/16 0821 02/14/16 0350  WBC 11.1* 8.0 7.1  NEUTROABS 9.4* 6.5  --   HGB 11.7* 10.4* 10.0*  HCT 35.9* 31.3* 29.9*  MCV 94.5 92.3 94.0  PLT 202 201 [id number]   Basic Metabolic Panel:  Recent Labs Lab 02/13/16 0322 02/13/16 0821 02/14/16 0350  NA 136 139 138  K 5.8* 4.7 3.7  CL 99* 104 105  CO2 22 20* 23  GLUCOSE 605* 355* 191*  BUN 30* 22* 11  CREATININE 1.20 1.10 0.79  CALCIUM 8.8* 8.4* 7.9*   GFR: Estimated Creatinine Clearance: 75.7 mL/min (by C-G formula based on Cr of 0.79). Liver Function Tests:  Recent Labs Lab 02/13/16 0322  AST 53*  ALT 34  ALKPHOS 84  BILITOT 1.2  PROT 7.3  ALBUMIN 4.0   No results for input(s): LIPASE, AMYLASE in the last 168 hours. No results for input(s): AMMONIA in the last 168 hours. Coagulation Profile:  Recent Labs Lab 02/13/16 0821  INR 1.23   Cardiac Enzymes: No results for input(s): CKTOTAL, CKMB, CKMBINDEX, TROPONINI in the last 168 hours. BNP (last 3 results) No results for input(s): PROBNP in the last 8760 hours. HbA1C:  Recent Labs  02/13/16 0821  HGBA1C 8.4*   CBG:  Recent Labs Lab 02/14/16 1953 02/14/16 2352 02/15/16 0351 02/15/16 0748 02/15/16 1117  GLUCAP 263* 145* 94 165* 220*   Lipid Profile: No results for input(s): CHOL, HDL, LDLCALC, TRIG, CHOLHDL, LDLDIRECT in the last 72 hours. Thyroid Function Tests: No results for input(s): TSH, T4TOTAL, FREET4, T3FREE, THYROIDAB in the last 72 hours. Anemia Panel: No results for input(s): VITAMINB12, FOLATE, FERRITIN, TIBC, IRON, RETICCTPCT in the last 72 hours. Urine analysis:    Component Value Date/Time   COLORURINE YELLOW 02/13/2016 [place] 02/13/2016 1045   LABSPEC 1.032* 02/13/2016 1045   PHURINE 5.0 02/13/2016 1045   GLUCOSEU >1000* 02/13/2016 1045   HGBUR NEGATIVE 02/13/2016 [place] 02/13/2016 1045   KETONESUR >80*  02/13/2016 1045   PROTEINUR NEGATIVE 02/13/2016 1045   UROBILINOGEN 0.2 04/19/2015 0210   NITRITE NEGATIVE 02/13/2016 1045   LEUKOCYTESUR NEGATIVE 02/13/2016 1045   Sepsis Labs: @LABRCNTIP (procalcitonin:4,lacticidven:4)  ) Recent Results (from the past 240 hour(s))  Urine culture     Status: Abnormal   Collection Time: 02/13/16 10:45 AM  Result Value Ref Range Status   Specimen Description URINE, RANDOM  Final   Special Requests NONE  Final   Culture MULTIPLE SPECIES PRESENT, SUGGEST RECOLLECTION (A)  Final   Report Status 02/14/2016 FINAL  Final  MRSA PCR Screening     Status: None   Collection Time: 02/13/16  5:05 PM  Result Value Ref Range Status   MRSA by PCR NEGATIVE NEGATIVE Final    Comment:        The GeneXpert MRSA Assay (FDA approved for NASAL specimens only), is one component of a  comprehensive MRSA colonization surveillance program. It is not intended to diagnose MRSA infection nor to guide or monitor treatment for MRSA infections.       Radiology Studies: Ct Head Wo Contrast  02/14/2016  CLINICAL DATA:  Follow-up traumatic brain injury. EXAM: CT HEAD WITHOUT CONTRAST TECHNIQUE: Contiguous axial images were obtained from the base of the skull through the vertex without intravenous contrast. COMPARISON:  02/13/2016 FINDINGS: Multifocal acute intracranial hemorrhage again demonstrated. Acute subdural hemorrhage demonstrated focally along the anterior falx as well as in the left sylvian fissure region. Multifocal areas of intraparenchymal hemorrhage demonstrated predominate along the subcortical or cortical surfaces in the frontal lobes bilaterally along the convexity, and in the left parietal lobe. Mild intraventricular hemorrhage in the posterior horns of the lateral ventricles bilaterally. No significant progression in these areas since the previous study. Subdural Hemorrhage along the right anterior frontal region and subarachnoid hemorrhage along the parafalcine  sulci is less distinct than on the previous study. mild cerebral atrophy. Mild ventricular dilatation is likely due to central atrophy. No mass effect or midline shift. Gray-white matter junctions are distinct. Large subcutaneous scalp hematoma in the left anterior frontal region extending to the temporal region and left side of the face is again demonstrated, slightly smaller than previous study. IMPRESSION: Multifocal acute intracranial hemorrhage, including subdural, subarachnoid, intraparenchymal, intraventricular hemorrhage. No progression since previous study. Some areas are becoming less distinct. Electronically Signed   By: Lucienne Capers M.D.   On: 02/14/2016 05:49     Scheduled Meds: . bacitracin-polymyxin b   Left Eye TID  . docusate sodium  100 mg Oral BID  . dorzolamide-timolol  1 drop Left Eye BID  . escitalopram  20 mg Oral Daily  . insulin aspart  0-20 Units Subcutaneous Q4H  . insulin glargine  24 Units Subcutaneous QHS  . lisinopril  2.5 mg Oral BID  . pantoprazole  40 mg Oral Daily  . polyethylene glycol powder  17 g Oral QHS  . pregabalin  100 mg Oral TID  . primidone  100 mg Oral Q8H  . propranolol ER  60 mg Oral Daily  . rosuvastatin  10 mg Oral Daily   Continuous Infusions:    LOS: 2 days    Time spent: 30 min    Janece Canterbury, MD [facility] Pager [phone]  If 7PM-7AM, please contact night-coverage [email or web address] Password [email or web address] 02/15/2016, 12:50 PM

## 2016-02-15 NOTE — Progress Notes (Signed)
Physical Therapy Treatment Patient Details Name: Dale Morrow MRN: [id number] DOB: Sep 24, 1941 Today's Date: 02/15/2016    History of Present Illness 75 y.o. male with a Past Medical History of left de Quervain's tenosynovitis, resting tremor, prostate cancer, diabetes, Bell's palsy, nonischemic cardiomyopathy with EF of 25%, GERD who presents with syncope with severe TBI and left eye injury    PT Comments    Patient able to negotiate stairs with min A and wife reports able to assist at this level.  She understands need for full time supervision/assist and plans to get sitter for when she returns to work later next week.  RN CM made aware of needs.  Will follow until d/c.  Follow Up Recommendations  Home health PT;Supervision/Assistance - 24 hour     Equipment Recommendations  None recommended by PT    Recommendations for Other Services       Precautions / Restrictions Precautions Precautions: Fall    Mobility  Bed Mobility   Bed Mobility: Sit to Supine       Sit to supine: Supervision   General bed mobility comments: cues for positioning in bed  Transfers   Equipment used: Rolling walker (2 wheeled) Transfers: Sit to/from Stand Sit to Stand: Min guard         General transfer comment: up from 3:1 over toilet assist for safety  Ambulation/Gait Ambulation/Gait assistance: Min guard;Supervision Ambulation Distance (Feet): 200 Feet Assistive device: Rolling walker (2 wheeled) Gait Pattern/deviations: Step-through pattern;Decreased stride length;Trunk flexed;Wide base of support     General Gait Details: assist at time for safety around obstacles, door ways; walker adjusted to pt's height   Stairs Stairs: Yes Stairs assistance: Min assist Stair Management: One rail Right;Forwards;Alternating pattern (and hand hold a) Number of Stairs: 3 General stair comments: cues for technique, assist for safety/balance, wife present and instructed on level of assist for  stairs at home entry; reports has twin bed on main level and hopes pt will sleep there  Wheelchair Mobility    Modified Rankin (Stroke Patients Only)       Balance Overall balance assessment: Needs assistance   Sitting balance-Leahy Scale: Good       Standing balance-Leahy Scale: Fair                      Cognition Arousal/Alertness: Awake/alert Behavior During Therapy: WFL for tasks assessed/performed Overall Cognitive Status: Impaired/Different from baseline       Memory: Decreased short-term memory Following Commands: Follows multi-step commands inconsistently Safety/Judgement: Decreased awareness of deficits;Decreased awareness of safety     General Comments: patient needed firm redirection for safety precautions and to pause prior to getting environment set up; knew he was at [place] but not what room number; did not remember what he just ate for lunch    Exercises      General Comments General comments (skin integrity, edema, etc.): educated wife on fall prevention with home environment set up and concern for decreased depth perception with L eye swollen shut as well as bathroom set up and need for hands on help in shower (reports thinks tub curves too narrow for seat, but has grabbar)      Pertinent Vitals/Pain Faces Pain Scale: Hurts a little bit Pain Location: L leg/thigh Pain Descriptors / Indicators: Sore Pain Intervention(s): Monitored during session;Repositioned    Home Living  Prior Function            PT Goals (current goals can now be found in the care plan section) Progress towards PT goals: Progressing toward goals    Frequency  Min 3X/week    PT Plan      Co-evaluation             End of Session Equipment Utilized During Treatment: Gait belt Activity Tolerance: Patient tolerated treatment well Patient left: in bed;with call bell/phone within reach;with bed alarm set;with family/visitor  present     Time: 1212-1240 PT Time Calculation (min) (ACUTE ONLY): 28 min  Charges:  $Gait Training: 23-37 mins                    G Codes:      Reginia Naas 2016/02/22, 1:24 PM  Magda Kiel, [place] 02/22/2016

## 2016-02-15 NOTE — Progress Notes (Signed)
Patient ID: Dale Morrow, male   DOB: [birth year], 75 y.o.   MRN: [id number]   LOS: 2 days   Subjective: No c/o, doing well, somewhat more confused.   Objective: Vital signs in last 24 hours: Temp:  [98.3 F (36.8 C)-99.1 F (37.3 C)] 98.3 F (36.8 C) (05/04 0921) Pulse Rate:  [54-66] 57 (05/04 0921) Resp:  [15-20] 20 (05/04 0921) BP: (109-130)/(53-62) 120/59 mmHg (05/04 0921) SpO2:  [94 %-100 %] 94 % (05/04 0921) Last BM Date: 02/14/16   Laboratory  CBG (last 3)   Recent Labs  02/14/16 2352 02/15/16 0351 02/15/16 0748  GLUCAP 145* 94 165*    Physical Exam General appearance: alert and no distress Resp: clear to auscultation bilaterally Cardio: regular rate and rhythm GI: normal findings: bowel sounds normal and soft, non-tender   Assessment/Plan: Fall  TBI w/SAH, SDH -- TBI team Left periorbital swelling, laceration s/p repair -- per Dr. Katy Fitch  Multiple medical problems including uncontrolled DM -- IM to manage Acute on chronic anemia -- Stable FEN -- No issues VTE -- SCD's Dispo -- TBI team anticipating home with [facility], will wait for them to clear him for discharge    Lisette Abu, PA-C Pager: [phone] General Trauma PA Pager: [phone]  02/15/2016

## 2016-02-15 NOTE — Progress Notes (Addendum)
Addendum 7PM:  Reviewed patient's pump again and discovered that patient is actually using exchanges to cover his carbohydrates.  He is not counting grams of carbohydrates ([place]: 1 unit for every 1.5 exchange and 10AM: 1 unit for every 3 exchanges).  Dr. Cindra Eves paperwork states that patient is to cover food intake with Carbohydrate Ratio, but after reviewing pump and speaking with patient, patient is using the exchange system.  Did NOT make any changes to insulin pump with wife.  All current settings left the same.  RN notified about CBG check for 10PM and 2am tonight.  Patient to bolus self in AM with breakfast like he normally does at home.    DM Coordinator to call Dr. Cindra Eves office in AM to verify settings.  Wife and patient to resume insulin pump this evening between 7-8 pm tonight and alert RN.    --Will follow patient during hospitalization--  Wyn Quaker RN, MSN, CDE Diabetes Coordinator Inpatient Glycemic Control Team Team Pager: [phone] (8a-5p)

## 2016-02-15 NOTE — Progress Notes (Signed)
Occupational Therapy Treatment Patient Details Name: Dale Morrow MRN: [id number] DOB: 12/26/1940 Today's Date: 02/15/2016    History of present illness 75 y.o. male with a Past Medical History of left de Quervain's tenosynovitis, resting tremor, prostate cancer, diabetes, Bell's palsy, nonischemic cardiomyopathy with EF of 25%, GERD who presents with syncope with severe TBI and left eye injury   OT comments  Pt making progress toward OT goals. Continues to be disoriented to place, time, and situation and following multi step commands inconsistently. Pt currently min guard for standing grooming activities at the sink and min assist for functional mobility with use of RW for support. Pt presenting with short term memory deficits; unable to path find back to room after short distance mobility in hallway. D/c plan remains appropriate. Will continue to follow acutely.   Follow Up Recommendations  Home health OT;Supervision/Assistance - 24 hour    Equipment Recommendations  Tub/shower seat    Recommendations for Other Services      Precautions / Restrictions Precautions Precautions: Fall Restrictions Weight Bearing Restrictions: No       Mobility Bed Mobility Overal bed mobility: Needs Assistance Bed Mobility: Supine to Sit     Supine to sit: Supervision;HOB elevated     General bed mobility comments: Supervision for safety. HOB elevated without use of bed rails. No dizziness reported.  Transfers Overall transfer level: Needs assistance Equipment used: 1 person hand held assist Transfers: Sit to/from Stand Sit to Stand: Min assist         General transfer comment: No assist to boost up from EOB. Min hand held assist to steady in standing. One posterior LOB with initial sit to stand, min assist to correct.    Balance Overall balance assessment: Needs assistance Sitting-balance support: Feet supported;No upper extremity supported Sitting balance-Leahy Scale: Good      Standing balance support: No upper extremity supported;During functional activity Standing balance-Leahy Scale: Fair Standing balance comment: Pt able to stand at sink and complete grooming activities without UE support and close min guard for safety.                   ADL Overall ADL's : Needs assistance/impaired     Grooming: Min guard;Standing;Oral care;Wash/dry face Grooming Details (indicate cue type and reason): Close min guard for safety; no LOB standing at sink.         Upper Body Dressing : Minimal assistance;Sitting Upper Body Dressing Details (indicate cue type and reason): To don hospital gown. Lower Body Dressing: Min guard Lower Body Dressing Details (indicate cue type and reason): Pt able to pull up socks sitting EOB without LOB.             Functional mobility during ADLs: Minimal assistance;Rolling walker General ADL Comments: Pt able to navigate in hallway with min assist and use of RW. Verbal cues given for path finding back to room; pt unable to initally recall room number after ~2 minutes and required verbal and visual cueing to find his room. BP=109/58 following mobility.       Vision                     Perception     Praxis      Cognition   Behavior During Therapy: [place] for tasks assessed/performed Overall Cognitive Status: Impaired/Different from baseline Area of Impairment: Orientation;Memory;Following commands;Problem solving Orientation Level: Disoriented to;Place;Time;Situation (cause of eye injury is "due to meds", "we are in [place]")   Memory: Decreased  short-term memory  Following Commands: Follows one step commands consistently;Follows multi-step commands inconsistently Safety/Judgement: Decreased awareness of safety;Decreased awareness of deficits   Problem Solving: Requires verbal cues;Requires tactile cues      Extremity/Trunk Assessment               Exercises     Shoulder Instructions        General Comments      Pertinent Vitals/ Pain       Pain Assessment: No/denies pain  Home Living                                          Prior Functioning/Environment              Frequency Min 3X/week     Progress Toward Goals  OT Goals(current goals can now be found in the care plan section)  Progress towards OT goals: Progressing toward goals  Acute Rehab OT Goals Patient Stated Goal: to go home OT Goal Formulation: With patient  Plan Discharge plan remains appropriate    Co-evaluation                 End of Session Equipment Utilized During Treatment: Gait belt;Rolling walker   Activity Tolerance Patient tolerated treatment well   Patient Left Other (comment);with nursing/sitter in room;with call bell/phone within reach (sitting EOB with RN)   Nurse Communication          [id number] OT Time Calculation (min): 30 min  Charges: OT General Charges $OT Visit: 1 Procedure OT Treatments $Self Care/Home Management : 8-22 mins $Therapeutic Activity: 8-22 mins  Binnie Kand M.S., OTR/L Pager: [phone]  02/15/2016, 8:59 AM

## 2016-02-15 NOTE — Care Management Note (Signed)
Case Management Note  Patient Details  Name: Dale Morrow MRN: [id number] Date of Birth: 11-24-1940  Subjective/Objective:  Pt admitted on 02/13/16 s/p apparent fall with TBI/SDH.  PTA, pt resided at home with spouse.                    Action/Plan: Met with pt and spouse to discuss dc plans.  Pt with moderate cognitive deficits s/p fall; will need home PT/OT and ST for cognitive therapy at home.  Wife plans to provide 24h care at dc; provided 24h private duty sitter list, per her request.  Wife states she chooses [place] for home care needs, as she has used in the past.  Will need orders for [facility] care prior to dc.  Wife denies any DME needs.    Expected Discharge Date:    02/16/16              Expected Discharge Plan:  [place]  In-House Referral:     Discharge planning Services  CM Consult  Post Acute Care Choice:    Choice offered to:     DME Arranged:    DME Agency:     HH Arranged:    HH Agency:     Status of Service:  In process, will continue to follow  Medicare Important Message Given:    Date Medicare IM Given:    Medicare IM give by:    Date Additional Medicare IM Given:    Additional Medicare Important Message give by:     If discussed at [place] of Stay Meetings, dates discussed:    Additional Comments:  Reinaldo Raddle, RN, BSN  Trauma/Neuro ICU Case Manager [phone]

## 2016-02-15 NOTE — Evaluation (Signed)
Speech Language Pathology Evaluation Patient Details Name: Dale Morrow MRN: [id number] DOB: November 09, 1940 Today's Date: 02/15/2016 Time: [id number] SLP Time Calculation (min) (ACUTE ONLY): 23 min  Problem List:  Patient Active Problem List   Diagnosis Date Noted  . Diabetes mellitus, insulin dependent (IDDM), uncontrolled ([place]) 02/13/2016  . Fall at home/unwitnessed 02/13/2016  . 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 Normocytic anemia 02/13/2016  . Laceration of left eye (traumatic with significant swelling) 02/13/2016  . Diabetes mellitus with complication ([place])   . Intracranial hemorrhage ([place])   . 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 History of Takotsubo Cardiomyopathy- ECHO 05/2015 w/EF 55%   . Type II diabetes mellitus ([place])   . Aspiration pneumonia ([place]) 04/22/2015  . Status epilepticus ([place]) due to hypoglycemia 04/19/2015  . Seizure-like activity ([place])   . Failed total knee arthroplasty ([place]) 01/25/2015  . OA (osteoarthritis) of knee 01/25/2015  . Essential tremor 03/09/2014  . Depression   . Prostate cancer, primary, with metastasis from prostate to other site [facility])   . PONV (postoperative nausea and vomiting)   . De Quervain's tenosynovitis, left 04/13/2012   Past Medical History:  Past Medical History  Diagnosis Date  . Arthritis     left knee  . Depression   . De Quervain's tenosynovitis, left 04/2012  . Tremors of nervous system     takes propranolol for tremors  . Prostate cancer, primary, with metastasis from prostate to other site [facility])     a. metastasis to lymph nodes;  b. s/p XRT.  [name] Complication of anesthesia     pt denies ponv, pt woke up during colonscopy in past  . Diabetes mellitus     Insulin pump, takes lisinopril for kidneys, dr Buddy Duty endocrinologist  . Left-sided Bell's palsy   . Right-sided Bell's palsy   . Nonischemic cardiomyopathy ([place])     a. 04/2015 Echo: EF 25-30%, mid apical, antseptal,  anterolat, inflat, infsept, apical AK. ? takotsubo. No thrombus. PASP 63mmHg;  b. 04/2014 Lexi MV: EF 29%, fixed mod size and intensity mid-dist ant, apical, inferior scar w/ wma's;  c. 04/2015 Cath: LM nl, LAD 50p/m, LCX 25p, RI nl, RCA nl, RPAV 50d-->Med Rx.  . Chronic systolic CHF (congestive heart failure) ([place])     a. 04/2015 Echo: EF 25-30%.  [name] GERD (gastroesophageal reflux disease)    Past Surgical History:  Past Surgical History  Procedure Laterality Date  . Hemorrhoid surgery  1970s  . Anterior cruciate ligament repair      right knee  . Wrist arthroscopy  05/03/2010    right; and release of 1st dorsal compartment  . Knee arthrotomy  03/18/2008    left; with scar exc.  [name] Knee arthroscopy  09/25/2007; 04/15/2005    left  . Knee joint manipulation  06/29/2007    left  . Total knee arthroplasty  04/29/2007    left  . Dorsal compartment release  04/21/2012    Procedure: RELEASE DORSAL COMPARTMENT (DEQUERVAIN);  Surgeon: Cammie Sickle., MD;  Location: [facility];  Service: Orthopedics;  Laterality: Left;  left 1st dorsal compartment release left wrist   . Minor amputation of digit Left 07/21/2013    Procedure: LEFT SMALL REVISION AMPUTATION (MINOR PROCEDURE) ;  Surgeon: Schuyler Amor, MD;  Location: [place];  Service: Orthopedics;  Laterality: Left;  . Colonoscopy with propofol N/A 04/07/2014    Procedure: COLONOSCOPY WITH PROPOFOL;  Surgeon:  Jeryl Columbia, MD;  Location: [facility];  Service: Endoscopy;  Laterality: N/A;  . Total knee revision Left 01/25/2015    Procedure: LEFT TOTAL KNEE  ARTHOPLASTY REVISION;  Surgeon: Gaynelle Arabian, MD;  Location: [facility];  Service: Orthopedics;  Laterality: Left;  . Cardiac catheterization N/A 04/26/2015    Procedure: Left Heart Cath and Coronary Angiography;  Surgeon: Jettie Booze, MD;  Location: [place] [facility];  Service: Cardiovascular;  Laterality: N/A;  . Cardiac catheterization  04/26/2015     Procedure: Intravascular Ultrasound/IVUS;  Surgeon: Jettie Booze, MD;  Location: [place] [facility];  Service: Cardiovascular;;  . Carpal tunnel release Left 07/05/2015    Procedure: LEFT CARPAL TUNNEL RELEASE;  Surgeon: Charlotte Crumb, MD;  Location: [place];  Service: Orthopedics;  Laterality: Left;  . Ulnar tunnel release Left 07/05/2015    Procedure: LEFT CUBITAL TUNNEL RELEASE;  Surgeon: Charlotte Crumb, MD;  Location: [place];  Service: Orthopedics;  Laterality: Left;   HPI:  75 y.o. male with a Past Medical History of left de Quervain's tenosynovitis, resting tremor, prostate cancer, diabetes, Bell's palsy, nonischemic cardiomyopathy with EF of 25%, GERD who presents with syncope with TBI and left eye injury.  CCTMultiple areas of contusion and SAH/ Right frontoparietal SDH.     Assessment / Plan / Recommendation Clinical Impression  Pt presents with moderate cognitive deficits s/p TBI (RL VI) marked by poor short-term, working and prospective memory; limited insight into condition; disorientation to time, place, and situation (stated he was in [place], that it was 2002, and could not describe reason for hospitalization).  Pt will require 24 hour supervision and f/u SLP services.  Reviewed results of cognitive testing with pt/wife; wife cannot provide 24 hour supervision (she works evenings in radiology in [employer] system) but is looking into sitter help.     SLP Assessment  Patient needs continued Speech Lanaguage Pathology Services    Follow Up Recommendations  Home health SLP    Frequency and Duration min 2x/week  1 week      SLP Evaluation Prior Functioning  Cognitive/Linguistic Baseline: Within functional limits Type of Home: House  Lives With: Spouse Available Help at Discharge: Family Vocation: Retired   Associate Professor  Overall Cognitive Status: Impaired/Different from baseline Arousal/Alertness: Awake/alert Orientation  Level: Oriented to person;Disoriented to situation;Disoriented to place;Disoriented to time Attention: Selective Selective Attention: Appears intact Memory: Impaired Memory Impairment: Storage deficit;Retrieval deficit;Decreased short term memory Decreased Short Term Memory: Verbal basic;Functional basic Awareness: Impaired Awareness Impairment: Intellectual impairment Problem Solving: Impaired Problem Solving Impairment: Verbal basic;Functional basic Executive Function: Self Monitoring;Decision Making Decision Making: Impaired Decision Making Impairment: Verbal basic Self Monitoring: Impaired Safety/Judgment: Impaired [employer] Scales of Cognitive Functioning: Confused/appropriate    Comprehension  Auditory Comprehension Overall Auditory Comprehension: Appears within functional limits for tasks assessed Reading Comprehension Reading Status: Not tested    Expression Expression Primary Mode of Expression: Verbal Verbal Expression Overall Verbal Expression: Appears within functional limits for tasks assessed Written Expression Dominant Hand: Right Written Expression: Not tested   Oral / Motor  Oral Motor/Sensory Function Overall Oral Motor/Sensory Function: Within functional limits Motor Speech Overall Motor Speech: Appears within functional limits for tasks assessed   GO                   B[place] L. Tivis Ringer, [place] CCC/SLP Pager [phone]  Juan Quam Laurice 02/15/2016, 3:25 PM

## 2016-02-15 NOTE — Progress Notes (Signed)
Inpatient Diabetes Program Recommendations  AACE/ADA: New Consensus Statement on Inpatient Glycemic Control (2015)  Target Ranges:  Prepandial:   less than 140 mg/dL      Peak postprandial:   less than 180 mg/dL (1-2 hours)      Critically ill patients:  140 - 180 mg/dL   Results for JORDAN, CRISMON (MRN [id number]) as of 02/15/2016 17:34  Ref. Range 02/14/2016 23:52 02/15/2016 03:51 02/15/2016 07:48 02/15/2016 11:17 02/15/2016 16:10  Glucose-Capillary Latest Ref Range: 65-99 mg/dL 145 (H) 94 165 (H) 220 (H) 241 (H)    Home DM Meds: Insulin Pump  Current Insulin Orders: Lantus 24 units QHS      Novolog Moderate Correction Scale/ SSI (0-15 units) TID AC + HS      Novolog 3 units tidwc    -Asked by Dr. Sheran Fava to assess patient's ability to use his insulin pump.  Patient's left eye is swollen shut but pt can see out of his right eye.  Was able to demonstrate to me with success that he can correctly enter blood sugar data and carbohydrate data into his insulin pump to bolus himself.  Wife is comfortable helping patient get his insulin pump set up with new supplies (new tubing, new reservoir, new insulin, new insertion site, etc).  Asked wife to please verify all data that is entered into patient's insulin pump (CBG/ Grams of Carbohydrate) and allow pt to bolus himself with his pump.  Patient and wife comfortable with this plan.    -I am concerned about patient's insulin pump settings.  Per Insulin pump instructions from Dr. Cindra Eves office, the pump should be set with the following settings:  Basal Rates: 12AM- 1.00 units/hr 3AM- 1.20 units/hr 6AM- 0.800 units/hr 5PM- 0.800 units/hr  Total Basal Insulin per 24 hours period= 21 units  Carbohydrate Ratio:  MN: 1 unit for every 10 Grams of Carbohydrates 10AM: 1 unit for every 5 Grams of Carbohydrates  Correction/Sensitivity Factor:  MN: 1 unit for every 20 mg/dl above Target CBG 11AM: 1 unit for every 30 mg/dl above Target CBG  Target CBG: 110  mg/dl   When I assessed patient's current Insulin pump settings, the basal rates and the Correction/Sensitivity match those settings that were on the instructions from Dr. Cindra Eves office, however, the Carbohydrate Ratio was vastly different on the pump versus the paperwork from Dr. Cindra Eves office.  Patient's insulin pump is currently set at MN: 1 unit for every 1.5 grams carbohydrates and 11AM: 1 unit for every 3 grams carbohydrates (paperwork states Carbohydrate ratio should be MN: 1 unit for every 10 grams carbohydrates and 10 AM: 1 unit for every 5 grams carbohydrates.  Spoke with Dr. Sheran Fava about these differences.  Discussed with Dr. Sheran Fava that I can help patient's wife adjust pump Carbohydrate ratio to match the settings from Dr. Cindra Eves paperwork and that we can follow up with Dr. Cindra Eves office 1st thing in the AM to verify what the Carbohydrate Ratio should be.  Plan (after discussion with Dr. Sheran Fava) will be to have patient and wife restart insulin pump tonight with all new supplies between 7-8 pm tonight.  I will assist wife to change Carbohydrate ratio to MN: 1 unit for every 10 grams carbohydrates and 10 AM: 1 unit for every 5 grams carbohydrates and then the DM Coordinator can call Dr. Cindra Eves office in the AM to verify all pump settings.  Discussed with patient and wife these plans an they are OK with these plans as well.     --  Will follow patient during hospitalization--  Wyn Quaker RN, MSN, CDE Diabetes Coordinator Inpatient Glycemic Control Team Team Pager: [phone] (8a-5p)

## 2016-02-16 DIAGNOSIS — E1065 Type 1 diabetes mellitus with hyperglycemia: Secondary | ICD-10-CM

## 2016-02-16 DIAGNOSIS — G250 Essential tremor: Secondary | ICD-10-CM

## 2016-02-16 DIAGNOSIS — I429 Cardiomyopathy, unspecified: Secondary | ICD-10-CM

## 2016-02-16 DIAGNOSIS — D649 Anemia, unspecified: Secondary | ICD-10-CM

## 2016-02-16 DIAGNOSIS — W19XXXD Unspecified fall, subsequent encounter: Secondary | ICD-10-CM

## 2016-02-16 LAB — GLUCOSE, CAPILLARY
GLUCOSE-CAPILLARY: 214 mg/dL — AB (ref 65–99)
GLUCOSE-CAPILLARY: 141 mg/dL — AB (ref 65–99)
GLUCOSE-CAPILLARY: 87 mg/dL (ref 65–99)
Glucose-Capillary: 238 mg/dL — ABNORMAL HIGH (ref 65–99)

## 2016-02-16 LAB — CBC
HCT: 32.3 % — ABNORMAL LOW (ref 39.0–52.0)
HEMOGLOBIN: 10.9 g/dL — AB (ref 13.0–17.0)
MCH: 30.8 pg (ref 26.0–34.0)
MCHC: 33.7 g/dL (ref 30.0–36.0)
MCV: 91.2 fL (ref 78.0–100.0)
Platelets: 208 10*3/uL (ref 150–400)
RBC: 3.54 MIL/uL — AB (ref 4.22–5.81)
RDW: 13.7 % (ref 11.5–15.5)
WBC: 6.8 10*3/uL (ref 4.0–10.5)

## 2016-02-16 LAB — BASIC METABOLIC PANEL
Anion gap: 9 (ref 5–15)
BUN: 11 mg/dL (ref 6–20)
CHLORIDE: 103 mmol/L (ref 101–111)
CO2: 26 mmol/L (ref 22–32)
CREATININE: 0.65 mg/dL (ref 0.61–1.24)
Calcium: 8.7 mg/dL — ABNORMAL LOW (ref 8.9–10.3)
GFR calc non Af Amer: >60 mL/min (ref >60)
Glucose, Bld: 100 mg/dL — ABNORMAL HIGH (ref 65–99)
Potassium: 3.8 mmol/L (ref 3.5–5.1)
SODIUM: 138 mmol/L (ref 135–145)

## 2016-02-16 LAB — URINALYSIS, ROUTINE W REFLEX MICROSCOPIC
BILIRUBIN URINE: NEGATIVE
GLUCOSE, UA: 250 mg/dL — AB
HGB URINE DIPSTICK: NEGATIVE
KETONES UR: 15 mg/dL — AB
Leukocytes, UA: NEGATIVE
Nitrite: NEGATIVE
PH: 7 (ref 5.0–8.0)
PROTEIN: NEGATIVE mg/dL
Specific Gravity, Urine: 1.024 (ref 1.005–1.030)

## 2016-02-16 MED ORDER — INSULIN LISPRO 100 UNIT/ML (KWIKPEN): 0.0000 [IU] | PEN_INJECTOR | Freq: Three times a day (TID) | SUBCUTANEOUS | Status: DC

## 2016-02-16 MED ORDER — FAMOTIDINE 20 MG PO TABS: 20.0000 mg | ORAL_TABLET | Freq: Two times a day (BID) | ORAL | Status: DC

## 2016-02-16 MED ORDER — INSULIN ASPART 100 UNIT/ML ~~LOC~~ SOLN: 4.0000 [IU] | Freq: Three times a day (TID) | SUBCUTANEOUS | Status: DC

## 2016-02-16 MED ORDER — INSULIN GLARGINE 100 UNIT/ML ~~LOC~~ SOLN
21.0000 [IU] | Freq: Every day | SUBCUTANEOUS | Status: DC
  Administered 2016-02-16: 21 [IU] via SUBCUTANEOUS
  Filled 2016-02-16: qty 0.21

## 2016-02-16 MED ORDER — DORZOLAMIDE HCL-TIMOLOL MAL 2-0.5 % OP SOLN: 1.0000 [drp] | Freq: Two times a day (BID) | OPHTHALMIC | Status: DC

## 2016-02-16 MED ORDER — ONDANSETRON HCL 4 MG PO TABS: 4.0000 mg | ORAL_TABLET | Freq: Three times a day (TID) | ORAL | Status: DC | PRN

## 2016-02-16 MED ORDER — ROSUVASTATIN CALCIUM 10 MG PO TABS: 10.0000 mg | ORAL_TABLET | Freq: Every day | ORAL | Status: DC

## 2016-02-16 MED ORDER — INSULIN ASPART 100 UNIT/ML ~~LOC~~ SOLN
0.0000 [IU] | Freq: Three times a day (TID) | SUBCUTANEOUS | Status: DC
Start: 2016-02-16 — End: 2016-02-16
  Administered 2016-02-16: 3 [IU] via SUBCUTANEOUS

## 2016-02-16 MED ORDER — INSULIN GLARGINE 100 UNIT/ML SOLOSTAR PEN: 21.0000 [IU] | PEN_INJECTOR | Freq: Every day | SUBCUTANEOUS | Status: DC

## 2016-02-16 MED FILL — HUMALOG 100 UNITS/ML KWIKPE: 100 | 45 days supply | Qty: 15 | Fill #0

## 2016-02-16 MED FILL — ONDANSETRON HCL 4 MG TABLET: 4 | 7 days supply | Qty: 20 | Fill #0

## 2016-02-16 MED FILL — UNIFINE PENTIPS 32GX5/32: 32G X 4 MM | 25 days supply | Qty: 100 | Fill #0

## 2016-02-16 MED FILL — LANTUS SOLOSTAR 100 UNITS/M: 100 | 71 days supply | Qty: 15 | Fill #0

## 2016-02-16 MED FILL — DORZOLAMIDE-TIMOLOL EYE DRP: 22.3-6.8 | 90 days supply | Qty: 10 | Fill #0

## 2016-02-16 NOTE — Progress Notes (Signed)
Patient progressing well. No significant headache. Confusion improving. Ambulating with minimal difficulty.  Status post diffuse traumatic brain injury with some scattered traumatic subarachnoid hemorrhage. Mobilize as tolerated. No need for additional imaging. Follow-up with me as an outpatient.

## 2016-02-16 NOTE — Progress Notes (Signed)
Patient is being dc. Dc instructions given to patient and wife. Both verbalized understanding.

## 2016-02-16 NOTE — Discharge Instructions (Signed)
DIABETES  Check a blood sugar before breakfast, lunch, and dinner and bed.  If his blood sugar is less than 70, administer the hypoglycemia protocol.     1.  Give lantus 21 units daily.    2.  Administer 4 units of humalog (aspart) prior to his meals to cover his carbohydrates.   3.  Administer sliding scale insulin four times a day based on his blood sugar.   Humalog 0-7 units correction scale:  120-150 mg/dl1 unit 151-200 mg/dl2 units 201-250 mg/dl3 units 251-300 mg/dl4 units 301-350 mg/dl5 units 350-400 mg/dl6 units >401 mg/dl7 units and call Dr. Cindra Eves office.    Call Dr. Cindra Eves office if you have any questions or concerns.    Wash wounds daily in shower with soap and water. Do not soak. Apply antibiotic ointment (e.g. Neosporin) twice daily and as needed to keep moist. Cover with dry dressing if desired.

## 2016-02-16 NOTE — Progress Notes (Signed)
Patient ID: Dale Morrow, male   DOB: [birth year], 75 y.o.   MRN: [id number]   LOS: 3 days   Subjective: Not hungry this morning but no new c/o. Appreciate DM coordinator's note. Wife concerned that he seems a little worse today, didn't recognize he had wet the bed.   Objective: Vital signs in last 24 hours: Temp:  [97.7 F (36.5 C)-99.8 F (37.7 C)] 98.2 F (36.8 C) (05/05 0923) Pulse Rate:  [53-59] 53 (05/05 0923) Resp:  [18-20] 20 (05/05 0923) BP: (114-153)/(57-69) 144/62 mmHg (05/05 0923) SpO2:  [96 %-100 %] 97 % (05/05 0923) Last BM Date: 02/14/16   Laboratory Results CBG (last 3)   Recent Labs  02/15/16 2115 02/16/16 0637 02/16/16 0805  GLUCAP 242* 214* 141*    Physical Exam General appearance: alert and no distress Resp: clear to auscultation bilaterally Cardio: regular rate and rhythm GI: normal findings: bowel sounds normal and soft, non-tender A&Ox2 (gave age instead of year, got age wrong)   Assessment/Plan: Fall  TBI w/SAH, SDH -- TBI team Left periorbital swelling, laceration s/p repair -- per Dr. Katy Fitch  Multiple medical problems including uncontrolled DM -- IM to manage Acute on chronic anemia -- Stable FEN -- No issues VTE -- SCD's Dispo -- Anticipate home this afternoon, will see what DM coordinator and PT/OT say.    Lisette Abu, PA-C Pager: [phone] General Trauma PA Pager: [phone]  02/16/2016

## 2016-02-16 NOTE — Discharge Summary (Signed)
Physician Discharge Summary  Patient ID: Dale Morrow MRN: [id number] DOB/AGE: Mar 30, 1941 75 y.o.  Admit date: 02/13/2016 Discharge date: 02/16/2016  Discharge Diagnoses Patient Active Problem List   Diagnosis Date Noted  . Diabetes mellitus, insulin dependent (IDDM), uncontrolled ([place]) 02/13/2016  . Fall at home/unwitnessed 02/13/2016  . 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 Normocytic anemia 02/13/2016  . Laceration of left eye (traumatic with significant swelling) 02/13/2016  . Diabetes mellitus with complication ([place])   . Intracranial hemorrhage ([place])   . 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 History of Takotsubo Cardiomyopathy- ECHO 05/2015 w/EF 55%   . Type II diabetes mellitus ([place])   . Aspiration pneumonia ([place]) 04/22/2015  . Status epilepticus ([place]) due to hypoglycemia 04/19/2015  . Seizure-like activity ([place])   . Failed total knee arthroplasty ([place]) 01/25/2015  . OA (osteoarthritis) of knee 01/25/2015  . Essential tremor 03/09/2014  . Depression   . Prostate cancer, primary, with metastasis from prostate to other site [facility])   . PONV (postoperative nausea and vomiting)   . De Quervain's tenosynovitis, left 04/13/2012    Consultants Dr. Granville Lewis for neurosurgery  Dr. Linna Darner for internal medicine  Dr. Wyatt Portela for ophthalmology   Procedures 5/2 -- Repair of left periorbital laceration by Dr. Katy Fitch   HPI: Shigeru was home alone and was in bed when his wife came home. She noticed some blood on the stair landing and then saw his bloody and swollen face. He was amnestic to the events of the night. His wife woke him up and he went to the bathroom but was unsteady. He then got weak and slid to the floor and she called EMS. Of note his CBG was over 600. His workup included CT scans of the head, face, and cervical spine and showed the TBI. Neurosurgery was consulted. Ophthalmology was also consulted for the periorbital  soft tissue swelling and laceration as well as internal medicine to manage his medical problems, foremost of which was his uncontrolled diabetes.   Hospital Course: Opthalmology repaired the patient's laceration and advised outpatient follow-up once his swelling had decreased. Neurosurgery recommended non-operative treatment of his brain injury. Internal medicine helped manage his other problems and quickly got his blood sugar under control. A follow-up head CT then next morning was improved. He was mobilized with the traumatic brain injury therapy team who recommended home with home health. He maintained some confusion but his wife felt like she could manage that and that it might improve in a familiar environment. Internal medicine decided to discontinue his insulin pump because of his confusion and due to the wife's discomfort with managing the device. On the day of discharge he had some nausea and vomiting but a limited workup was negative. It resolved as the day went on and he tolerated lunch and was asymptomatic later in the day. He was discharged home in good condition.     Medication List    STOP taking these medications        aspirin EC 81 MG tablet     HUMALOG 100 UNIT/ML injection  Generic drug:  insulin lispro  Replaced by:  insulin lispro 100 UNIT/ML KiwkPen     insulin regular 1 Units/mL in sodium chloride 0.9 % 250 mL      TAKE these medications        CALCIUM-VITAMIN D PO  Take 1 tablet by mouth daily at 12 noon.  dorzolamide-timolol 22.3-6.8 MG/ML ophthalmic solution  Commonly known as:  COSOPT  Place 1 drop into the left eye 2 (two) times daily.     escitalopram 20 MG tablet  Commonly known as:  LEXAPRO  Take 20 mg by mouth daily.     Insulin Glargine 100 UNIT/ML Solostar Pen  Commonly known as:  LANTUS  Inject 21 Units into the skin daily.     insulin lispro 100 UNIT/ML KiwkPen  Commonly known as:  HUMALOG KWIKPEN  Inject 0-0.11 mLs (0-11 Units total)  into the skin 3 (three) times daily. Per sliding scale     lisinopril 2.5 MG tablet  Commonly known as:  PRINIVIL,ZESTRIL  TAKE 1 TABLET BY MOUTH TWICE DAILY     ondansetron 4 MG tablet  Commonly known as:  ZOFRAN  Take 1 tablet (4 mg total) by mouth every 8 (eight) hours as needed for nausea or vomiting.     ONE-A-DAY MENS 50+ ADVANTAGE PO  Take 1 tablet by mouth daily at 12 noon.     polyethylene glycol powder powder  Commonly known as:  GLYCOLAX/MIRALAX  Take 17 g by mouth at bedtime.     pregabalin 100 MG capsule  Commonly known as:  LYRICA  Take 1 capsule (100 mg total) by mouth 3 (three) times daily.     primidone 50 MG tablet  Commonly known as:  MYSOLINE  TAKE 2 TABLETS BY MOUTH 3 TIMES DAILY.     propranolol ER 60 MG 24 hr capsule  Commonly known as:  INDERAL LA  Take 1 capsule (60 mg total) by mouth daily.     ranitidine 150 MG tablet  Commonly known as:  ZANTAC  Take 150 mg by mouth 2 (two) times daily.     rosuvastatin 10 MG tablet  Commonly known as:  CRESTOR  Take 1 tablet (10 mg total) by mouth daily.            Follow-up Information    Follow up with WEBB, CAROL D, MD. Schedule an appointment as soon as possible for a visit in 2 weeks.   Specialty:  Family Medicine   Contact information:   [place] [address] [phone]       Follow up with KERR,JEFFREY, MD. Schedule an appointment as soon as possible for a visit in 1 week.   Specialty:  Endocrinology   Contact information:   [address] [place] [address] [phone]       Follow up with Josephina Gip, MD On 02/19/2016.   Specialty:  Ophthalmology   Contact information:   [place] [address] [place] [address] [phone]       Call [place].   Why:  As needed   Contact information:   [id number] [place]       Schedule an appointment as soon as possible for a visit with Charlie Pitter, MD.   Specialty:  Neurosurgery   Contact information:   [address] [place] [address] [place] [address] [phone]      Discharge planning took greater than 30 minutes.    Signed: Lisette Abu, PA-C Pager: [phone] General Trauma PA Pager: [phone] 02/16/2016, 4:13 PM

## 2016-02-16 NOTE — Care Management Note (Signed)
Case Management Note  Patient Details  Name: Dale Morrow MRN: [id number] Date of Birth: [birth year]  Subjective/Objective: Pt medically stable for dc home today.  Wife to provide 24hr care at home.                     Action/Plan: Referral to [facility], per pt/wife choice.  Start of care 24-48h post dc date.    Expected Discharge Date:   02/16/16               Expected Discharge Plan:  [place]  In-House Referral:     Discharge planning Services  CM Consult  Post Acute Care Choice:    Choice offered to:     DME Arranged:    DME Agency:     HH Arranged:  RN, PT, OT, Speech Therapy HH Agency:  [facility]  Status of Service:  Completed, signed off  Medicare Important Message Given:    Date Medicare IM Given:    Medicare IM give by:    Date Additional Medicare IM Given:    Additional Medicare Important Message give by:     If discussed at [place] of Stay Meetings, dates discussed:    Additional Comments:  Reinaldo Raddle, RN, BSN  Trauma/Neuro ICU Case Manager [phone]

## 2016-02-16 NOTE — Progress Notes (Addendum)
PROGRESS NOTE  Dale Morrow  [id number] DOB: [birth year] DOA: 02/13/2016 PCP: Jonathon Bellows, MD Outpatient Specialists:  Dr. Buddy Duty, Endocrinology  Brief Narrative:  75 y.o. male with a hx of L de Quervain's tenosynovitis, resting tremor, prostate cancer, diabetes, Bell's palsy, nonischemic cardiomyopathy with EF of 25%, and GERD who presented to the ED following a syncopal episode with severe TBI and left eye injury. Patient was admitted by Trauma Service. Ophthalmology and Neurosurgery have been following. [facility] was consulted for management of his chronic medical conditions.  Assessment & Plan:  TBI w/ multiple areas of SAH, SDH plus bilateral IVH.  Neurosurgery managing.  Last head CT on 5/3 demonstrated resolving subdural, intraparenchymal, and intraventricular hemorrhage.  No further imaging her neurosurgery's last note.  Left eye injury with laceration s/p simple closure with 5-0 vicryl sutures of his left eye lacteration.  Ophthalmology recommended antibiotic ointment TID and dorzolamide-timolol drops BID (left eye only). -  Follow up with Dr. Rod Holler Groat in 5 days for dilated exam  DM type 2, A1c 8.4 on 02/13/2016 -  D/c insulin pump -  Greatly appreciate diabetic educators' assistance yesterday and today!   -  Start lantus 21 units qAM -  Aspart 4 units AC + custom dose SSI  Increased confusion today plus elevated temperature  -  No evidence of pneumonia on ROS or exam -  Repeat CBC, BMP and UA with culture  Unwitnessed syncopal spell, patient does not recall event, suspect concussion.  May have been orthostatic due to hyperglycemia probably due to pump (or site) malfunction.  Mild dehydration was suggested by improvement in creatinine and decline in hemoglobin with IVF.   -  Tele:  NSR, sinus bradycardia -  No worrisome findings on TTE  -  No witnessed seizure activity  History of Takotsubo Cardiomyopathy Followed by [facility] - recovered as of Aug 2016 w/ EF 60-65%  - TTE essentially normal this admit   Nonobstructive CAD Per cardiac catheterization July 2016  Normocytic anemia, hemoglobin stable between 9 and 11 mg/dl  Essential tremor / Chronic Facial paresthesia / cervical degeneration with bilateral hand paresthesias -  Followed as outpatient by Neurology  DVT prophylaxis:  None Code Status:  full Family Communication:  Patient and his wife who was at bedside Disposition Plan:  Likely to home with [facility] services, however, I think that given his confusion and need for 24 hour supervision, SNF for a few days may also be reasonable.  I'm concerned about his increased confusion and vomiting.  Would probably wait until we are sure his vomiting is improving before discharge, maybe tomorrow.    Subjective:  Denies pain, states he feels fine.  Left eye is feeling better.  Denies SOB, nausea, vomiting, cough, abdominal pain, focal numbness or weakness.  RN states, however, that he vomited this morning.    Objective: Filed Vitals:   02/15/16 2116 02/16/16 0111 02/16/16 0513 02/16/16 0923  BP: 153/63 118/57 152/69 144/62  Pulse: 59 56 54 53  Temp: 98.6 F (37 C) 97.8 F (36.6 C) 97.7 F (36.5 C) 98.2 F (36.8 C)  TempSrc: Oral Oral Oral Oral  Resp: 20 18 18 20   Height:      Weight:      SpO2: 98% 96% 98% 97%    Intake/Output Summary (Last 24 hours) at 02/16/16 1034 Last data filed at 02/15/16 1210  Gross per 24 hour  Intake    120 ml  Output  0 ml  Net    120 ml   Filed Weights   02/13/16 1635  Weight: 77.111 kg (170 lb)    Examination:  General exam:  Adult male.  No acute distress.  HEENT:  Two black eyes with ecchymoses across bridge of nose.  Left eye swollen shut but less swollen than before.  EOM of the right eye are intact.  MMM Respiratory system: Clear to auscultation. Respiratory effort normal. Cardiovascular system: S1 & S2 heard, RRR. No JVD, murmurs, rubs, gallops or clicks.  Warm extremities Gastrointestinal system:  Abdomen is nondistended, soft and nontender. No organomegaly or masses felt. Normal bowel sounds heard. MSK:  Normal tone and bulk, no lower extremity edema Neuro:  Grossly moves all extremities.  Mild hand tremor.  Psychiatry:  Confused.  Oriented to person only.      Data Reviewed: I have personally reviewed following labs and imaging studies  CBC:  Recent Labs Lab 02/13/16 0322 02/13/16 0821 02/14/16 0350  WBC 11.1* 8.0 7.1  NEUTROABS 9.4* 6.5  --   HGB 11.7* 10.4* 10.0*  HCT 35.9* 31.3* 29.9*  MCV 94.5 92.3 94.0  PLT 202 201 [id number]   Basic Metabolic Panel:  Recent Labs Lab 02/13/16 0322 02/13/16 0821 02/14/16 0350  NA 136 139 138  K 5.8* 4.7 3.7  CL 99* 104 105  CO2 22 20* 23  GLUCOSE 605* 355* 191*  BUN 30* 22* 11  CREATININE 1.20 1.10 0.79  CALCIUM 8.8* 8.4* 7.9*   GFR: Estimated Creatinine Clearance: 75.7 mL/min (by C-G formula based on Cr of 0.79). Liver Function Tests:  Recent Labs Lab 02/13/16 0322  AST 53*  ALT 34  ALKPHOS 84  BILITOT 1.2  PROT 7.3  ALBUMIN 4.0   No results for input(s): LIPASE, AMYLASE in the last 168 hours. No results for input(s): AMMONIA in the last 168 hours. Coagulation Profile:  Recent Labs Lab 02/13/16 0821  INR 1.23   Cardiac Enzymes: No results for input(s): CKTOTAL, CKMB, CKMBINDEX, TROPONINI in the last 168 hours. BNP (last 3 results) No results for input(s): PROBNP in the last 8760 hours. HbA1C: No results for input(s): HGBA1C in the last 72 hours. CBG:  Recent Labs Lab 02/15/16 1117 02/15/16 1610 02/15/16 2115 02/16/16 0637 02/16/16 0805  GLUCAP 220* 241* 242* 214* 141*   Lipid Profile: No results for input(s): CHOL, HDL, LDLCALC, TRIG, CHOLHDL, LDLDIRECT in the last 72 hours. Thyroid Function Tests: No results for input(s): TSH, T4TOTAL, FREET4, T3FREE, THYROIDAB in the last 72 hours. Anemia Panel: No results for input(s): VITAMINB12, FOLATE, FERRITIN, TIBC, IRON, RETICCTPCT in the last 72  hours. Urine analysis:    Component Value Date/Time   COLORURINE YELLOW 02/13/2016 [place] 02/13/2016 1045   LABSPEC 1.032* 02/13/2016 1045   PHURINE 5.0 02/13/2016 1045   GLUCOSEU >1000* 02/13/2016 1045   HGBUR NEGATIVE 02/13/2016 [place] 02/13/2016 1045   KETONESUR >80* 02/13/2016 1045   PROTEINUR NEGATIVE 02/13/2016 1045   UROBILINOGEN 0.2 04/19/2015 0210   NITRITE NEGATIVE 02/13/2016 1045   LEUKOCYTESUR NEGATIVE 02/13/2016 1045   Sepsis Labs: @LABRCNTIP (procalcitonin:4,lacticidven:4)  ) Recent Results (from the past 240 hour(s))  Urine culture     Status: Abnormal   Collection Time: 02/13/16 10:45 AM  Result Value Ref Range Status   Specimen Description URINE, RANDOM  Final   Special Requests NONE  Final   Culture MULTIPLE SPECIES PRESENT, SUGGEST RECOLLECTION (A)  Final   Report Status 02/14/2016 FINAL  Final  MRSA PCR Screening     Status: None   Collection Time: 02/13/16  5:05 PM  Result Value Ref Range Status   MRSA by PCR NEGATIVE NEGATIVE Final    Comment:        The GeneXpert MRSA Assay (FDA approved for NASAL specimens only), is one component of a comprehensive MRSA colonization surveillance program. It is not intended to diagnose MRSA infection nor to guide or monitor treatment for MRSA infections.       Radiology Studies: No results found.   Scheduled Meds: . bacitracin-polymyxin b   Left Eye TID  . docusate sodium  100 mg Oral BID  . dorzolamide-timolol  1 drop Left Eye BID  . escitalopram  20 mg Oral Daily  . [START ON 02/17/2016] famotidine  20 mg Oral BID  . insulin aspart  0-7 Units Subcutaneous TID WC  . insulin aspart  4 Units Subcutaneous TID WC  . insulin glargine  21 Units Subcutaneous Daily  . lisinopril  2.5 mg Oral BID  . polyethylene glycol  17 g Oral QHS  . pregabalin  100 mg Oral TID  . primidone  100 mg Oral Q8H  . propranolol ER  60 mg Oral Daily  . rosuvastatin  10 mg Oral Daily    Continuous Infusions:    LOS: 3 days    Time spent: 30 min    Janece Canterbury, MD [facility] Pager [phone]  If 7PM-7AM, please contact night-coverage [email or web address] Password [email or web address] 02/16/2016, 10:34 AM

## 2016-02-16 NOTE — Progress Notes (Addendum)
Went to check on patient this morning regarding glycemic control and insulin pump. Patient has [employer] insulin pump attached and according to the pump his last insulin bolus was this morning for correction of glucose of 214 mg/dl. Glucose 141 mg/dl at 8:05 am today. In looking at the insulin pump screen there is a message to "[place]" as if the insulin reservoir needed to be changed. Asked patient when infusion set and insulin pump reservoir was changed out and patient stated he did not know. Asked if patient done a bolus this morning for his glucose of 214 mg/dl and again he did not remember if he did or not.  Disconnected insulin pump from patient and looked at the insulin pump. Pressed the ACT button as indicated by the pump to rewind reservoir and then pump prompted to press the ACT button again to fill tubing. When ACT button was pressed it filled the insulin pump tubing (which was obviously not filled). In looking at glycemic trend patient likely received bolus for glucose of 214 mg/dl since the glucose came down to 141 mg/dl at 8:05 am. Question if there was air in the insulin reservoir. After filling tubing reconnected insulin pump to patient's infusion site. Called patient's wife [name] and spoke with her about when the insulin pump site and insulin reservoir was changed out. [name] stated that she changed insulin reservoir and inserted a new infusion site last night about 9:30 pm. Informed [name] that patient was not able to tell me when it was changed out or if he done a correction this morning. [name] does not know much about inputting any information in the insulin pump but is able to help him set up everything for changing out infusion set and insulin reservoir. Discussed option of using SQ insulin instead of insulin pump due to patient's confusion and unclear if he will be able to manage his insulin pump. [name] states that she is willing to use SQ injections instead of the insulin  pump if needed. Recommend patient remove his insulin pump and use Lantus and Novolog as an inpatient and using Lantus and Humalog (which patient has at home) at time of discharge. Diabetes Coordinator has called Dr. Cindra Eves office and left a message for a return call to get Dr. Cindra Eves recommendations for SQ insulin. Asked [name] to call Dr. Cindra Eves office to set up an appointment for the patient in about 1 week so Dr. Buddy Duty can reassess if patient can transition back to his insulin pump. Patient's wife states she will be here at the hospital within the hour and diabetes coordinator will talk with her further once she arrives.  Tobey Grim, RN, MSN  talked with Dr. Buddy Duty this morning. After discussing patient, Dr. Buddy Duty recommends using Lantus 21 units daily starting now (when removing insulin pump) or Lantus 11 units BID and using Humalog insulin for carb coverage and correction with 1 unit dropping glucose 30 mg/dl with a target glucose of 100-110 mg/dl. In order to reduce insulin injections and simplify regimen anticipate once a day Lantus insulin would be best.  Therefore, would recommend the following for discharge: Lantus 21 units daily (give at time of removing insulin pump) Humalog 4 units TID with meals (for meal coverage) Humalog 0-7 units QID correction scale:  120-150 mg/dl  1 unit 151-200 mg/dl  2 units 201-250 mg/dl  3 units 251-300 mg/dl  4 units 301-350 mg/dl  5 units 350-400 mg/dl  6 units >401 mg/dl  7 units  and call MD   Thanks, Barnie Alderman, RN, MSN, CDE Diabetes Coordinator Inpatient Diabetes Program [phone] (Team Pager from [place] to [place]) [phone] ([place] office) [phone] [facility] office) [phone] [facility] office)

## 2016-02-16 NOTE — Progress Notes (Signed)
Physical Therapy Treatment Patient Details Name: Dale Morrow MRN: [id number] DOB: 17-Jul-1941 Today's Date: 02/16/2016    History of Present Illness 75 y.o. male with a Past Medical History of left de Quervain's tenosynovitis, resting tremor, prostate cancer, diabetes, Bell's palsy, nonischemic cardiomyopathy with EF of 25%, GERD who presents with syncope with severe TBI and left eye injury    PT Comments    Patient progressing with balance and cognition this session following commands for balance activities without delay on need for repeat instructions (verbal and demo given.)  Will continue skilled PT during acute stay.  Follow Up Recommendations  Home health PT;Supervision/Assistance - 24 hour     Equipment Recommendations  None recommended by PT    Recommendations for Other Services       Precautions / Restrictions Precautions Precautions: Fall    Mobility  Bed Mobility   Bed Mobility: Sit to Supine       Sit to supine: Supervision   General bed mobility comments: up in chair; returned to bed at end of session  Transfers Overall transfer level: Needs assistance Equipment used: Rolling walker (2 wheeled) Transfers: Sit to/from Stand Sit to Stand: Min guard         General transfer comment: up from recliner (low seat)  Ambulation/Gait Ambulation/Gait assistance: Min assist;Min guard;Supervision Ambulation Distance (Feet): 220 Feet Assistive device: Rolling walker (2 wheeled);None Gait Pattern/deviations: Step-through pattern;Decreased stride length;Wide base of support;Trunk flexed     General Gait Details: ambulated with walker supervision to minguard for safety; back to room after balance activities min A no device shorter step length and wider BOS, cues for arms at side and erect posture   Stairs            Wheelchair Mobility    Modified Rankin (Stroke Patients Only)       Balance Overall balance assessment: Needs assistance                            High level balance activites: Side stepping;Backward walking High Level Balance Comments: marching forwards, crossing over in back and in front all with bilat UE support (wall rail vs rail and HHA)    Cognition Arousal/Alertness: Awake/alert Behavior During Therapy: WFL for tasks assessed/performed Overall Cognitive Status: Impaired/Different from baseline         Following Commands: Follows multi-step commands consistently Safety/Judgement: Decreased awareness of safety;Decreased awareness of deficits   Problem Solving: Requires verbal cues;Difficulty sequencing      Exercises      General Comments        Pertinent Vitals/Pain Faces Pain Scale: No hurt    Home Living                      Prior Function            PT Goals (current goals can now be found in the care plan section) Progress towards PT goals: Progressing toward goals    Frequency  Min 3X/week    PT Plan Current plan remains appropriate    Co-evaluation             End of Session Equipment Utilized During Treatment: Gait belt Activity Tolerance: Patient tolerated treatment well Patient left: in bed;with call bell/phone within reach;with bed alarm set     Time: [id number] PT Time Calculation (min) (ACUTE ONLY): 25 min  Charges:  $Gait Training: 8-22 mins $Therapeutic Activity: 8-22 mins  G CodesReginia Naas 02/16/2016, 5:19 PM Magda Kiel, [place] 02/16/2016

## 2016-02-17 DIAGNOSIS — I5181 Takotsubo syndrome: Secondary | ICD-10-CM | POA: Diagnosis not present

## 2016-02-17 DIAGNOSIS — G510 Bell's palsy: Secondary | ICD-10-CM | POA: Diagnosis not present

## 2016-02-17 DIAGNOSIS — S065X9D Traumatic subdural hemorrhage with loss of consciousness of unspecified duration, subsequent encounter: Secondary | ICD-10-CM | POA: Diagnosis not present

## 2016-02-17 DIAGNOSIS — R4189 Other symptoms and signs involving cognitive functions and awareness: Secondary | ICD-10-CM | POA: Diagnosis not present

## 2016-02-17 DIAGNOSIS — E1165 Type 2 diabetes mellitus with hyperglycemia: Secondary | ICD-10-CM | POA: Diagnosis not present

## 2016-02-17 DIAGNOSIS — G250 Essential tremor: Secondary | ICD-10-CM | POA: Diagnosis not present

## 2016-02-17 DIAGNOSIS — S066X9D Traumatic subarachnoid hemorrhage with loss of consciousness of unspecified duration, subsequent encounter: Secondary | ICD-10-CM | POA: Diagnosis not present

## 2016-02-17 DIAGNOSIS — S0532XD Ocular laceration without prolapse or loss of intraocular tissue, left eye, subsequent encounter: Secondary | ICD-10-CM | POA: Diagnosis not present

## 2016-02-17 DIAGNOSIS — I5022 Chronic systolic (congestive) heart failure: Secondary | ICD-10-CM | POA: Diagnosis not present

## 2016-02-17 DIAGNOSIS — Z794 Long term (current) use of insulin: Secondary | ICD-10-CM | POA: Diagnosis not present

## 2016-02-17 LAB — URINE CULTURE

## 2016-02-17 MED FILL — ESCITALOPRAM 20 MG TABLET: 20 | 90 days supply | Qty: 45 | Fill #3

## 2016-02-18 ENCOUNTER — Emergency Department (HOSPITAL_COMMUNITY): Payer: 59

## 2016-02-18 ENCOUNTER — Encounter (HOSPITAL_COMMUNITY): Payer: Self-pay | Admitting: Emergency Medicine

## 2016-02-18 ENCOUNTER — Observation Stay (HOSPITAL_COMMUNITY)
Admission: EM | Admit: 2016-02-18 | Discharge: 2016-02-20 | Disposition: A | Discharge status: Skilled Nursing Facility | Payer: 59 | Attending: Internal Medicine | Admitting: Internal Medicine

## 2016-02-18 DIAGNOSIS — R509 Fever, unspecified: Secondary | ICD-10-CM | POA: Diagnosis not present

## 2016-02-18 DIAGNOSIS — Z881 Allergy status to other antibiotic agents status: Secondary | ICD-10-CM | POA: Insufficient documentation

## 2016-02-18 DIAGNOSIS — M50322 Other cervical disc degeneration at C5-C6 level: Secondary | ICD-10-CM | POA: Insufficient documentation

## 2016-02-18 DIAGNOSIS — Z8719 Personal history of other diseases of the digestive system: Secondary | ICD-10-CM | POA: Insufficient documentation

## 2016-02-18 DIAGNOSIS — R627 Adult failure to thrive: Secondary | ICD-10-CM | POA: Diagnosis not present

## 2016-02-18 DIAGNOSIS — Z8669 Personal history of other diseases of the nervous system and sense organs: Secondary | ICD-10-CM | POA: Diagnosis not present

## 2016-02-18 DIAGNOSIS — Z794 Long term (current) use of insulin: Secondary | ICD-10-CM | POA: Diagnosis not present

## 2016-02-18 DIAGNOSIS — E119 Type 2 diabetes mellitus without complications: Secondary | ICD-10-CM | POA: Insufficient documentation

## 2016-02-18 DIAGNOSIS — S065X0A Traumatic subdural hemorrhage without loss of consciousness, initial encounter: Secondary | ICD-10-CM | POA: Diagnosis not present

## 2016-02-18 DIAGNOSIS — H05402 Unspecified enophthalmos, left eye: Secondary | ICD-10-CM | POA: Diagnosis not present

## 2016-02-18 DIAGNOSIS — Z79899 Other long term (current) drug therapy: Secondary | ICD-10-CM | POA: Diagnosis not present

## 2016-02-18 DIAGNOSIS — Z91030 Bee allergy status: Secondary | ICD-10-CM | POA: Diagnosis not present

## 2016-02-18 DIAGNOSIS — Z96652 Presence of left artificial knee joint: Secondary | ICD-10-CM | POA: Diagnosis not present

## 2016-02-18 DIAGNOSIS — Z8673 Personal history of transient ischemic attack (TIA), and cerebral infarction without residual deficits: Secondary | ICD-10-CM | POA: Diagnosis not present

## 2016-02-18 DIAGNOSIS — Z823 Family history of stroke: Secondary | ICD-10-CM | POA: Insufficient documentation

## 2016-02-18 DIAGNOSIS — S066X0A Traumatic subarachnoid hemorrhage without loss of consciousness, initial encounter: Secondary | ICD-10-CM | POA: Diagnosis not present

## 2016-02-18 DIAGNOSIS — F329 Major depressive disorder, single episode, unspecified: Secondary | ICD-10-CM | POA: Insufficient documentation

## 2016-02-18 DIAGNOSIS — E118 Type 2 diabetes mellitus with unspecified complications: Secondary | ICD-10-CM

## 2016-02-18 DIAGNOSIS — G250 Essential tremor: Secondary | ICD-10-CM | POA: Insufficient documentation

## 2016-02-18 DIAGNOSIS — S0003XA Contusion of scalp, initial encounter: Secondary | ICD-10-CM | POA: Insufficient documentation

## 2016-02-18 DIAGNOSIS — I428 Other cardiomyopathies: Secondary | ICD-10-CM

## 2016-02-18 DIAGNOSIS — K219 Gastro-esophageal reflux disease without esophagitis: Secondary | ICD-10-CM | POA: Diagnosis not present

## 2016-02-18 DIAGNOSIS — S065X0D Traumatic subdural hemorrhage without loss of consciousness, subsequent encounter: Secondary | ICD-10-CM | POA: Diagnosis not present

## 2016-02-18 DIAGNOSIS — Z8249 Family history of ischemic heart disease and other diseases of the circulatory system: Secondary | ICD-10-CM | POA: Insufficient documentation

## 2016-02-18 DIAGNOSIS — Z9641 Presence of insulin pump (external) (internal): Secondary | ICD-10-CM | POA: Diagnosis not present

## 2016-02-18 DIAGNOSIS — M50323 Other cervical disc degeneration at C6-C7 level: Secondary | ICD-10-CM | POA: Diagnosis not present

## 2016-02-18 DIAGNOSIS — I6200 Nontraumatic subdural hemorrhage, unspecified: Secondary | ICD-10-CM | POA: Diagnosis not present

## 2016-02-18 DIAGNOSIS — S066X0D Traumatic subarachnoid hemorrhage without loss of consciousness, subsequent encounter: Secondary | ICD-10-CM | POA: Diagnosis not present

## 2016-02-18 DIAGNOSIS — R5081 Fever presenting with conditions classified elsewhere: Secondary | ICD-10-CM | POA: Diagnosis not present

## 2016-02-18 DIAGNOSIS — I429 Cardiomyopathy, unspecified: Secondary | ICD-10-CM | POA: Diagnosis not present

## 2016-02-18 DIAGNOSIS — M1990 Unspecified osteoarthritis, unspecified site: Secondary | ICD-10-CM | POA: Insufficient documentation

## 2016-02-18 DIAGNOSIS — R630 Anorexia: Secondary | ICD-10-CM | POA: Diagnosis not present

## 2016-02-18 DIAGNOSIS — Z91013 Allergy to seafood: Secondary | ICD-10-CM | POA: Diagnosis not present

## 2016-02-18 DIAGNOSIS — S065XAA Traumatic subdural hemorrhage with loss of consciousness status unknown, initial encounter: Secondary | ICD-10-CM | POA: Diagnosis present

## 2016-02-18 DIAGNOSIS — I110 Hypertensive heart disease with heart failure: Secondary | ICD-10-CM | POA: Diagnosis not present

## 2016-02-18 DIAGNOSIS — Z87891 Personal history of nicotine dependence: Secondary | ICD-10-CM | POA: Diagnosis not present

## 2016-02-18 DIAGNOSIS — W19XXXD Unspecified fall, subsequent encounter: Secondary | ICD-10-CM | POA: Insufficient documentation

## 2016-02-18 DIAGNOSIS — R531 Weakness: Secondary | ICD-10-CM | POA: Diagnosis not present

## 2016-02-18 DIAGNOSIS — S065X9A Traumatic subdural hemorrhage with loss of consciousness of unspecified duration, initial encounter: Secondary | ICD-10-CM | POA: Diagnosis present

## 2016-02-18 DIAGNOSIS — Z8546 Personal history of malignant neoplasm of prostate: Secondary | ICD-10-CM | POA: Diagnosis not present

## 2016-02-18 DIAGNOSIS — I5022 Chronic systolic (congestive) heart failure: Secondary | ICD-10-CM | POA: Diagnosis not present

## 2016-02-18 DIAGNOSIS — F32A Depression, unspecified: Secondary | ICD-10-CM | POA: Diagnosis present

## 2016-02-18 DIAGNOSIS — R410 Disorientation, unspecified: Secondary | ICD-10-CM | POA: Diagnosis not present

## 2016-02-18 DIAGNOSIS — D181 Lymphangioma, any site: Secondary | ICD-10-CM | POA: Insufficient documentation

## 2016-02-18 DIAGNOSIS — S0990XD Unspecified injury of head, subsequent encounter: Secondary | ICD-10-CM

## 2016-02-18 DIAGNOSIS — D649 Anemia, unspecified: Secondary | ICD-10-CM | POA: Diagnosis not present

## 2016-02-18 LAB — COMPREHENSIVE METABOLIC PANEL
ALT: 17 U/L (ref 17–63)
AST: 29 U/L (ref 15–41)
Albumin: 3.1 g/dL — ABNORMAL LOW (ref 3.5–5.0)
Alkaline Phosphatase: 65 U/L (ref 38–126)
Anion gap: 12 (ref 5–15)
BILIRUBIN TOTAL: 0.6 mg/dL (ref 0.3–1.2)
BUN: 13 mg/dL (ref 6–20)
CO2: 26 mmol/L (ref 22–32)
CREATININE: 0.83 mg/dL (ref 0.61–1.24)
Calcium: 8.7 mg/dL — ABNORMAL LOW (ref 8.9–10.3)
Chloride: 95 mmol/L — ABNORMAL LOW (ref 101–111)
Glucose, Bld: 193 mg/dL — ABNORMAL HIGH (ref 65–99)
Potassium: 3.9 mmol/L (ref 3.5–5.1)
Sodium: 133 mmol/L — ABNORMAL LOW (ref 135–145)
TOTAL PROTEIN: 6.6 g/dL (ref 6.5–8.1)

## 2016-02-18 LAB — CBC WITH DIFFERENTIAL/PLATELET
BASOS ABS: 0 10*3/uL (ref 0.0–0.1)
BASOS PCT: 0 %
EOS ABS: 0 10*3/uL (ref 0.0–0.7)
EOS PCT: 0 %
HCT: 32.4 % — ABNORMAL LOW (ref 39.0–52.0)
Hemoglobin: 10.8 g/dL — ABNORMAL LOW (ref 13.0–17.0)
Lymphocytes Relative: 15 %
Lymphs Abs: 1.2 10*3/uL (ref 0.7–4.0)
MCH: 30.3 pg (ref 26.0–34.0)
MCHC: 33.3 g/dL (ref 30.0–36.0)
MCV: 91 fL (ref 78.0–100.0)
Monocytes Absolute: 0.8 10*3/uL (ref 0.1–1.0)
Monocytes Relative: 10 %
Neutro Abs: 5.9 10*3/uL (ref 1.7–7.7)
Neutrophils Relative %: 75 %
PLATELETS: 255 10*3/uL (ref 150–400)
RBC: 3.56 MIL/uL — AB (ref 4.22–5.81)
RDW: 13.6 % (ref 11.5–15.5)
WBC: 7.9 10*3/uL (ref 4.0–10.5)

## 2016-02-18 LAB — URINALYSIS, ROUTINE W REFLEX MICROSCOPIC
Bilirubin Urine: NEGATIVE
Glucose, UA: 250 mg/dL — AB
HGB URINE DIPSTICK: NEGATIVE
Ketones, ur: 15 mg/dL — AB
LEUKOCYTES UA: NEGATIVE
NITRITE: NEGATIVE
PROTEIN: NEGATIVE mg/dL
Specific Gravity, Urine: 1.027 (ref 1.005–1.030)
pH: 6.5 (ref 5.0–8.0)

## 2016-02-18 LAB — I-STAT CG4 LACTIC ACID, ED: Lactic Acid, Venous: 1.93 mmol/L (ref 0.5–2.0)

## 2016-02-18 LAB — TROPONIN I: TROPONIN I: 0.03 ng/mL (ref <0.031)

## 2016-02-18 MED ORDER — PRIMIDONE 50 MG PO TABS
100.0000 mg | ORAL_TABLET | Freq: Three times a day (TID) | ORAL | Status: DC
  Administered 2016-02-18 – 2016-02-20 (×5): 100 mg via ORAL
  Filled 2016-02-18 (×8): qty 2

## 2016-02-18 MED ORDER — POLYETHYLENE GLYCOL 3350 17 G PO PACK
17.0000 g | PACK | Freq: Every day | ORAL | Status: DC
  Administered 2016-02-19: 17 g via ORAL
  Filled 2016-02-18: qty 1

## 2016-02-18 MED ORDER — ONDANSETRON HCL 4 MG PO TABS
4.0000 mg | ORAL_TABLET | Freq: Four times a day (QID) | ORAL | Status: DC | PRN
  Administered 2016-02-19 (×2): 4 mg via ORAL
  Filled 2016-02-18 (×3): qty 1

## 2016-02-18 MED ORDER — FLEET ENEMA 7-19 GM/118ML RE ENEM: 1.0000 | ENEMA | Freq: Once | RECTAL | Status: DC | PRN

## 2016-02-18 MED ORDER — ONDANSETRON HCL 4 MG/2ML IJ SOLN: 4.0000 mg | Freq: Four times a day (QID) | INTRAMUSCULAR | Status: DC | PRN

## 2016-02-18 MED ORDER — SODIUM CHLORIDE 0.9 % IV BOLUS (SEPSIS)
1000.0000 mL | Freq: Once | INTRAVENOUS | Status: AC
Start: 2016-02-18 — End: 2016-02-18
  Administered 2016-02-18: 1000 mL via INTRAVENOUS

## 2016-02-18 MED ORDER — DORZOLAMIDE HCL-TIMOLOL MAL 2-0.5 % OP SOLN
1.0000 [drp] | Freq: Two times a day (BID) | OPHTHALMIC | Status: DC
  Administered 2016-02-19 – 2016-02-20 (×3): 1 [drp] via OPHTHALMIC
  Filled 2016-02-18: qty 10

## 2016-02-18 MED ORDER — ESCITALOPRAM OXALATE 10 MG PO TABS
20.0000 mg | ORAL_TABLET | Freq: Every day | ORAL | Status: DC
  Administered 2016-02-19 – 2016-02-20 (×2): 20 mg via ORAL
  Filled 2016-02-18 (×2): qty 2

## 2016-02-18 MED ORDER — INSULIN GLARGINE 100 UNIT/ML ~~LOC~~ SOLN
21.0000 [IU] | Freq: Every day | SUBCUTANEOUS | Status: DC
  Administered 2016-02-19: 21 [IU] via SUBCUTANEOUS
  Filled 2016-02-18: qty 0.21

## 2016-02-18 MED ORDER — PROPRANOLOL HCL ER 60 MG PO CP24
60.0000 mg | ORAL_CAPSULE | Freq: Every day | ORAL | Status: DC
  Administered 2016-02-19 – 2016-02-20 (×2): 60 mg via ORAL
  Filled 2016-02-18 (×3): qty 1

## 2016-02-18 MED ORDER — PREGABALIN 50 MG PO CAPS
100.0000 mg | ORAL_CAPSULE | Freq: Two times a day (BID) | ORAL | Status: DC
  Administered 2016-02-18 – 2016-02-20 (×4): 100 mg via ORAL
  Filled 2016-02-18 (×4): qty 2

## 2016-02-18 MED ORDER — ACETAMINOPHEN 325 MG PO TABS
650.0000 mg | ORAL_TABLET | Freq: Four times a day (QID) | ORAL | Status: DC | PRN
  Administered 2016-02-19 – 2016-02-20 (×4): 650 mg via ORAL
  Filled 2016-02-18 (×4): qty 2

## 2016-02-18 MED ORDER — ACETAMINOPHEN 650 MG RE SUPP: 650.0000 mg | Freq: Four times a day (QID) | RECTAL | Status: DC | PRN

## 2016-02-18 MED ORDER — SODIUM CHLORIDE 0.9% FLUSH
3.0000 mL | Freq: Two times a day (BID) | INTRAVENOUS | Status: DC
  Administered 2016-02-18: 10 mL via INTRAVENOUS
  Administered 2016-02-19 – 2016-02-20 (×3): 3 mL via INTRAVENOUS

## 2016-02-18 MED ORDER — INSULIN GLARGINE 100 UNIT/ML SOLOSTAR PEN: 21.0000 [IU] | PEN_INJECTOR | Freq: Every morning | SUBCUTANEOUS | Status: DC

## 2016-02-18 MED ORDER — INSULIN ASPART 100 UNIT/ML ~~LOC~~ SOLN
0.0000 [IU] | Freq: Three times a day (TID) | SUBCUTANEOUS | Status: DC
  Administered 2016-02-19: 5 [IU] via SUBCUTANEOUS

## 2016-02-18 MED ORDER — POLYETHYLENE GLYCOL 3350 17 GM/SCOOP PO POWD: 17.0000 g | Freq: Every day | ORAL | Status: DC

## 2016-02-18 MED ORDER — LISINOPRIL 2.5 MG PO TABS
2.5000 mg | ORAL_TABLET | Freq: Two times a day (BID) | ORAL | Status: DC
  Administered 2016-02-18 – 2016-02-20 (×4): 2.5 mg via ORAL
  Filled 2016-02-18 (×4): qty 1

## 2016-02-18 MED ORDER — PANTOPRAZOLE SODIUM 40 MG IV SOLR
40.0000 mg | Freq: Every day | INTRAVENOUS | Status: DC
  Administered 2016-02-19 (×2): 40 mg via INTRAVENOUS
  Filled 2016-02-18 (×2): qty 40

## 2016-02-18 NOTE — ED Provider Notes (Addendum)
CSN: [id number]     Arrival date & time 02/18/16  1506 History   First MD Initiated Contact with Patient 02/18/16 1508     Chief Complaint  Patient presents with  . Fatigue     (Consider location/radiation/quality/duration/timing/severity/associated sxs/prior Treatment) The history is provided by the patient, the EMS personnel and the spouse. The history is limited by the condition of the patient.  Patient w recent fall 1 week ago with SDH, intraparenchymal contusion, IVH, facial contusions, presents c/o increased generalized weakness x 1 day.  No focal or unilateral numbness/weakness. Feels generally tired, lack of energy, weak all over. EMS notes fever. Patient denies source of fever. No cough, nasal congestion, headache or uri c/o. No chest pain or sob. No abd pain. No vomiting or diarrhea. Denies dysuria or gu c/o. No rash/skin lesions. Symptoms moderate, constant, persistent since onset, no specific exacerbating or alleviating factors.  No recurrent fall since returning home.     Past Medical History  Diagnosis Date  . Arthritis     left knee  . Depression   . De Quervain's tenosynovitis, left 04/2012  . Tremors of nervous system     takes propranolol for tremors  . Prostate cancer, primary, with metastasis from prostate to other site [facility])     a. metastasis to lymph nodes;  b. s/p XRT.  [name] Complication of anesthesia     pt denies ponv, pt woke up during colonscopy in past  . Diabetes mellitus     Insulin pump, takes lisinopril for kidneys, dr Buddy Duty endocrinologist  . Left-sided Bell's palsy   . Right-sided Bell's palsy   . Nonischemic cardiomyopathy ([place])     a. 04/2015 Echo: EF 25-30%, mid apical, antseptal, anterolat, inflat, infsept, apical AK. ? takotsubo. No thrombus. PASP 67mmHg;  b. 04/2014 Lexi MV: EF 29%, fixed mod size and intensity mid-dist ant, apical, inferior scar w/ wma's;  c. 04/2015 Cath: LM nl, LAD 50p/m, LCX 25p, RI nl, RCA nl, RPAV 50d-->Med Rx.  . Chronic  systolic CHF (congestive heart failure) ([place])     a. 04/2015 Echo: EF 25-30%.  [name] GERD (gastroesophageal reflux disease)    Past Surgical History  Procedure Laterality Date  . Hemorrhoid surgery  1970s  . Anterior cruciate ligament repair      right knee  . Wrist arthroscopy  05/03/2010    right; and release of 1st dorsal compartment  . Knee arthrotomy  03/18/2008    left; with scar exc.  [name] Knee arthroscopy  09/25/2007; 04/15/2005    left  . Knee joint manipulation  06/29/2007    left  . Total knee arthroplasty  04/29/2007    left  . Dorsal compartment release  04/21/2012    Procedure: RELEASE DORSAL COMPARTMENT (DEQUERVAIN);  Surgeon: Cammie Sickle., MD;  Location: [facility];  Service: Orthopedics;  Laterality: Left;  left 1st dorsal compartment release left wrist   . Minor amputation of digit Left 07/21/2013    Procedure: LEFT SMALL REVISION AMPUTATION (MINOR PROCEDURE) ;  Surgeon: Schuyler Amor, MD;  Location: [place];  Service: Orthopedics;  Laterality: Left;  . Colonoscopy with propofol N/A 04/07/2014    Procedure: COLONOSCOPY WITH PROPOFOL;  Surgeon: Jeryl Columbia, MD;  Location: [facility];  Service: Endoscopy;  Laterality: N/A;  . Total knee revision Left 01/25/2015    Procedure: LEFT TOTAL KNEE  ARTHOPLASTY REVISION;  Surgeon: Gaynelle Arabian, MD;  Location: [facility];  Service: Orthopedics;  Laterality:  Left;  . Cardiac catheterization N/A 04/26/2015    Procedure: Left Heart Cath and Coronary Angiography;  Surgeon: Jettie Booze, MD;  Location: [place] [facility];  Service: Cardiovascular;  Laterality: N/A;  . Cardiac catheterization  04/26/2015    Procedure: Intravascular Ultrasound/IVUS;  Surgeon: Jettie Booze, MD;  Location: [place] [facility];  Service: Cardiovascular;;  . Carpal tunnel release Left 07/05/2015    Procedure: LEFT CARPAL TUNNEL RELEASE;  Surgeon: Charlotte Crumb, MD;  Location: [place];  Service:  Orthopedics;  Laterality: Left;  . Ulnar tunnel release Left 07/05/2015    Procedure: LEFT CUBITAL TUNNEL RELEASE;  Surgeon: Charlotte Crumb, MD;  Location: [place];  Service: Orthopedics;  Laterality: Left;   Family History  Problem Relation Age of Onset  . Heart Problems Father   . Heart attack Father   . Hypertension Mother   . Stroke Mother    Social History  Substance Use Topics  . Smoking status: Former Smoker -- 15 years    Types: Cigarettes    Quit date: 10/13/1979  . Smokeless tobacco: Never Used  . Alcohol Use: No    Review of Systems  Constitutional: Positive for fever.  HENT: Negative for sore throat.   Eyes: Negative for discharge.  Respiratory: Negative for cough and shortness of breath.   Cardiovascular: Negative for chest pain.  Gastrointestinal: Negative for vomiting, abdominal pain and diarrhea.  Endocrine: Negative for polyuria.  Genitourinary: Negative for dysuria and flank pain.  Musculoskeletal: Negative for back pain, neck pain and neck stiffness.  Skin: Negative for rash.  Neurological: Negative for numbness and headaches.  Hematological: Does not bruise/bleed easily.  Psychiatric/Behavioral: Negative for confusion.      Allergies  Bee venom; Shellfish allergy; and Augmentin  Home Medications   Prior to Admission medications   Medication Sig Start Date End Date Taking? Authorizing Provider  CALCIUM-VITAMIN D PO Take 1 tablet by mouth daily at 12 noon.    Historical Provider, MD  dorzolamide-timolol (COSOPT) 22.3-6.8 MG/ML ophthalmic solution Place 1 drop into the left eye 2 (two) times daily. 02/16/16   Janece Canterbury, MD  escitalopram (LEXAPRO) 20 MG tablet Take 20 mg by mouth daily.    Historical Provider, MD  Insulin Glargine (LANTUS) 100 UNIT/ML Solostar Pen Inject 21 Units into the skin daily. 02/16/16   Janece Canterbury, MD  insulin lispro (HUMALOG KWIKPEN) 100 UNIT/ML KiwkPen Inject 0-0.11 mLs (0-11 Units total) into the  skin 3 (three) times daily. Per sliding scale 02/16/16   Janece Canterbury, MD  lisinopril (PRINIVIL,ZESTRIL) 2.5 MG tablet TAKE 1 TABLET BY MOUTH TWICE DAILY 01/22/16   Brittainy Erie Noe, PA-C  Multiple Vitamins-Minerals (ONE-A-DAY MENS 50+ ADVANTAGE PO) Take 1 tablet by mouth daily at 12 noon.    Historical Provider, MD  ondansetron (ZOFRAN) 4 MG tablet Take 1 tablet (4 mg total) by mouth every 8 (eight) hours as needed for nausea or vomiting. 02/16/16   Lisette Abu, PA-C  polyethylene glycol powder (GLYCOLAX/MIRALAX) powder Take 17 g by mouth at bedtime.     Historical Provider, MD  pregabalin (LYRICA) 100 MG capsule Take 1 capsule (100 mg total) by mouth 3 (three) times daily. 01/03/16   Marcial Pacas, MD  primidone (MYSOLINE) 50 MG tablet TAKE 2 TABLETS BY MOUTH 3 TIMES DAILY. 01/04/16   Marcial Pacas, MD  propranolol ER (INDERAL LA) 60 MG 24 hr capsule Take 1 capsule (60 mg total) by mouth daily. 10/30/15   Yijun  Krista Blue, MD  ranitidine (ZANTAC) 150 MG tablet Take 150 mg by mouth 2 (two) times daily.    Historical Provider, MD  rosuvastatin (CRESTOR) 10 MG tablet Take 1 tablet (10 mg total) by mouth daily. 02/16/16   Janece Canterbury, MD   BP 141/61 mmHg  Pulse 53  Temp(Src) 100.9 F (38.3 C) (Rectal)  Resp 18  SpO2 99% Physical Exam  Constitutional: He is oriented to person, place, and time. He appears well-developed and well-nourished. No distress.  HENT:  Bruising noted about head and face from recent fall.   Eyes: EOM are normal. Pupils are equal, round, and reactive to light.  Neck: Normal range of motion. Neck supple. No tracheal deviation present. No thyromegaly present.  No stiffness or rigidity. No bruits  Cardiovascular: Regular rhythm, normal heart sounds and intact distal pulses.  Exam reveals no gallop and no friction rub.   No murmur heard. Pulmonary/Chest: Effort normal and breath sounds normal. No accessory muscle usage. No respiratory distress.  Abdominal: Soft. Bowel sounds are  normal. He exhibits no distension. There is no tenderness.  Genitourinary:  No cva tenderness  Musculoskeletal: Normal range of motion. He exhibits no edema.  Lymphadenopathy:    He has no cervical adenopathy.  Neurological: He is alert and oriented to person, place, and time.  Motor intact bil, stre 5/5. sens grossly intact  Skin: Skin is warm and dry. No rash noted. He is not diaphoretic.  Psychiatric: He has a normal mood and affect.  Nursing note and vitals reviewed.   ED Course  Procedures (including critical care time) Labs Review  Results for orders placed or performed during the hospital encounter of 02/18/16  Comprehensive metabolic panel  Result Value Ref Range   Sodium 133 (L) 135 - 145 mmol/L   Potassium 3.9 3.5 - 5.1 mmol/L   Chloride 95 (L) 101 - 111 mmol/L   CO2 26 22 - 32 mmol/L   Glucose, Bld 193 (H) 65 - 99 mg/dL   BUN 13 6 - 20 mg/dL   Creatinine, Ser 0.83 0.61 - 1.24 mg/dL   Calcium 8.7 (L) 8.9 - 10.3 mg/dL   Total Protein 6.6 6.5 - 8.1 g/dL   Albumin 3.1 (L) 3.5 - 5.0 g/dL   AST 29 15 - 41 U/L   ALT 17 17 - 63 U/L   Alkaline Phosphatase 65 38 - 126 U/L   Total Bilirubin 0.6 0.3 - 1.2 mg/dL   GFR calc non Af Amer >60 >60 mL/min   GFR calc Af Amer >60 >60 mL/min   Anion gap 12 5 - 15  CBC WITH DIFFERENTIAL  Result Value Ref Range   WBC 7.9 4.0 - 10.5 K/uL   RBC 3.56 (L) 4.22 - 5.81 MIL/uL   Hemoglobin 10.8 (L) 13.0 - 17.0 g/dL   HCT 32.4 (L) 39.0 - 52.0 %   MCV 91.0 78.0 - 100.0 fL   MCH 30.3 26.0 - 34.0 pg   MCHC 33.3 30.0 - 36.0 g/dL   RDW 13.6 11.5 - 15.5 %   Platelets 255 150 - 400 K/uL   Neutrophils Relative % 75 %   Neutro Abs 5.9 1.7 - 7.7 K/uL   Lymphocytes Relative 15 %   Lymphs Abs 1.2 0.7 - 4.0 K/uL   Monocytes Relative 10 %   Monocytes Absolute 0.8 0.1 - 1.0 K/uL   Eosinophils Relative 0 %   Eosinophils Absolute 0.0 0.0 - 0.7 K/uL   Basophils Relative 0 %   Basophils  Absolute 0.0 0.0 - 0.1 K/uL  Urinalysis, Routine w reflex  microscopic (not at [facility])  Result Value Ref Range   Color, Urine YELLOW YELLOW   APPearance CLEAR CLEAR   Specific Gravity, Urine 1.027 1.005 - 1.030   pH 6.5 5.0 - 8.0   Glucose, UA 250 (A) NEGATIVE mg/dL   Hgb urine dipstick NEGATIVE NEGATIVE   Bilirubin Urine NEGATIVE NEGATIVE   Ketones, ur 15 (A) NEGATIVE mg/dL   Protein, ur NEGATIVE NEGATIVE mg/dL   Nitrite NEGATIVE NEGATIVE   Leukocytes, UA NEGATIVE NEGATIVE  Troponin I  Result Value Ref Range   Troponin I 0.03 <0.031 ng/mL  I-Stat CG4 Lactic Acid, ED  (not at  [facility])  Result Value Ref Range   Lactic Acid, Venous 1.93 0.5 - 2.0 mmol/L    Ct Head Wo Contrast  02/18/2016  CLINICAL DATA:  Altered mental status. Recent fall and subdural hematoma. Subsequent encounter EXAM: CT HEAD WITHOUT CONTRAST TECHNIQUE: Contiguous axial images were obtained from the base of the skull through the vertex without intravenous contrast. COMPARISON:  02/14/2016 FINDINGS: Skull and Sinuses:Swelling around the left face and frontal scalp is diminished. Negative for fracture or hemo sinus. Visualized orbits: Bilateral cataract resection.  No acute finding. Brain: Very similar volume and extent of intracranial hemorrhage as seen previously. There is scattered traumatic pattern subarachnoid blood greatest in the anterior hemispheric sulci and low left sylvian fissure. Small volume intraventricular hemorrhage layering in the occipital horns of the lateral ventricles. No hydrocephalus or change in ventricular volume. There are bilateral predominantly low-density subdural collections with mild cortical mass effect. The subdural clot in the right frontal region is mildly increased from prior, interval hemorrhage versus redistribution, measuring 4 mm in thickness today. No parenchymal hemorrhage or swelling. No acute infarct. IMPRESSION: Compared to 02/14/2016, re- distributed or mildly increased right frontal subdural hematoma without increased mass effect. Elsewhere  stable intracranial hemorrhage that is subdural, subarachnoid, and intraventricular. Electronically Signed   By: Monte Fantasia M.D.   On: 02/18/2016 20:38     I have personally reviewed and evaluated these images and lab results as part of my medical decision-making.   EKG Interpretation   Date/Time:  Sunday Feb 18 2016 15:16:04 EDT Ventricular Rate:  54 PR Interval:  130 QRS Duration: 92 QT Interval:  478 QTC Calculation: 453 R Axis:   47 Text Interpretation:  Sinus rhythm Normal ECG No significant change since  last tracing Confirmed by Christy Gentles  MD, DONALD ([id number]) on 02/18/2016 3:45:21  PM      MDM   Iv ns. Labs. Cultures.   Ecg. Cxr.  Reviewed nursing notes and prior charts for additional history. Recent cts/xray results reviewed.   Labs. Imaging.  Spouse arrives, states since d/c from hospital, patient generally weak, very poor po intake, cant walk on own, today increased weakness, and fevers.   Ct results noted.   Cultures pending.  cxr and ua neg for infection.   Given weakness, failure to thrive, fevers, recent significant head injury/SDH, will admit to med service.   Discussed w hospitalists, they will admit to tele.     Lajean Saver, MD 02/18/16 2128

## 2016-02-18 NOTE — ED Notes (Signed)
Attempted report 

## 2016-02-18 NOTE — H&P (Signed)
History and Physical    Dale Morrow [id number] DOB: 1941/05/27 DOA: 02/18/2016  Referring MD/NP/PA: Lajean Saver, MD PCP: Jonathon Bellows, MD  Outpatient Specialists:  Patient coming from: Home.  Chief Complaint: Weakness and fatigue.  HPI: Dale Morrow is a 75 y.o. male with medical history significant of osteoarthritis, depression, essential tremor, prostate CVA, diabetes mellitus, nonischemic cardiomyopathy, chronic systolic CHF, GERD who was recently admitted and discharged (05/02 to 02/16/2016) secondary to subdural hematoma.   Per patient's wife, since he was discharged 2 days ago his weakness and fatigue have worsened. Apparently the patient was initially going to go for rehabilitation, but improved in getting his hospital stay and they thought that they could continue rehabilitation at home. However, patient has been worsening since he got home. He also has continued to have some occasional dyspnea swell, but no chest pain, palpitations, diaphoresis, pitting edema of the lower extremities, orthopnea or PND.   ED Course: In the ER, repeat imaging showed mildly increased versus redistribution of subdural bleed.  Review of Systems: As per HPI otherwise 10 point review of systems negative.  Past Medical History  Diagnosis Date  . Arthritis     left knee  . Depression   . De Quervain's tenosynovitis, left 04/2012  . Tremors of nervous system     takes propranolol for tremors  . Prostate cancer, primary, with metastasis from prostate to other site [facility])     a. metastasis to lymph nodes;  b. s/p XRT.  [name] Complication of anesthesia     pt denies ponv, pt woke up during colonscopy in past  . Diabetes mellitus     Insulin pump, takes lisinopril for kidneys, dr Buddy Duty endocrinologist  . Left-sided Bell's palsy   . Right-sided Bell's palsy   . Nonischemic cardiomyopathy ([place])     a. 04/2015 Echo: EF 25-30%, mid apical, antseptal, anterolat, inflat, infsept, apical AK. ? takotsubo.  No thrombus. PASP 58mmHg;  b. 04/2014 Lexi MV: EF 29%, fixed mod size and intensity mid-dist ant, apical, inferior scar w/ wma's;  c. 04/2015 Cath: LM nl, LAD 50p/m, LCX 25p, RI nl, RCA nl, RPAV 50d-->Med Rx.  . Chronic systolic CHF (congestive heart failure) ([place])     a. 04/2015 Echo: EF 25-30%.  [name] GERD (gastroesophageal reflux disease)     Past Surgical History  Procedure Laterality Date  . Hemorrhoid surgery  1970s  . Anterior cruciate ligament repair      right knee  . Wrist arthroscopy  05/03/2010    right; and release of 1st dorsal compartment  . Knee arthrotomy  03/18/2008    left; with scar exc.  [name] Knee arthroscopy  09/25/2007; 04/15/2005    left  . Knee joint manipulation  06/29/2007    left  . Total knee arthroplasty  04/29/2007    left  . Dorsal compartment release  04/21/2012    Procedure: RELEASE DORSAL COMPARTMENT (DEQUERVAIN);  Surgeon: Cammie Sickle., MD;  Location: [facility];  Service: Orthopedics;  Laterality: Left;  left 1st dorsal compartment release left wrist   . Minor amputation of digit Left 07/21/2013    Procedure: LEFT SMALL REVISION AMPUTATION (MINOR PROCEDURE) ;  Surgeon: Schuyler Amor, MD;  Location: [place];  Service: Orthopedics;  Laterality: Left;  . Colonoscopy with propofol N/A 04/07/2014    Procedure: COLONOSCOPY WITH PROPOFOL;  Surgeon: Jeryl Columbia, MD;  Location: [facility];  Service: Endoscopy;  Laterality: N/A;  .  Total knee revision Left 01/25/2015    Procedure: LEFT TOTAL KNEE  ARTHOPLASTY REVISION;  Surgeon: Gaynelle Arabian, MD;  Location: [facility];  Service: Orthopedics;  Laterality: Left;  . Cardiac catheterization N/A 04/26/2015    Procedure: Left Heart Cath and Coronary Angiography;  Surgeon: Jettie Booze, MD;  Location: [place] [facility];  Service: Cardiovascular;  Laterality: N/A;  . Cardiac catheterization  04/26/2015    Procedure: Intravascular Ultrasound/IVUS;  Surgeon: Jettie Booze, MD;   Location: [place] [facility];  Service: Cardiovascular;;  . Carpal tunnel release Left 07/05/2015    Procedure: LEFT CARPAL TUNNEL RELEASE;  Surgeon: Charlotte Crumb, MD;  Location: [place];  Service: Orthopedics;  Laterality: Left;  . Ulnar tunnel release Left 07/05/2015    Procedure: LEFT CUBITAL TUNNEL RELEASE;  Surgeon: Charlotte Crumb, MD;  Location: [place];  Service: Orthopedics;  Laterality: Left;     reports that he quit smoking about 36 years ago. His smoking use included Cigarettes. He quit after 15 years of use. He has never used smokeless tobacco. He reports that he does not drink alcohol or use illicit drugs.  Allergies  Allergen Reactions  . Bee Venom Anaphylaxis  . Shellfish Allergy Anaphylaxis  . Augmentin [Amoxicillin-Pot Clavulanate] Rash    Family History  Problem Relation Age of Onset  . Heart Problems Father   . Heart attack Father   . Hypertension Mother   . Stroke Mother   Family history reviewed.  Prior to Admission medications   Medication Sig Start Date End Date Taking? Authorizing Provider  CALCIUM-VITAMIN D PO Take 1 tablet by mouth daily at 12 noon.   Yes Historical Provider, MD  dorzolamide-timolol (COSOPT) 22.3-6.8 MG/ML ophthalmic solution Place 1 drop into the left eye 2 (two) times daily. 02/16/16  Yes Janece Canterbury, MD  escitalopram (LEXAPRO) 20 MG tablet Take 20 mg by mouth daily.   Yes Historical Provider, MD  Insulin Glargine (LANTUS) 100 UNIT/ML Solostar Pen Inject 21 Units into the skin daily. Patient taking differently: Inject 21 Units into the skin every morning.  02/16/16  Yes Janece Canterbury, MD  insulin lispro (HUMALOG KWIKPEN) 100 UNIT/ML KiwkPen Inject 0-0.11 mLs (0-11 Units total) into the skin 3 (three) times daily. Per sliding scale 02/16/16  Yes Janece Canterbury, MD  lisinopril (PRINIVIL,ZESTRIL) 2.5 MG tablet TAKE 1 TABLET BY MOUTH TWICE DAILY 01/22/16  Yes Brittainy Erie Noe, PA-C  Multiple  Vitamins-Minerals (ONE-A-DAY MENS 50+ ADVANTAGE PO) Take 1 tablet by mouth daily at 12 noon.   Yes Historical Provider, MD  polyethylene glycol powder (GLYCOLAX/MIRALAX) powder Take 17 g by mouth at bedtime.    Yes Historical Provider, MD  pregabalin (LYRICA) 100 MG capsule Take 1 capsule (100 mg total) by mouth 3 (three) times daily. Patient taking differently: Take 100 mg by mouth 2 (two) times daily.  01/03/16  Yes Marcial Pacas, MD  primidone (MYSOLINE) 50 MG tablet TAKE 2 TABLETS BY MOUTH 3 TIMES DAILY. 01/04/16  Yes Marcial Pacas, MD  propranolol ER (INDERAL LA) 60 MG 24 hr capsule Take 1 capsule (60 mg total) by mouth daily. 10/30/15  Yes Marcial Pacas, MD  ondansetron (ZOFRAN) 4 MG tablet Take 1 tablet (4 mg total) by mouth every 8 (eight) hours as needed for nausea or vomiting. 02/16/16   Lisette Abu, PA-C  rosuvastatin (CRESTOR) 10 MG tablet Take 1 tablet (10 mg total) by mouth daily. 02/16/16   Janece Canterbury, MD  zolendronic acid (ZOMETA) 4  MG/5ML injection Inject 4 mg into the vein once.    Historical Provider, MD    Physical Exam: Filed Vitals:   02/18/16 1845 02/18/16 1900 02/18/16 1930 02/18/16 2000  BP: 157/61 146/63 147/69 147/74  Pulse: 57 57 60 55  Temp:      TempSrc:      Resp: 14 15 16 20   SpO2: 96% 96% 99% 97%      Constitutional: NAD, calm, comfortable Filed Vitals:   02/18/16 1845 02/18/16 1900 02/18/16 1930 02/18/16 2000  BP: 157/61 146/63 147/69 147/74  Pulse: 57 57 60 55  Temp:      TempSrc:      Resp: 14 15 16 20   SpO2: 96% 96% 99% 97%   Eyes: Positive periorbital ecchymosis from recent fall. ENMT: Mucous membranes are moist. Posterior pharynx clear of any exudate or lesions. Neck: normal, supple, no masses, no thyromegaly Respiratory: clear to auscultation bilaterally, no wheezing, no crackles. Normal respiratory effort. No accessory muscle use.  Cardiovascular: Regular rate and rhythm, no murmurs / rubs / gallops. No extremity edema. 2+ pedal pulses. No  carotid bruits.  Abdomen: no tenderness, no masses palpated. No hepatosplenomegaly. Bowel sounds positive.  Musculoskeletal: no clubbing / cyanosis. No joint deformity upper and lower extremities. Good ROM, no contractures. Normal muscle tone.  Skin: no rashes, lesions, ulcers. No induration Neurologic: CN 2-12 grossly intact. Sensation intact, DTR normal. Global weakness, but no focalities.[name]  Psychiatric: Normal judgment and insight. Alert and oriented x 3. Normal mood.   Labs on Admission: I have personally reviewed following labs and imaging studies  CBC:  Recent Labs Lab 02/13/16 0322 02/13/16 0821 02/14/16 0350 02/16/16 1047 02/18/16 1524  WBC 11.1* 8.0 7.1 6.8 7.9  NEUTROABS 9.4* 6.5  --   --  5.9  HGB 11.7* 10.4* 10.0* 10.9* 10.8*  HCT 35.9* 31.3* 29.9* 32.3* 32.4*  MCV 94.5 92.3 94.0 91.2 91.0  PLT 202 201 185 208 [id number]   Basic Metabolic Panel:  Recent Labs Lab 02/13/16 0322 02/13/16 0821 02/14/16 0350 02/16/16 1047 02/18/16 1524  NA 136 139 138 138 133*  K 5.8* 4.7 3.7 3.8 3.9  CL 99* 104 105 103 95*  CO2 22 20* 23 26 26   GLUCOSE 605* 355* 191* 100* 193*  BUN 30* 22* 11 11 13   CREATININE 1.20 1.10 0.79 0.65 0.83  CALCIUM 8.8* 8.4* 7.9* 8.7* 8.7*   GFR: Estimated Creatinine Clearance: 73 mL/min (by C-G formula based on Cr of 0.83). Liver Function Tests:  Recent Labs Lab 02/13/16 0322 02/18/16 1524  AST 53* 29  ALT 34 17  ALKPHOS 84 65  BILITOT 1.2 0.6  PROT 7.3 6.6  ALBUMIN 4.0 3.1*   Coagulation Profile:  Recent Labs Lab 02/13/16 0821  INR 1.23   Cardiac Enzymes:  Recent Labs Lab 02/18/16 1524  TROPONINI 0.03   CBG:  Recent Labs Lab 02/15/16 2115 02/16/16 0637 02/16/16 0805 02/16/16 1145 02/16/16 1627  GLUCAP 242* 214* 141* 87 238*   Urine analysis:    Component Value Date/Time   COLORURINE YELLOW 02/18/2016 1826   APPEARANCEUR CLEAR 02/18/2016 1826   LABSPEC 1.027 02/18/2016 1826   PHURINE 6.5 02/18/2016 1826   GLUCOSEU  250* 02/18/2016 1826   HGBUR NEGATIVE 02/18/2016 1826   BILIRUBINUR NEGATIVE 02/18/2016 1826   KETONESUR 15* 02/18/2016 1826   PROTEINUR NEGATIVE 02/18/2016 1826   UROBILINOGEN 0.2 04/19/2015 0210   NITRITE NEGATIVE 02/18/2016 1826   LEUKOCYTESUR NEGATIVE 02/18/2016 1826    Recent Results (from  the past 240 hour(s))  Urine culture     Status: Abnormal   Collection Time: 02/13/16 10:45 AM  Result Value Ref Range Status   Specimen Description URINE, RANDOM  Final   Special Requests NONE  Final   Culture MULTIPLE SPECIES PRESENT, SUGGEST RECOLLECTION (A)  Final   Report Status 02/14/2016 FINAL  Final  MRSA PCR Screening     Status: None   Collection Time: 02/13/16  5:05 PM  Result Value Ref Range Status   MRSA by PCR NEGATIVE NEGATIVE Final    Comment:        The GeneXpert MRSA Assay (FDA approved for NASAL specimens only), is one component of a comprehensive MRSA colonization surveillance program. It is not intended to diagnose MRSA infection nor to guide or monitor treatment for MRSA infections.   Culture, Urine     Status: Abnormal   Collection Time: 02/16/16 12:18 PM  Result Value Ref Range Status   Specimen Description URINE, CLEAN CATCH  Final   Special Requests NONE  Final   Culture 6,000 COLONIES/mL INSIGNIFICANT GROWTH (A)  Final   Report Status 02/17/2016 FINAL  Final     Radiological Exams on Admission: Ct Head Wo Contrast  02/18/2016  CLINICAL DATA:  Altered mental status. Recent fall and subdural hematoma. Subsequent encounter EXAM: CT HEAD WITHOUT CONTRAST TECHNIQUE: Contiguous axial images were obtained from the base of the skull through the vertex without intravenous contrast. COMPARISON:  02/14/2016 FINDINGS: Skull and Sinuses:Swelling around the left face and frontal scalp is diminished. Negative for fracture or hemo sinus. Visualized orbits: Bilateral cataract resection.  No acute finding. Brain: Very similar volume and extent of intracranial hemorrhage as  seen previously. There is scattered traumatic pattern subarachnoid blood greatest in the anterior hemispheric sulci and low left sylvian fissure. Small volume intraventricular hemorrhage layering in the occipital horns of the lateral ventricles. No hydrocephalus or change in ventricular volume. There are bilateral predominantly low-density subdural collections with mild cortical mass effect. The subdural clot in the right frontal region is mildly increased from prior, interval hemorrhage versus redistribution, measuring 4 mm in thickness today. No parenchymal hemorrhage or swelling. No acute infarct. IMPRESSION: Compared to 02/14/2016, re- distributed or mildly increased right frontal subdural hematoma without increased mass effect. Elsewhere stable intracranial hemorrhage that is subdural, subarachnoid, and intraventricular. Electronically Signed   By: Monte Fantasia M.D.   On: 02/18/2016 20:38   Dg Chest Port 1 View  02/18/2016  CLINICAL DATA:  Fever ear. EXAM: PORTABLE CHEST 1 VIEW COMPARISON:  02/13/2016 FINDINGS: The heart size and mediastinal contours are within normal limits. Both lungs are clear. The visualized skeletal structures are unremarkable. IMPRESSION: Normal chest x-ray. Electronically Signed   By: Marijo Sanes M.D.   On: 02/18/2016 16:36    EKG: Independently reviewed. Vent. rate 54 BPM PR interval 130 ms QRS duration 92 ms QT/QTc 478/453 ms P-R-T axes 40 47 56 Sinus rhythm Normal ECG  Assessment/Plan Principal Problem:   SDH (subdural hematoma) (HCC) Admit to telemetry/inpatient. Analgesics as needed for headache. PT to evaluate and treat. Social services evaluation for possible placement.  Active Problems:   Fever No obvious source with normal white blood cell count and SDH. I will defer the use of antibiotics at this time Continue to monitor clinically. Check CBC in a.m.    Depression Continue Lexapro 20 mg by mouth daily.    Essential tremor Continue Primidone  100 mg by mouth 3 times a day Continue propranolol ER  60 mg by mouth daily.    History of Takotsubo Cardiomyopathy- ECHO 05/2015 w/EF 55% Continue beta blocker. Continue lisinopril 2.5 mg by mouth twice a day.    Type II diabetes mellitus (HCC) Carbohydrate modified diet. Continue Lantus 21 units SQ daily. CBG monitoring with regular insulin sliding scale.    Normocytic anemia Monitor hematocrit and hemoglobin.    DVT prophylaxis: SCDs. Code Status: Full code. Family Communication: His wife is present in the room. Disposition Plan: Admit for PT, social services and case management evaluation in a.m. Consults called:  Admission status: Inpatient/telemetry   Reubin Milan MD [facility] Pager [phone]  If 7PM-7AM, please contact night-coverage [email or web address] Password [facility]  02/18/2016, 10:27 PM

## 2016-02-18 NOTE — ED Notes (Signed)
Pt to ER via [facility] after being discharged last Friday from admission for subdural bleed. Pt since being discharged has continued to feel weak and fatigued. Pt per EMS has temperature of 102.2. Pt is alert and oriented x4 at present. Pt denies cough, reports some shortness of breath "at times."

## 2016-02-19 ENCOUNTER — Observation Stay (HOSPITAL_COMMUNITY): Payer: 59

## 2016-02-19 DIAGNOSIS — D649 Anemia, unspecified: Secondary | ICD-10-CM | POA: Diagnosis not present

## 2016-02-19 DIAGNOSIS — R509 Fever, unspecified: Secondary | ICD-10-CM | POA: Diagnosis not present

## 2016-02-19 DIAGNOSIS — Z8719 Personal history of other diseases of the digestive system: Secondary | ICD-10-CM | POA: Diagnosis not present

## 2016-02-19 DIAGNOSIS — F329 Major depressive disorder, single episode, unspecified: Secondary | ICD-10-CM | POA: Diagnosis not present

## 2016-02-19 DIAGNOSIS — G250 Essential tremor: Secondary | ICD-10-CM | POA: Diagnosis not present

## 2016-02-19 DIAGNOSIS — Z794 Long term (current) use of insulin: Secondary | ICD-10-CM

## 2016-02-19 DIAGNOSIS — G9349 Other encephalopathy: Secondary | ICD-10-CM | POA: Diagnosis not present

## 2016-02-19 DIAGNOSIS — F05 Delirium due to known physiological condition: Secondary | ICD-10-CM

## 2016-02-19 DIAGNOSIS — S065X0D Traumatic subdural hemorrhage without loss of consciousness, subsequent encounter: Secondary | ICD-10-CM | POA: Diagnosis not present

## 2016-02-19 DIAGNOSIS — D181 Lymphangioma, any site: Secondary | ICD-10-CM | POA: Diagnosis not present

## 2016-02-19 DIAGNOSIS — Z8546 Personal history of malignant neoplasm of prostate: Secondary | ICD-10-CM | POA: Diagnosis not present

## 2016-02-19 DIAGNOSIS — I6200 Nontraumatic subdural hemorrhage, unspecified: Secondary | ICD-10-CM | POA: Diagnosis not present

## 2016-02-19 DIAGNOSIS — M50323 Other cervical disc degeneration at C6-C7 level: Secondary | ICD-10-CM | POA: Diagnosis not present

## 2016-02-19 DIAGNOSIS — E118 Type 2 diabetes mellitus with unspecified complications: Secondary | ICD-10-CM | POA: Diagnosis not present

## 2016-02-19 DIAGNOSIS — M50322 Other cervical disc degeneration at C5-C6 level: Secondary | ICD-10-CM | POA: Diagnosis not present

## 2016-02-19 DIAGNOSIS — R410 Disorientation, unspecified: Secondary | ICD-10-CM | POA: Diagnosis not present

## 2016-02-19 DIAGNOSIS — Z87891 Personal history of nicotine dependence: Secondary | ICD-10-CM | POA: Diagnosis not present

## 2016-02-19 LAB — GLUCOSE, CAPILLARY
GLUCOSE-CAPILLARY: 270 mg/dL — AB (ref 65–99)
GLUCOSE-CAPILLARY: 280 mg/dL — AB (ref 65–99)
GLUCOSE-CAPILLARY: 97 mg/dL (ref 65–99)
Glucose-Capillary: 414 mg/dL — ABNORMAL HIGH (ref 65–99)

## 2016-02-19 LAB — CBC WITH DIFFERENTIAL/PLATELET
Basophils Absolute: 0 10*3/uL (ref 0.0–0.1)
Basophils Relative: 0 %
EOS PCT: 0 %
Eosinophils Absolute: 0 10*3/uL (ref 0.0–0.7)
HEMATOCRIT: 30.3 % — AB (ref 39.0–52.0)
Hemoglobin: 10.3 g/dL — ABNORMAL LOW (ref 13.0–17.0)
LYMPHS ABS: 1.2 10*3/uL (ref 0.7–4.0)
Lymphocytes Relative: 17 %
MCH: 31.3 pg (ref 26.0–34.0)
MCHC: 34 g/dL (ref 30.0–36.0)
MCV: 92.1 fL (ref 78.0–100.0)
MONO ABS: 0.5 10*3/uL (ref 0.1–1.0)
Monocytes Relative: 8 %
NEUTROS ABS: 5.1 10*3/uL (ref 1.7–7.7)
Neutrophils Relative %: 75 %
PLATELETS: 244 10*3/uL (ref 150–400)
RBC: 3.29 MIL/uL — AB (ref 4.22–5.81)
RDW: 13.7 % (ref 11.5–15.5)
WBC: 6.8 10*3/uL (ref 4.0–10.5)

## 2016-02-19 LAB — COMPREHENSIVE METABOLIC PANEL
ALT: 16 U/L — AB (ref 17–63)
AST: 26 U/L (ref 15–41)
Albumin: 2.9 g/dL — ABNORMAL LOW (ref 3.5–5.0)
Alkaline Phosphatase: 62 U/L (ref 38–126)
Anion gap: 9 (ref 5–15)
BILIRUBIN TOTAL: 0.8 mg/dL (ref 0.3–1.2)
BUN: 12 mg/dL (ref 6–20)
CHLORIDE: 97 mmol/L — AB (ref 101–111)
CO2: 25 mmol/L (ref 22–32)
CREATININE: 0.75 mg/dL (ref 0.61–1.24)
Calcium: 8.1 mg/dL — ABNORMAL LOW (ref 8.9–10.3)
Glucose, Bld: 269 mg/dL — ABNORMAL HIGH (ref 65–99)
Potassium: 4.1 mmol/L (ref 3.5–5.1)
Sodium: 131 mmol/L — ABNORMAL LOW (ref 135–145)
TOTAL PROTEIN: 6.1 g/dL — AB (ref 6.5–8.1)

## 2016-02-19 MED ORDER — INSULIN ASPART 100 UNIT/ML ~~LOC~~ SOLN
4.0000 [IU] | Freq: Three times a day (TID) | SUBCUTANEOUS | Status: DC
  Administered 2016-02-19 – 2016-02-20 (×3): 4 [IU] via SUBCUTANEOUS

## 2016-02-19 MED ORDER — INSULIN GLARGINE 100 UNIT/ML ~~LOC~~ SOLN
25.0000 [IU] | Freq: Every day | SUBCUTANEOUS | Status: DC
  Administered 2016-02-20: 25 [IU] via SUBCUTANEOUS
  Filled 2016-02-19: qty 0.25

## 2016-02-19 MED ORDER — INSULIN ASPART 100 UNIT/ML ~~LOC~~ SOLN
0.0000 [IU] | Freq: Three times a day (TID) | SUBCUTANEOUS | Status: DC
  Administered 2016-02-19: 15 [IU] via SUBCUTANEOUS
  Administered 2016-02-19: 8 [IU] via SUBCUTANEOUS
  Administered 2016-02-20: 11 [IU] via SUBCUTANEOUS
  Administered 2016-02-20 (×2): 2 [IU] via SUBCUTANEOUS

## 2016-02-19 MED FILL — HumaLOG 100 UNIT/ML SOLN: 100 | 90 days supply | Qty: 90 | Fill #3

## 2016-02-19 MED FILL — LANTUS 100 UNITS/ML VIAL: 100 | 40 days supply | Qty: 10 | Fill #1

## 2016-02-19 NOTE — Consult Note (Signed)
   [facility] CM Inpatient Consult   02/19/2016  Dale Morrow 1941-03-07 [id number]   Came by to see patient on behalf of [facility] Care Management program for [facility] Health employees/dependents with [facility] insurance. [name] has been followed in the past for DM Management with the [employer] [facility] program. He is currently off the unit. Wife not in room either. Will follow up at later time.  Marthenia Rolling, MSN-Ed, RN,BSN [facility] Liaison [phone]

## 2016-02-19 NOTE — Consult Note (Signed)
   [facility] CM Inpatient Consult   02/19/2016  Dale Morrow January 07, 1941 [id number]   [facility] Care Management follow up visit at bedside. Spoke with Dale Morrow. Confirmed that the plan is for [name] to go to SNF at discharge. Confirmed best contact number for her as [phone] home and cell number as [phone]. Appreciative of visit and follow up. [name] has some mild confusion currently. Will follow up post hospital discharge with [name] to see how things are coming along.   Marthenia Rolling, MSN-Ed, RN,BSN [facility] Liaison [phone]

## 2016-02-19 NOTE — Clinical Social Work Note (Signed)
Clinical Social Work Assessment  Patient Details  Name: Dale Morrow MRN: [id number] Date of Birth: [birth year]  Date of referral:  02/19/16               Reason for consult:  Facility Placement                Permission sought to share information with:  Facility Sport and exercise psychologist, Family Supports Permission granted to share information::  Yes, Verbal Permission Granted  Name::     Dale Morrow  Agency::  [facility] SNFs  Relationship::  Spouse  Contact Information:  [phone]  Housing/Transportation Living arrangements for the past 2 months:  [place] of Information:  Patient, Spouse Patient Interpreter Needed:  None Criminal Activity/Legal Involvement Pertinent to Current Situation/Hospitalization:  No - Comment as needed Significant Relationships:  Spouse Lives with:  Spouse Do you feel safe going back to the place where you live?  No Need for family participation in patient care:  Yes (Comment)  Care giving concerns:  CSW received referral for possible SNF placement at time of discharge. CSW met with patient and patient's wife at bedside regarding PT recommendation of SNF placement at time of discharge. Per patient's wife, patient's wife is currently unable to care for patient at their home given patient's current physical needs and fall risk. Patient and patient's wife expressed understanding of PT recommendation and are agreeable to SNF placement at time of discharge. CSW to continue to follow and assist with discharge planning needs.   Social Worker assessment / plan:  CSW spoke with patient and patient's wife concerning possibility of rehab at [facility] before returning home.  Employment status:  Retired Health visitor, Managed Care PT Recommendations:  Not assessed at this time Information / Referral to community resources:  [place]  Patient/Family's Response to care:  Patient and patient's wife recognize need for rehab  before returning home and are agreeable to a SNF in [place]. Patient reported preference for [facility].  Patient/Family's Understanding of and Emotional Response to Diagnosis, Current Treatment, and Prognosis:  Patient is realistic regarding therapy needs. No questions/concerns about plan or treatment.    Emotional Assessment Appearance:  Appears stated age Attitude/Demeanor/Rapport:   (Appropriate) Affect (typically observed):  Accepting, Appropriate Orientation:  Oriented to Self, Oriented to Place, Oriented to  Time, Oriented to Situation Alcohol / Substance use:  Not Applicable Psych involvement (Current and /or in the community):  No (Comment)  Discharge Needs  Concerns to be addressed:  Care Coordination Readmission within the last 30 days:  No Current discharge risk:  None Barriers to Discharge:  Continued Medical Work up   [employer], [place] 02/19/2016, 1:28 PM

## 2016-02-19 NOTE — Clinical Social Work Placement (Signed)
   CLINICAL SOCIAL WORK PLACEMENT  NOTE  Date:  02/19/2016  Patient Details  Name: Dale Morrow MRN: [id number] Date of Birth: [birth year]  Clinical Social Work is seeking post-discharge placement for this patient at the [place] level of care (*CSW will initial, date and re-position this form in  chart as items are completed):      Patient/family provided with [place] Work Department's list of facilities offering this level of care within the geographic area requested by the patient (or if unable, by the patient's family).      Patient/family informed of their freedom to choose among providers that offer the needed level of care, that participate in Medicare, Medicaid or managed care program needed by the patient, have an available bed and are willing to accept the patient.      Patient/family informed of [facility]'s ownership interest in [facility] and [facility], as well as of the fact that they are under no obligation to receive care at these facilities.  PASRR submitted to EDS on       PASRR number received on       Existing PASRR number confirmed on 02/19/16     FL2 transmitted to all facilities in geographic area requested by pt/family on 02/19/16     FL2 transmitted to all facilities within larger geographic area on       Patient informed that his/her managed care company has contracts with or will negotiate with certain facilities, including the following:            Patient/family informed of bed offers received.  Patient chooses bed at       Physician recommends and patient chooses bed at      Patient to be transferred to   on  .  Patient to be transferred to facility by       Patient family notified on   of transfer.  Name of family member notified:        PHYSICIAN Please sign FL2     Additional Comment:    _______________________________________________ Benard Halsted, [place] 02/19/2016, 1:29 PM

## 2016-02-19 NOTE — Progress Notes (Signed)
PROGRESS NOTE        PATIENT DETAILS Name: Dale Morrow Age: 75 y.o. Sex: male Date of Birth: 1941-01-27 Admit Date: 02/18/2016 Admitting Physician Reubin Milan, MD JY:[id number], Valla Leaver, MD Outpatient Specialists:Dr Krista Blue, Dr Dorris Carnes, Dr. Buddy Duty  Brief Narrative: Patient is a 75 y.o. male with a hx of L de Quervain's tenosynovitis, resting tremor, prostate cancer, diabetes, Bell's palsy, nonischemic cardiomyopathy who was just discharged from the trauma services after he sustained a syncopal episode and traumatic brain injury and left eye injury. He was brought back to the hospital for fever, worsening weakness.  Subjective: Seems to be somewhat confused  Assessment/Plan: Principal Problem: Low Grade Fever: No foci of infection apparent-UA/chest x-ray negative. Does not appear toxic, no leukocytosis. Could be central fever-from blood in the brain. Await blood culture, continue to monitor off antibiotics.  Active Problems: Mild Encephalopathy:?etiology-has low-grade inferior-but no obvious infection. Continue supportive care, check EEG, do not think that underlying SDH/SAH plus bilateral IVH can account for this. Will touch base with neurosurgery.  TBI w/ multiple areas of SAH, SDH plus bilateral IVH: CT head on 5/7 appears similar to his most recent CT head. Will touch base with neurosurgery, but doubt any intervention is required at this time.   Left eye injury with laceration s/p simple closure with 5-0 vicryl sutures of his left eye lacteration: Ophthalmology was following during his most recent hospital stay-follow with Dr Katy Fitch  Recent syncope: Causing TBI with SAH/SDH and bilateral IVH.Although he has a history of takosubo's Cardiomyopathy-EF has normalized. Telemetry continues to be unremarkable.  Depression: Continue Lexapro  History of essential tremor: Continue propanolol and pemoline  Hypertension: Cautiously continue with lisinopril.  Type  2 diabetes: Previously on insulin pump-CBGs currently uncontrolled-increase Lantus to 25 units, continue SSI.  Deconditioning: PT/OT eval. Likely will require SNF placement.  DVT Prophylaxis: SCD's  Code Status: Full code   Family Communication: None at bedside  Disposition Plan: Remain likely SNF on discharge  Antimicrobial agents: None  Procedures: None  CONSULTS:  None  Time spent: 25 minutes-Greater than 50% of this time was spent in counseling, explanation of diagnosis, planning of further management, and coordination of care.  MEDICATIONS: Anti-infectives    None      Scheduled Meds: . dorzolamide-timolol  1 drop Left Eye BID  . escitalopram  20 mg Oral Daily  . insulin aspart  0-9 Units Subcutaneous TID WC  . insulin glargine  21 Units Subcutaneous Daily  . lisinopril  2.5 mg Oral BID  . pantoprazole (PROTONIX) IV  40 mg Intravenous QHS  . polyethylene glycol  17 g Oral QHS  . pregabalin  100 mg Oral BID  . primidone  100 mg Oral TID  . propranolol ER  60 mg Oral Daily  . sodium chloride flush  3 mL Intravenous Q12H   Continuous Infusions:  PRN Meds:.acetaminophen **OR** acetaminophen, ondansetron **OR** ondansetron (ZOFRAN) IV, sodium phosphate   PHYSICAL EXAM: Vital signs: Filed Vitals:   02/19/16 0100 02/19/16 0200 02/19/16 0527 02/19/16 0919  BP: 154/61  142/62 127/61  Pulse: 52  57 53  Temp: 98.1 F (36.7 C)  98.5 F (36.9 C) 98.8 F (37.1 C)  TempSrc: Oral  Oral Oral  Resp: 16  16 16   Height:  5\' 7"  (1.702 m)    Weight:  77.111 kg (170 lb)  SpO2: 98%  100% 96%   Filed Weights   02/19/16 0200  Weight: 77.111 kg (170 lb)   Body mass index is 26.62 kg/(m^2).   Gen Exam: Awake and alert-Somewhat confused at times. Speech clear. (See picture below) Neck: Supple, No JVD.   Chest: B/L Clear.   CVS: S1 S2 Regular, no murmurs.  Abdomen: soft, BS +, non tender, non distended.  Extremities: no edema, lower extremities warm to  touch. Neurologic: Non Focal.   Skin: No Rash or lesions   Wounds: N/A.      LABORATORY DATA: CBC:  Recent Labs Lab 02/13/16 0322 02/13/16 0821 02/14/16 0350 02/16/16 1047 02/18/16 1524 02/19/16 0550  WBC 11.1* 8.0 7.1 6.8 7.9 6.8  NEUTROABS 9.4* 6.5  --   --  5.9 5.1  HGB 11.7* 10.4* 10.0* 10.9* 10.8* 10.3*  HCT 35.9* 31.3* 29.9* 32.3* 32.4* 30.3*  MCV 94.5 92.3 94.0 91.2 91.0 92.1  PLT 202 201 185 208 255 [id number]    Basic Metabolic Panel:  Recent Labs Lab 02/13/16 0821 02/14/16 0350 02/16/16 1047 02/18/16 1524 02/19/16 0550  NA 139 138 138 133* 131*  K 4.7 3.7 3.8 3.9 4.1  CL 104 105 103 95* 97*  CO2 20* 23 26 26 25   GLUCOSE 355* 191* 100* 193* 269*  BUN 22* 11 11 13 12   CREATININE 1.10 0.79 0.65 0.83 0.75  CALCIUM 8.4* 7.9* 8.7* 8.7* 8.1*    GFR: Estimated Creatinine Clearance: 75.7 mL/min (by C-G formula based on Cr of 0.75).  Liver Function Tests:  Recent Labs Lab 02/13/16 0322 02/18/16 1524 02/19/16 0550  AST 53* 29 26  ALT 34 17 16*  ALKPHOS 84 65 62  BILITOT 1.2 0.6 0.8  PROT 7.3 6.6 6.1*  ALBUMIN 4.0 3.1* 2.9*   No results for input(s): LIPASE, AMYLASE in the last 168 hours. No results for input(s): AMMONIA in the last 168 hours.  Coagulation Profile:  Recent Labs Lab 02/13/16 0821  INR 1.23    Cardiac Enzymes:  Recent Labs Lab 02/18/16 1524  TROPONINI 0.03    BNP (last 3 results) No results for input(s): PROBNP in the last 8760 hours.  HbA1C: No results for input(s): HGBA1C in the last 72 hours.  CBG:  Recent Labs Lab 02/16/16 0637 02/16/16 0805 02/16/16 1145 02/16/16 1627 02/19/16 0621  GLUCAP 214* 141* 87 238* 270*    Lipid Profile: No results for input(s): CHOL, HDL, LDLCALC, TRIG, CHOLHDL, LDLDIRECT in the last 72 hours.  Thyroid Function Tests: No results for input(s): TSH, T4TOTAL, FREET4, T3FREE, THYROIDAB in the last 72 hours.  Anemia Panel: No results for input(s): VITAMINB12, FOLATE, FERRITIN,  TIBC, IRON, RETICCTPCT in the last 72 hours.  Urine analysis:    Component Value Date/Time   COLORURINE YELLOW 02/18/2016 1826   APPEARANCEUR CLEAR 02/18/2016 1826   LABSPEC 1.027 02/18/2016 1826   PHURINE 6.5 02/18/2016 1826   GLUCOSEU 250* 02/18/2016 1826   HGBUR NEGATIVE 02/18/2016 1826   BILIRUBINUR NEGATIVE 02/18/2016 1826   KETONESUR 15* 02/18/2016 1826   PROTEINUR NEGATIVE 02/18/2016 1826   UROBILINOGEN 0.2 04/19/2015 0210   NITRITE NEGATIVE 02/18/2016 1826   LEUKOCYTESUR NEGATIVE 02/18/2016 1826    Sepsis Labs: Lactic Acid, Venous    Component Value Date/Time   LATICACIDVEN 1.93 02/18/2016 1535    MICROBIOLOGY: Recent Results (from the past 240 hour(s))  Urine culture     Status: Abnormal   Collection Time: 02/13/16 10:45 AM  Result Value Ref Range Status   Specimen  Description URINE, RANDOM  Final   Special Requests NONE  Final   Culture MULTIPLE SPECIES PRESENT, SUGGEST RECOLLECTION (A)  Final   Report Status 02/14/2016 FINAL  Final  MRSA PCR Screening     Status: None   Collection Time: 02/13/16  5:05 PM  Result Value Ref Range Status   MRSA by PCR NEGATIVE NEGATIVE Final    Comment:        The GeneXpert MRSA Assay (FDA approved for NASAL specimens only), is one component of a comprehensive MRSA colonization surveillance program. It is not intended to diagnose MRSA infection nor to guide or monitor treatment for MRSA infections.   Culture, Urine     Status: Abnormal   Collection Time: 02/16/16 12:18 PM  Result Value Ref Range Status   Specimen Description URINE, CLEAN CATCH  Final   Special Requests NONE  Final   Culture 6,000 COLONIES/mL INSIGNIFICANT GROWTH (A)  Final   Report Status 02/17/2016 FINAL  Final    RADIOLOGY STUDIES/RESULTS: Dg Chest 1 View  02/13/2016  CLINICAL DATA:  Increasing leg pain after a fall yesterday. EXAM: CHEST 1 VIEW COMPARISON:  04/21/2015 FINDINGS: Shallow inspiration. Normal heart size and pulmonary vascularity. No  focal airspace disease or consolidation in the lungs. No blunting of costophrenic angles. No pneumothorax. Mediastinal contours appear intact. IMPRESSION: No active disease. Electronically Signed   By: Lucienne Capers M.D.   On: 02/13/2016 04:44   Ct Head Wo Contrast  02/18/2016  CLINICAL DATA:  Altered mental status. Recent fall and subdural hematoma. Subsequent encounter EXAM: CT HEAD WITHOUT CONTRAST TECHNIQUE: Contiguous axial images were obtained from the base of the skull through the vertex without intravenous contrast. COMPARISON:  02/14/2016 FINDINGS: Skull and Sinuses:Swelling around the left face and frontal scalp is diminished. Negative for fracture or hemo sinus. Visualized orbits: Bilateral cataract resection.  No acute finding. Brain: Very similar volume and extent of intracranial hemorrhage as seen previously. There is scattered traumatic pattern subarachnoid blood greatest in the anterior hemispheric sulci and low left sylvian fissure. Small volume intraventricular hemorrhage layering in the occipital horns of the lateral ventricles. No hydrocephalus or change in ventricular volume. There are bilateral predominantly low-density subdural collections with mild cortical mass effect. The subdural clot in the right frontal region is mildly increased from prior, interval hemorrhage versus redistribution, measuring 4 mm in thickness today. No parenchymal hemorrhage or swelling. No acute infarct. IMPRESSION: Compared to 02/14/2016, re- distributed or mildly increased right frontal subdural hematoma without increased mass effect. Elsewhere stable intracranial hemorrhage that is subdural, subarachnoid, and intraventricular. Electronically Signed   By: Monte Fantasia M.D.   On: 02/18/2016 20:38   Ct Head Wo Contrast  02/14/2016  CLINICAL DATA:  Follow-up traumatic brain injury. EXAM: CT HEAD WITHOUT CONTRAST TECHNIQUE: Contiguous axial images were obtained from the base of the skull through the vertex  without intravenous contrast. COMPARISON:  02/13/2016 FINDINGS: Multifocal acute intracranial hemorrhage again demonstrated. Acute subdural hemorrhage demonstrated focally along the anterior falx as well as in the left sylvian fissure region. Multifocal areas of intraparenchymal hemorrhage demonstrated predominate along the subcortical or cortical surfaces in the frontal lobes bilaterally along the convexity, and in the left parietal lobe. Mild intraventricular hemorrhage in the posterior horns of the lateral ventricles bilaterally. No significant progression in these areas since the previous study. Subdural Hemorrhage along the right anterior frontal region and subarachnoid hemorrhage along the parafalcine sulci is less distinct than on the previous study. mild cerebral atrophy. Mild ventricular  dilatation is likely due to central atrophy. No mass effect or midline shift. Gray-white matter junctions are distinct. Large subcutaneous scalp hematoma in the left anterior frontal region extending to the temporal region and left side of the face is again demonstrated, slightly smaller than previous study. IMPRESSION: Multifocal acute intracranial hemorrhage, including subdural, subarachnoid, intraparenchymal, intraventricular hemorrhage. No progression since previous study. Some areas are becoming less distinct. Electronically Signed   By: Lucienne Capers M.D.   On: 02/14/2016 05:49   Ct Head Wo Contrast  02/13/2016  CLINICAL DATA:  Initial evaluation for acute trauma, fall. EXAM: CT HEAD WITHOUT CONTRAST CT MAXILLOFACIAL WITHOUT CONTRAST CT CERVICAL SPINE WITHOUT CONTRAST TECHNIQUE: Multidetector CT imaging of the head, cervical spine, and maxillofacial structures were performed using the standard protocol without intravenous contrast. Multiplanar CT image reconstructions of the cervical spine and maxillofacial structures were also generated. COMPARISON:  Prior study from 04/19/2015. FINDINGS: CT HEAD FINDINGS Large  left periorbital and frontal scalp contusion. Globes intact. No retro-orbital hematoma or other pathology. No calvarial fracture.  Mastoid air cells are clear. Age-related cerebral atrophy with chronic microvascular ischemic disease present. No acute large vessel territory infarct. Acute subdural/ extra-axial hemorrhage overlies the right frontal convexity measuring up to 11 mm. Scattered subdural blood along the falx with a left parafalcine subdural hematoma measuring up to 4 mm. There is associated scattered acute subarachnoid hemorrhage within the right frontal lobe and parasagittal left frontal parietal lobes. Subarachnoid blood along the superior aspect of the left sylvian fissure. 13 mm contusion at the left anterior temporal pole. Small amount of hemorrhage along the septum pellucidum measures up to 9 mm. Acute subarachnoid hemorrhage within the posterior right temporal lobe. No midline shift. No hydrocephalus. Small amount of layering intraventricular hemorrhage within the occipital horns bilaterally. Basilar cisterns remain patent. Superimposed chronic appearing bilateral subdural hygroma is measure up to 5-6 mm bilaterally. CT MAXILLOFACIAL FINDINGS Large left forehead/left periorbital and facial contusion. Mild left-sided enophthalmos. The left globe itself appears intact without retro-orbital hematoma or other pathology. Right globe intact without retro-orbital pathology. Bony orbits intact. No orbital floor fracture. Lamina papyracea intact. Orbital roofs intact. Zygomatic arches intact. No acute maxillary fracture. Pterygoid plates intact. No acute nasal bone fracture. Mandible intact. Mandibular condyles normally situated within the temporomandibular fossa. Paranasal and this is are largely clear. CT CERVICAL SPINE FINDINGS Trace anterolisthesis of C4 on C5, likely chronic. The vertebral bodies are otherwise normally aligned with preservation of the normal cervical lordosis. Vertebral body heights are  preserved. Normal C1-2 articulations are intact. No prevertebral soft tissue swelling. No acute fracture or listhesis. Moderate degenerative spondylolysis at C5-6 and C6-7. Scatter multilevel facet arthrosis noted. Visualized soft tissues of the neck demonstrate no acute abnormality. Scattered vascular calcifications about the carotid bifurcations. Visualized lung apices are clear without evidence of apical pneumothorax. IMPRESSION: CT HEAD: 1. Acute traumatic subarachnoid hemorrhage involving both cerebral hemispheres as above, with additional 13 mm parenchymal contusion at the left temporal pole. 2. Small volume acute subdural hemorrhage overlying the right frontal convexity and falx. 3. Small volume acute intraventricular hemorrhage within the occipital horns of both lateral ventricles. This may be related to redistribution. No hydrocephalus or midline shift. 4. Large left frontal/periorbital scalp contusion. No calvarial fracture. CT MAXILLOFACIAL: 1. No acute maxillofacial fracture. 2. Large left forehead/periorbital and facial contusion. There is associated left enophthalmos. Left globe itself appears grossly intact without retro-orbital hematoma or other pathology. CT CERVICAL SPINE: No acute traumatic injury within the cervical spine.  Critical Value/emergent results were called by telephone at the time of interpretation on 02/13/2016 at 5:21 am to Dr. Veryl Speak , who verbally acknowledged these results. Electronically Signed   By: Jeannine Boga M.D.   On: 02/13/2016 05:23   Ct Cervical Spine Wo Contrast  02/13/2016  CLINICAL DATA:  Initial evaluation for acute trauma, fall. EXAM: CT HEAD WITHOUT CONTRAST CT MAXILLOFACIAL WITHOUT CONTRAST CT CERVICAL SPINE WITHOUT CONTRAST TECHNIQUE: Multidetector CT imaging of the head, cervical spine, and maxillofacial structures were performed using the standard protocol without intravenous contrast. Multiplanar CT image reconstructions of the cervical spine and  maxillofacial structures were also generated. COMPARISON:  Prior study from 04/19/2015. FINDINGS: CT HEAD FINDINGS Large left periorbital and frontal scalp contusion. Globes intact. No retro-orbital hematoma or other pathology. No calvarial fracture.  Mastoid air cells are clear. Age-related cerebral atrophy with chronic microvascular ischemic disease present. No acute large vessel territory infarct. Acute subdural/ extra-axial hemorrhage overlies the right frontal convexity measuring up to 11 mm. Scattered subdural blood along the falx with a left parafalcine subdural hematoma measuring up to 4 mm. There is associated scattered acute subarachnoid hemorrhage within the right frontal lobe and parasagittal left frontal parietal lobes. Subarachnoid blood along the superior aspect of the left sylvian fissure. 13 mm contusion at the left anterior temporal pole. Small amount of hemorrhage along the septum pellucidum measures up to 9 mm. Acute subarachnoid hemorrhage within the posterior right temporal lobe. No midline shift. No hydrocephalus. Small amount of layering intraventricular hemorrhage within the occipital horns bilaterally. Basilar cisterns remain patent. Superimposed chronic appearing bilateral subdural hygroma is measure up to 5-6 mm bilaterally. CT MAXILLOFACIAL FINDINGS Large left forehead/left periorbital and facial contusion. Mild left-sided enophthalmos. The left globe itself appears intact without retro-orbital hematoma or other pathology. Right globe intact without retro-orbital pathology. Bony orbits intact. No orbital floor fracture. Lamina papyracea intact. Orbital roofs intact. Zygomatic arches intact. No acute maxillary fracture. Pterygoid plates intact. No acute nasal bone fracture. Mandible intact. Mandibular condyles normally situated within the temporomandibular fossa. Paranasal and this is are largely clear. CT CERVICAL SPINE FINDINGS Trace anterolisthesis of C4 on C5, likely chronic. The  vertebral bodies are otherwise normally aligned with preservation of the normal cervical lordosis. Vertebral body heights are preserved. Normal C1-2 articulations are intact. No prevertebral soft tissue swelling. No acute fracture or listhesis. Moderate degenerative spondylolysis at C5-6 and C6-7. Scatter multilevel facet arthrosis noted. Visualized soft tissues of the neck demonstrate no acute abnormality. Scattered vascular calcifications about the carotid bifurcations. Visualized lung apices are clear without evidence of apical pneumothorax. IMPRESSION: CT HEAD: 1. Acute traumatic subarachnoid hemorrhage involving both cerebral hemispheres as above, with additional 13 mm parenchymal contusion at the left temporal pole. 2. Small volume acute subdural hemorrhage overlying the right frontal convexity and falx. 3. Small volume acute intraventricular hemorrhage within the occipital horns of both lateral ventricles. This may be related to redistribution. No hydrocephalus or midline shift. 4. Large left frontal/periorbital scalp contusion. No calvarial fracture. CT MAXILLOFACIAL: 1. No acute maxillofacial fracture. 2. Large left forehead/periorbital and facial contusion. There is associated left enophthalmos. Left globe itself appears grossly intact without retro-orbital hematoma or other pathology. CT CERVICAL SPINE: No acute traumatic injury within the cervical spine. Critical Value/emergent results were called by telephone at the time of interpretation on 02/13/2016 at 5:21 am to Dr. Veryl Speak , who verbally acknowledged these results. Electronically Signed   By: Jeannine Boga M.D.   On: 02/13/2016 05:23  Dg Chest Port 1 View  02/18/2016  CLINICAL DATA:  Fever ear. EXAM: PORTABLE CHEST 1 VIEW COMPARISON:  02/13/2016 FINDINGS: The heart size and mediastinal contours are within normal limits. Both lungs are clear. The visualized skeletal structures are unremarkable. IMPRESSION: Normal chest x-ray.  Electronically Signed   By: Marijo Sanes M.D.   On: 02/18/2016 16:36   Dg Hip Unilat With Pelvis 2-3 Views Left  02/13/2016  CLINICAL DATA:  Increasing pain after a fall. EXAM: DG HIP (WITH OR WITHOUT PELVIS) 2-3V LEFT COMPARISON:  None. FINDINGS: Pelvis and left hip appear intact. No evidence of acute fracture or dislocation. No focal bone lesion or bone destruction. Vascular calcifications. Surgical clips over the symphysis pubis. IMPRESSION: No acute bony abnormalities. Electronically Signed   By: Lucienne Capers M.D.   On: 02/13/2016 04:45   Dg Femur Min 2 Views Left  02/13/2016  CLINICAL DATA:  Increasing pain and difficulty weight-bearing after a fall late afternoon of 02/12/2016. EXAM: LEFT FEMUR 2 VIEWS COMPARISON:  None. FINDINGS: Left total knee arthroplasty. No evidence of acute fracture or dislocation. No focal bone lesion or bone destruction. Soft tissues are unremarkable. IMPRESSION: No acute bony abnormalities. Electronically Signed   By: Lucienne Capers M.D.   On: 02/13/2016 04:44   Ct Maxillofacial Wo Cm  02/13/2016  CLINICAL DATA:  Initial evaluation for acute trauma, fall. EXAM: CT HEAD WITHOUT CONTRAST CT MAXILLOFACIAL WITHOUT CONTRAST CT CERVICAL SPINE WITHOUT CONTRAST TECHNIQUE: Multidetector CT imaging of the head, cervical spine, and maxillofacial structures were performed using the standard protocol without intravenous contrast. Multiplanar CT image reconstructions of the cervical spine and maxillofacial structures were also generated. COMPARISON:  Prior study from 04/19/2015. FINDINGS: CT HEAD FINDINGS Large left periorbital and frontal scalp contusion. Globes intact. No retro-orbital hematoma or other pathology. No calvarial fracture.  Mastoid air cells are clear. Age-related cerebral atrophy with chronic microvascular ischemic disease present. No acute large vessel territory infarct. Acute subdural/ extra-axial hemorrhage overlies the right frontal convexity measuring up to 11  mm. Scattered subdural blood along the falx with a left parafalcine subdural hematoma measuring up to 4 mm. There is associated scattered acute subarachnoid hemorrhage within the right frontal lobe and parasagittal left frontal parietal lobes. Subarachnoid blood along the superior aspect of the left sylvian fissure. 13 mm contusion at the left anterior temporal pole. Small amount of hemorrhage along the septum pellucidum measures up to 9 mm. Acute subarachnoid hemorrhage within the posterior right temporal lobe. No midline shift. No hydrocephalus. Small amount of layering intraventricular hemorrhage within the occipital horns bilaterally. Basilar cisterns remain patent. Superimposed chronic appearing bilateral subdural hygroma is measure up to 5-6 mm bilaterally. CT MAXILLOFACIAL FINDINGS Large left forehead/left periorbital and facial contusion. Mild left-sided enophthalmos. The left globe itself appears intact without retro-orbital hematoma or other pathology. Right globe intact without retro-orbital pathology. Bony orbits intact. No orbital floor fracture. Lamina papyracea intact. Orbital roofs intact. Zygomatic arches intact. No acute maxillary fracture. Pterygoid plates intact. No acute nasal bone fracture. Mandible intact. Mandibular condyles normally situated within the temporomandibular fossa. Paranasal and this is are largely clear. CT CERVICAL SPINE FINDINGS Trace anterolisthesis of C4 on C5, likely chronic. The vertebral bodies are otherwise normally aligned with preservation of the normal cervical lordosis. Vertebral body heights are preserved. Normal C1-2 articulations are intact. No prevertebral soft tissue swelling. No acute fracture or listhesis. Moderate degenerative spondylolysis at C5-6 and C6-7. Scatter multilevel facet arthrosis noted. Visualized soft tissues of the neck demonstrate  no acute abnormality. Scattered vascular calcifications about the carotid bifurcations. Visualized lung apices are  clear without evidence of apical pneumothorax. IMPRESSION: CT HEAD: 1. Acute traumatic subarachnoid hemorrhage involving both cerebral hemispheres as above, with additional 13 mm parenchymal contusion at the left temporal pole. 2. Small volume acute subdural hemorrhage overlying the right frontal convexity and falx. 3. Small volume acute intraventricular hemorrhage within the occipital horns of both lateral ventricles. This may be related to redistribution. No hydrocephalus or midline shift. 4. Large left frontal/periorbital scalp contusion. No calvarial fracture. CT MAXILLOFACIAL: 1. No acute maxillofacial fracture. 2. Large left forehead/periorbital and facial contusion. There is associated left enophthalmos. Left globe itself appears grossly intact without retro-orbital hematoma or other pathology. CT CERVICAL SPINE: No acute traumatic injury within the cervical spine. Critical Value/emergent results were called by telephone at the time of interpretation on 02/13/2016 at 5:21 am to Dr. Veryl Speak , who verbally acknowledged these results. Electronically Signed   By: Jeannine Boga M.D.   On: 02/13/2016 05:23     LOS: 1 day   Oren Binet, MD  [facility] Pager:[phone]  If 7PM-7AM, please contact night-coverage [email or web address] Password [email or web address] 02/19/2016, 11:09 AM

## 2016-02-19 NOTE — Procedures (Signed)
History: 75 year old male with confusion  Sedation: None  Technique: This is a 21 channel routine scalp EEG performed at the bedside with bipolar and monopolar montages arranged in accordance to the international 10/20 system of electrode placement. One channel was dedicated to EKG recording.    Background: There is some irregular delta diffusely throughout the recording. In addition, there is a posterior dominant rhythm which achieves a maximal frequency of 8 Hz. Sleep is recorded with symmetrically appearing structures.  Photic stimulation: Physiologic driving is not performed  EEG Abnormalities: 1) generalized irregular slow activity  Clinical Interpretation: This EEG is consistent with a mild generalized non-specific cerebral dysfunction(encephalopathy). There was no seizure or seizure predisposition recorded on this study. Please note that a normal EEG does not preclude the possibility of epilepsy.   Roland Rack, MD [facility] [phone]  If 7pm- 7am, please page neurology on call as listed in [place].

## 2016-02-19 NOTE — Care Management Note (Signed)
Case Management Note  Patient Details  Name: Dale Morrow MRN: [id number] Date of Birth: 1941-09-25  Subjective/Objective:                    Action/Plan: Patient presented with increased fatigue and weakness. Recently discharged home with wife on 02/16/16.  CM will follow for discharge needs pending PT/OT evals and physician orders.  Expected Discharge Date:                  Expected Discharge Plan:     In-House Referral:     Discharge planning Services     Post Acute Care Choice:    Choice offered to:     DME Arranged:    DME Agency:     HH Arranged:    HH Agency:     Status of Service:  In process, will continue to follow  Medicare Important Message Given:    Date Medicare IM Given:    Medicare IM give by:    Date Additional Medicare IM Given:    Additional Medicare Important Message give by:     If discussed at [place] of Stay Meetings, dates discussed:    Additional CommentsRolm Baptise, RN 02/19/2016, 12:06 PM [phone]

## 2016-02-19 NOTE — Progress Notes (Signed)
EEG completed; results pending.    

## 2016-02-19 NOTE — Progress Notes (Signed)
Spoke briefly with patient's wife. She was administering SQ insulin at home instead of using insulin pump.  She states that blood sugars were mostly in the 200-300 range.  Lantus has been increased today.  Patient did get dose of Lantus on 02/18/16.  Will follow.  Note plans for patient to have rehab after d/c at SNF.  Thanks, Adah Perl, RN, BC-ADM Inpatient Diabetes Coordinator Pager [phone]

## 2016-02-19 NOTE — Evaluation (Signed)
Physical Therapy Evaluation Patient Details Name: Dale Morrow MRN: [id number] DOB: 1941/09/21 Today's Date: 02/19/2016   History of Present Illness  Dale Morrow is a 75 y.o. male with medical history significant of osteoarthritis, depression, essential tremor, prostate CVA, diabetes mellitus, nonischemic cardiomyopathy, chronic systolic CHF, GERD who was recently admitted and discharged (05/02 to 02/16/2016) secondary to unwitnessed fall with CT showing multifocal acute intracranial hemorrhage (including subdural, subarachnoid, intraparenchymal, intraventricular hemorrhage) primarily Lt frontal region. Wife brought to ED 02/18/16 due to decline in function with repeat imaging showed mildly increased versus redistribution of subdural bleed.    Clinical Impression  Pt admitted with above diagnosis. During evaluation, patient varied from min-mod up to total assist (pt with episode of full body rigidity/extension and retropulsion in standing. Episode lasted ~5 seconds and pt able to talk during episode). RN made aware and stated this had also happened earlier when nursing was transferring bed to [facility]. Pt currently with functional limitations due to the deficits listed below (see PT Problem List).  Pt will benefit from skilled PT to increase their independence and safety with mobility to allow discharge to the venue listed below.       Follow Up Recommendations SNF;Supervision/Assistance - 24 hour (wife's choice (appears needs longer than 2 weeks of therapy))    Equipment Recommendations  None recommended by PT    Recommendations for Other Services OT consult     Precautions / Restrictions Precautions Precautions: Fall Restrictions Weight Bearing Restrictions: No      Mobility  Bed Mobility Overal bed mobility: Needs Assistance Bed Mobility: Sit to Supine;Supine to Sit     Supine to sit: Min guard Sit to supine: Min guard   General bed mobility comments: with rail, HOB flat; incr  effort and vc for sequence  Transfers Overall transfer level: Needs assistance Equipment used: Rolling walker (2 wheeled) Transfers: Sit to/from Stand Sit to Stand: Mod assist;Total assist         General transfer comment: from EOB; pt required vc for hand placement (even with questioning cue); when became rigid, required total assist to return to sitting  Ambulation/Gait             General Gait Details: unable  Stairs            Wheelchair Mobility    Modified Rankin (Stroke Patients Only)       Balance   Sitting-balance support: No upper extremity supported;Feet unsupported Sitting balance-Leahy Scale: Good     Standing balance support: Bilateral upper extremity supported Standing balance-Leahy Scale: Zero Standing balance comment: during episode of full body extension                             Pertinent Vitals/Pain Pain Assessment: Faces Faces Pain Scale: Hurts even more Pain Location: indicated posterior thighs Pain Descriptors / Indicators: Grimacing Pain Intervention(s): Limited activity within patient's tolerance;Monitored during session;Repositioned    Home Living Family/patient expects to be discharged to:: Skilled nursing facility Living Arrangements: Spouse/significant other                    Prior Function Level of Independence: Needs assistance   Gait / Transfers Assistance Needed: prior to fall, independent; progressively declined at home to ?mod assist pivot transfers with wife           Hand Dominance        Extremity/Trunk Assessment   Upper Extremity Assessment: Defer  to OT evaluation           Lower Extremity Assessment: Difficult to assess due to impaired cognition;RLE deficits/detail;LLE deficits/detail RLE Deficits / Details: lifted off/back on to bed without assist; assist to stand; episode of rigidity bil legs with retropulsion (arms also became rigid in extension) LLE Deficits / Details:  lifted off/back on to bed without assist; assist to stand; episode of rigidity bil legs with retropulsion (arms also became rigid in extension)  Cervical / Trunk Assessment: Other exceptions  Communication      Cognition Arousal/Alertness: Lethargic (frequent eye closing) Behavior During Therapy: Flat affect;Anxious Overall Cognitive Status: Impaired/Different from baseline Area of Impairment: Memory;Following commands;Problem solving;Attention;Safety/judgement;[place] level   Current Attention Level: Sustained (vs due to fatigue) Memory: Decreased short-term memory Following Commands: Follows one step commands with increased time Safety/Judgement: Decreased awareness of safety;Decreased awareness of deficits   Problem Solving: Slow processing;Decreased initiation;Requires verbal cues;Requires tactile cues General Comments: Patient speaks softly and frequently had to request he repeat himself    General Comments General comments (skin integrity, edema, etc.): Wife present    Exercises        Assessment/Plan    PT Assessment Patient needs continued PT services  PT Diagnosis Difficulty walking;Altered mental status   PT Problem List Decreased activity tolerance;Decreased balance;Decreased mobility;Decreased coordination;Decreased cognition;Decreased knowledge of use of DME;Decreased safety awareness;Pain;Decreased knowledge of precautions;Impaired tone  PT Treatment Interventions DME instruction;Gait training;Functional mobility training;Therapeutic activities;Therapeutic exercise;Balance training;Patient/family education;Neuromuscular re-education;Cognitive remediation   PT Goals (Current goals can be found in the Care Plan section) Acute Rehab PT Goals Patient Stated Goal: to lie down PT Goal Formulation: With patient/family Time For Goal Achievement: 03/04/16 Potential to Achieve Goals: Good    Frequency Min 2X/week   Barriers to discharge Decreased caregiver support wife  cannot manage patient in current condition    Co-evaluation               End of Session Equipment Utilized During Treatment: Gait belt Activity Tolerance: Treatment limited secondary to medical complications (Comment) (episode of full body extension) Patient left: in bed;with call bell/phone within reach;with bed alarm set;with nursing/sitter in room;with family/visitor present;with SCD's reapplied Nurse Communication: Mobility status;Other (comment) (episode of rigidity)    Functional Assessment Tool Used: clinical judgement Functional Limitation: Mobility: Walking and moving around Mobility: Walking and Moving Around Current Status [id number]): At least 60 percent but less than 80 percent impaired, limited or restricted Mobility: Walking and Moving Around Goal Status [phone]): At least 1 percent but less than 20 percent impaired, limited or restricted    Time: [id number] PT Time Calculation (min) (ACUTE ONLY): 26 min   Charges:   PT Evaluation $PT Eval Moderate Complexity: 1 Procedure PT Treatments $Therapeutic Activity: 8-22 mins   PT G Codes:   PT G-Codes **NOT FOR INPATIENT CLASS** Functional Assessment Tool Used: clinical judgement Functional Limitation: Mobility: Walking and moving around Mobility: Walking and Moving Around Current Status [id number]): At least 60 percent but less than 80 percent impaired, limited or restricted Mobility: Walking and Moving Around Goal Status [phone]): At least 1 percent but less than 20 percent impaired, limited or restricted    Apollonia Amini 02/19/2016, 4:57 PM Pager [phone]

## 2016-02-19 NOTE — NC FL2 (Signed)
[place] LEVEL OF CARE SCREENING TOOL     IDENTIFICATION  Patient Name: Dale Morrow Birthdate: 1940-12-15 Sex: male Admission Date (Current Location): 02/18/2016  [facility] Number:  Herbalist and Address:  [facility] [place]. [facility] [place], [place] [address], [place], [place] [address]      Provider Number: [id number]  Attending Physician Name and Address:  Jonetta Osgood, MD  Relative Name and Phone Number:  [name], wife, [phone]    Current Level of Care: Hospital Recommended Level of Care: [place] Prior Approval Number:    Date Approved/Denied:   PASRR Number: [id number]  Discharge Plan: SNF    Current Diagnoses: Patient Active Problem List   Diagnosis Date Noted  . Fever 02/19/2016  . SDH (subdural hematoma) ([place]) 02/18/2016  . Diabetes mellitus, insulin dependent (IDDM), uncontrolled ([place]) 02/13/2016  . Fall at home/unwitnessed 02/13/2016  . 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 Normocytic anemia 02/13/2016  . Laceration of left eye (traumatic with significant swelling) 02/13/2016  . Diabetes mellitus with complication ([place])   . Intracranial hemorrhage ([place])   . 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 History of Takotsubo Cardiomyopathy- ECHO 05/2015 w/EF 55%   . Type II diabetes mellitus ([place])   . Aspiration pneumonia ([place]) 04/22/2015  . Status epilepticus ([place]) due to hypoglycemia 04/19/2015  . Seizure-like activity ([place])   . Failed total knee arthroplasty ([place]) 01/25/2015  . OA (osteoarthritis) of knee 01/25/2015  . Essential tremor 03/09/2014  . Depression   . Prostate cancer, primary, with metastasis from prostate to other site [facility])   . PONV (postoperative nausea and vomiting)   . De Quervain's tenosynovitis, left 04/13/2012    Orientation RESPIRATION BLADDER Height & Weight     Self, Time, Situation, Place  Normal Continent Weight: 170 lb  (77.111 kg) Height:  5\' 7"  (170.2 cm)  BEHAVIORAL SYMPTOMS/MOOD NEUROLOGICAL BOWEL NUTRITION STATUS      Continent Diet (Please see DC summary)  AMBULATORY STATUS COMMUNICATION OF NEEDS Skin   Extensive Assist Verbally Surgical wounds (Incision around eye)                       Personal Care Assistance Level of Assistance  Bathing, Feeding, Dressing Bathing Assistance: Limited assistance Feeding assistance: Limited assistance Dressing Assistance: Limited assistance     Functional Limitations Info             SPECIAL CARE FACTORS FREQUENCY  PT (By licensed PT)     PT Frequency: Not yet assesed              Contractures      Additional Factors Info  Code Status, Allergies, Insulin Sliding Scale Code Status Info: Full Allergies Info: Bee Venom, Shellfish Allergy, Augmentin   Insulin Sliding Scale Info: insulin aspart (novoLOG) injection 0-15 Units;insulin aspart (novoLOG) injection 4 Units;insulin glargine (LANTUS) injection 25 Units;       Current Medications (02/19/2016):  This is the current hospital active medication list Current Facility-Administered Medications  Medication Dose Route Frequency Provider Last Rate Last Dose  . acetaminophen (TYLENOL) tablet 650 mg  650 mg Oral Q6H PRN Reubin Milan, MD   650 mg at 02/19/16 0827   Or  . acetaminophen (TYLENOL) suppository 650 mg  650 mg Rectal Q6H PRN Reubin Milan, MD      . dorzolamide-timolol (COSOPT) 22.3-6.8 MG/ML ophthalmic solution 1 drop  1 drop Left Eye BID Reubin Milan, MD   1 drop at 02/19/16 1049  . escitalopram (LEXAPRO) tablet 20 mg  20 mg Oral Daily Reubin Milan, MD   20 mg at 02/19/16 1047  . insulin aspart (novoLOG) injection 0-15 Units  0-15 Units Subcutaneous TID WC Jonetta Osgood, MD   15 Units at 02/19/16 1200  . insulin aspart (novoLOG) injection 4 Units  4 Units Subcutaneous TID WC Jonetta Osgood, MD   4 Units at 02/19/16 1200  . [START ON 02/20/2016] insulin  glargine (LANTUS) injection 25 Units  25 Units Subcutaneous Daily Shanker Kristeen Mans, MD      . lisinopril (PRINIVIL,ZESTRIL) tablet 2.5 mg  2.5 mg Oral BID Reubin Milan, MD   2.5 mg at 02/19/16 1048  . ondansetron (ZOFRAN) tablet 4 mg  4 mg Oral Q6H PRN Reubin Milan, MD   4 mg at 02/19/16 1052   Or  . ondansetron [facility]) injection 4 mg  4 mg Intravenous Q6H PRN Reubin Milan, MD      . pantoprazole (PROTONIX) injection 40 mg  40 mg Intravenous QHS Reubin Milan, MD   40 mg at 02/19/16 0100  . polyethylene glycol (MIRALAX / GLYCOLAX) packet 17 g  17 g Oral QHS Reubin Milan, MD      . pregabalin (LYRICA) capsule 100 mg  100 mg Oral BID Reubin Milan, MD   100 mg at 02/19/16 0843  . primidone (MYSOLINE) tablet 100 mg  100 mg Oral TID Reubin Milan, MD   100 mg at 02/19/16 1138  . propranolol ER (INDERAL LA) 24 hr capsule 60 mg  60 mg Oral Daily Reubin Milan, MD   60 mg at 02/19/16 1138  . sodium chloride flush (NS) 0.9 % injection 3 mL  3 mL Intravenous Q12H Reubin Milan, MD   3 mL at 02/19/16 1154  . sodium phosphate (FLEET) 7-19 GM/118ML enema 1 enema  1 enema Rectal Once PRN Reubin Milan, MD         Discharge Medications: Please see discharge summary for a list of discharge medications.  Relevant Imaging Results:  Relevant Lab Results:   Additional Information SSN: [place], [place]

## 2016-02-20 DIAGNOSIS — E1065 Type 1 diabetes mellitus with hyperglycemia: Secondary | ICD-10-CM | POA: Diagnosis not present

## 2016-02-20 DIAGNOSIS — S0990XD Unspecified injury of head, subsequent encounter: Secondary | ICD-10-CM | POA: Diagnosis not present

## 2016-02-20 DIAGNOSIS — M7981 Nontraumatic hematoma of soft tissue: Secondary | ICD-10-CM | POA: Diagnosis not present

## 2016-02-20 DIAGNOSIS — S065X0D Traumatic subdural hemorrhage without loss of consciousness, subsequent encounter: Secondary | ICD-10-CM | POA: Diagnosis not present

## 2016-02-20 DIAGNOSIS — I5022 Chronic systolic (congestive) heart failure: Secondary | ICD-10-CM | POA: Diagnosis not present

## 2016-02-20 DIAGNOSIS — S065X0A Traumatic subdural hemorrhage without loss of consciousness, initial encounter: Secondary | ICD-10-CM | POA: Diagnosis not present

## 2016-02-20 DIAGNOSIS — Z87891 Personal history of nicotine dependence: Secondary | ICD-10-CM | POA: Diagnosis not present

## 2016-02-20 DIAGNOSIS — R209 Unspecified disturbances of skin sensation: Secondary | ICD-10-CM | POA: Diagnosis not present

## 2016-02-20 DIAGNOSIS — S069X0S Unspecified intracranial injury without loss of consciousness, sequela: Secondary | ICD-10-CM | POA: Diagnosis not present

## 2016-02-20 DIAGNOSIS — Z5189 Encounter for other specified aftercare: Secondary | ICD-10-CM | POA: Diagnosis not present

## 2016-02-20 DIAGNOSIS — R569 Unspecified convulsions: Secondary | ICD-10-CM | POA: Diagnosis not present

## 2016-02-20 DIAGNOSIS — S0592XS Unspecified injury of left eye and orbit, sequela: Secondary | ICD-10-CM | POA: Diagnosis not present

## 2016-02-20 DIAGNOSIS — K5901 Slow transit constipation: Secondary | ICD-10-CM | POA: Diagnosis not present

## 2016-02-20 DIAGNOSIS — G629 Polyneuropathy, unspecified: Secondary | ICD-10-CM | POA: Diagnosis not present

## 2016-02-20 DIAGNOSIS — I629 Nontraumatic intracranial hemorrhage, unspecified: Secondary | ICD-10-CM | POA: Diagnosis not present

## 2016-02-20 DIAGNOSIS — G40901 Epilepsy, unspecified, not intractable, with status epilepticus: Secondary | ICD-10-CM | POA: Diagnosis not present

## 2016-02-20 DIAGNOSIS — M25551 Pain in right hip: Secondary | ICD-10-CM | POA: Diagnosis not present

## 2016-02-20 DIAGNOSIS — Z79899 Other long term (current) drug therapy: Secondary | ICD-10-CM | POA: Diagnosis not present

## 2016-02-20 DIAGNOSIS — I951 Orthostatic hypotension: Secondary | ICD-10-CM | POA: Diagnosis not present

## 2016-02-20 DIAGNOSIS — I10 Essential (primary) hypertension: Secondary | ICD-10-CM | POA: Diagnosis not present

## 2016-02-20 DIAGNOSIS — G9340 Encephalopathy, unspecified: Secondary | ICD-10-CM | POA: Diagnosis not present

## 2016-02-20 DIAGNOSIS — R296 Repeated falls: Secondary | ICD-10-CM | POA: Diagnosis not present

## 2016-02-20 DIAGNOSIS — Z9181 History of falling: Secondary | ICD-10-CM | POA: Diagnosis not present

## 2016-02-20 DIAGNOSIS — D181 Lymphangioma, any site: Secondary | ICD-10-CM | POA: Diagnosis not present

## 2016-02-20 DIAGNOSIS — I6200 Nontraumatic subdural hemorrhage, unspecified: Secondary | ICD-10-CM | POA: Diagnosis not present

## 2016-02-20 DIAGNOSIS — Z8546 Personal history of malignant neoplasm of prostate: Secondary | ICD-10-CM | POA: Diagnosis not present

## 2016-02-20 DIAGNOSIS — M6281 Muscle weakness (generalized): Secondary | ICD-10-CM | POA: Diagnosis not present

## 2016-02-20 DIAGNOSIS — Z8719 Personal history of other diseases of the digestive system: Secondary | ICD-10-CM | POA: Diagnosis not present

## 2016-02-20 DIAGNOSIS — R2681 Unsteadiness on feet: Secondary | ICD-10-CM | POA: Diagnosis not present

## 2016-02-20 DIAGNOSIS — D649 Anemia, unspecified: Secondary | ICD-10-CM | POA: Diagnosis not present

## 2016-02-20 DIAGNOSIS — G8929 Other chronic pain: Secondary | ICD-10-CM | POA: Diagnosis not present

## 2016-02-20 DIAGNOSIS — M546 Pain in thoracic spine: Secondary | ICD-10-CM | POA: Diagnosis not present

## 2016-02-20 DIAGNOSIS — R262 Difficulty in walking, not elsewhere classified: Secondary | ICD-10-CM | POA: Diagnosis not present

## 2016-02-20 DIAGNOSIS — R410 Disorientation, unspecified: Secondary | ICD-10-CM | POA: Diagnosis not present

## 2016-02-20 DIAGNOSIS — I429 Cardiomyopathy, unspecified: Secondary | ICD-10-CM | POA: Diagnosis not present

## 2016-02-20 DIAGNOSIS — R509 Fever, unspecified: Secondary | ICD-10-CM | POA: Diagnosis not present

## 2016-02-20 DIAGNOSIS — R413 Other amnesia: Secondary | ICD-10-CM | POA: Diagnosis not present

## 2016-02-20 DIAGNOSIS — E118 Type 2 diabetes mellitus with unspecified complications: Secondary | ICD-10-CM | POA: Diagnosis not present

## 2016-02-20 DIAGNOSIS — W19XXXD Unspecified fall, subsequent encounter: Secondary | ICD-10-CM | POA: Diagnosis not present

## 2016-02-20 DIAGNOSIS — R600 Localized edema: Secondary | ICD-10-CM | POA: Diagnosis not present

## 2016-02-20 DIAGNOSIS — M533 Sacrococcygeal disorders, not elsewhere classified: Secondary | ICD-10-CM | POA: Diagnosis not present

## 2016-02-20 DIAGNOSIS — S0532XA Ocular laceration without prolapse or loss of intraocular tissue, left eye, initial encounter: Secondary | ICD-10-CM | POA: Diagnosis not present

## 2016-02-20 DIAGNOSIS — G250 Essential tremor: Secondary | ICD-10-CM | POA: Diagnosis not present

## 2016-02-20 DIAGNOSIS — F329 Major depressive disorder, single episode, unspecified: Secondary | ICD-10-CM | POA: Diagnosis not present

## 2016-02-20 DIAGNOSIS — M50323 Other cervical disc degeneration at C6-C7 level: Secondary | ICD-10-CM | POA: Diagnosis not present

## 2016-02-20 DIAGNOSIS — M79652 Pain in left thigh: Secondary | ICD-10-CM | POA: Diagnosis not present

## 2016-02-20 DIAGNOSIS — R41841 Cognitive communication deficit: Secondary | ICD-10-CM | POA: Diagnosis not present

## 2016-02-20 DIAGNOSIS — W19XXXA Unspecified fall, initial encounter: Secondary | ICD-10-CM | POA: Diagnosis not present

## 2016-02-20 DIAGNOSIS — R531 Weakness: Secondary | ICD-10-CM | POA: Diagnosis not present

## 2016-02-20 DIAGNOSIS — M50322 Other cervical disc degeneration at C5-C6 level: Secondary | ICD-10-CM | POA: Diagnosis not present

## 2016-02-20 DIAGNOSIS — E46 Unspecified protein-calorie malnutrition: Secondary | ICD-10-CM | POA: Diagnosis not present

## 2016-02-20 DIAGNOSIS — R5381 Other malaise: Secondary | ICD-10-CM | POA: Diagnosis not present

## 2016-02-20 DIAGNOSIS — R5383 Other fatigue: Secondary | ICD-10-CM | POA: Diagnosis not present

## 2016-02-20 LAB — COMPREHENSIVE METABOLIC PANEL
ALK PHOS: 65 U/L (ref 38–126)
ALT: 19 U/L (ref 17–63)
AST: 29 U/L (ref 15–41)
Albumin: 2.9 g/dL — ABNORMAL LOW (ref 3.5–5.0)
Anion gap: 13 (ref 5–15)
BILIRUBIN TOTAL: 0.7 mg/dL (ref 0.3–1.2)
BUN: 13 mg/dL (ref 6–20)
CALCIUM: 8.1 mg/dL — AB (ref 8.9–10.3)
CO2: 25 mmol/L (ref 22–32)
CREATININE: 0.8 mg/dL (ref 0.61–1.24)
Chloride: 96 mmol/L — ABNORMAL LOW (ref 101–111)
Glucose, Bld: 161 mg/dL — ABNORMAL HIGH (ref 65–99)
Potassium: 3.7 mmol/L (ref 3.5–5.1)
Sodium: 134 mmol/L — ABNORMAL LOW (ref 135–145)
TOTAL PROTEIN: 5.9 g/dL — AB (ref 6.5–8.1)

## 2016-02-20 LAB — CBC WITH DIFFERENTIAL/PLATELET
Basophils Absolute: 0 10*3/uL (ref 0.0–0.1)
Basophils Relative: 0 %
EOS PCT: 1 %
Eosinophils Absolute: 0.1 10*3/uL (ref 0.0–0.7)
HCT: 30.6 % — ABNORMAL LOW (ref 39.0–52.0)
HEMOGLOBIN: 10.5 g/dL — AB (ref 13.0–17.0)
LYMPHS ABS: 1.1 10*3/uL (ref 0.7–4.0)
LYMPHS PCT: 16 %
MCH: 31.5 pg (ref 26.0–34.0)
MCHC: 34.3 g/dL (ref 30.0–36.0)
MCV: 91.9 fL (ref 78.0–100.0)
Monocytes Absolute: 0.7 10*3/uL (ref 0.1–1.0)
Monocytes Relative: 10 %
Neutro Abs: 5.2 10*3/uL (ref 1.7–7.7)
Neutrophils Relative %: 73 %
PLATELETS: 246 10*3/uL (ref 150–400)
RBC: 3.33 MIL/uL — AB (ref 4.22–5.81)
RDW: 13.9 % (ref 11.5–15.5)
WBC: 7.1 10*3/uL (ref 4.0–10.5)

## 2016-02-20 LAB — GLUCOSE, CAPILLARY
GLUCOSE-CAPILLARY: 107 mg/dL — AB (ref 65–99)
GLUCOSE-CAPILLARY: 143 mg/dL — AB (ref 65–99)
Glucose-Capillary: 144 mg/dL — ABNORMAL HIGH (ref 65–99)
Glucose-Capillary: 303 mg/dL — ABNORMAL HIGH (ref 65–99)

## 2016-02-20 LAB — URINE CULTURE: CULTURE: NO GROWTH

## 2016-02-20 MED ORDER — PANTOPRAZOLE SODIUM 40 MG PO TBEC: 40.0000 mg | DELAYED_RELEASE_TABLET | Freq: Every day | ORAL | Status: DC

## 2016-02-20 MED ORDER — INSULIN GLARGINE 100 UNIT/ML SOLOSTAR PEN: 21.0000 [IU] | PEN_INJECTOR | Freq: Every day | SUBCUTANEOUS | Status: DC

## 2016-02-20 MED ORDER — INSULIN ASPART 100 UNIT/ML ~~LOC~~ SOLN: 4.0000 [IU] | Freq: Three times a day (TID) | SUBCUTANEOUS | Status: AC

## 2016-02-20 MED ORDER — INSULIN ASPART 100 UNIT/ML ~~LOC~~ SOLN: SUBCUTANEOUS | Status: AC

## 2016-02-20 MED ORDER — POLYETHYLENE GLYCOL 3350 17 G PO PACK: 17.0000 g | PACK | Freq: Every day | ORAL | Status: AC

## 2016-02-20 NOTE — Progress Notes (Signed)
OT Cancellation Note  Patient Details Name: Dale Morrow MRN: [id number] DOB: 04-06-1941   Cancelled Treatment:    Reason Eval/Treat Not Completed:  Plan is for pt to go to SNF today. Will defer all further OT needs to next venue of care.  Benito Mccreedy OTR/L [id number] 02/20/2016, 3:43 PM

## 2016-02-20 NOTE — Clinical Social Work Placement (Signed)
   CLINICAL SOCIAL WORK PLACEMENT  NOTE  Date:  02/20/2016  Patient Details  Name: Dale Morrow MRN: [id number] Date of Birth: September 17, 1941  Clinical Social Work is seeking post-discharge placement for this patient at the [place] level of care (*CSW will initial, date and re-position this form in  chart as items are completed):  Yes   Patient/family provided with [place] Work Department's list of facilities offering this level of care within the geographic area requested by the patient (or if unable, by the patient's family).  Yes   Patient/family informed of their freedom to choose among providers that offer the needed level of care, that participate in Medicare, Medicaid or managed care program needed by the patient, have an available bed and are willing to accept the patient.  Yes   Patient/family informed of [facility]'s ownership interest in [facility] and [facility], as well as of the fact that they are under no obligation to receive care at these facilities.  PASRR submitted to EDS on       PASRR number received on       Existing PASRR number confirmed on 02/19/16     FL2 transmitted to all facilities in geographic area requested by pt/family on 02/19/16     FL2 transmitted to all facilities within larger geographic area on       Patient informed that his/her managed care company has contracts with or will negotiate with certain facilities, including the following:        Yes   Patient/family informed of bed offers received.  Patient chooses bed at [facility]     Physician recommends and patient chooses bed at      Patient to be transferred to [facility] on 02/20/16.  Patient to be transferred to facility by [facility]     Patient family notified on 02/20/16 of transfer.  Name of family member notified:  Spouse     PHYSICIAN Please prepare prescriptions     Additional Comment:     _______________________________________________ Benard Halsted, [place] 02/20/2016, 3:25 PM

## 2016-02-20 NOTE — Progress Notes (Signed)
Awake/alert this am Spouse at bedside Stable to transfer to SNF

## 2016-02-20 NOTE — Progress Notes (Signed)
Patient will DC to: [facility] Anticipated DC date: 02/20/16 Family notified: Spouse Transport by: Corey Harold   Per MD patient ready for DC to [facility]. RN, patient, patient's family, and facility notified of DC. RN given number for report. DC packet on chart. Ambulance transport requested for patient.   CSW signing off.  Cedric Fishman, [place] Social Worker [phone]

## 2016-02-20 NOTE — Progress Notes (Signed)
Discharge orders received. Pt for discharge to SNF. IV and telemetry D/C'd. D/C instructions given in report to SNF. Vital signs within normal limits. Pt to be transported via [facility]. Fortino Sic, RN, BSN 02/20/2016 7:19 PM

## 2016-02-20 NOTE — Discharge Summary (Signed)
PATIENT DETAILS Name: Dale Morrow Age: 75 y.o. Sex: male Date of Birth: October 04, 1941 MRN: [id number]. Admitting Physician: Reubin Milan, MD [id number], Valla Leaver, MD  Admit Date: 02/18/2016 Discharge date: 02/20/2016  Recommendations for Outpatient Follow-up:  1. Please ensure follow-up with neurosurgery, ophthalmology 2. Please repeat CBC/BMET at next visit 3. Please follow blood cultures till final  PRIMARY DISCHARGE DIAGNOSIS:  Principal Problem:   SDH (subdural hematoma) (HCC) Active Problems:   Depression   Essential tremor   History of Takotsubo Cardiomyopathy- ECHO 05/2015 w/EF 55%   Type II diabetes mellitus (HCC)   Normocytic anemia   Fever      PAST MEDICAL HISTORY: Past Medical History  Diagnosis Date  . Arthritis     left knee  . Depression   . De Quervain's tenosynovitis, left 04/2012  . Tremors of nervous system     takes propranolol for tremors  . Prostate cancer, primary, with metastasis from prostate to other site [facility])     a. metastasis to lymph nodes;  b. s/p XRT.  [name] Complication of anesthesia     pt denies ponv, pt woke up during colonscopy in past  . Diabetes mellitus     Insulin pump, takes lisinopril for kidneys, dr Buddy Duty endocrinologist  . Left-sided Bell's palsy   . Right-sided Bell's palsy   . Nonischemic cardiomyopathy ([place])     a. 04/2015 Echo: EF 25-30%, mid apical, antseptal, anterolat, inflat, infsept, apical AK. ? takotsubo. No thrombus. PASP 47mmHg;  b. 04/2014 Lexi MV: EF 29%, fixed mod size and intensity mid-dist ant, apical, inferior scar w/ wma's;  c. 04/2015 Cath: LM nl, LAD 50p/m, LCX 25p, RI nl, RCA nl, RPAV 50d-->Med Rx.  . Chronic systolic CHF (congestive heart failure) ([place])     a. 04/2015 Echo: EF 25-30%.  [name] GERD (gastroesophageal reflux disease)     DISCHARGE MEDICATIONS: Current Discharge Medication List    START taking these medications   Details  !! insulin aspart (NOVOLOG) 100 UNIT/ML injection 0-15 Units,  Subcutaneous, 3 times daily with meals,  CBG < 70: implement hypoglycemia protocol CBG 70 - 120: 0 units CBG 121 - 150: 2 units CBG 151 - 200: 3 units CBG 201 - 250: 5 units CBG 251 - 300: 8 units CBG 301 - 350: 11 units CBG 351 - 400: 15 units CBG > 400: call MD Refills: 11    !! insulin aspart (NOVOLOG) 100 UNIT/ML injection Inject 4 Units into the skin 3 (three) times daily with meals.    polyethylene glycol (MIRALAX / GLYCOLAX) packet Take 17 g by mouth at bedtime.     !! - Potential duplicate medications found. Please discuss with provider.    CONTINUE these medications which have CHANGED   Details  Insulin Glargine (LANTUS) 100 UNIT/ML Solostar Pen Inject 21 Units into the skin daily.      CONTINUE these medications which have NOT CHANGED   Details  CALCIUM-VITAMIN D PO Take 1 tablet by mouth daily at 12 noon.    dorzolamide-timolol (COSOPT) 22.3-6.8 MG/ML ophthalmic solution Place 1 drop into the left eye 2 (two) times daily. Qty: 10 mL, Refills: 0    escitalopram (LEXAPRO) 20 MG tablet Take 20 mg by mouth daily.    lisinopril (PRINIVIL,ZESTRIL) 2.5 MG tablet TAKE 1 TABLET BY MOUTH TWICE DAILY Qty: 60 tablet, Refills: 6    Multiple Vitamins-Minerals (ONE-A-DAY MENS 50+ ADVANTAGE PO) Take 1 tablet by mouth daily at 12 noon.    polyethylene  glycol powder (GLYCOLAX/MIRALAX) powder Take 17 g by mouth at bedtime.     pregabalin (LYRICA) 100 MG capsule Take 1 capsule (100 mg total) by mouth 3 (three) times daily. Qty: 90 capsule, Refills: 5    primidone (MYSOLINE) 50 MG tablet TAKE 2 TABLETS BY MOUTH 3 TIMES DAILY. Qty: 180 tablet, Refills: 11    propranolol ER (INDERAL LA) 60 MG 24 hr capsule Take 1 capsule (60 mg total) by mouth daily. Qty: 30 capsule, Refills: 11      STOP taking these medications     insulin lispro (HUMALOG KWIKPEN) 100 UNIT/ML KiwkPen      ondansetron (ZOFRAN) 4 MG tablet      rosuvastatin (CRESTOR) 10 MG tablet      zolendronic acid  (ZOMETA) 4 MG/5ML injection         ALLERGIES:   Allergies  Allergen Reactions  . Bee Venom Anaphylaxis  . Shellfish Allergy Anaphylaxis  . Augmentin [Amoxicillin-Pot Clavulanate] Rash    BRIEF HPI:  See H&P, Labs, Consult and Test reports for all details in brief, Patient is a 75 y.o. male with a hx of L de Quervain's tenosynovitis, resting tremor, prostate cancer, diabetes, Bell's palsy, nonischemic cardiomyopathy who was just discharged from the trauma services after he sustained a syncopal episode and traumatic brain injury and left eye injury. He was brought back to the hospital for fever, worsening weakness.  CONSULTATIONS:   None  PERTINENT RADIOLOGIC STUDIES: Dg Chest 1 View  02/13/2016  CLINICAL DATA:  Increasing leg pain after a fall yesterday. EXAM: CHEST 1 VIEW COMPARISON:  04/21/2015 FINDINGS: Shallow inspiration. Normal heart size and pulmonary vascularity. No focal airspace disease or consolidation in the lungs. No blunting of costophrenic angles. No pneumothorax. Mediastinal contours appear intact. IMPRESSION: No active disease. Electronically Signed   By: Lucienne Capers M.D.   On: 02/13/2016 04:44   Ct Head Wo Contrast  02/18/2016  CLINICAL DATA:  Altered mental status. Recent fall and subdural hematoma. Subsequent encounter EXAM: CT HEAD WITHOUT CONTRAST TECHNIQUE: Contiguous axial images were obtained from the base of the skull through the vertex without intravenous contrast. COMPARISON:  02/14/2016 FINDINGS: Skull and Sinuses:Swelling around the left face and frontal scalp is diminished. Negative for fracture or hemo sinus. Visualized orbits: Bilateral cataract resection.  No acute finding. Brain: Very similar volume and extent of intracranial hemorrhage as seen previously. There is scattered traumatic pattern subarachnoid blood greatest in the anterior hemispheric sulci and low left sylvian fissure. Small volume intraventricular hemorrhage layering in the occipital horns  of the lateral ventricles. No hydrocephalus or change in ventricular volume. There are bilateral predominantly low-density subdural collections with mild cortical mass effect. The subdural clot in the right frontal region is mildly increased from prior, interval hemorrhage versus redistribution, measuring 4 mm in thickness today. No parenchymal hemorrhage or swelling. No acute infarct. IMPRESSION: Compared to 02/14/2016, re- distributed or mildly increased right frontal subdural hematoma without increased mass effect. Elsewhere stable intracranial hemorrhage that is subdural, subarachnoid, and intraventricular. Electronically Signed   By: Monte Fantasia M.D.   On: 02/18/2016 20:38   Ct Head Wo Contrast  02/14/2016  CLINICAL DATA:  Follow-up traumatic brain injury. EXAM: CT HEAD WITHOUT CONTRAST TECHNIQUE: Contiguous axial images were obtained from the base of the skull through the vertex without intravenous contrast. COMPARISON:  02/13/2016 FINDINGS: Multifocal acute intracranial hemorrhage again demonstrated. Acute subdural hemorrhage demonstrated focally along the anterior falx as well as in the left sylvian fissure region. Multifocal  areas of intraparenchymal hemorrhage demonstrated predominate along the subcortical or cortical surfaces in the frontal lobes bilaterally along the convexity, and in the left parietal lobe. Mild intraventricular hemorrhage in the posterior horns of the lateral ventricles bilaterally. No significant progression in these areas since the previous study. Subdural Hemorrhage along the right anterior frontal region and subarachnoid hemorrhage along the parafalcine sulci is less distinct than on the previous study. mild cerebral atrophy. Mild ventricular dilatation is likely due to central atrophy. No mass effect or midline shift. Gray-white matter junctions are distinct. Large subcutaneous scalp hematoma in the left anterior frontal region extending to the temporal region and left side  of the face is again demonstrated, slightly smaller than previous study. IMPRESSION: Multifocal acute intracranial hemorrhage, including subdural, subarachnoid, intraparenchymal, intraventricular hemorrhage. No progression since previous study. Some areas are becoming less distinct. Electronically Signed   By: Lucienne Capers M.D.   On: 02/14/2016 05:49   Ct Head Wo Contrast  02/13/2016  CLINICAL DATA:  Initial evaluation for acute trauma, fall. EXAM: CT HEAD WITHOUT CONTRAST CT MAXILLOFACIAL WITHOUT CONTRAST CT CERVICAL SPINE WITHOUT CONTRAST TECHNIQUE: Multidetector CT imaging of the head, cervical spine, and maxillofacial structures were performed using the standard protocol without intravenous contrast. Multiplanar CT image reconstructions of the cervical spine and maxillofacial structures were also generated. COMPARISON:  Prior study from 04/19/2015. FINDINGS: CT HEAD FINDINGS Large left periorbital and frontal scalp contusion. Globes intact. No retro-orbital hematoma or other pathology. No calvarial fracture.  Mastoid air cells are clear. Age-related cerebral atrophy with chronic microvascular ischemic disease present. No acute large vessel territory infarct. Acute subdural/ extra-axial hemorrhage overlies the right frontal convexity measuring up to 11 mm. Scattered subdural blood along the falx with a left parafalcine subdural hematoma measuring up to 4 mm. There is associated scattered acute subarachnoid hemorrhage within the right frontal lobe and parasagittal left frontal parietal lobes. Subarachnoid blood along the superior aspect of the left sylvian fissure. 13 mm contusion at the left anterior temporal pole. Small amount of hemorrhage along the septum pellucidum measures up to 9 mm. Acute subarachnoid hemorrhage within the posterior right temporal lobe. No midline shift. No hydrocephalus. Small amount of layering intraventricular hemorrhage within the occipital horns bilaterally. Basilar cisterns  remain patent. Superimposed chronic appearing bilateral subdural hygroma is measure up to 5-6 mm bilaterally. CT MAXILLOFACIAL FINDINGS Large left forehead/left periorbital and facial contusion. Mild left-sided enophthalmos. The left globe itself appears intact without retro-orbital hematoma or other pathology. Right globe intact without retro-orbital pathology. Bony orbits intact. No orbital floor fracture. Lamina papyracea intact. Orbital roofs intact. Zygomatic arches intact. No acute maxillary fracture. Pterygoid plates intact. No acute nasal bone fracture. Mandible intact. Mandibular condyles normally situated within the temporomandibular fossa. Paranasal and this is are largely clear. CT CERVICAL SPINE FINDINGS Trace anterolisthesis of C4 on C5, likely chronic. The vertebral bodies are otherwise normally aligned with preservation of the normal cervical lordosis. Vertebral body heights are preserved. Normal C1-2 articulations are intact. No prevertebral soft tissue swelling. No acute fracture or listhesis. Moderate degenerative spondylolysis at C5-6 and C6-7. Scatter multilevel facet arthrosis noted. Visualized soft tissues of the neck demonstrate no acute abnormality. Scattered vascular calcifications about the carotid bifurcations. Visualized lung apices are clear without evidence of apical pneumothorax. IMPRESSION: CT HEAD: 1. Acute traumatic subarachnoid hemorrhage involving both cerebral hemispheres as above, with additional 13 mm parenchymal contusion at the left temporal pole. 2. Small volume acute subdural hemorrhage overlying the right frontal convexity and falx.  3. Small volume acute intraventricular hemorrhage within the occipital horns of both lateral ventricles. This may be related to redistribution. No hydrocephalus or midline shift. 4. Large left frontal/periorbital scalp contusion. No calvarial fracture. CT MAXILLOFACIAL: 1. No acute maxillofacial fracture. 2. Large left forehead/periorbital and  facial contusion. There is associated left enophthalmos. Left globe itself appears grossly intact without retro-orbital hematoma or other pathology. CT CERVICAL SPINE: No acute traumatic injury within the cervical spine. Critical Value/emergent results were called by telephone at the time of interpretation on 02/13/2016 at 5:21 am to Dr. Veryl Speak , who verbally acknowledged these results. Electronically Signed   By: Jeannine Boga M.D.   On: 02/13/2016 05:23   Ct Cervical Spine Wo Contrast  02/13/2016  CLINICAL DATA:  Initial evaluation for acute trauma, fall. EXAM: CT HEAD WITHOUT CONTRAST CT MAXILLOFACIAL WITHOUT CONTRAST CT CERVICAL SPINE WITHOUT CONTRAST TECHNIQUE: Multidetector CT imaging of the head, cervical spine, and maxillofacial structures were performed using the standard protocol without intravenous contrast. Multiplanar CT image reconstructions of the cervical spine and maxillofacial structures were also generated. COMPARISON:  Prior study from 04/19/2015. FINDINGS: CT HEAD FINDINGS Large left periorbital and frontal scalp contusion. Globes intact. No retro-orbital hematoma or other pathology. No calvarial fracture.  Mastoid air cells are clear. Age-related cerebral atrophy with chronic microvascular ischemic disease present. No acute large vessel territory infarct. Acute subdural/ extra-axial hemorrhage overlies the right frontal convexity measuring up to 11 mm. Scattered subdural blood along the falx with a left parafalcine subdural hematoma measuring up to 4 mm. There is associated scattered acute subarachnoid hemorrhage within the right frontal lobe and parasagittal left frontal parietal lobes. Subarachnoid blood along the superior aspect of the left sylvian fissure. 13 mm contusion at the left anterior temporal pole. Small amount of hemorrhage along the septum pellucidum measures up to 9 mm. Acute subarachnoid hemorrhage within the posterior right temporal lobe. No midline shift. No  hydrocephalus. Small amount of layering intraventricular hemorrhage within the occipital horns bilaterally. Basilar cisterns remain patent. Superimposed chronic appearing bilateral subdural hygroma is measure up to 5-6 mm bilaterally. CT MAXILLOFACIAL FINDINGS Large left forehead/left periorbital and facial contusion. Mild left-sided enophthalmos. The left globe itself appears intact without retro-orbital hematoma or other pathology. Right globe intact without retro-orbital pathology. Bony orbits intact. No orbital floor fracture. Lamina papyracea intact. Orbital roofs intact. Zygomatic arches intact. No acute maxillary fracture. Pterygoid plates intact. No acute nasal bone fracture. Mandible intact. Mandibular condyles normally situated within the temporomandibular fossa. Paranasal and this is are largely clear. CT CERVICAL SPINE FINDINGS Trace anterolisthesis of C4 on C5, likely chronic. The vertebral bodies are otherwise normally aligned with preservation of the normal cervical lordosis. Vertebral body heights are preserved. Normal C1-2 articulations are intact. No prevertebral soft tissue swelling. No acute fracture or listhesis. Moderate degenerative spondylolysis at C5-6 and C6-7. Scatter multilevel facet arthrosis noted. Visualized soft tissues of the neck demonstrate no acute abnormality. Scattered vascular calcifications about the carotid bifurcations. Visualized lung apices are clear without evidence of apical pneumothorax. IMPRESSION: CT HEAD: 1. Acute traumatic subarachnoid hemorrhage involving both cerebral hemispheres as above, with additional 13 mm parenchymal contusion at the left temporal pole. 2. Small volume acute subdural hemorrhage overlying the right frontal convexity and falx. 3. Small volume acute intraventricular hemorrhage within the occipital horns of both lateral ventricles. This may be related to redistribution. No hydrocephalus or midline shift. 4. Large left frontal/periorbital scalp  contusion. No calvarial fracture. CT MAXILLOFACIAL: 1. No acute maxillofacial  fracture. 2. Large left forehead/periorbital and facial contusion. There is associated left enophthalmos. Left globe itself appears grossly intact without retro-orbital hematoma or other pathology. CT CERVICAL SPINE: No acute traumatic injury within the cervical spine. Critical Value/emergent results were called by telephone at the time of interpretation on 02/13/2016 at 5:21 am to Dr. Veryl Speak , who verbally acknowledged these results. Electronically Signed   By: Jeannine Boga M.D.   On: 02/13/2016 05:23   Dg Chest Port 1 View  02/18/2016  CLINICAL DATA:  Fever ear. EXAM: PORTABLE CHEST 1 VIEW COMPARISON:  02/13/2016 FINDINGS: The heart size and mediastinal contours are within normal limits. Both lungs are clear. The visualized skeletal structures are unremarkable. IMPRESSION: Normal chest x-ray. Electronically Signed   By: Marijo Sanes M.D.   On: 02/18/2016 16:36   Dg Hip Unilat With Pelvis 2-3 Views Left  02/13/2016  CLINICAL DATA:  Increasing pain after a fall. EXAM: DG HIP (WITH OR WITHOUT PELVIS) 2-3V LEFT COMPARISON:  None. FINDINGS: Pelvis and left hip appear intact. No evidence of acute fracture or dislocation. No focal bone lesion or bone destruction. Vascular calcifications. Surgical clips over the symphysis pubis. IMPRESSION: No acute bony abnormalities. Electronically Signed   By: Lucienne Capers M.D.   On: 02/13/2016 04:45   Dg Femur Min 2 Views Left  02/13/2016  CLINICAL DATA:  Increasing pain and difficulty weight-bearing after a fall late afternoon of 02/12/2016. EXAM: LEFT FEMUR 2 VIEWS COMPARISON:  None. FINDINGS: Left total knee arthroplasty. No evidence of acute fracture or dislocation. No focal bone lesion or bone destruction. Soft tissues are unremarkable. IMPRESSION: No acute bony abnormalities. Electronically Signed   By: Lucienne Capers M.D.   On: 02/13/2016 04:44   Ct Maxillofacial Wo  Cm  02/13/2016  CLINICAL DATA:  Initial evaluation for acute trauma, fall. EXAM: CT HEAD WITHOUT CONTRAST CT MAXILLOFACIAL WITHOUT CONTRAST CT CERVICAL SPINE WITHOUT CONTRAST TECHNIQUE: Multidetector CT imaging of the head, cervical spine, and maxillofacial structures were performed using the standard protocol without intravenous contrast. Multiplanar CT image reconstructions of the cervical spine and maxillofacial structures were also generated. COMPARISON:  Prior study from 04/19/2015. FINDINGS: CT HEAD FINDINGS Large left periorbital and frontal scalp contusion. Globes intact. No retro-orbital hematoma or other pathology. No calvarial fracture.  Mastoid air cells are clear. Age-related cerebral atrophy with chronic microvascular ischemic disease present. No acute large vessel territory infarct. Acute subdural/ extra-axial hemorrhage overlies the right frontal convexity measuring up to 11 mm. Scattered subdural blood along the falx with a left parafalcine subdural hematoma measuring up to 4 mm. There is associated scattered acute subarachnoid hemorrhage within the right frontal lobe and parasagittal left frontal parietal lobes. Subarachnoid blood along the superior aspect of the left sylvian fissure. 13 mm contusion at the left anterior temporal pole. Small amount of hemorrhage along the septum pellucidum measures up to 9 mm. Acute subarachnoid hemorrhage within the posterior right temporal lobe. No midline shift. No hydrocephalus. Small amount of layering intraventricular hemorrhage within the occipital horns bilaterally. Basilar cisterns remain patent. Superimposed chronic appearing bilateral subdural hygroma is measure up to 5-6 mm bilaterally. CT MAXILLOFACIAL FINDINGS Large left forehead/left periorbital and facial contusion. Mild left-sided enophthalmos. The left globe itself appears intact without retro-orbital hematoma or other pathology. Right globe intact without retro-orbital pathology. Bony orbits  intact. No orbital floor fracture. Lamina papyracea intact. Orbital roofs intact. Zygomatic arches intact. No acute maxillary fracture. Pterygoid plates intact. No acute nasal bone fracture. Mandible intact. Mandibular  condyles normally situated within the temporomandibular fossa. Paranasal and this is are largely clear. CT CERVICAL SPINE FINDINGS Trace anterolisthesis of C4 on C5, likely chronic. The vertebral bodies are otherwise normally aligned with preservation of the normal cervical lordosis. Vertebral body heights are preserved. Normal C1-2 articulations are intact. No prevertebral soft tissue swelling. No acute fracture or listhesis. Moderate degenerative spondylolysis at C5-6 and C6-7. Scatter multilevel facet arthrosis noted. Visualized soft tissues of the neck demonstrate no acute abnormality. Scattered vascular calcifications about the carotid bifurcations. Visualized lung apices are clear without evidence of apical pneumothorax. IMPRESSION: CT HEAD: 1. Acute traumatic subarachnoid hemorrhage involving both cerebral hemispheres as above, with additional 13 mm parenchymal contusion at the left temporal pole. 2. Small volume acute subdural hemorrhage overlying the right frontal convexity and falx. 3. Small volume acute intraventricular hemorrhage within the occipital horns of both lateral ventricles. This may be related to redistribution. No hydrocephalus or midline shift. 4. Large left frontal/periorbital scalp contusion. No calvarial fracture. CT MAXILLOFACIAL: 1. No acute maxillofacial fracture. 2. Large left forehead/periorbital and facial contusion. There is associated left enophthalmos. Left globe itself appears grossly intact without retro-orbital hematoma or other pathology. CT CERVICAL SPINE: No acute traumatic injury within the cervical spine. Critical Value/emergent results were called by telephone at the time of interpretation on 02/13/2016 at 5:21 am to Dr. Veryl Speak , who verbally  acknowledged these results. Electronically Signed   By: Jeannine Boga M.D.   On: 02/13/2016 05:23     PERTINENT LAB RESULTS: CBC:  Recent Labs  02/19/16 0550 02/20/16 0638  WBC 6.8 7.1  HGB 10.3* 10.5*  HCT 30.3* 30.6*  PLT 244 246   CMET CMP     Component Value Date/Time   NA 134* 02/20/2016 0638   K 3.7 02/20/2016 0638   CL 96* 02/20/2016 0638   CO2 25 02/20/2016 0638   GLUCOSE 161* 02/20/2016 0638   BUN 13 02/20/2016 0638   CREATININE 0.80 02/20/2016 0638   CALCIUM 8.1* 02/20/2016 0638   PROT 5.9* 02/20/2016 0638   ALBUMIN 2.9* 02/20/2016 0638   AST 29 02/20/2016 0638   ALT 19 02/20/2016 0638   ALKPHOS 65 02/20/2016 0638   BILITOT 0.7 02/20/2016 0638   GFRNONAA >60 02/20/2016 0638   GFRAA >60 02/20/2016 0638    GFR Estimated Creatinine Clearance: 75.7 mL/min (by C-G formula based on Cr of 0.8). No results for input(s): LIPASE, AMYLASE in the last 72 hours.  Recent Labs  02/18/16 1524  TROPONINI 0.03   Invalid input(s): POCBNP No results for input(s): DDIMER in the last 72 hours. No results for input(s): HGBA1C in the last 72 hours. No results for input(s): CHOL, HDL, LDLCALC, TRIG, CHOLHDL, LDLDIRECT in the last 72 hours. No results for input(s): TSH, T4TOTAL, T3FREE, THYROIDAB in the last 72 hours.  Invalid input(s): FREET3 No results for input(s): VITAMINB12, FOLATE, FERRITIN, TIBC, IRON, RETICCTPCT in the last 72 hours. Coags: No results for input(s): INR in the last 72 hours.  Invalid input(s): PT Microbiology: Recent Results (from the past 240 hour(s))  Urine culture     Status: Abnormal   Collection Time: 02/13/16 10:45 AM  Result Value Ref Range Status   Specimen Description URINE, RANDOM  Final   Special Requests NONE  Final   Culture MULTIPLE SPECIES PRESENT, SUGGEST RECOLLECTION (A)  Final   Report Status 02/14/2016 FINAL  Final  MRSA PCR Screening     Status: None   Collection Time: 02/13/16  5:05 PM  Result Value Ref Range  Status   MRSA by PCR NEGATIVE NEGATIVE Final    Comment:        The GeneXpert MRSA Assay (FDA approved for NASAL specimens only), is one component of a comprehensive MRSA colonization surveillance program. It is not intended to diagnose MRSA infection nor to guide or monitor treatment for MRSA infections.   Culture, Urine     Status: Abnormal   Collection Time: 02/16/16 12:18 PM  Result Value Ref Range Status   Specimen Description URINE, CLEAN CATCH  Final   Special Requests NONE  Final   Culture 6,000 COLONIES/mL INSIGNIFICANT GROWTH (A)  Final   Report Status 02/17/2016 FINAL  Final  Blood Culture (routine x 2)     Status: None (Preliminary result)   Collection Time: 02/18/16  3:24 PM  Result Value Ref Range Status   Specimen Description LEFT ANTECUBITAL  Final   Special Requests BOTTLES DRAWN AEROBIC AND ANAEROBIC 5CC  Final   Culture NO GROWTH < 24 HOURS  Final   Report Status PENDING  Incomplete  Blood Culture (routine x 2)     Status: None (Preliminary result)   Collection Time: 02/18/16  3:36 PM  Result Value Ref Range Status   Specimen Description RIGHT ANTECUBITAL  Final   Special Requests BOTTLES DRAWN AEROBIC AND ANAEROBIC 5CC  Final   Culture NO GROWTH < 24 HOURS  Final   Report Status PENDING  Incomplete  Urine culture     Status: None   Collection Time: 02/18/16  6:26 PM  Result Value Ref Range Status   Specimen Description URINE, CATHETERIZED  Final   Special Requests NONE  Final   Culture NO GROWTH 2 DAYS  Final   Report Status 02/20/2016 FINAL  Final     BRIEF HOSPITAL COURSE:  Low Grade Fever: No foci of infection apparent-UA/chest x-ray negative.Urine culture shows insignificant growth. Fever curve has improved, patient is afebrile for the past 24 hours. Does not appear toxic, no leukocytosis. Could be central fever-from blood in the brain. Blood culture continues to be negative, suspect he can be discharged to SNF today. Continue to monitor off  antibiotics at this time.  Active Problems: Mild Encephalopathy:?etiology-has low-grade inferior-but no obvious infection. Spoke with neurosurgery, confusion could be from concussion-per Dr. Jimmey Ralph could take weeks for it to resolve. In any event, he is awake and alert today, answers questions appropriately. EEG was negative for seizures.   TBI w/ multiple areas of SAH, SDH plus bilateral IVH: CT head on 5/7 appears similar to his most recent CT head. Spoke with neurosurgery-Dr. Buelah Manis reviewed images and did not feel any further workup was required while inpatient. Recommended follow-up with him in the clinic.  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 Telemetry continues to be unremarkable. Ensure follow-up with cardiology.  Depression: Continue Lexapro  History of essential tremor: Continue propanolol and pemoline  Hypertension: Cautiously continue with lisinopril.  Type 2 diabetes: Previously on insulin pump-CBGs currently controlled with Lantus 25 units and 4 units of NovoLog with meals. Further optimization deferred to the outpatient setting.   Deconditioning: Likely secondary to acute illness-family agreeable to SNF.   TODAY-DAY OF DISCHARGE:  Subjective:   Dale Morrow today has  no headache,no chest abdominal pain,no new weakness tingling or numbness, feels much better.  Objective:   Blood pressure 143/56, pulse 79, temperature 97.7 F (36.5 C), temperature source Oral, resp. rate 18, height 5\' 7"  (1.702 m), weight 77.111 kg (170 lb), SpO2 97 %.  Intake/Output Summary (Last 24 hours) at 02/20/16 1203 Last data filed at 02/20/16 0600  Gross per 24 hour  Intake    340 ml   Output    525 ml  Net   -185 ml   Filed Weights   02/19/16 0200  Weight: 77.111 kg (170 lb)    Exam Awake Alert, Oriented *3, No new F.N deficits, Normal affect [place].AT,PERRAL.Continues to have unchanged bilateral periorbital ecchymosis. Supple Neck,No JVD, No cervical lymphadenopathy appriciated.  Symmetrical Chest wall movement, Good air movement bilaterally, CTAB RRR,No Gallops,Rubs or new Murmurs, No Parasternal Heave +ve B.Sounds, Abd Soft, Non tender, No organomegaly appriciated, No rebound -guarding or rigidity. No Cyanosis, Clubbing or edema, No new Rash or bruise  DISCHARGE CONDITION: Stable  DISPOSITION: SNF  DISCHARGE INSTRUCTIONS:    Check CBGs before meals and at bedtime  Activity:  As tolerated with Full fall precautions use walker/cane & assistance as needed  Get Medicines reviewed and adjusted: Please take all your medications with you for your next visit with your Primary MD  Please request your Primary MD to go over all hospital tests and procedure/radiological results at the follow up, please ask your Primary MD to get all Hospital records sent to his/her office.  If you experience worsening of your admission symptoms, develop shortness of breath, life threatening emergency, suicidal or homicidal thoughts you must seek medical attention immediately by calling 911 or calling your MD immediately  if symptoms less severe.  You must read complete instructions/literature along with all the possible adverse reactions/side effects for all the Medicines you take and that have been prescribed to you. Take any new Medicines after you have completely understood and accpet all the possible adverse reactions/side effects.   Do not drive when taking Pain medications.   Do not take more than prescribed Pain, Sleep and Anxiety Medications  Special Instructions: If you have smoked or chewed Tobacco  in the last 2 yrs please stop smoking, stop any regular Alcohol  and or  any Recreational drug use.  Wear Seat belts while driving.  Please note  You were cared for by a hospitalist during your hospital stay. Once you are discharged, your primary care physician will handle any further medical issues. Please note that NO REFILLS for any discharge medications will be authorized once you are discharged, as it is imperative that you return to your primary care physician (or establish a relationship with a primary care physician if you do not have one) for your aftercare needs so that they can reassess your need for medications and monitor your lab values.   Diet recommendation: Diabetic Diet Heart Healthy diet  Discharge Instructions    Call MD for:  extreme fatigue    Complete by:  As directed      Call MD for:  persistant dizziness or light-headedness    Complete by:  As directed      Call MD for:  persistant nausea and vomiting    Complete by:  As directed      Diet - low sodium heart healthy    Complete by:  As directed      Diet Carb Modified    Complete by:  As directed  Increase activity slowly    Complete by:  As directed            Follow-up Information    Follow up with WEBB, CAROL D, MD. Schedule an appointment as soon as possible for a visit in 2 weeks.   Specialty:  Family Medicine   Contact information:   [address] [place] [address] [phone]       Follow up with Earnie Larsson A, MD. Schedule an appointment as soon as possible for a visit in 2 weeks.   Specialty:  Neurosurgery   Why:  Hospital follow up   Contact information:   [address] [place] [address] [phone]       Follow up with Wyatt Portela, MD. Schedule an appointment as soon as possible for a visit in 1 week.   Specialty:  Ophthalmology   Contact information:   [address] [place] [address] [phone]      Total Time spent on discharge equals 45  minutes.  SignedOren Binet 02/20/2016 12:03 PM

## 2016-02-21 ENCOUNTER — Other Ambulatory Visit: Payer: Self-pay

## 2016-02-21 ENCOUNTER — Non-Acute Institutional Stay (SKILLED_NURSING_FACILITY): Payer: 59 | Admitting: Adult Health

## 2016-02-21 ENCOUNTER — Encounter: Payer: Self-pay | Admitting: Adult Health

## 2016-02-21 ENCOUNTER — Other Ambulatory Visit: Payer: 59

## 2016-02-21 DIAGNOSIS — E118 Type 2 diabetes mellitus with unspecified complications: Secondary | ICD-10-CM | POA: Diagnosis not present

## 2016-02-21 DIAGNOSIS — K5901 Slow transit constipation: Secondary | ICD-10-CM

## 2016-02-21 DIAGNOSIS — S0592XS Unspecified injury of left eye and orbit, sequela: Secondary | ICD-10-CM | POA: Diagnosis not present

## 2016-02-21 DIAGNOSIS — E46 Unspecified protein-calorie malnutrition: Secondary | ICD-10-CM

## 2016-02-21 DIAGNOSIS — F329 Major depressive disorder, single episode, unspecified: Secondary | ICD-10-CM | POA: Diagnosis not present

## 2016-02-21 DIAGNOSIS — I429 Cardiomyopathy, unspecified: Secondary | ICD-10-CM | POA: Diagnosis not present

## 2016-02-21 DIAGNOSIS — F32A Depression, unspecified: Secondary | ICD-10-CM

## 2016-02-21 DIAGNOSIS — G9340 Encephalopathy, unspecified: Secondary | ICD-10-CM | POA: Diagnosis not present

## 2016-02-21 DIAGNOSIS — I10 Essential (primary) hypertension: Secondary | ICD-10-CM

## 2016-02-21 DIAGNOSIS — Z794 Long term (current) use of insulin: Secondary | ICD-10-CM

## 2016-02-21 DIAGNOSIS — S069X0S Unspecified intracranial injury without loss of consciousness, sequela: Secondary | ICD-10-CM | POA: Diagnosis not present

## 2016-02-21 DIAGNOSIS — G629 Polyneuropathy, unspecified: Secondary | ICD-10-CM

## 2016-02-21 DIAGNOSIS — I428 Other cardiomyopathies: Secondary | ICD-10-CM

## 2016-02-21 DIAGNOSIS — R531 Weakness: Secondary | ICD-10-CM

## 2016-02-21 DIAGNOSIS — G250 Essential tremor: Secondary | ICD-10-CM

## 2016-02-21 NOTE — Progress Notes (Signed)
Patient ID: Dale Morrow, male   DOB: [birth year], 75 y.o.   MRN: [id number]    DATE:  02/21/2016   MRN:  [id number]  BIRTHDAY: May 27, 1941  Facility:  Nursing Home Location:  [place] Room Number: 207-P  LEVEL OF CARE:  SNF (31)  Contact Information    Name Relation Home Work [place] [address] [place] [phone]  [phone]       Code Status History    Date Active Date Inactive Code Status Order ID Comments User Context   02/18/2016 10:10 PM 02/20/2016 10:28 PM Full Code [id number]  Reubin Milan, MD ED   02/13/2016  4:38 PM 02/16/2016 10:05 PM Full Code [id number]  Lisette Abu, PA-C Inpatient   04/26/2015 11:03 AM 04/26/2015  6:01 PM Full Code [id number]  Jettie Booze, MD Inpatient   04/19/2015  2:43 AM 04/26/2015 11:03 AM Full Code [id number]  Corey Harold, NP ED   01/25/2015 12:10 PM 01/27/2015  2:28 PM Full Code [id number]  Gaynelle Arabian, MD Inpatient       Chief Complaint  Patient presents with  . Hospitalization Follow-up    HISTORY OF PRESENT ILLNESS:  This is a 75 year old male who has been admitted to [facility] on [facility]/20/16 [facility]. He has PMH of Osteoarthritis, depression, essential tremor, CVA, diabetes mellitus, nonischemic cardiomyopathy, chronic systolic CHF and GERD. He was recently discharged  (02/13/16) home from the hospital secondary to subdural hematoma. Wife brought him back to the hospital due to worsening condition. He was noted to have occasional dyspnea. Imaging showed mildly increased versus redistribution of subdural hematoma.  He was admitted for a short-term rehabilitation.  PAST MEDICAL HISTORY:  Past Medical History  Diagnosis Date  . Arthritis     left knee  . Depression   . De Quervain's tenosynovitis, left 04/2012  . Tremors of nervous system     takes propranolol for tremors  . Prostate cancer, primary, with metastasis from prostate to other site [facility])     a. metastasis to  lymph nodes;  b. s/p XRT.  [name] Complication of anesthesia     pt denies ponv, pt woke up during colonscopy in past  . Diabetes mellitus     Insulin pump, takes lisinopril for kidneys, dr Buddy Duty endocrinologist  . Left-sided Bell's palsy   . Right-sided Bell's palsy   . Nonischemic cardiomyopathy ([place])     a. 04/2015 Echo: EF 25-30%, mid apical, antseptal, anterolat, inflat, infsept, apical AK. ? takotsubo. No thrombus. PASP 48mmHg;  b. 04/2014 Lexi MV: EF 29%, fixed mod size and intensity mid-dist ant, apical, inferior scar w/ wma's;  c. 04/2015 Cath: LM nl, LAD 50p/m, LCX 25p, RI nl, RCA nl, RPAV 50d-->Med Rx.  . Chronic systolic CHF (congestive heart failure) ([place])     a. 04/2015 Echo: EF 25-30%.  [name] GERD (gastroesophageal reflux disease)   . Fever   . SDH (subdural hematoma) ([place])   . TBI (traumatic brain injury) ([place])     With [facility] and SDH  . Normocytic anemia   . Intracranial hemorrhage ([place])   . Aspiration pneumonia ([place])   . Status epilepticus ([place])     Due to hypoglycemia  . Essential tremor   . PONV (postoperative nausea and vomiting)   . Laceration of left eye     Traumatic, with significant swelling  . Seizure-like activity ([place])   . Takotsubo cardiomyopathy     H/O,  EF 55%     CURRENT MEDICATIONS: Reviewed  Patient's Medications  New Prescriptions   No medications on file  Previous Medications   CALCIUM-VITAMIN D PO    Take 1 tablet by mouth daily at 12 noon.   DORZOLAMIDE-TIMOLOL (COSOPT) 22.3-6.8 MG/ML OPHTHALMIC SOLUTION    Place 1 drop into the left eye 2 (two) times daily.   ESCITALOPRAM (LEXAPRO) 20 MG TABLET    Take 20 mg by mouth daily.   INSULIN ASPART (NOVOLOG) 100 UNIT/ML INJECTION    0-15 Units, Subcutaneous, 3 times daily with meals,  CBG < 70: implement hypoglycemia protocol CBG 70 - 120: 0 units CBG 121 - 150: 2 units CBG 151 - 200: 3 units CBG 201 - 250: 5 units CBG 251 - 300: 8 units CBG 301 - 350: 11 units CBG 351 - 400: 15 units CBG > 400: call MD    INSULIN ASPART (NOVOLOG) 100 UNIT/ML INJECTION    Inject 4 Units into the skin 3 (three) times daily with meals.   INSULIN GLARGINE (LANTUS) 100 UNIT/ML SOLOSTAR PEN    Inject 21 Units into the skin daily.   LISINOPRIL (PRINIVIL,ZESTRIL) 2.5 MG TABLET    TAKE 1 TABLET BY MOUTH TWICE DAILY   MULTIPLE VITAMINS-MINERALS (ONE-A-DAY MENS 50+ ADVANTAGE PO)    Take 1 tablet by mouth daily at 12 noon.   POLYETHYLENE GLYCOL (MIRALAX / GLYCOLAX) PACKET    Take 17 g by mouth at bedtime.   PREGABALIN (LYRICA) 100 MG CAPSULE    Take 1 capsule (100 mg total) by mouth 3 (three) times daily.   PRIMIDONE (MYSOLINE) 50 MG TABLET    TAKE 2 TABLETS BY MOUTH 3 TIMES DAILY.   PROPRANOLOL ER (INDERAL LA) 60 MG 24 HR CAPSULE    Take 1 capsule (60 mg total) by mouth daily.  Modified Medications   No medications on file  Discontinued Medications   POLYETHYLENE GLYCOL POWDER (GLYCOLAX/MIRALAX) POWDER    Take 17 g by mouth at bedtime.      Allergies  Allergen Reactions  . Bee Venom Anaphylaxis  . Shellfish Allergy Anaphylaxis  . Augmentin [Amoxicillin-Pot Clavulanate] Rash     REVIEW OF SYSTEMS:  GENERAL: no change in appetite, no fatigue, no weight changes, no fever, chills or weakness EYES: Denies change in vision, dry eyes, eye pain, itching or discharge EARS: Denies change in hearing, ringing in ears, or earache NOSE: Denies nasal congestion or epistaxis MOUTH and THROAT: Denies oral discomfort, gingival pain or bleeding, pain from teeth or hoarseness   RESPIRATORY: no cough, SOB, DOE, wheezing, hemoptysis CARDIAC: no chest pain, edema or palpitations GI: no abdominal pain, diarrhea, constipation, heart burn, nausea or vomiting GU: Denies dysuria, frequency, hematuria, incontinence, or discharge PSYCHIATRIC: Denies feeling of depression or anxiety. No report of hallucinations, insomnia, paranoia, or agitation   PHYSICAL EXAMINATION  GENERAL APPEARANCE: Well nourished. In no acute distress. Normal  body habitus SKIN:  Has bilateral periorbital ecchymosis, left eyebrow hematoma, left neck bruising HEAD: Normal in size and contour. No evidence of trauma EYES: Lids open and close normally. No blepharitis, entropion or ectropion. PERRL. Conjunctivae are clear and sclerae are white. Lenses are without opacity EARS: Pinnae are normal. Patient hears normal voice tunes of the examiner MOUTH and THROAT: Lips are without lesions. Oral mucosa is moist and without lesions. Tongue is normal in shape, size, and color and without lesions NECK: supple, trachea midline, no neck masses, no thyroid tenderness, no thyromegaly LYMPHATICS: no LAN in the  neck, no supraclavicular LAN RESPIRATORY: breathing is even & unlabored, BS CTAB CARDIAC: RRR, no murmur,no extra heart sounds, no edema GI: abdomen soft, normal BS, no masses, no tenderness, no hepatomegaly, no splenomegaly EXTREMITIES:  Able to move X 4 extremities PSYCHIATRIC: Alert to self, disoriented to time and place. Affect and behavior are appropriate  LABS/RADIOLOGY: Labs reviewed: Basic Metabolic Panel:  Recent Labs  04/19/15 0430  04/22/15 1150  02/18/16 1524 02/19/16 0550 02/20/16 0638  NA 137  < >  --   < > 133* 131* 134*  K 5.1  < >  --   < > 3.9 4.1 3.7  CL 104  < >  --   < > 95* 97* 96*  CO2 21*  < >  --   < > 26 25 25   GLUCOSE 183*  < >  --   < > 193* 269* 161*  BUN 20  < >  --   < > 13 12 13   CREATININE 0.85  < >  --   < > 0.83 0.75 0.80  CALCIUM 8.3*  < >  --   < > 8.7* 8.1* 8.1*  MG 2.2  --  1.9  --   --   --   --   PHOS 2.4*  --   --   --   --   --   --   < > = values in this interval not displayed. Liver Function Tests:  Recent Labs  02/18/16 1524 02/19/16 0550 02/20/16 0638  AST 29 26 29   ALT 17 16* 19  ALKPHOS 65 62 65  BILITOT 0.6 0.8 0.7  PROT 6.6 6.1* 5.9*  ALBUMIN 3.1* 2.9* 2.9*    Recent Labs  03/10/15 1604  LIPASE 23    Recent Labs  04/20/15 1018 04/22/15 1150  AMMONIA 94* 33    CBC:  Recent Labs  02/18/16 1524 02/19/16 0550 02/20/16 0638  WBC 7.9 6.8 7.1  NEUTROABS 5.9 5.1 5.2  HGB 10.8* 10.3* 10.5*  HCT 32.4* 30.3* 30.6*  MCV 91.0 92.1 91.9  PLT 255 244 246   Lipid Panel:  Recent Labs  04/26/15 1145  HDL 33*   Cardiac Enzymes:  Recent Labs  04/19/15 0130 04/20/15 0209 04/20/15 1306 02/18/16 1524  CKTOTAL 490* 378  --   --   TROPONINI  --   --  1.01* 0.03   CBG:  Recent Labs  02/20/16 0553 02/20/16 1200 02/20/16 1652  GLUCAP 144* 303* 143*      Dg Chest 1 View  02/13/2016  CLINICAL DATA:  Increasing leg pain after a fall yesterday. EXAM: CHEST 1 VIEW COMPARISON:  04/21/2015 FINDINGS: Shallow inspiration. Normal heart size and pulmonary vascularity. No focal airspace disease or consolidation in the lungs. No blunting of costophrenic angles. No pneumothorax. Mediastinal contours appear intact. IMPRESSION: No active disease. Electronically Signed   By: Lucienne Capers M.D.   On: 02/13/2016 04:44   Ct Head Wo Contrast  02/18/2016  CLINICAL DATA:  Altered mental status. Recent fall and subdural hematoma. Subsequent encounter EXAM: CT HEAD WITHOUT CONTRAST TECHNIQUE: Contiguous axial images were obtained from the base of the skull through the vertex without intravenous contrast. COMPARISON:  02/14/2016 FINDINGS: Skull and Sinuses:Swelling around the left face and frontal scalp is diminished. Negative for fracture or hemo sinus. Visualized orbits: Bilateral cataract resection.  No acute finding. Brain: Very similar volume and extent of intracranial hemorrhage as seen previously. There is scattered traumatic pattern subarachnoid blood greatest  in the anterior hemispheric sulci and low left sylvian fissure. Small volume intraventricular hemorrhage layering in the occipital horns of the lateral ventricles. No hydrocephalus or change in ventricular volume. There are bilateral predominantly low-density subdural collections with mild cortical mass  effect. The subdural clot in the right frontal region is mildly increased from prior, interval hemorrhage versus redistribution, measuring 4 mm in thickness today. No parenchymal hemorrhage or swelling. No acute infarct. IMPRESSION: Compared to 02/14/2016, re- distributed or mildly increased right frontal subdural hematoma without increased mass effect. Elsewhere stable intracranial hemorrhage that is subdural, subarachnoid, and intraventricular. Electronically Signed   By: Monte Fantasia M.D.   On: 02/18/2016 20:38   Ct Head Wo Contrast  02/14/2016  CLINICAL DATA:  Follow-up traumatic brain injury. EXAM: CT HEAD WITHOUT CONTRAST TECHNIQUE: Contiguous axial images were obtained from the base of the skull through the vertex without intravenous contrast. COMPARISON:  02/13/2016 FINDINGS: Multifocal acute intracranial hemorrhage again demonstrated. Acute subdural hemorrhage demonstrated focally along the anterior falx as well as in the left sylvian fissure region. Multifocal areas of intraparenchymal hemorrhage demonstrated predominate along the subcortical or cortical surfaces in the frontal lobes bilaterally along the convexity, and in the left parietal lobe. Mild intraventricular hemorrhage in the posterior horns of the lateral ventricles bilaterally. No significant progression in these areas since the previous study. Subdural Hemorrhage along the right anterior frontal region and subarachnoid hemorrhage along the parafalcine sulci is less distinct than on the previous study. mild cerebral atrophy. Mild ventricular dilatation is likely due to central atrophy. No mass effect or midline shift. Gray-white matter junctions are distinct. Large subcutaneous scalp hematoma in the left anterior frontal region extending to the temporal region and left side of the face is again demonstrated, slightly smaller than previous study. IMPRESSION: Multifocal acute intracranial hemorrhage, including subdural, subarachnoid,  intraparenchymal, intraventricular hemorrhage. No progression since previous study. Some areas are becoming less distinct. Electronically Signed   By: Lucienne Capers M.D.   On: 02/14/2016 05:49   Ct Head Wo Contrast  02/13/2016  CLINICAL DATA:  Initial evaluation for acute trauma, fall. EXAM: CT HEAD WITHOUT CONTRAST CT MAXILLOFACIAL WITHOUT CONTRAST CT CERVICAL SPINE WITHOUT CONTRAST TECHNIQUE: Multidetector CT imaging of the head, cervical spine, and maxillofacial structures were performed using the standard protocol without intravenous contrast. Multiplanar CT image reconstructions of the cervical spine and maxillofacial structures were also generated. COMPARISON:  Prior study from 04/19/2015. FINDINGS: CT HEAD FINDINGS Large left periorbital and frontal scalp contusion. Globes intact. No retro-orbital hematoma or other pathology. No calvarial fracture.  Mastoid air cells are clear. Age-related cerebral atrophy with chronic microvascular ischemic disease present. No acute large vessel territory infarct. Acute subdural/ extra-axial hemorrhage overlies the right frontal convexity measuring up to 11 mm. Scattered subdural blood along the falx with a left parafalcine subdural hematoma measuring up to 4 mm. There is associated scattered acute subarachnoid hemorrhage within the right frontal lobe and parasagittal left frontal parietal lobes. Subarachnoid blood along the superior aspect of the left sylvian fissure. 13 mm contusion at the left anterior temporal pole. Small amount of hemorrhage along the septum pellucidum measures up to 9 mm. Acute subarachnoid hemorrhage within the posterior right temporal lobe. No midline shift. No hydrocephalus. Small amount of layering intraventricular hemorrhage within the occipital horns bilaterally. Basilar cisterns remain patent. Superimposed chronic appearing bilateral subdural hygroma is measure up to 5-6 mm bilaterally. CT MAXILLOFACIAL FINDINGS Large left forehead/left  periorbital and facial contusion. Mild left-sided enophthalmos. The left globe  itself appears intact without retro-orbital hematoma or other pathology. Right globe intact without retro-orbital pathology. Bony orbits intact. No orbital floor fracture. Lamina papyracea intact. Orbital roofs intact. Zygomatic arches intact. No acute maxillary fracture. Pterygoid plates intact. No acute nasal bone fracture. Mandible intact. Mandibular condyles normally situated within the temporomandibular fossa. Paranasal and this is are largely clear. CT CERVICAL SPINE FINDINGS Trace anterolisthesis of C4 on C5, likely chronic. The vertebral bodies are otherwise normally aligned with preservation of the normal cervical lordosis. Vertebral body heights are preserved. Normal C1-2 articulations are intact. No prevertebral soft tissue swelling. No acute fracture or listhesis. Moderate degenerative spondylolysis at C5-6 and C6-7. Scatter multilevel facet arthrosis noted. Visualized soft tissues of the neck demonstrate no acute abnormality. Scattered vascular calcifications about the carotid bifurcations. Visualized lung apices are clear without evidence of apical pneumothorax. IMPRESSION: CT HEAD: 1. Acute traumatic subarachnoid hemorrhage involving both cerebral hemispheres as above, with additional 13 mm parenchymal contusion at the left temporal pole. 2. Small volume acute subdural hemorrhage overlying the right frontal convexity and falx. 3. Small volume acute intraventricular hemorrhage within the occipital horns of both lateral ventricles. This may be related to redistribution. No hydrocephalus or midline shift. 4. Large left frontal/periorbital scalp contusion. No calvarial fracture. CT MAXILLOFACIAL: 1. No acute maxillofacial fracture. 2. Large left forehead/periorbital and facial contusion. There is associated left enophthalmos. Left globe itself appears grossly intact without retro-orbital hematoma or other pathology. CT CERVICAL  SPINE: No acute traumatic injury within the cervical spine. Critical Value/emergent results were called by telephone at the time of interpretation on 02/13/2016 at 5:21 am to Dr. Veryl Speak , who verbally acknowledged these results. Electronically Signed   By: Jeannine Boga M.D.   On: 02/13/2016 05:23   Ct Cervical Spine Wo Contrast  02/13/2016  CLINICAL DATA:  Initial evaluation for acute trauma, fall. EXAM: CT HEAD WITHOUT CONTRAST CT MAXILLOFACIAL WITHOUT CONTRAST CT CERVICAL SPINE WITHOUT CONTRAST TECHNIQUE: Multidetector CT imaging of the head, cervical spine, and maxillofacial structures were performed using the standard protocol without intravenous contrast. Multiplanar CT image reconstructions of the cervical spine and maxillofacial structures were also generated. COMPARISON:  Prior study from 04/19/2015. FINDINGS: CT HEAD FINDINGS Large left periorbital and frontal scalp contusion. Globes intact. No retro-orbital hematoma or other pathology. No calvarial fracture.  Mastoid air cells are clear. Age-related cerebral atrophy with chronic microvascular ischemic disease present. No acute large vessel territory infarct. Acute subdural/ extra-axial hemorrhage overlies the right frontal convexity measuring up to 11 mm. Scattered subdural blood along the falx with a left parafalcine subdural hematoma measuring up to 4 mm. There is associated scattered acute subarachnoid hemorrhage within the right frontal lobe and parasagittal left frontal parietal lobes. Subarachnoid blood along the superior aspect of the left sylvian fissure. 13 mm contusion at the left anterior temporal pole. Small amount of hemorrhage along the septum pellucidum measures up to 9 mm. Acute subarachnoid hemorrhage within the posterior right temporal lobe. No midline shift. No hydrocephalus. Small amount of layering intraventricular hemorrhage within the occipital horns bilaterally. Basilar cisterns remain patent. Superimposed chronic  appearing bilateral subdural hygroma is measure up to 5-6 mm bilaterally. CT MAXILLOFACIAL FINDINGS Large left forehead/left periorbital and facial contusion. Mild left-sided enophthalmos. The left globe itself appears intact without retro-orbital hematoma or other pathology. Right globe intact without retro-orbital pathology. Bony orbits intact. No orbital floor fracture. Lamina papyracea intact. Orbital roofs intact. Zygomatic arches intact. No acute maxillary fracture. Pterygoid plates intact. No acute nasal  bone fracture. Mandible intact. Mandibular condyles normally situated within the temporomandibular fossa. Paranasal and this is are largely clear. CT CERVICAL SPINE FINDINGS Trace anterolisthesis of C4 on C5, likely chronic. The vertebral bodies are otherwise normally aligned with preservation of the normal cervical lordosis. Vertebral body heights are preserved. Normal C1-2 articulations are intact. No prevertebral soft tissue swelling. No acute fracture or listhesis. Moderate degenerative spondylolysis at C5-6 and C6-7. Scatter multilevel facet arthrosis noted. Visualized soft tissues of the neck demonstrate no acute abnormality. Scattered vascular calcifications about the carotid bifurcations. Visualized lung apices are clear without evidence of apical pneumothorax. IMPRESSION: CT HEAD: 1. Acute traumatic subarachnoid hemorrhage involving both cerebral hemispheres as above, with additional 13 mm parenchymal contusion at the left temporal pole. 2. Small volume acute subdural hemorrhage overlying the right frontal convexity and falx. 3. Small volume acute intraventricular hemorrhage within the occipital horns of both lateral ventricles. This may be related to redistribution. No hydrocephalus or midline shift. 4. Large left frontal/periorbital scalp contusion. No calvarial fracture. CT MAXILLOFACIAL: 1. No acute maxillofacial fracture. 2. Large left forehead/periorbital and facial contusion. There is  associated left enophthalmos. Left globe itself appears grossly intact without retro-orbital hematoma or other pathology. CT CERVICAL SPINE: No acute traumatic injury within the cervical spine. Critical Value/emergent results were called by telephone at the time of interpretation on 02/13/2016 at 5:21 am to Dr. Veryl Speak , who verbally acknowledged these results. Electronically Signed   By: Jeannine Boga M.D.   On: 02/13/2016 05:23   Dg Chest Port 1 View  02/18/2016  CLINICAL DATA:  Fever ear. EXAM: PORTABLE CHEST 1 VIEW COMPARISON:  02/13/2016 FINDINGS: The heart size and mediastinal contours are within normal limits. Both lungs are clear. The visualized skeletal structures are unremarkable. IMPRESSION: Normal chest x-ray. Electronically Signed   By: Marijo Sanes M.D.   On: 02/18/2016 16:36   Dg Hip Unilat With Pelvis 2-3 Views Left  02/13/2016  CLINICAL DATA:  Increasing pain after a fall. EXAM: DG HIP (WITH OR WITHOUT PELVIS) 2-3V LEFT COMPARISON:  None. FINDINGS: Pelvis and left hip appear intact. No evidence of acute fracture or dislocation. No focal bone lesion or bone destruction. Vascular calcifications. Surgical clips over the symphysis pubis. IMPRESSION: No acute bony abnormalities. Electronically Signed   By: Lucienne Capers M.D.   On: 02/13/2016 04:45   Dg Femur Min 2 Views Left  02/13/2016  CLINICAL DATA:  Increasing pain and difficulty weight-bearing after a fall late afternoon of 02/12/2016. EXAM: LEFT FEMUR 2 VIEWS COMPARISON:  None. FINDINGS: Left total knee arthroplasty. No evidence of acute fracture or dislocation. No focal bone lesion or bone destruction. Soft tissues are unremarkable. IMPRESSION: No acute bony abnormalities. Electronically Signed   By: Lucienne Capers M.D.   On: 02/13/2016 04:44   Ct Maxillofacial Wo Cm  02/13/2016  CLINICAL DATA:  Initial evaluation for acute trauma, fall. EXAM: CT HEAD WITHOUT CONTRAST CT MAXILLOFACIAL WITHOUT CONTRAST CT CERVICAL SPINE  WITHOUT CONTRAST TECHNIQUE: Multidetector CT imaging of the head, cervical spine, and maxillofacial structures were performed using the standard protocol without intravenous contrast. Multiplanar CT image reconstructions of the cervical spine and maxillofacial structures were also generated. COMPARISON:  Prior study from 04/19/2015. FINDINGS: CT HEAD FINDINGS Large left periorbital and frontal scalp contusion. Globes intact. No retro-orbital hematoma or other pathology. No calvarial fracture.  Mastoid air cells are clear. Age-related cerebral atrophy with chronic microvascular ischemic disease present. No acute large vessel territory infarct. Acute subdural/ extra-axial hemorrhage  overlies the right frontal convexity measuring up to 11 mm. Scattered subdural blood along the falx with a left parafalcine subdural hematoma measuring up to 4 mm. There is associated scattered acute subarachnoid hemorrhage within the right frontal lobe and parasagittal left frontal parietal lobes. Subarachnoid blood along the superior aspect of the left sylvian fissure. 13 mm contusion at the left anterior temporal pole. Small amount of hemorrhage along the septum pellucidum measures up to 9 mm. Acute subarachnoid hemorrhage within the posterior right temporal lobe. No midline shift. No hydrocephalus. Small amount of layering intraventricular hemorrhage within the occipital horns bilaterally. Basilar cisterns remain patent. Superimposed chronic appearing bilateral subdural hygroma is measure up to 5-6 mm bilaterally. CT MAXILLOFACIAL FINDINGS Large left forehead/left periorbital and facial contusion. Mild left-sided enophthalmos. The left globe itself appears intact without retro-orbital hematoma or other pathology. Right globe intact without retro-orbital pathology. Bony orbits intact. No orbital floor fracture. Lamina papyracea intact. Orbital roofs intact. Zygomatic arches intact. No acute maxillary fracture. Pterygoid plates intact.  No acute nasal bone fracture. Mandible intact. Mandibular condyles normally situated within the temporomandibular fossa. Paranasal and this is are largely clear. CT CERVICAL SPINE FINDINGS Trace anterolisthesis of C4 on C5, likely chronic. The vertebral bodies are otherwise normally aligned with preservation of the normal cervical lordosis. Vertebral body heights are preserved. Normal C1-2 articulations are intact. No prevertebral soft tissue swelling. No acute fracture or listhesis. Moderate degenerative spondylolysis at C5-6 and C6-7. Scatter multilevel facet arthrosis noted. Visualized soft tissues of the neck demonstrate no acute abnormality. Scattered vascular calcifications about the carotid bifurcations. Visualized lung apices are clear without evidence of apical pneumothorax. IMPRESSION: CT HEAD: 1. Acute traumatic subarachnoid hemorrhage involving both cerebral hemispheres as above, with additional 13 mm parenchymal contusion at the left temporal pole. 2. Small volume acute subdural hemorrhage overlying the right frontal convexity and falx. 3. Small volume acute intraventricular hemorrhage within the occipital horns of both lateral ventricles. This may be related to redistribution. No hydrocephalus or midline shift. 4. Large left frontal/periorbital scalp contusion. No calvarial fracture. CT MAXILLOFACIAL: 1. No acute maxillofacial fracture. 2. Large left forehead/periorbital and facial contusion. There is associated left enophthalmos. Left globe itself appears grossly intact without retro-orbital hematoma or other pathology. CT CERVICAL SPINE: No acute traumatic injury within the cervical spine. Critical Value/emergent results were called by telephone at the time of interpretation on 02/13/2016 at 5:21 am to Dr. Veryl Speak , who verbally acknowledged these results. Electronically Signed   By: Jeannine Boga M.D.   On: 02/13/2016 05:23    ASSESSMENT/PLAN:  Generalized weakness - for  rehabilitation, PT and OT  Encepalopathy - encephalopathy might be due to concussion; continue supportive care  TBI notable areas of SAH, SDH plus bilateral IVH - follow-up with neurosurgery, Dr. Annette Stable; check CBC  Left eye injury with laceration S/P suture - follow-up with Dr. Katy Fitch, ophthalmology  History of Takosubo's cardiomyopathy - follow-up with cardiology  Essential tremor - continue Inderal LA history milligrams 1 capsule by mouth daily and Mysoline 50 mg 2 tabs = 100 mg by mouth twice a day  Diabetes mellitus, type II - continue NovoLog 4 units 3 times a day with meals, NovoLog sliding scale 3 times a day and Lantus 21 units subcutaneous daily Lab Results  Component Value Date   HGBA1C 8.4* 02/13/2016    Depression - continue Lexapro 20 mg 1 tab by mouth daily  Constipation - continue MiraLAX 17 g by mouth daily at bedtime and start  Colace 100 mg 1 capsule BID  Neuropathy - continue Lyrica 100 mg 1 capsule by mouth 3 times a day  Hypertension - continue lisinopril 2.5 mg by mouth twice a day; check BMP  Protein-calorie malnutrition - albumin 3.1; start Procel 1 scoop BID  Normocytic anemia - hemoglobin 10.5; check CBC     Goals of care:  Short-term rehabilitation    Durenda Age, NP [place]

## 2016-02-21 NOTE — Patient Outreach (Signed)
[facility]) Care Management  02/21/2016  Dale Morrow 1940/12/03 [id number]   Phone call to wife to follow up on member.  Member was transferred to [facility] yesterday. No answer and message left.  Plan to call tomorrow to follow up. Peter Garter RN, [facility] Care Management Coordinator-Link to [place] Management [phone]

## 2016-02-22 ENCOUNTER — Other Ambulatory Visit: Payer: Self-pay

## 2016-02-22 ENCOUNTER — Encounter: Payer: Self-pay | Admitting: Internal Medicine

## 2016-02-22 LAB — BASIC METABOLIC PANEL
BUN: 14 mg/dL (ref 4–21)
CREATININE: 0.8 mg/dL (ref 0.6–1.3)
Glucose: 164 mg/dL
Potassium: 4.5 mmol/L (ref 3.4–5.3)
SODIUM: 135 mmol/L — AB (ref 137–147)

## 2016-02-22 LAB — CBC AND DIFFERENTIAL
HCT: 33 % — AB (ref 41–53)
Hemoglobin: 10.8 g/dL — AB (ref 13.5–17.5)
Platelets: 296 10*3/uL (ref 150–399)
WBC: 5.7 10^3/mL

## 2016-02-22 NOTE — Patient Outreach (Signed)
[facility]) Care Management  02/22/2016  BURNES SOULES April 21, 1941 [id number]  Wife [name] returned phone message from yesterday.  Member had been admitted for a fall with intracranial hemorrhage, facial hematoma and traumatic brain injury.  Member had hx of Type 1 DM and used an insulin pump. Wife states that member is at [facility] for rehab and to get stronger.  States that he is a little stronger today and standing better.  States he is still confused but a little clearer today.  States that the facility is managing his insulin with Lantus and bolus corrections at meal times.  States his blood sugars have been up and they have had his Lantus dose increased.  States that he will be at the facility at least 2-3 weeks and then go home with home health.  States she is planning to retire so she can be home with him more.  States that prior to his fall he had been doing very well.  States that they had been to [place] on vacation in which he rode a bike and he drove most of the trip back Instructed that [place] will contact her next week to check on members progress and instructed to call if she has any questions Plan to call in one week Peter Garter RN, [facility] Care Management Coordinator-Link to [place] Management [phone]

## 2016-02-23 ENCOUNTER — Other Ambulatory Visit: Payer: Self-pay | Admitting: *Deleted

## 2016-02-23 ENCOUNTER — Non-Acute Institutional Stay (SKILLED_NURSING_FACILITY): Payer: 59 | Admitting: Internal Medicine

## 2016-02-23 ENCOUNTER — Encounter: Payer: Self-pay | Admitting: Internal Medicine

## 2016-02-23 DIAGNOSIS — S065XAA Traumatic subdural hemorrhage with loss of consciousness status unknown, initial encounter: Secondary | ICD-10-CM

## 2016-02-23 DIAGNOSIS — R600 Localized edema: Secondary | ICD-10-CM | POA: Diagnosis not present

## 2016-02-23 DIAGNOSIS — G8929 Other chronic pain: Secondary | ICD-10-CM | POA: Diagnosis not present

## 2016-02-23 DIAGNOSIS — D649 Anemia, unspecified: Secondary | ICD-10-CM | POA: Diagnosis not present

## 2016-02-23 DIAGNOSIS — G250 Essential tremor: Secondary | ICD-10-CM

## 2016-02-23 DIAGNOSIS — M6281 Muscle weakness (generalized): Secondary | ICD-10-CM | POA: Diagnosis not present

## 2016-02-23 DIAGNOSIS — S0532XA Ocular laceration without prolapse or loss of intraocular tissue, left eye, initial encounter: Secondary | ICD-10-CM

## 2016-02-23 DIAGNOSIS — R2681 Unsteadiness on feet: Secondary | ICD-10-CM | POA: Diagnosis not present

## 2016-02-23 DIAGNOSIS — Z9181 History of falling: Secondary | ICD-10-CM | POA: Diagnosis not present

## 2016-02-23 DIAGNOSIS — R209 Unspecified disturbances of skin sensation: Secondary | ICD-10-CM | POA: Diagnosis not present

## 2016-02-23 DIAGNOSIS — S065X0A Traumatic subdural hemorrhage without loss of consciousness, initial encounter: Secondary | ICD-10-CM | POA: Diagnosis not present

## 2016-02-23 DIAGNOSIS — I6200 Nontraumatic subdural hemorrhage, unspecified: Secondary | ICD-10-CM | POA: Diagnosis not present

## 2016-02-23 DIAGNOSIS — R413 Other amnesia: Secondary | ICD-10-CM

## 2016-02-23 DIAGNOSIS — I629 Nontraumatic intracranial hemorrhage, unspecified: Secondary | ICD-10-CM | POA: Diagnosis not present

## 2016-02-23 DIAGNOSIS — S069X0S Unspecified intracranial injury without loss of consciousness, sequela: Secondary | ICD-10-CM | POA: Diagnosis not present

## 2016-02-23 DIAGNOSIS — R202 Paresthesia of skin: Secondary | ICD-10-CM

## 2016-02-23 DIAGNOSIS — R5381 Other malaise: Secondary | ICD-10-CM | POA: Diagnosis not present

## 2016-02-23 DIAGNOSIS — G40901 Epilepsy, unspecified, not intractable, with status epilepticus: Secondary | ICD-10-CM

## 2016-02-23 DIAGNOSIS — R531 Weakness: Secondary | ICD-10-CM | POA: Diagnosis not present

## 2016-02-23 DIAGNOSIS — E1065 Type 1 diabetes mellitus with hyperglycemia: Secondary | ICD-10-CM | POA: Diagnosis not present

## 2016-02-23 DIAGNOSIS — S065X9A Traumatic subdural hemorrhage with loss of consciousness of unspecified duration, initial encounter: Secondary | ICD-10-CM

## 2016-02-23 DIAGNOSIS — R262 Difficulty in walking, not elsewhere classified: Secondary | ICD-10-CM | POA: Diagnosis not present

## 2016-02-23 LAB — CULTURE, BLOOD (ROUTINE X 2)
CULTURE: NO GROWTH
CULTURE: NO GROWTH

## 2016-02-23 NOTE — Patient Outreach (Signed)
Telephone call made to Mrs. Arrendondo at [phone] to provide her with the [facility] ([place]) telephone number of [phone]. Made her aware that she can contact this agency to help assist her in finding a new insurance. She will be retiring and will eventually not have [employer]. Mrs.Cubero indicates she has been doing some research and has spoken to an insurance already. She is appreciative of the contact information as she plans on following up with [facility]. Made her aware that [facility] is a Herbalist as well. She indicates she has already spoken with Lenna Sciara, LTW RN Coordinator but is appreciative of the information for [employer] provided.  Marthenia Rolling, MSN-Ed, RN,BSN [facility] Liaison [phone]

## 2016-02-23 NOTE — Progress Notes (Signed)
Patient ID: Dale Morrow, male   DOB: 10-Mar-1941, 75 y.o.   MRN: [id number]   History and Physical     Location:  [place] Room Number: 568 Place of Service:  SNF (31)  PCP: Estill Dooms, MD Patient Care Team: Estill Dooms, MD as PCP - General (Internal Medicine) Dimitri Ped, RN as [place] Management  Extended Emergency Contact Information Primary Emergency Contact: Zettie Cooley Address: [address]          [place], [place] [address] Johnnette Litter of [place] Phone: [phone] Mobile Phone: [phone] Relation: Spouse  Goals of Care: Advanced Directive information Advanced Directives 02/23/2016  Does patient have an advance directive? No  Type of Advance Directive -  Does patient want to make changes to advanced directive? -  Copy of advanced directive(s) in chart? -      Chief Complaint  Patient presents with  . New Admit To SNF    following hospitalization 02/18/16 to 02/20/16 subdural hematoma    HPI: Patient is a 75 y.o. male seen 02/23/16 for admission on 02/20/16 to [facility] SNF following hospitalization 02/13/16 to 5/5/127 for right frontal SDH. He was then rehospitalized 02/18/16 to 02/20/16 for fever and increased weakness. No source was determined for the fever. Presumption was that it was due to atelectasis.  Patient has had TBI with SDH, SAD, and bilateral IVH. His prognosis is poor.   He is very deconditioned. He has weak legs and is using a walker.   He has right Bell's palsy with facial weakness since 1990.[name]  He has left upper eyebrow laceration.  Dr. Krista Blue follows hin for BET. He is treated with propranolol, primidone, and Lyrica.  He is insulin using diabetic. He had hypoglycemia and a seizure in 2016.  He is chronically confused.  Other chronic problems include HTN, and depression.    Past Medical History  Diagnosis Date  . Arthritis     left knee  . Depression   . De  Quervain's tenosynovitis, left 04/2012  . Tremors of nervous system     takes propranolol for tremors  . Prostate cancer, primary, with metastasis from prostate to other site [facility])     a. metastasis to lymph nodes;  b. s/p XRT.  [name] Complication of anesthesia     pt denies ponv, pt woke up during colonscopy in past  . Diabetes mellitus     Insulin pump, takes lisinopril for kidneys, dr Buddy Duty endocrinologist  . Left-sided Bell's palsy   . Right-sided Bell's palsy   . Nonischemic cardiomyopathy ([place])     a. 04/2015 Echo: EF 25-30%, mid apical, antseptal, anterolat, inflat, infsept, apical AK. ? takotsubo. No thrombus. PASP 19mHg;  b. 04/2014 Lexi MV: EF 29%, fixed mod size and intensity mid-dist ant, apical, inferior scar w/ wma's;  c. 04/2015 Cath: LM nl, LAD 50p/m, LCX 25p, RI nl, RCA nl, RPAV 50d-->Med Rx.  . Chronic systolic CHF (congestive heart failure) ([place]     a. 04/2015 Echo: EF 25-30%.  .Marland KitchenGERD (gastroesophageal reflux disease)   . Fever   . SDH (subdural hematoma) ([place]   . TBI (traumatic brain injury) ([place]     [facility]  . Normocytic anemia   . Intracranial hemorrhage ([place]   . Aspiration pneumonia ([place]   . Status epilepticus ([place]     Due to hypoglycemia  . Essential tremor   . PONV (postoperative nausea and vomiting)   .  Laceration of left eye     Traumatic, with significant swelling  . Seizure-like activity ([place])   . Takotsubo cardiomyopathy     H/O, EF 55%   Past Surgical History  Procedure Laterality Date  . Hemorrhoid surgery  1970s  . Anterior cruciate ligament repair      right knee  . Wrist arthroscopy  05/03/2010    right; and release of 1st dorsal compartment  . Knee arthrotomy  03/18/2008    left; with scar exc.  [name] Knee arthroscopy  09/25/2007; 04/15/2005    left  . Knee joint manipulation  06/29/2007    left  . Total knee arthroplasty  04/29/2007    left  . Dorsal compartment release  04/21/2012    Procedure: RELEASE DORSAL COMPARTMENT (DEQUERVAIN);   Surgeon: Cammie Sickle., MD;  Location: [facility];  Service: Orthopedics;  Laterality: Left;  left 1st dorsal compartment release left wrist   . Minor amputation of digit Left 07/21/2013    Procedure: LEFT SMALL REVISION AMPUTATION (MINOR PROCEDURE) ;  Surgeon: Schuyler Amor, MD;  Location: [place];  Service: Orthopedics;  Laterality: Left;  . Colonoscopy with propofol N/A 04/07/2014    Procedure: COLONOSCOPY WITH PROPOFOL;  Surgeon: Jeryl Columbia, MD;  Location: [facility];  Service: Endoscopy;  Laterality: N/A;  . Total knee revision Left 01/25/2015    Procedure: LEFT TOTAL KNEE  ARTHOPLASTY REVISION;  Surgeon: Gaynelle Arabian, MD;  Location: [facility];  Service: Orthopedics;  Laterality: Left;  . Cardiac catheterization N/A 04/26/2015    Procedure: Left Heart Cath and Coronary Angiography;  Surgeon: Jettie Booze, MD;  Location: [place] [facility];  Service: Cardiovascular;  Laterality: N/A;  . Cardiac catheterization  04/26/2015    Procedure: Intravascular Ultrasound/IVUS;  Surgeon: Jettie Booze, MD;  Location: [place] [facility];  Service: Cardiovascular;;  . Carpal tunnel release Left 07/05/2015    Procedure: LEFT CARPAL TUNNEL RELEASE;  Surgeon: Charlotte Crumb, MD;  Location: [place];  Service: Orthopedics;  Laterality: Left;  . Ulnar tunnel release Left 07/05/2015    Procedure: LEFT CUBITAL TUNNEL RELEASE;  Surgeon: Charlotte Crumb, MD;  Location: [place];  Service: Orthopedics;  Laterality: Left;    reports that he quit smoking about 36 years ago. His smoking use included Cigarettes. He quit after 15 years of use. He has never used smokeless tobacco. He reports that he does not drink alcohol or use illicit drugs. Social History   Social History  . Marital Status: Married    Spouse Name: Ozzy Bohlken  . Number of Children: o  . Years of Education: college   Occupational History  . retired Artist    Social History Main Topics  . Smoking status: Former Smoker -- 15 years    Types: Cigarettes    Quit date: 10/13/1979  . Smokeless tobacco: Never Used  . Alcohol Use: No  . Drug Use: No  . Sexual Activity: Not on file   Other Topics Concern  . Not on file   Social History Narrative   Patient lives at home with his wife [name]) and there four dogs.Ad[facility] to [facility]16   Retired-Sales Rrepresentative   Education two years of college.   Right handed.   Caffeine one cup of coffee daily.   Former smoker-stopped 1980   Alcohol none   FULL CODE    Functional Status Survey:    Family History  Problem Relation Age of Onset  . Heart Problems Father   . Heart attack Father   . Hypertension Mother   . Stroke Mother     Health Maintenance  Topic Date Due  . FOOT EXAM  02/10/1952  . ZOSTAVAX  02/09/2002  . PNA vac Low Risk Adult (1 of 2 - PCV13) 02/10/2007  . INFLUENZA VACCINE  05/14/2016  . HEMOGLOBIN A1C  08/15/2016  . OPHTHALMOLOGY EXAM  02/12/2017  . COLONOSCOPY  04/07/2024  . TETANUS/TDAP  02/12/2026    Allergies  Allergen Reactions  . Bee Venom Anaphylaxis  . Shellfish Allergy Anaphylaxis  . Augmentin [Amoxicillin-Pot Clavulanate] Rash      Medication List       This list is accurate as of: 02/23/16  2:53 PM.  Always use your most recent med list.               acetaminophen 325 MG tablet  Commonly known as:  TYLENOL  Take 650 mg by mouth. Take 2 tablets every 8 hours     CALCIUM-VITAMIN D PO  Take 1 tablet by mouth daily at 12 noon.     docusate sodium 100 MG capsule  Commonly known as:  COLACE  Take 100 mg by mouth. Take one capsule twice daily     dorzolamide-timolol 22.3-6.8 MG/ML ophthalmic solution  Commonly known as:  COSOPT  Place 1 drop into the left eye 2 (two) times daily.     escitalopram 20 MG tablet  Commonly known as:  LEXAPRO  Take 20 mg by mouth daily.     insulin aspart 100 UNIT/ML injection    Commonly known as:  novoLOG  0-15 Units, Subcutaneous, 3 times daily with meals,  CBG < 70: implement hypoglycemia protocol CBG 70 - 120: 0 units CBG 121 - 150: 2 units CBG 151 - 200: 3 units CBG 201 - 250: 5 units CBG 251 - 300: 8 units CBG 301 - 350: 11 units CBG 351 - 400: 15 units CBG > 400: call MD     insulin aspart 100 UNIT/ML injection  Commonly known as:  novoLOG  Inject 4 Units into the skin 3 (three) times daily with meals.     Insulin Glargine 100 UNIT/ML Solostar Pen  Commonly known as:  LANTUS  Inject 21 Units into the skin daily.     lisinopril 2.5 MG tablet  Commonly known as:  PRINIVIL,ZESTRIL  TAKE 1 TABLET BY MOUTH TWICE DAILY     ONE-A-DAY MENS 50+ ADVANTAGE PO  Take 1 tablet by mouth daily at 12 noon.     polyethylene glycol packet  Commonly known as:  MIRALAX / GLYCOLAX  Take 17 g by mouth at bedtime.     pregabalin 100 MG capsule  Commonly known as:  LYRICA  Take 1 capsule (100 mg total) by mouth 3 (three) times daily.     primidone 50 MG tablet  Commonly known as:  MYSOLINE  TAKE 2 TABLETS BY MOUTH 3 TIMES DAILY.     propranolol ER 60 MG 24 hr capsule  Commonly known as:  INDERAL LA  Take 1 capsule (60 mg total) by mouth daily.        Review of Systems  Constitutional: Positive for activity change. Negative for appetite change, fatigue, fever and unexpected weight change.       Genersalized weakness.  HENT: Negative for congestion, ear pain, hearing loss, rhinorrhea, sore throat, tinnitus, trouble swallowing and voice change.  Bell's pasy on right face  Eyes:       Corrective lenses  Respiratory: Positive for shortness of breath. Negative for cough, choking, chest tightness and wheezing.   Cardiovascular: Negative for chest pain, palpitations and leg swelling.  Gastrointestinal: Negative for abdominal distention, abdominal pain, constipation, diarrhea and nausea.  Endocrine: Negative for cold intolerance, heat intolerance, polydipsia,  polyphagia and polyuria.       Insulin using DM  Genitourinary: Negative for dysuria, frequency, testicular pain and urgency.       Prostate cancer with met to lymph node.  Musculoskeletal: Positive for gait problem. Negative for arthralgias, back pain, myalgias and neck pain.       Weak legs. Uses walker. Left TKR.  Skin: Negative for color change, pallor and rash.  Allergic/Immunologic: Negative.   Neurological: Positive for tremors. Negative for dizziness, syncope, speech difficulty, weakness, numbness and headaches.       Bell's palsy right face. SDH, [place], IUVH.  Hematological: Negative for adenopathy. Does not bruise/bleed easily.  Psychiatric/Behavioral: Negative for behavioral problems, confusion, decreased concentration, hallucinations and sleep disturbance. The patient is not nervous/anxious.     Filed Vitals:   02/23/16 1438  BP: 111/60  Pulse: 50  Temp: 97.3 F (36.3 C)  Resp: 16  Height: 5' 8"  (1.727 m)  Weight: 177 lb (80.287 kg)  SpO2: 98%   Body mass index is 26.92 kg/(m^2). Physical Exam  Constitutional: He appears well-developed and well-nourished. No distress.  HENT:  Right Ear: External ear normal.  Left Ear: External ear normal.  Nose: Nose normal.  Mouth/Throat: Oropharynx is clear and moist. No oropharyngeal exudate.  Eyes: EOM are normal.  Left subconjunctival hemorrhage. Left eye puffed shut.  Neck: No JVD present. No tracheal deviation present. No thyromegaly present.  Cardiovascular: Normal rate, regular rhythm, normal heart sounds and intact distal pulses.  Exam reveals no gallop and no friction rub.   No murmur heard. Pulmonary/Chest: No respiratory distress. He has no wheezes. He has no rales. He exhibits no tenderness.  Abdominal: He exhibits no distension and no mass. There is no tenderness.  Musculoskeletal: Normal range of motion. He exhibits no edema or tenderness.  Lymphadenopathy:    He has no cervical adenopathy.  Neurological: He is  alert. He has normal reflexes. No cranial nerve deficit. Coordination normal.  Right Bell's Palsy. BET bilaterally.  Skin: No rash noted. No erythema. No pallor.  Bilateral periorbital bruises.  Psychiatric: He has a normal mood and affect. His behavior is normal.  confusion    Labs reviewed: Basic Metabolic Panel:  Recent Labs  04/19/15 0430  04/22/15 1150  02/18/16 1524 02/19/16 0550 02/20/16 0638  NA 137  < >  --   < > 133* 131* 134*  K 5.1  < >  --   < > 3.9 4.1 3.7  CL 104  < >  --   < > 95* 97* 96*  CO2 21*  < >  --   < > 26 25 25   GLUCOSE 183*  < >  --   < > 193* 269* 161*  BUN 20  < >  --   < > 13 12 13   CREATININE 0.85  < >  --   < > 0.83 0.75 0.80  CALCIUM 8.3*  < >  --   < > 8.7* 8.1* 8.1*  MG 2.2  --  1.9  --   --   --   --   PHOS 2.4*  --   --   --   --   --   --   < > =  values in this interval not displayed. Liver Function Tests:  Recent Labs  02/18/16 1524 02/19/16 0550 02/20/16 0638  AST 29 26 29   ALT 17 16* 19  ALKPHOS 65 62 65  BILITOT 0.6 0.8 0.7  PROT 6.6 6.1* 5.9*  ALBUMIN 3.1* 2.9* 2.9*    Recent Labs  03/10/15 1604  LIPASE 23    Recent Labs  04/20/15 1018 04/22/15 1150  AMMONIA 94* 33   CBC:  Recent Labs  02/18/16 1524 02/19/16 0550 02/20/16 0638  WBC 7.9 6.8 7.1  NEUTROABS 5.9 5.1 5.2  HGB 10.8* 10.3* 10.5*  HCT 32.4* 30.3* 30.6*  MCV 91.0 92.1 91.9  PLT 255 244 246   Cardiac Enzymes:  Recent Labs  04/19/15 0130 04/20/15 0209 04/20/15 1306 02/18/16 1524  CKTOTAL 490* 378  --   --   TROPONINI  --   --  1.01* 0.03   BNP: Invalid input(s): POCBNP Lab Results  Component Value Date   HGBA1C 8.4* 02/13/2016   Lab Results  Component Value Date   TSH 0.551 04/20/2015   Lab Results  Component Value Date   UOHFGBMS11 155 04/22/2015   No results found for: FOLATE No results found for: IRON, TIBC, FERRITIN  Imaging and Procedures obtained prior to SNF admission: Ct Head Wo Contrast  02/18/2016  CLINICAL  DATA:  Altered mental status. Recent fall and subdural hematoma. Subsequent encounter EXAM: CT HEAD WITHOUT CONTRAST TECHNIQUE: Contiguous axial images were obtained from the base of the skull through the vertex without intravenous contrast. COMPARISON:  02/14/2016 FINDINGS: Skull and Sinuses:Swelling around the left face and frontal scalp is diminished. Negative for fracture or hemo sinus. Visualized orbits: Bilateral cataract resection.  No acute finding. Brain: Very similar volume and extent of intracranial hemorrhage as seen previously. There is scattered traumatic pattern subarachnoid blood greatest in the anterior hemispheric sulci and low left sylvian fissure. Small volume intraventricular hemorrhage layering in the occipital horns of the lateral ventricles. No hydrocephalus or change in ventricular volume. There are bilateral predominantly low-density subdural collections with mild cortical mass effect. The subdural clot in the right frontal region is mildly increased from prior, interval hemorrhage versus redistribution, measuring 4 mm in thickness today. No parenchymal hemorrhage or swelling. No acute infarct. IMPRESSION: Compared to 02/14/2016, re- distributed or mildly increased right frontal subdural hematoma without increased mass effect. Elsewhere stable intracranial hemorrhage that is subdural, subarachnoid, and intraventricular. Electronically Signed   By: Monte Fantasia M.D.   On: 02/18/2016 20:38   Dg Chest Port 1 View  02/18/2016  CLINICAL DATA:  Fever ear. EXAM: PORTABLE CHEST 1 VIEW COMPARISON:  02/13/2016 FINDINGS: The heart size and mediastinal contours are within normal limits. Both lungs are clear. The visualized skeletal structures are unremarkable. IMPRESSION: Normal chest x-ray. Electronically Signed   By: Marijo Sanes M.D.   On: 02/18/2016 16:36    Assessment/Plan  1. Traumatic brain injury, without loss of consciousness, sequela ([place]) Admitted for STR for strengthening,  improved safe mobility, improved self care skills.  2. Normocytic anemia Follow lab  3. SDH (subdural hematoma) ([facility])  4. Intracranial hemorrhage ([place])  5. Memory loss May interfere with rehab process.  6. Laceration of left eye, initial encounter Healing without complaication  7. Facial paresthesia/chronic on left after Bells Palsy Chronic condition  8. Essential tremor The current medical regimen is effective;  continue present plan and medications.  9. Uncontrolled type 1 diabetes mellitus with hyperglycemia ([place]) monitor lab.  10. Status epilepticus ([place]) due  to hypoglycemia Not currently on antiseizure medication.

## 2016-02-26 DIAGNOSIS — R2681 Unsteadiness on feet: Secondary | ICD-10-CM | POA: Diagnosis not present

## 2016-02-26 DIAGNOSIS — R262 Difficulty in walking, not elsewhere classified: Secondary | ICD-10-CM | POA: Diagnosis not present

## 2016-02-26 DIAGNOSIS — R600 Localized edema: Secondary | ICD-10-CM | POA: Diagnosis not present

## 2016-02-26 DIAGNOSIS — M6281 Muscle weakness (generalized): Secondary | ICD-10-CM | POA: Diagnosis not present

## 2016-02-26 DIAGNOSIS — Z9181 History of falling: Secondary | ICD-10-CM | POA: Diagnosis not present

## 2016-02-26 DIAGNOSIS — R5381 Other malaise: Secondary | ICD-10-CM | POA: Diagnosis not present

## 2016-02-26 DIAGNOSIS — G8929 Other chronic pain: Secondary | ICD-10-CM | POA: Diagnosis not present

## 2016-02-26 DIAGNOSIS — R531 Weakness: Secondary | ICD-10-CM | POA: Diagnosis not present

## 2016-02-26 DIAGNOSIS — S065X0A Traumatic subdural hemorrhage without loss of consciousness, initial encounter: Secondary | ICD-10-CM | POA: Diagnosis not present

## 2016-02-27 ENCOUNTER — Non-Acute Institutional Stay (SKILLED_NURSING_FACILITY): Payer: 59 | Admitting: Adult Health

## 2016-02-27 ENCOUNTER — Other Ambulatory Visit: Payer: 59

## 2016-02-27 ENCOUNTER — Encounter: Payer: Self-pay | Admitting: Adult Health

## 2016-02-27 DIAGNOSIS — R531 Weakness: Secondary | ICD-10-CM | POA: Diagnosis not present

## 2016-02-27 DIAGNOSIS — M6281 Muscle weakness (generalized): Secondary | ICD-10-CM | POA: Diagnosis not present

## 2016-02-27 DIAGNOSIS — R600 Localized edema: Secondary | ICD-10-CM | POA: Diagnosis not present

## 2016-02-27 DIAGNOSIS — R262 Difficulty in walking, not elsewhere classified: Secondary | ICD-10-CM | POA: Diagnosis not present

## 2016-02-27 DIAGNOSIS — Z9181 History of falling: Secondary | ICD-10-CM | POA: Diagnosis not present

## 2016-02-27 DIAGNOSIS — I951 Orthostatic hypotension: Secondary | ICD-10-CM

## 2016-02-27 DIAGNOSIS — R2681 Unsteadiness on feet: Secondary | ICD-10-CM | POA: Diagnosis not present

## 2016-02-27 DIAGNOSIS — S065X0A Traumatic subdural hemorrhage without loss of consciousness, initial encounter: Secondary | ICD-10-CM | POA: Diagnosis not present

## 2016-02-27 DIAGNOSIS — R5381 Other malaise: Secondary | ICD-10-CM | POA: Diagnosis not present

## 2016-02-27 DIAGNOSIS — G8929 Other chronic pain: Secondary | ICD-10-CM | POA: Diagnosis not present

## 2016-02-27 NOTE — Progress Notes (Signed)
Patient ID: Dale Morrow, male   DOB: 10/07/1941, 75 y.o.   MRN: [id number]    DATE:   02/27/16  MRN:  [id number]  BIRTHDAY: 10/11/1941  Facility:  Nursing Home Location:  [place] Room Number: [id number]  LEVEL OF CARE:  SNF (31)  Contact Information    Name Relation Home Work [place] [address] [place] [phone]  [phone]       Code Status History    Date Active Date Inactive Code Status Order ID Comments User Context   02/18/2016 10:10 PM 02/20/2016 10:28 PM Full Code [id number]  Reubin Milan, MD ED   02/13/2016  4:38 PM 02/16/2016 10:05 PM Full Code [id number]  Lisette Abu, PA-C Inpatient   04/26/2015 11:03 AM 04/26/2015  6:01 PM Full Code [id number]  Jettie Booze, MD Inpatient   04/19/2015  2:43 AM 04/26/2015 11:03 AM Full Code [id number]  Corey Harold, NP ED   01/25/2015 12:10 PM 01/27/2015  2:28 PM Full Code [id number]  Gaynelle Arabian, MD Inpatient       Chief Complaint  Patient presents with  . Acute Visit    Orthostatic hypotension    HISTORY OF PRESENT ILLNESS:  This is a 75 year old male who has been noted to have varying BPs - seated BP 110/63, standing BPs 89/53, 85/54, 80/50. He complained of dizziness. He is currently taking Inderal LA 60 mg daily for tremors.  He has been admitted to [facility] on 02/20/16 from [facility]. He has PMH of Osteoarthritis, depression, essential tremor, CVA, diabetes mellitus, nonischemic cardiomyopathy, chronic systolic CHF and GERD. He was recently discharged  (02/13/16) home from the hospital secondary to subdural hematoma. Wife brought him back to the hospital due to worsening condition. He was noted to have occasional dyspnea. Imaging showed mildly increased versus redistribution of subdural hematoma.  He is currently having a short-term rehabilitation.  PAST MEDICAL HISTORY:  Past Medical History  Diagnosis Date  . Arthritis     left knee  . Depression   . De  Quervain's tenosynovitis, left 04/2012  . Tremors of nervous system     takes propranolol for tremors  . Prostate cancer, primary, with metastasis from prostate to other site [facility])     a. metastasis to lymph nodes;  b. s/p XRT.  [name] Complication of anesthesia     pt denies ponv, pt woke up during colonscopy in past  . Diabetes mellitus     Insulin pump, takes lisinopril for kidneys, dr Buddy Duty endocrinologist  . Left-sided Bell's palsy   . Right-sided Bell's palsy   . Nonischemic cardiomyopathy ([place])     a. 04/2015 Echo: EF 25-30%, mid apical, antseptal, anterolat, inflat, infsept, apical AK. ? takotsubo. No thrombus. PASP 75mmHg;  b. 04/2014 Lexi MV: EF 29%, fixed mod size and intensity mid-dist ant, apical, inferior scar w/ wma's;  c. 04/2015 Cath: LM nl, LAD 50p/m, LCX 25p, RI nl, RCA nl, RPAV 50d-->Med Rx.  . Chronic systolic CHF (congestive heart failure) ([place])     a. 04/2015 Echo: EF 25-30%.  [name] GERD (gastroesophageal reflux disease)   . Fever   . SDH (subdural hematoma) ([place])   . TBI (traumatic brain injury) ([place])     With [facility] and SDH  . Normocytic anemia   . Intracranial hemorrhage ([place])   . Aspiration pneumonia ([place])   . Status epilepticus ([place])     Due to hypoglycemia  . Essential tremor   .  PONV (postoperative nausea and vomiting)   . Laceration of left eye     Traumatic, with significant swelling  . Seizure-like activity ([place])   . Takotsubo cardiomyopathy     H/O, EF 55%     CURRENT MEDICATIONS: Reviewed  Patient's Medications  New Prescriptions   No medications on file  Previous Medications   ACETAMINOPHEN (TYLENOL) 325 MG TABLET    Take 650 mg by mouth. Take 2 tablets every 8 hours   CALCIUM-VITAMIN D PO    Take 1 tablet by mouth daily at 12 noon.   DOCUSATE SODIUM (COLACE) 100 MG CAPSULE    Take 100 mg by mouth. Take one capsule twice daily   DORZOLAMIDE-TIMOLOL (COSOPT) 22.3-6.8 MG/ML OPHTHALMIC SOLUTION    Place 1 drop into the left eye 2 (two) times daily.    ESCITALOPRAM (LEXAPRO) 20 MG TABLET    Take 20 mg by mouth daily.   INSULIN ASPART (NOVOLOG) 100 UNIT/ML INJECTION    0-15 Units, Subcutaneous, 3 times daily with meals,  CBG < 70: implement hypoglycemia protocol CBG 70 - 120: 0 units CBG 121 - 150: 2 units CBG 151 - 200: 3 units CBG 201 - 250: 5 units CBG 251 - 300: 8 units CBG 301 - 350: 11 units CBG 351 - 400: 15 units CBG > 400: call MD   INSULIN ASPART (NOVOLOG) 100 UNIT/ML INJECTION    Inject 4 Units into the skin 3 (three) times daily with meals.   INSULIN GLARGINE (LANTUS) 100 UNIT/ML SOLOSTAR PEN    Inject 21 Units into the skin daily.   MELATONIN 3 MG TABS    Take 3 mg by mouth at bedtime.   MULTIPLE VITAMINS-MINERALS (ONE-A-DAY MENS 50+ ADVANTAGE PO)    Take 1 tablet by mouth daily at 12 noon.   POLYETHYLENE GLYCOL (MIRALAX / GLYCOLAX) PACKET    Take 17 g by mouth at bedtime.   PREGABALIN (LYRICA) 100 MG CAPSULE    Take 1 capsule (100 mg total) by mouth 3 (three) times daily.   PRIMIDONE (MYSOLINE) 50 MG TABLET    TAKE 2 TABLETS BY MOUTH 3 TIMES DAILY.   PROPRANOLOL (INDERAL) 10 MG TABLET    Take 10 mg by mouth 2 (two) times daily.   PROTEIN (PROCEL) POWD    Take 1 scoop by mouth 2 (two) times daily.  Modified Medications   No medications on file  Discontinued Medications   LISINOPRIL (PRINIVIL,ZESTRIL) 2.5 MG TABLET    TAKE 1 TABLET BY MOUTH TWICE DAILY   PROPRANOLOL (INDERAL) 10 MG TABLET    Take 10 mg by mouth 2 (two) times daily.   PROPRANOLOL ER (INDERAL LA) 60 MG 24 HR CAPSULE    Take 1 capsule (60 mg total) by mouth daily.     Allergies  Allergen Reactions  . Bee Venom Anaphylaxis  . Shellfish Allergy Anaphylaxis  . Augmentin [Amoxicillin-Pot Clavulanate] Rash     REVIEW OF SYSTEMS:  GENERAL: no change in appetite, no fatigue, no weight changes, no fever, chills or weakness EYES: Denies change in vision, dry eyes, eye pain, itching or discharge EARS: Denies change in hearing, ringing in ears, or  earache NOSE: Denies nasal congestion or epistaxis MOUTH and THROAT: Denies oral discomfort, gingival pain or bleeding, pain from teeth or hoarseness   RESPIRATORY: no cough, SOB, DOE, wheezing, hemoptysis CARDIAC: no chest pain, edema or palpitations GI: no abdominal pain, diarrhea, constipation, heart burn, nausea or vomiting GU: Denies dysuria, frequency, hematuria, incontinence, or discharge  PSYCHIATRIC: Denies feeling of depression or anxiety. No report of hallucinations, insomnia, paranoia, or agitation   PHYSICAL EXAMINATION  GENERAL APPEARANCE: Well nourished. In no acute distress. Normal body habitus SKIN:  Has bilateral periorbital ecchymosis, left eyebrow hematoma, left neck bruising HEAD: Normal in size and contour. No evidence of trauma EYES: Lids open and close normally. No blepharitis, entropion or ectropion. PERRL. Conjunctivae are clear and sclerae are white. Lenses are without opacity EARS: Pinnae are normal. Patient hears normal voice tunes of the examiner MOUTH and THROAT: Lips are without lesions. Oral mucosa is moist and without lesions. Tongue is normal in shape, size, and color and without lesions NECK: supple, trachea midline, no neck masses, no thyroid tenderness, no thyromegaly LYMPHATICS: no LAN in the neck, no supraclavicular LAN RESPIRATORY: breathing is even & unlabored, BS CTAB CARDIAC: RRR, no murmur,no extra heart sounds, no edema GI: abdomen soft, normal BS, no masses, no tenderness, no hepatomegaly, no splenomegaly EXTREMITIES:  Able to move X 4 extremities PSYCHIATRIC: Alert to self, disoriented to time and place. Affect and behavior are appropriate  LABS/RADIOLOGY: Labs reviewed: Basic Metabolic Panel:  Recent Labs  04/19/15 0430  04/22/15 1150  02/18/16 1524 02/19/16 0550 02/20/16 0638 02/22/16  NA 137  < >  --   < > 133* 131* 134* 135*  K 5.1  < >  --   < > 3.9 4.1 3.7 4.5  CL 104  < >  --   < > 95* 97* 96*  --   CO2 21*  < >  --   < >  26 25 25   --   GLUCOSE 183*  < >  --   < > 193* 269* 161*  --   BUN 20  < >  --   < > 13 12 13 14   CREATININE 0.85  < >  --   < > 0.83 0.75 0.80 0.8  CALCIUM 8.3*  < >  --   < > 8.7* 8.1* 8.1*  --   MG 2.2  --  1.9  --   --   --   --   --   PHOS 2.4*  --   --   --   --   --   --   --   < > = values in this interval not displayed. Liver Function Tests:  Recent Labs  02/18/16 1524 02/19/16 0550 02/20/16 0638  AST 29 26 29   ALT 17 16* 19  ALKPHOS 65 62 65  BILITOT 0.6 0.8 0.7  PROT 6.6 6.1* 5.9*  ALBUMIN 3.1* 2.9* 2.9*     Recent Labs  04/20/15 1018 04/22/15 1150  AMMONIA 94* 33   CBC:  Recent Labs  02/18/16 1524 02/19/16 0550 02/20/16 0638 02/22/16  WBC 7.9 6.8 7.1 5.7  NEUTROABS 5.9 5.1 5.2  --   HGB 10.8* 10.3* 10.5* 10.8*  HCT 32.4* 30.3* 30.6* 33*  MCV 91.0 92.1 91.9  --   PLT 255 244 246 296   Lipid Panel:  Recent Labs  04/26/15 1145  HDL 33*   Cardiac Enzymes:  Recent Labs  04/19/15 0130 04/20/15 0209 04/20/15 1306 02/18/16 1524  CKTOTAL 490* 378  --   --   TROPONINI  --   --  1.01* 0.03   CBG:  Recent Labs  02/20/16 0553 02/20/16 1200 02/20/16 1652  GLUCAP 144* 303* 143*      Ct Head Wo Contrast  02/18/2016  CLINICAL DATA:  Altered mental status.  Recent fall and subdural hematoma. Subsequent encounter EXAM: CT HEAD WITHOUT CONTRAST TECHNIQUE: Contiguous axial images were obtained from the base of the skull through the vertex without intravenous contrast. COMPARISON:  02/14/2016 FINDINGS: Skull and Sinuses:Swelling around the left face and frontal scalp is diminished. Negative for fracture or hemo sinus. Visualized orbits: Bilateral cataract resection.  No acute finding. Brain: Very similar volume and extent of intracranial hemorrhage as seen previously. There is scattered traumatic pattern subarachnoid blood greatest in the anterior hemispheric sulci and low left sylvian fissure. Small volume intraventricular hemorrhage layering in the  occipital horns of the lateral ventricles. No hydrocephalus or change in ventricular volume. There are bilateral predominantly low-density subdural collections with mild cortical mass effect. The subdural clot in the right frontal region is mildly increased from prior, interval hemorrhage versus redistribution, measuring 4 mm in thickness today. No parenchymal hemorrhage or swelling. No acute infarct. IMPRESSION: Compared to 02/14/2016, re- distributed or mildly increased right frontal subdural hematoma without increased mass effect. Elsewhere stable intracranial hemorrhage that is subdural, subarachnoid, and intraventricular. Electronically Signed   By: Monte Fantasia M.D.   On: 02/18/2016 20:38   Ct Head Wo Contrast  02/14/2016  CLINICAL DATA:  Follow-up traumatic brain injury. EXAM: CT HEAD WITHOUT CONTRAST TECHNIQUE: Contiguous axial images were obtained from the base of the skull through the vertex without intravenous contrast. COMPARISON:  02/13/2016 FINDINGS: Multifocal acute intracranial hemorrhage again demonstrated. Acute subdural hemorrhage demonstrated focally along the anterior falx as well as in the left sylvian fissure region. Multifocal areas of intraparenchymal hemorrhage demonstrated predominate along the subcortical or cortical surfaces in the frontal lobes bilaterally along the convexity, and in the left parietal lobe. Mild intraventricular hemorrhage in the posterior horns of the lateral ventricles bilaterally. No significant progression in these areas since the previous study. Subdural Hemorrhage along the right anterior frontal region and subarachnoid hemorrhage along the parafalcine sulci is less distinct than on the previous study. mild cerebral atrophy. Mild ventricular dilatation is likely due to central atrophy. No mass effect or midline shift. Gray-white matter junctions are distinct. Large subcutaneous scalp hematoma in the left anterior frontal region extending to the temporal  region and left side of the face is again demonstrated, slightly smaller than previous study. IMPRESSION: Multifocal acute intracranial hemorrhage, including subdural, subarachnoid, intraparenchymal, intraventricular hemorrhage. No progression since previous study. Some areas are becoming less distinct. Electronically Signed   By: Lucienne Capers M.D.   On: 02/14/2016 05:49   Dg Chest Port 1 View  02/18/2016  CLINICAL DATA:  Fever ear. EXAM: PORTABLE CHEST 1 VIEW COMPARISON:  02/13/2016 FINDINGS: The heart size and mediastinal contours are within normal limits. Both lungs are clear. The visualized skeletal structures are unremarkable. IMPRESSION: Normal chest x-ray. Electronically Signed   By: Marijo Sanes M.D.   On: 02/18/2016 16:36     ASSESSMENT/PLAN:  Essential tremor - discontinue Inderal LA 60 mg daily; start Inderal 10 mg 1 tab PO BID; monitor orthostatic BP; fall precautions    Durenda Age, NP [facility] [phone]

## 2016-02-28 DIAGNOSIS — Z9181 History of falling: Secondary | ICD-10-CM | POA: Diagnosis not present

## 2016-02-28 DIAGNOSIS — S065X0A Traumatic subdural hemorrhage without loss of consciousness, initial encounter: Secondary | ICD-10-CM | POA: Diagnosis not present

## 2016-02-28 DIAGNOSIS — G8929 Other chronic pain: Secondary | ICD-10-CM | POA: Diagnosis not present

## 2016-02-28 DIAGNOSIS — R600 Localized edema: Secondary | ICD-10-CM | POA: Diagnosis not present

## 2016-02-28 DIAGNOSIS — R262 Difficulty in walking, not elsewhere classified: Secondary | ICD-10-CM | POA: Diagnosis not present

## 2016-02-28 DIAGNOSIS — M6281 Muscle weakness (generalized): Secondary | ICD-10-CM | POA: Diagnosis not present

## 2016-02-28 DIAGNOSIS — R2681 Unsteadiness on feet: Secondary | ICD-10-CM | POA: Diagnosis not present

## 2016-02-28 DIAGNOSIS — R531 Weakness: Secondary | ICD-10-CM | POA: Diagnosis not present

## 2016-02-28 DIAGNOSIS — R5381 Other malaise: Secondary | ICD-10-CM | POA: Diagnosis not present

## 2016-02-29 ENCOUNTER — Other Ambulatory Visit: Payer: Self-pay

## 2016-02-29 DIAGNOSIS — R2681 Unsteadiness on feet: Secondary | ICD-10-CM | POA: Diagnosis not present

## 2016-02-29 DIAGNOSIS — R5381 Other malaise: Secondary | ICD-10-CM | POA: Diagnosis not present

## 2016-02-29 DIAGNOSIS — R600 Localized edema: Secondary | ICD-10-CM | POA: Diagnosis not present

## 2016-02-29 DIAGNOSIS — S065X0A Traumatic subdural hemorrhage without loss of consciousness, initial encounter: Secondary | ICD-10-CM | POA: Diagnosis not present

## 2016-02-29 DIAGNOSIS — R262 Difficulty in walking, not elsewhere classified: Secondary | ICD-10-CM | POA: Diagnosis not present

## 2016-02-29 DIAGNOSIS — Z9181 History of falling: Secondary | ICD-10-CM | POA: Diagnosis not present

## 2016-02-29 DIAGNOSIS — M6281 Muscle weakness (generalized): Secondary | ICD-10-CM | POA: Diagnosis not present

## 2016-02-29 DIAGNOSIS — R531 Weakness: Secondary | ICD-10-CM | POA: Diagnosis not present

## 2016-02-29 DIAGNOSIS — G8929 Other chronic pain: Secondary | ICD-10-CM | POA: Diagnosis not present

## 2016-02-29 NOTE — Patient Outreach (Signed)
[facility]) Care Management  02/29/2016  Dale Morrow 1940/11/30 [id number]  Telephone call to follow up with members progress at SNF post discharge from hospital 02/18/16.  Week #2. No answer and message left for wife.  Plan to call next week.

## 2016-03-01 DIAGNOSIS — R5381 Other malaise: Secondary | ICD-10-CM | POA: Diagnosis not present

## 2016-03-01 DIAGNOSIS — R2681 Unsteadiness on feet: Secondary | ICD-10-CM | POA: Diagnosis not present

## 2016-03-01 DIAGNOSIS — S065X0A Traumatic subdural hemorrhage without loss of consciousness, initial encounter: Secondary | ICD-10-CM | POA: Diagnosis not present

## 2016-03-01 DIAGNOSIS — R531 Weakness: Secondary | ICD-10-CM | POA: Diagnosis not present

## 2016-03-01 DIAGNOSIS — M6281 Muscle weakness (generalized): Secondary | ICD-10-CM | POA: Diagnosis not present

## 2016-03-01 DIAGNOSIS — G8929 Other chronic pain: Secondary | ICD-10-CM | POA: Diagnosis not present

## 2016-03-01 DIAGNOSIS — Z9181 History of falling: Secondary | ICD-10-CM | POA: Diagnosis not present

## 2016-03-01 DIAGNOSIS — R262 Difficulty in walking, not elsewhere classified: Secondary | ICD-10-CM | POA: Diagnosis not present

## 2016-03-01 DIAGNOSIS — R600 Localized edema: Secondary | ICD-10-CM | POA: Diagnosis not present

## 2016-03-04 DIAGNOSIS — R600 Localized edema: Secondary | ICD-10-CM | POA: Diagnosis not present

## 2016-03-04 DIAGNOSIS — Z9181 History of falling: Secondary | ICD-10-CM | POA: Diagnosis not present

## 2016-03-04 DIAGNOSIS — R5381 Other malaise: Secondary | ICD-10-CM | POA: Diagnosis not present

## 2016-03-04 DIAGNOSIS — R2681 Unsteadiness on feet: Secondary | ICD-10-CM | POA: Diagnosis not present

## 2016-03-04 DIAGNOSIS — S065X0A Traumatic subdural hemorrhage without loss of consciousness, initial encounter: Secondary | ICD-10-CM | POA: Diagnosis not present

## 2016-03-04 DIAGNOSIS — G8929 Other chronic pain: Secondary | ICD-10-CM | POA: Diagnosis not present

## 2016-03-04 DIAGNOSIS — R262 Difficulty in walking, not elsewhere classified: Secondary | ICD-10-CM | POA: Diagnosis not present

## 2016-03-04 DIAGNOSIS — M6281 Muscle weakness (generalized): Secondary | ICD-10-CM | POA: Diagnosis not present

## 2016-03-04 DIAGNOSIS — R531 Weakness: Secondary | ICD-10-CM | POA: Diagnosis not present

## 2016-03-05 ENCOUNTER — Other Ambulatory Visit: Payer: Self-pay

## 2016-03-05 DIAGNOSIS — R5381 Other malaise: Secondary | ICD-10-CM | POA: Diagnosis not present

## 2016-03-05 DIAGNOSIS — M6281 Muscle weakness (generalized): Secondary | ICD-10-CM | POA: Diagnosis not present

## 2016-03-05 DIAGNOSIS — R262 Difficulty in walking, not elsewhere classified: Secondary | ICD-10-CM | POA: Diagnosis not present

## 2016-03-05 DIAGNOSIS — Z9181 History of falling: Secondary | ICD-10-CM | POA: Diagnosis not present

## 2016-03-05 DIAGNOSIS — G8929 Other chronic pain: Secondary | ICD-10-CM | POA: Diagnosis not present

## 2016-03-05 DIAGNOSIS — R600 Localized edema: Secondary | ICD-10-CM | POA: Diagnosis not present

## 2016-03-05 DIAGNOSIS — R531 Weakness: Secondary | ICD-10-CM | POA: Diagnosis not present

## 2016-03-05 DIAGNOSIS — R2681 Unsteadiness on feet: Secondary | ICD-10-CM | POA: Diagnosis not present

## 2016-03-05 DIAGNOSIS — S065X0A Traumatic subdural hemorrhage without loss of consciousness, initial encounter: Secondary | ICD-10-CM | POA: Diagnosis not present

## 2016-03-05 NOTE — Patient Outreach (Signed)
[facility]) Care Management  03/05/2016  Dale Morrow 11-06-1940 [id number]  Telephone call from wife to update RNCM on members condition. Wife states that member is progressing with his therapy.  States that he is less confused.  States that he did have difficulty with having a low B/P delayed his therapy for a few days.  States he is eating better and drinking more fluids.  States that he his blood sugars have be stable and she thinks that they will continue with him taking injections of insulin instead of going back on the pump when he returns home as she is not sure he will be able to manage the pump.  States she feels she can help him with his injections as needed.  States she does not know when he will be discharged home.  States she is using her FMLA and then she plans to retire.  States she did speak with [employer] and they are deciding on which Medicare plan to take. Plan to call next week to follow up on members progress.  [facility] CM Care Plan Problem Two        Most Recent Value   Care Plan Problem Two  Recent d/c from hospital for fall with readmission in 2 days   Role Documenting the Problem Two  Care Management [place] for Problem Two  Active   [facility] CM Short Term Goal #1 (0-30 days)  Member will not have a readmission to hospital in the next 30 days   [facility] CM Short Term Goal #1 Start Date  02/22/16   Interventions for Short Term Goal #2   Reinforced wife to make sure she works with the Education officer, museum at the facility to arrange any equipment and home health services that member needs prior to his discharge, Instructed to notify RNCM when member is discharged home, Encouraged wife to call Human Resources to discuss her 60 and retirement, Reinforced wife to call RNCM if she has any questions and she has number to contact [place] RN, [facility] Care Management Coordinator-Link to [place] Management [phone]

## 2016-03-07 ENCOUNTER — Ambulatory Visit: Payer: Self-pay

## 2016-03-07 DIAGNOSIS — Z9181 History of falling: Secondary | ICD-10-CM | POA: Diagnosis not present

## 2016-03-07 DIAGNOSIS — G8929 Other chronic pain: Secondary | ICD-10-CM | POA: Diagnosis not present

## 2016-03-07 DIAGNOSIS — R600 Localized edema: Secondary | ICD-10-CM | POA: Diagnosis not present

## 2016-03-07 DIAGNOSIS — S065X0A Traumatic subdural hemorrhage without loss of consciousness, initial encounter: Secondary | ICD-10-CM | POA: Diagnosis not present

## 2016-03-07 DIAGNOSIS — R531 Weakness: Secondary | ICD-10-CM | POA: Diagnosis not present

## 2016-03-07 DIAGNOSIS — R2681 Unsteadiness on feet: Secondary | ICD-10-CM | POA: Diagnosis not present

## 2016-03-07 DIAGNOSIS — R5381 Other malaise: Secondary | ICD-10-CM | POA: Diagnosis not present

## 2016-03-07 DIAGNOSIS — M6281 Muscle weakness (generalized): Secondary | ICD-10-CM | POA: Diagnosis not present

## 2016-03-07 DIAGNOSIS — R262 Difficulty in walking, not elsewhere classified: Secondary | ICD-10-CM | POA: Diagnosis not present

## 2016-03-12 DIAGNOSIS — M6281 Muscle weakness (generalized): Secondary | ICD-10-CM | POA: Diagnosis not present

## 2016-03-12 DIAGNOSIS — S065X0A Traumatic subdural hemorrhage without loss of consciousness, initial encounter: Secondary | ICD-10-CM | POA: Diagnosis not present

## 2016-03-12 DIAGNOSIS — R600 Localized edema: Secondary | ICD-10-CM | POA: Diagnosis not present

## 2016-03-12 DIAGNOSIS — R531 Weakness: Secondary | ICD-10-CM | POA: Diagnosis not present

## 2016-03-12 DIAGNOSIS — G8929 Other chronic pain: Secondary | ICD-10-CM | POA: Diagnosis not present

## 2016-03-12 DIAGNOSIS — R5381 Other malaise: Secondary | ICD-10-CM | POA: Diagnosis not present

## 2016-03-12 DIAGNOSIS — Z9181 History of falling: Secondary | ICD-10-CM | POA: Diagnosis not present

## 2016-03-12 DIAGNOSIS — R2681 Unsteadiness on feet: Secondary | ICD-10-CM | POA: Diagnosis not present

## 2016-03-12 DIAGNOSIS — R262 Difficulty in walking, not elsewhere classified: Secondary | ICD-10-CM | POA: Diagnosis not present

## 2016-03-13 ENCOUNTER — Other Ambulatory Visit: Payer: Self-pay

## 2016-03-13 ENCOUNTER — Telehealth: Payer: Self-pay | Admitting: Neurology

## 2016-03-13 MED FILL — raNITIdine HCL 150 MG TABS: 150 | 30 days supply | Qty: 60 | Fill #3

## 2016-03-13 NOTE — Telephone Encounter (Signed)
rec'd msg from answering service  Returned call to Jessica/[place]  [phone] sts Dr Guinevere Ferrari is requesting medication eval, pt's BP has been low for awhile. appt sx for tomorrow 03/14/16. She will get in contact with his wife to meed at [facility]

## 2016-03-13 NOTE — Patient Outreach (Signed)
[facility]) Care Management  03/13/2016  Dale Morrow December 12, 1940 [id number]  Telephone call to wife to follow up on members status.  Member continues to be at [facility] for rehab. Wife states that the plan is for member to go home on 03/20/16 with [facility] for nursing, PT, OT and speech therapy.  States they have ordered him a wheel chair to use when needed.  States he has a hospital bed and walker.   States she talked with Human Resources and her FMLA ends June 12th but she will continue to use her PAL until August.  States she plans to retire then and drop the [employer] and sign up for [employer].   Instructed that RNCM will call later next week to see if member is at home and home care services have started. [facility] CM Care Plan Problem Two        Most Recent Value   Care Plan Problem Two  Recent d/c from hospital for fall with readmission in 2 days   Role Documenting the Problem Two  Care Management [place] for Problem Two  Active   [facility] CM Short Term Goal #1 (0-30 days)  Member will not have a readmission to hospital in the next 30 days   [facility] CM Short Term Goal #1 Start Date  02/22/16   Interventions for Short Term Goal #2   Reinforced wife to make sure she works with the Education officer, museum at the facility to arrange any equipment and home health services that member needs prior to his discharge, Instructed to notify RNCM when member is discharged home, Reinforced wife to call RNCM if she has any questions and she has number to contact [place] RN, [facility] Care Management Coordinator-Link to [place] Management [phone]

## 2016-03-14 ENCOUNTER — Encounter: Payer: Self-pay | Admitting: Neurology

## 2016-03-14 ENCOUNTER — Ambulatory Visit (INDEPENDENT_AMBULATORY_CARE_PROVIDER_SITE_OTHER): Payer: 59 | Admitting: Neurology

## 2016-03-14 ENCOUNTER — Ambulatory Visit: Payer: 59

## 2016-03-14 VITALS — BP 105/68 | HR 62

## 2016-03-14 DIAGNOSIS — R569 Unspecified convulsions: Secondary | ICD-10-CM

## 2016-03-14 DIAGNOSIS — R262 Difficulty in walking, not elsewhere classified: Secondary | ICD-10-CM | POA: Diagnosis not present

## 2016-03-14 DIAGNOSIS — W19XXXD Unspecified fall, subsequent encounter: Secondary | ICD-10-CM | POA: Diagnosis not present

## 2016-03-14 DIAGNOSIS — R2681 Unsteadiness on feet: Secondary | ICD-10-CM | POA: Diagnosis not present

## 2016-03-14 DIAGNOSIS — S065XAA Traumatic subdural hemorrhage with loss of consciousness status unknown, initial encounter: Secondary | ICD-10-CM

## 2016-03-14 DIAGNOSIS — R5381 Other malaise: Secondary | ICD-10-CM | POA: Diagnosis not present

## 2016-03-14 DIAGNOSIS — M6281 Muscle weakness (generalized): Secondary | ICD-10-CM | POA: Diagnosis not present

## 2016-03-14 DIAGNOSIS — I6200 Nontraumatic subdural hemorrhage, unspecified: Secondary | ICD-10-CM | POA: Diagnosis not present

## 2016-03-14 DIAGNOSIS — S065X0A Traumatic subdural hemorrhage without loss of consciousness, initial encounter: Secondary | ICD-10-CM | POA: Diagnosis not present

## 2016-03-14 DIAGNOSIS — R202 Paresthesia of skin: Secondary | ICD-10-CM

## 2016-03-14 DIAGNOSIS — R531 Weakness: Secondary | ICD-10-CM | POA: Diagnosis not present

## 2016-03-14 DIAGNOSIS — G8929 Other chronic pain: Secondary | ICD-10-CM | POA: Diagnosis not present

## 2016-03-14 DIAGNOSIS — R209 Unspecified disturbances of skin sensation: Secondary | ICD-10-CM | POA: Diagnosis not present

## 2016-03-14 DIAGNOSIS — S069X0S Unspecified intracranial injury without loss of consciousness, sequela: Secondary | ICD-10-CM

## 2016-03-14 DIAGNOSIS — Y92009 Unspecified place in unspecified non-institutional (private) residence as the place of occurrence of the external cause: Principal | ICD-10-CM

## 2016-03-14 DIAGNOSIS — Z9181 History of falling: Secondary | ICD-10-CM | POA: Diagnosis not present

## 2016-03-14 DIAGNOSIS — R600 Localized edema: Secondary | ICD-10-CM | POA: Diagnosis not present

## 2016-03-14 DIAGNOSIS — S065X9A Traumatic subdural hemorrhage with loss of consciousness of unspecified duration, initial encounter: Secondary | ICD-10-CM

## 2016-03-14 MED ORDER — LAMOTRIGINE 100 MG PO TABS: 100.0000 mg | ORAL_TABLET | Freq: Two times a day (BID) | ORAL | Status: AC

## 2016-03-14 MED ORDER — LAMOTRIGINE 25 MG PO TABS: ORAL_TABLET | ORAL | Status: DC

## 2016-03-14 MED ORDER — LAMOTRIGINE 100 MG PO TABS: 100.0000 mg | ORAL_TABLET | Freq: Two times a day (BID) | ORAL | Status: DC

## 2016-03-14 MED FILL — lamoTRIgine 25 MG TABS: 25 | 23 days supply | Qty: 100 | Fill #0

## 2016-03-14 NOTE — Progress Notes (Signed)
Chief Complaint  Patient presents with  . Fall    He is here with his wife, Dale Morrow.  Reports having a fall on 02/12/16 that required emergency treatment (due to a head injury).  He is now at [facility] for rehabilitation.       Dale Morrow is a 75 years old referred by primary care physician for evaluation of tremor and left hemifacial spasm.  He has family history of tremor, mother at her old age,  his sister, maternal uncle all had bilateral hand tremor, he also has past medical history of hypertension, diabetes, insulin pump, hyperlipidemia, prostate cancer, status post radiation therapy history of left knee replacement  He  has a history of bilateral Bell's palsy, with residual left upper and lower face weakness  He noticed bilateral hand shaking since 2004,  difficulty writing, holding utensils, around 2009, he also noticed voice change, there was a tremorous quality to it  He denies loss sense of smell, and there was no REM sleep disorder, no gait difficulty. TSH was normal.   He was placed on propranolol in 2013, with moderate response, with bp at low normal side 100/66, primidone 50mg  qhs was added on since Nov 2013, He reported mild  improvement with primidone 50 mg 2 tablets twice a day.   I saw him regularly in the past for EMG guided xeomin injection since November 2014, he responded very well, last injection was in May of 2015. He had bilateral blephoplasty by [place] ophthalmologist in February 2016,  which has helped his left facial spasm, he also had left knee redo in April 2016, still complains of moderate constant left knee pain, on Dilaudid, oxycodone treatment, is going for rehabilitation, sometimes his left knee is so painful, make him nauseous,  He came back to clinic today complains of worsening bilateral hands tremor, difficulty holding a cup of coffee, holding utensils, cause inconvenient, social embarrassment, also noticed head titubation, increase voice tremor,  needs to repeat himself sometimes.  He is now taking propranolol 60 mg twice a day, primidone 50 mg 2 tablets 3 times a day, propranolol works better for him, blood pressure was in the range of 130-140.  UPDATE Jul 24 2015: He was admitted to the hospital in July 2016 for prolonged seizure due to hypoglycemia, blood glucose was dropped to 30s, EEG that time showed mild background slowing. He is now on insulin pump, also had left carpal tunnel, ulnar decompression surgery, which did help his complains of left hand paresthesia,  He has mild unsteady gait, continue complains of mild bilateral hands tremor, he is taking Inderal 80 mg twice a day, primidone 50 mg 3 times a day  UPDATE Oct 30 2015: He recently had MRI cervical spine by Dr. Burney Gauze, he still has bilateral hand paresthesia, no gait difficulty, no incontinence.  His diabetes is under good control, use insulin pump. He still has intermittent hand tremor, taking inderal LA 60mg  daily.  Update June first 2017: He was doing very well until May first 2017, he was found down at home, with severe left eye swelling, bruise, confused, he was taken to the emergency room, upon arrival, glucose level more than 600, he has no recollection of the event,  We have personally reviewed CAT scan: acute traumatic subarachnoid hemorrhage involving both cerebral hemispheres as above, with additional 13 mm parenchymal contusion at the left temporal pole. Small volume acute subdural hemorrhage overlying the right frontal convexity and falx.  Small volume acute intraventricular hemorrhage  within the occipital horns of both lateral ventricles.  Large left frontal/periorbital scalp contusion. No calvarial fracture.  EEG showed diffuse slowing, he is now at the rehabilitation,  Reviewed laboratory, mild anemia hemoglobin 10 point 9, normal BMP, negative troponin  REVIEW OF SYSTEMS: Full 14 system review of systems performed and notable only for unsteady gait,  left facial weakness, memory loss, dizziness  ALLERGIES: Allergies  Allergen Reactions  . Bee Venom Anaphylaxis  . Shellfish Allergy Anaphylaxis  . Augmentin [Amoxicillin-Pot Clavulanate] Rash    HOME MEDICATIONS: Current Outpatient Prescriptions  Medication Sig Dispense Refill  . acetaminophen (TYLENOL) 325 MG tablet Take 650 mg by mouth. Take 2 tablets every 8 hours    . CALCIUM-VITAMIN D PO Take 1 tablet by mouth daily at 12 noon.    . docusate sodium (COLACE) 100 MG capsule Take 100 mg by mouth. Take one capsule twice daily    . dorzolamide-timolol (COSOPT) 22.3-6.8 MG/ML ophthalmic solution Place 1 drop into the left eye 2 (two) times daily. 10 mL 0  . escitalopram (LEXAPRO) 20 MG tablet Take 20 mg by mouth daily.    . insulin aspart (NOVOLOG) 100 UNIT/ML injection 0-15 Units, Subcutaneous, 3 times daily with meals,  CBG < 70: implement hypoglycemia protocol CBG 70 - 120: 0 units CBG 121 - 150: 2 units CBG 151 - 200: 3 units CBG 201 - 250: 5 units CBG 251 - 300: 8 units CBG 301 - 350: 11 units CBG 351 - 400: 15 units CBG > 400: call MD  11  . insulin aspart (NOVOLOG) 100 UNIT/ML injection Inject 4 Units into the skin 3 (three) times daily with meals.    . Insulin Glargine (LANTUS) 100 UNIT/ML Solostar Pen Inject 21 Units into the skin daily.    . Melatonin 5 MG TABS Take by mouth at bedtime.    . Multiple Vitamins-Minerals (ONE-A-DAY MENS 50+ ADVANTAGE PO) Take 1 tablet by mouth daily at 12 noon.    . polyethylene glycol (MIRALAX / GLYCOLAX) packet Take 17 g by mouth at bedtime.    . pregabalin (LYRICA) 100 MG capsule Take 1 capsule (100 mg total) by mouth 3 (three) times daily. 90 capsule 5  . primidone (MYSOLINE) 50 MG tablet TAKE 2 TABLETS BY MOUTH 3 TIMES DAILY. 180 tablet 11  . propranolol (INDERAL) 10 MG tablet Take 10 mg by mouth 2 (two) times daily.    . Protein (PROCEL) POWD Take 1 scoop by mouth 2 (two) times daily.     No current facility-administered medications  for this visit.    PAST MEDICAL HISTORY: Past Medical History  Diagnosis Date  . Arthritis     left knee  . Depression   . De Quervain's tenosynovitis, left 04/2012  . Tremors of nervous system     takes propranolol for tremors  . Prostate cancer, primary, with metastasis from prostate to other site [facility])     a. metastasis to lymph nodes;  b. s/p XRT.  [name] Complication of anesthesia     pt denies ponv, pt woke up during colonscopy in past  . Diabetes mellitus     Insulin pump, takes lisinopril for kidneys, dr Buddy Duty endocrinologist  . Left-sided Bell's palsy   . Right-sided Bell's palsy   . Nonischemic cardiomyopathy ([place])     a. 04/2015 Echo: EF 25-30%, mid apical, antseptal, anterolat, inflat, infsept, apical AK. ? takotsubo. No thrombus. PASP 83mmHg;  b. 04/2014 Lexi MV: EF 29%, fixed mod size and intensity mid-dist  ant, apical, inferior scar w/ wma's;  c. 04/2015 Cath: LM nl, LAD 50p/m, LCX 25p, RI nl, RCA nl, RPAV 50d-->Med Rx.  . Chronic systolic CHF (congestive heart failure) ([place])     a. 04/2015 Echo: EF 25-30%.  [name] GERD (gastroesophageal reflux disease)   . Fever   . SDH (subdural hematoma) ([place])   . TBI (traumatic brain injury) ([place])     With [facility] and SDH  . Normocytic anemia   . Intracranial hemorrhage ([place])   . Aspiration pneumonia ([place])   . Status epilepticus ([place])     Due to hypoglycemia  . Essential tremor   . PONV (postoperative nausea and vomiting)   . Laceration of left eye     Traumatic, with significant swelling  . Seizure-like activity ([place])   . Takotsubo cardiomyopathy     H/O, EF 55%    PAST SURGICAL HISTORY: Past Surgical History  Procedure Laterality Date  . Hemorrhoid surgery  1970s  . Anterior cruciate ligament repair      right knee  . Wrist arthroscopy  05/03/2010    right; and release of 1st dorsal compartment  . Knee arthrotomy  03/18/2008    left; with scar exc.  [name] Knee arthroscopy  09/25/2007; 04/15/2005    left  . Knee joint manipulation   06/29/2007    left  . Total knee arthroplasty  04/29/2007    left  . Dorsal compartment release  04/21/2012    Procedure: RELEASE DORSAL COMPARTMENT (DEQUERVAIN);  Surgeon: Cammie Sickle., MD;  Location[facility];  Service: Orthopedics;  Laterality: Left;  left 1st dorsal compartment release left wrist   . Minor amputation of digit Left 07/21/2013    Procedure: LEFT SMALL REVISION AMPUTATION (MINOR PROCEDURE) ;  Surgeon: Schuyler Amor, MD;  Location: [place];  Service: Orthopedics;  Laterality: Left;  . Colonoscopy with propofol N/A 04/07/2014    Procedure: COLONOSCOPY WITH PROPOFOL;  Surgeon: Jeryl Columbia, MD;  Location: [facility];  Service: Endoscopy;  Laterality: N/A;  . Total knee revision Left 01/25/2015    Procedure: LEFT TOTAL KNEE  ARTHOPLASTY REVISION;  Surgeon: Gaynelle Arabian, MD;  Location: [facility];  Service: Orthopedics;  Laterality: Left;  . Cardiac catheterization N/A 04/26/2015    Procedure: Left Heart Cath and Coronary Angiography;  Surgeon: Jettie Booze, MD;  Location: [place] [facility];  Service: Cardiovascular;  Laterality: N/A;  . Cardiac catheterization  04/26/2015    Procedure: Intravascular Ultrasound/IVUS;  Surgeon: Jettie Booze, MD;  Location: [place] [facility];  Service: Cardiovascular;;  . Carpal tunnel release Left 07/05/2015    Procedure: LEFT CARPAL TUNNEL RELEASE;  Surgeon: Charlotte Crumb, MD;  Location: [place];  Service: Orthopedics;  Laterality: Left;  . Ulnar tunnel release Left 07/05/2015    Procedure: LEFT CUBITAL TUNNEL RELEASE;  Surgeon: Charlotte Crumb, MD;  Location: [place];  Service: Orthopedics;  Laterality: Left;    FAMILY HISTORY: Family History  Problem Relation Age of Onset  . Heart Problems Father   . Heart attack Father   . Hypertension Mother   . Stroke Mother     SOCIAL HISTORY:  Social History   Social History  . Marital Status: Married     Spouse Name: Joseangel Grissett  . Number of Children: o  . Years of Education: college   Occupational History  . retired Orthoptist    Social History Main Topics  . Smoking  status: Former Smoker -- 15 years    Types: Cigarettes    Quit date: 10/13/1979  . Smokeless tobacco: Never Used  . Alcohol Use: No  . Drug Use: No  . Sexual Activity: Not on file   Other Topics Concern  . Not on file   Social History Narrative   Patient lives at home with his wife Dale Morrow) and there four dogs.Admitted to [facility] 02/20/16   Retired-Sales Rrepresentative   Education two years of college.   Right handed.   Caffeine one cup of coffee daily.   Former smoker-stopped 1980   Alcohol none   FULL CODE     PHYSICAL EXAM   Filed Vitals:   03/14/16 1652  BP: 105/68  Pulse: 62    Not recorded      There is no weight on file to calculate BMI.  PHYSICAL EXAMNIATION:  Gen: NAD, conversant, well nourised, obese, well groomed                     Cardiovascular: Regular rate rhythm, no peripheral edema, warm, nontender. Eyes: Conjunctivae clear without exudates or hemorrhage Neck: Supple, no carotid bruise. Pulmonary: Clear to auscultation bilaterally   NEUROLOGICAL EXAM:  MENTAL STATUS: Speech:    Speech is normal; fluent and spontaneous with normal comprehension.  Cognition:Mini-Mental Status Examination 20/30    Orientation: He is not oriented to date, year, date,    recent and remote memory intact;     language fluent; he has difficulty copy design or write a full sentence    attention, concentration, he has difficulty spelling word backwards     fund of knowledge  CRANIAL NERVES: CN II: Visual fields are full to confrontation. Pupils are 4 mm and briskly reactive to light. Visual acuity is 20/20 bilaterally. CN III, IV, VI: extraocular movement are normal. No ptosis. CN V: Facial sensation is intact to pinprick in all 3 divisions bilaterally. Corneal responses are  intact.  CN VII: Face is asymmetry, he has mild left eye-closure, cheek puff weakness. CN VIII: Hearing is normal to rubbing fingers CN IX, X: Palate elevates symmetrically. Phonation is normal. CN XI: Head turning and shoulder shrug are intact CN XII: Tongue is midline with normal movements and no atrophy.  MOTOR:  He has bilateral hands postural tremor,. Muscle bulk and tone are normal. He has mild bilateral ankle dorsiflexion weakness    REFLEXES: Reflexes are 2+ and symmetric at the biceps, triceps, knees, and absent at  ankles. Plantar responses are flexor.  SENSORY:  length dependent decreased to Light touch, pinprick and vibratory sensation to distal shin level  Corrdination: Rapid alternating movements and fine finger movements are intact. There is no dysmetria on finger-to-nose and heel-knee-shin.   GAIT/STANCE: He needs push up to get up from seated position, mildly unsteady  DIAGNOSTIC DATA (LABS, IMAGING, TESTING) - I reviewed patient records, labs, notes, testing and imaging myself where available.  ASSESSMENT AND PLAN  Dale Morrow is a 75 y.o. male    Essential tremor,  Keep primidone 50 mg ii 3 times a day  Tolerating inderal LA 60mg  daily  Left hemifacial spasm,   overall much improved after his bilateral blepharoplasty Status epilepticus in July 2016 due to hypoglycemia, recurrent sudden onset confusion and fall, with intracranial bleeding in Feb 12 2016 with associated glucose level >600  Highly suspicious for recurrent seizure  Repeat EEG  After discussed with patient, we will proceed with titrating dose of Lamictal 100  mg twice a day   Cervical degenerative changes, bilateral hands paresthesia  He was not sure that Lyrica has helped him, will taper off Lyrica 100 mg 3 times a day Insulin dependent diabetes   Marcial Pacas, M.D. Ph.D.  [facility]` [address], [place],  [address] Ph: [phone] Fax:  [phone]

## 2016-03-18 DIAGNOSIS — R2681 Unsteadiness on feet: Secondary | ICD-10-CM | POA: Diagnosis not present

## 2016-03-18 DIAGNOSIS — G8929 Other chronic pain: Secondary | ICD-10-CM | POA: Diagnosis not present

## 2016-03-18 DIAGNOSIS — M6281 Muscle weakness (generalized): Secondary | ICD-10-CM | POA: Diagnosis not present

## 2016-03-18 DIAGNOSIS — Z9181 History of falling: Secondary | ICD-10-CM | POA: Diagnosis not present

## 2016-03-18 DIAGNOSIS — R600 Localized edema: Secondary | ICD-10-CM | POA: Diagnosis not present

## 2016-03-18 DIAGNOSIS — R262 Difficulty in walking, not elsewhere classified: Secondary | ICD-10-CM | POA: Diagnosis not present

## 2016-03-18 DIAGNOSIS — R531 Weakness: Secondary | ICD-10-CM | POA: Diagnosis not present

## 2016-03-18 DIAGNOSIS — R5381 Other malaise: Secondary | ICD-10-CM | POA: Diagnosis not present

## 2016-03-18 DIAGNOSIS — S065X0A Traumatic subdural hemorrhage without loss of consciousness, initial encounter: Secondary | ICD-10-CM | POA: Diagnosis not present

## 2016-03-19 ENCOUNTER — Non-Acute Institutional Stay (SKILLED_NURSING_FACILITY): Payer: 59 | Admitting: Adult Health

## 2016-03-19 ENCOUNTER — Encounter: Payer: Self-pay | Admitting: Adult Health

## 2016-03-19 DIAGNOSIS — G629 Polyneuropathy, unspecified: Secondary | ICD-10-CM | POA: Diagnosis not present

## 2016-03-19 DIAGNOSIS — E46 Unspecified protein-calorie malnutrition: Secondary | ICD-10-CM

## 2016-03-19 DIAGNOSIS — S0592XS Unspecified injury of left eye and orbit, sequela: Secondary | ICD-10-CM

## 2016-03-19 DIAGNOSIS — E118 Type 2 diabetes mellitus with unspecified complications: Secondary | ICD-10-CM | POA: Diagnosis not present

## 2016-03-19 DIAGNOSIS — K5901 Slow transit constipation: Secondary | ICD-10-CM

## 2016-03-19 DIAGNOSIS — R531 Weakness: Secondary | ICD-10-CM

## 2016-03-19 DIAGNOSIS — D649 Anemia, unspecified: Secondary | ICD-10-CM

## 2016-03-19 DIAGNOSIS — R569 Unspecified convulsions: Secondary | ICD-10-CM

## 2016-03-19 DIAGNOSIS — S069X0S Unspecified intracranial injury without loss of consciousness, sequela: Secondary | ICD-10-CM

## 2016-03-19 DIAGNOSIS — Z794 Long term (current) use of insulin: Secondary | ICD-10-CM

## 2016-03-19 DIAGNOSIS — F32A Depression, unspecified: Secondary | ICD-10-CM

## 2016-03-19 DIAGNOSIS — I428 Other cardiomyopathies: Secondary | ICD-10-CM

## 2016-03-19 DIAGNOSIS — I429 Cardiomyopathy, unspecified: Secondary | ICD-10-CM | POA: Diagnosis not present

## 2016-03-19 DIAGNOSIS — G250 Essential tremor: Secondary | ICD-10-CM

## 2016-03-19 DIAGNOSIS — I10 Essential (primary) hypertension: Secondary | ICD-10-CM

## 2016-03-19 DIAGNOSIS — F329 Major depressive disorder, single episode, unspecified: Secondary | ICD-10-CM | POA: Diagnosis not present

## 2016-03-19 NOTE — Progress Notes (Signed)
Patient ID: Dale Morrow, male   DOB: 09-16-1941, 75 y.o.   MRN: [id number]    DATE:  03/19/2016   MRN:  [id number]  BIRTHDAY: 09/11/1941  Facility:  Nursing Home Location:  [place] Room Number: [id number]  LEVEL OF CARE:  SNF (31)  Contact Information    Name Relation Home Work [place] [address] [place] [phone]  [phone]       Code Status History    Date Active Date Inactive Code Status Order ID Comments User Context   02/18/2016 10:10 PM 02/20/2016 10:28 PM Full Code [id number]  Reubin Milan, MD ED   02/13/2016  4:38 PM 02/16/2016 10:05 PM Full Code [id number]  Lisette Abu, PA-C Inpatient   04/26/2015 11:03 AM 04/26/2015  6:01 PM Full Code [id number]  Jettie Booze, MD Inpatient   04/19/2015  2:43 AM 04/26/2015 11:03 AM Full Code [id number]  Corey Harold, NP ED   01/25/2015 12:10 PM 01/27/2015  2:28 PM Full Code [id number]  Gaynelle Arabian, MD Inpatient       Chief Complaint  Patient presents with  . Discharge Note    HISTORY OF PRESENT ILLNESS:  This is a 75 year old male who is for discharge home with Home health PT for endurance, OT for ADLs, ST for cognition, CNA for showers and skilled Nurse for disease management. DME:  Standard wheelchair 80' X 18" with cushion, anti-tippers, elevated leg rests, removable arm rests and brake extenders.  He has been admitted to [facility] on 02/20/16 from [facility]. He has PMH of Osteoarthritis, depression, essential tremor, CVA, diabetes mellitus, nonischemic cardiomyopathy, chronic systolic CHF and GERD. He was recently discharged  (02/13/16) home from the hospital secondary to subdural hematoma. Wife brought him back to the hospital due to worsening condition. He was noted to have occasional dyspnea. Imaging showed mildly increased versus redistribution of subdural hematoma.  Patient was admitted to this facility for short-term rehabilitation after the patient's recent  hospitalization.  Patient has completed SNF rehabilitation and therapy has cleared the patient for discharge.  PAST MEDICAL HISTORY:  Past Medical History  Diagnosis Date  . Arthritis     left knee  . Depression   . De Quervain's tenosynovitis, left 04/2012  . Tremors of nervous system     takes propranolol for tremors  . Prostate cancer, primary, with metastasis from prostate to other site [facility])     a. metastasis to lymph nodes;  b. s/p XRT.  [name] Complication of anesthesia     pt denies ponv, pt woke up during colonscopy in past  . Diabetes mellitus     Insulin pump, takes lisinopril for kidneys, dr Buddy Duty endocrinologist  . Left-sided Bell's palsy   . Right-sided Bell's palsy   . Nonischemic cardiomyopathy ([place])     a. 04/2015 Echo: EF 25-30%, mid apical, antseptal, anterolat, inflat, infsept, apical AK. ? takotsubo. No thrombus. PASP 71mmHg;  b. 04/2014 Lexi MV: EF 29%, fixed mod size and intensity mid-dist ant, apical, inferior scar w/ wma's;  c. 04/2015 Cath: LM nl, LAD 50p/m, LCX 25p, RI nl, RCA nl, RPAV 50d-->Med Rx.  . Chronic systolic CHF (congestive heart failure) ([place])     a. 04/2015 Echo: EF 25-30%.  [name] GERD (gastroesophageal reflux disease)   . Fever   . SDH (subdural hematoma) ([place])   . TBI (traumatic brain injury) ([place])     With [facility] and SDH  . Normocytic anemia   .  Intracranial hemorrhage ([place])   . Aspiration pneumonia ([place])   . Status epilepticus ([place])     Due to hypoglycemia  . Essential tremor   . PONV (postoperative nausea and vomiting)   . Laceration of left eye     Traumatic, with significant swelling  . Seizure-like activity ([place])   . Takotsubo cardiomyopathy     H/O, EF 55%     CURRENT MEDICATIONS: Reviewed  Patient's Medications  New Prescriptions   No medications on file  Previous Medications   ACETAMINOPHEN (TYLENOL) 325 MG TABLET    Take 650 mg by mouth. Take 2 tablets every 8 hours - at 0600, 1400, and 2200   CALCIUM-VITAMIN D PO    Take 1 tablet by  mouth daily at 12 noon.   DOCUSATE SODIUM (COLACE) 100 MG CAPSULE    Take 100 mg by mouth. Take one capsule twice daily   DORZOLAMIDE-TIMOLOL (COSOPT) 22.3-6.8 MG/ML OPHTHALMIC SOLUTION    Place 1 drop into the left eye 2 (two) times daily.   ESCITALOPRAM (LEXAPRO) 20 MG TABLET    Take 20 mg by mouth daily.   INSULIN ASPART (NOVOLOG) 100 UNIT/ML INJECTION    0-15 Units, Subcutaneous, 3 times daily with meals,  CBG < 70: implement hypoglycemia protocol CBG 70 - 120: 0 units CBG 121 - 150: 2 units CBG 151 - 200: 3 units CBG 201 - 250: 5 units CBG 251 - 300: 8 units CBG 301 - 350: 11 units CBG 351 - 400: 15 units CBG > 400: call MD   INSULIN ASPART (NOVOLOG) 100 UNIT/ML INJECTION    Inject 4 Units into the skin 3 (three) times daily with meals.   INSULIN GLARGINE (LANTUS) 100 UNIT/ML SOLOSTAR PEN    Inject 21 Units into the skin daily.   LAMOTRIGINE (LAMICTAL) 100 MG TABLET    Take 1 tablet (100 mg total) by mouth 2 (two) times daily.   LAMOTRIGINE (LAMICTAL) 100 MG TABLET    Take 100 mg by mouth 2 (two) times daily.   LAMOTRIGINE (LAMICTAL) 25 MG TABLET    One po bid xone week, then 2 tabs po bid xone week, then 3 tabs po bid xone week, then 4 tabs po bid   MELATONIN 5 MG TABS    Take by mouth at bedtime.   MULTIPLE VITAMINS-MINERALS (ONE-A-DAY MENS 50+ ADVANTAGE PO)    Take 1 tablet by mouth daily at 12 noon.   POLYETHYLENE GLYCOL (MIRALAX / GLYCOLAX) PACKET    Take 17 g by mouth at bedtime.   PREGABALIN (LYRICA) 100 MG CAPSULE    Take 100 mg by mouth 3 (three) times daily. Give at 6AM, 2PM, 10PM   PRIMIDONE (MYSOLINE) 50 MG TABLET    TAKE 2 TABLETS BY MOUTH 3 TIMES DAILY.   PROPRANOLOL (INDERAL) 10 MG TABLET    Take 10 mg by mouth 2 (two) times daily.   PROTEIN (PROCEL) POWD    Take 1 scoop by mouth 2 (two) times daily.  Modified Medications   No medications on file  Discontinued Medications   No medications on file     Allergies  Allergen Reactions  . Bee Venom Anaphylaxis  .  Shellfish Allergy Anaphylaxis  . Augmentin [Amoxicillin-Pot Clavulanate] Rash     REVIEW OF SYSTEMS:  GENERAL: no change in appetite, no fatigue, no weight changes, no fever, chills or weakness EYES: Denies change in vision, dry eyes, eye pain, itching or discharge EARS: Denies change in hearing, ringing in ears, or  earache NOSE: Denies nasal congestion or epistaxis MOUTH and THROAT: Denies oral discomfort, gingival pain or bleeding, pain from teeth or hoarseness   RESPIRATORY: no cough, SOB, DOE, wheezing, hemoptysis CARDIAC: no chest pain, edema or palpitations GI: no abdominal pain, diarrhea, constipation, heart burn, nausea or vomiting GU: Denies dysuria, frequency, hematuria, incontinence, or discharge PSYCHIATRIC: Denies feeling of depression or anxiety. No report of hallucinations, insomnia, paranoia, or agitation   PHYSICAL EXAMINATION  GENERAL APPEARANCE: Well nourished. In no acute distress. Normal body habitus SKIN:  Has ecchymosis under bilateral eyes and left face HEAD: Normal in size and contour. No evidence of trauma EYES: Lids open and close normally. No blepharitis, entropion or ectropion. PERRL. Conjunctivae are clear and sclerae are white. Lenses are without opacity EARS: Pinnae are normal. Patient hears normal voice tunes of the examiner MOUTH and THROAT: Lips are without lesions. Oral mucosa is moist and without lesions. Tongue is normal in shape, size, and color and without lesions NECK: supple, trachea midline, no neck masses, no thyroid tenderness, no thyromegaly LYMPHATICS: no LAN in the neck, no supraclavicular LAN RESPIRATORY: breathing is even & unlabored, BS CTAB CARDIAC: RRR, no murmur,no extra heart sounds, no edema GI: abdomen soft, normal BS, no masses, no tenderness, no hepatomegaly, no splenomegaly EXTREMITIES:  Able to move X 4 extremities PSYCHIATRIC: Alert and oriented X 3. Affect and behavior are appropriate  LABS/RADIOLOGY: Labs  reviewed: Basic Metabolic Panel:  Recent Labs  04/19/15 0430  04/22/15 1150  02/18/16 1524 02/19/16 0550 02/20/16 0638 02/22/16  NA 137  < >  --   < > 133* 131* 134* 135*  K 5.1  < >  --   < > 3.9 4.1 3.7 4.5  CL 104  < >  --   < > 95* 97* 96*  --   CO2 21*  < >  --   < > 26 25 25   --   GLUCOSE 183*  < >  --   < > 193* 269* 161*  --   BUN 20  < >  --   < > 13 12 13 14   CREATININE 0.85  < >  --   < > 0.83 0.75 0.80 0.8  CALCIUM 8.3*  < >  --   < > 8.7* 8.1* 8.1*  --   MG 2.2  --  1.9  --   --   --   --   --   PHOS 2.4*  --   --   --   --   --   --   --   < > = values in this interval not displayed. Liver Function Tests:  Recent Labs  02/18/16 1524 02/19/16 0550 02/20/16 0638  AST 29 26 29   ALT 17 16* 19  ALKPHOS 65 62 65  BILITOT 0.6 0.8 0.7  PROT 6.6 6.1* 5.9*  ALBUMIN 3.1* 2.9* 2.9*    Recent Labs  04/20/15 1018 04/22/15 1150  AMMONIA 94* 33   CBC:  Recent Labs  02/18/16 1524 02/19/16 0550 02/20/16 0638 02/22/16  WBC 7.9 6.8 7.1 5.7  NEUTROABS 5.9 5.1 5.2  --   HGB 10.8* 10.3* 10.5* 10.8*  HCT 32.4* 30.3* 30.6* 33*  MCV 91.0 92.1 91.9  --   PLT 255 244 246 296   Lipid Panel:  Recent Labs  04/26/15 1145  HDL 33*   Cardiac Enzymes:  Recent Labs  04/19/15 0130 04/20/15 0209 04/20/15 1306 02/18/16 1524  CKTOTAL 490* 378  --   --  TROPONINI  --   --  1.01* 0.03   CBG:  Recent Labs  02/20/16 0553 02/20/16 1200 02/20/16 1652  GLUCAP 144* 303* 143*      Ct Head Wo Contrast  02/18/2016  CLINICAL DATA:  Altered mental status. Recent fall and subdural hematoma. Subsequent encounter EXAM: CT HEAD WITHOUT CONTRAST TECHNIQUE: Contiguous axial images were obtained from the base of the skull through the vertex without intravenous contrast. COMPARISON:  02/14/2016 FINDINGS: Skull and Sinuses:Swelling around the left face and frontal scalp is diminished. Negative for fracture or hemo sinus. Visualized orbits: Bilateral cataract resection.  No  acute finding. Brain: Very similar volume and extent of intracranial hemorrhage as seen previously. There is scattered traumatic pattern subarachnoid blood greatest in the anterior hemispheric sulci and low left sylvian fissure. Small volume intraventricular hemorrhage layering in the occipital horns of the lateral ventricles. No hydrocephalus or change in ventricular volume. There are bilateral predominantly low-density subdural collections with mild cortical mass effect. The subdural clot in the right frontal region is mildly increased from prior, interval hemorrhage versus redistribution, measuring 4 mm in thickness today. No parenchymal hemorrhage or swelling. No acute infarct. IMPRESSION: Compared to 02/14/2016, re- distributed or mildly increased right frontal subdural hematoma without increased mass effect. Elsewhere stable intracranial hemorrhage that is subdural, subarachnoid, and intraventricular. Electronically Signed   By: Monte Fantasia M.D.   On: 02/18/2016 20:38    ASSESSMENT/PLAN:  Generalized weakness - for Home health PT, ST, CNA, Skilled Nurse and OT  TBI notable areas of SAH, SDH plus bilateral IVH - follow-up with neurosurgery, Dr. Annette Stable  Left eye injury with laceration S/P suture - follow-up with Dr. Katy Fitch, ophthalmology  History of Takosubo's cardiomyopathy - follow-up with cardiology  Essential tremor - recently decreased Propanolol 10 mg and Mysoline 50 mg 2 tabs = 100 mg by mouth twice a day  Diabetes mellitus, type II - continue NovoLog 4 units 3 times a day with meals, NovoLog sliding scale 3 times a day and Lantus 21 units subcutaneous daily Lab Results  Component Value Date   HGBA1C 8.4* 02/13/2016    Depression - continue Lexapro 20 mg 1 tab by mouth daily  Constipation - continue MiraLAX 17 g by mouth daily at bedtime and start Colace 100 mg 1 capsule BID  Neuropathy - decrease Lyrica to 100 mg BID X 1 week then Q D X 1 week then discontinue  Hypertension -  well-controlled; Lisinopril was recently discontinued due to orthostatic hypotension  Protein-calorie malnutrition - albumin 3.1; continue Procel 1 scoop BID  Normocytic anemia - hemoglobin 10.5; re-checked hgb 10.8, stable  Insomnia - continue Melatonin 5 mg 1 tab PO Q HS  Seizure - neurology consult report - highly suspicious for recurrent seizure; continue titrating dose of Lamictal to 100 mg BID      I have filled out patient's discharge paperwork and written prescriptions.  Patient will receive home health PT, OT, ST, Skilled Nursing and CNA.  DME provided:  Standard wheelchair 18' X 18" with cushion, anti-tippers, elevated leg rests, removable arm rests and brake extenders  Total discharge time: Greater than 30 minutes  Discharge time involved coordination of the discharge process with social worker, nursing staff and therapy department. Medical justification for home health services/DME verified.     Durenda Age, NP [employer] [phone]

## 2016-03-20 MED FILL — LANTUS 100 UNITS/ML VIAL: 100 | 28 days supply | Qty: 10 | Fill #0

## 2016-03-20 MED FILL — lamoTRIgine 100 MG TABS: 100 | 30 days supply | Qty: 60 | Fill #0

## 2016-03-20 MED FILL — ROSUVASTATIN CALCIUM 10 MG: 10 | 30 days supply | Qty: 30 | Fill #0

## 2016-03-20 MED FILL — PROPRANOLOL 10 MG TABLET: 10 | 30 days supply | Qty: 60 | Fill #0

## 2016-03-20 MED FILL — PRIMIDONE 50 MG TABLET: 50 | 30 days supply | Qty: 180 | Fill #0

## 2016-03-20 MED FILL — NovoLOG 100 UNIT/ML SOLN: 100 | 22 days supply | Qty: 10 | Fill #0

## 2016-03-20 MED FILL — LYRICA 100 MG CAPSULE: 100 | 14 days supply | Qty: 21 | Fill #0

## 2016-03-21 ENCOUNTER — Other Ambulatory Visit: Payer: Self-pay

## 2016-03-21 DIAGNOSIS — I5181 Takotsubo syndrome: Secondary | ICD-10-CM | POA: Diagnosis not present

## 2016-03-21 DIAGNOSIS — G510 Bell's palsy: Secondary | ICD-10-CM | POA: Diagnosis not present

## 2016-03-21 DIAGNOSIS — S065X9D Traumatic subdural hemorrhage with loss of consciousness of unspecified duration, subsequent encounter: Secondary | ICD-10-CM | POA: Diagnosis not present

## 2016-03-21 DIAGNOSIS — G250 Essential tremor: Secondary | ICD-10-CM | POA: Diagnosis not present

## 2016-03-21 DIAGNOSIS — R4189 Other symptoms and signs involving cognitive functions and awareness: Secondary | ICD-10-CM | POA: Diagnosis not present

## 2016-03-21 DIAGNOSIS — S0532XD Ocular laceration without prolapse or loss of intraocular tissue, left eye, subsequent encounter: Secondary | ICD-10-CM | POA: Diagnosis not present

## 2016-03-21 DIAGNOSIS — S066X9D Traumatic subarachnoid hemorrhage with loss of consciousness of unspecified duration, subsequent encounter: Secondary | ICD-10-CM | POA: Diagnosis not present

## 2016-03-21 DIAGNOSIS — I5022 Chronic systolic (congestive) heart failure: Secondary | ICD-10-CM | POA: Diagnosis not present

## 2016-03-21 DIAGNOSIS — E1165 Type 2 diabetes mellitus with hyperglycemia: Secondary | ICD-10-CM | POA: Diagnosis not present

## 2016-03-21 NOTE — Patient Outreach (Signed)
[place] [facility]) Care Management  03/21/2016  HAFIZ SOLOMONS 1941-02-26 [id number]   Member discussed at [facility] care coordination call.  Member is being followed by Milana Kidney RNCM.  Plan to call member tomorrow to follow up on discharge from [facility] 03/20/16. Peter Garter RN, [facility] Care Management Coordinator-Link to [place] Management [phone]

## 2016-03-22 ENCOUNTER — Other Ambulatory Visit: Payer: Self-pay

## 2016-03-22 DIAGNOSIS — S0512XA Contusion of eyeball and orbital tissues, left eye, initial encounter: Secondary | ICD-10-CM | POA: Diagnosis not present

## 2016-03-22 DIAGNOSIS — Z961 Presence of intraocular lens: Secondary | ICD-10-CM | POA: Diagnosis not present

## 2016-03-22 NOTE — Patient Outreach (Signed)
[facility] [facility]) Care Management  03/22/2016  Dale Morrow Sep 02, 1941 [id number]  Telephone call to follow up with member.  Member was discharged from [facility] 03/20/16. Wife states that member has been doing good since getting home.  States he has been oriented and is getting stronger.  States that [facility] came out yesterday and he is to start PT, OT and speech therapy.  States that he went to see Dr. Katy Fitch for an eye exam today and he did not have any damage to his eye from his fall.  States that member is now taking a nap after going to the doctor.  States that she is assisting him with his insulin and checking his blood sugars.  States his CBG was 300 this AM and was down to 120 before lunch.  States they have not made an appt with Dr. Justin Mend or Dr. Buddy Duty yet but she plans to call to make those follow up appts.  States that member is using walker with minimal assistance and she does not need to assist him with toileting. Plan to call later next week to follow up with member. [facility] CM Care Plan Problem Two        Most Recent Value   Care Plan Problem Two  Recent d/c from hospital for fall with readmission in 2 days   Role Documenting the Problem Two  Care Management [place] for Problem Two  Active   THN CM Short Term Goal #1 (0-30 days)  Member will not have a readmission to hospital in the next 30 days   [facility]N CM Short Term Goal #1 Start Date  02/22/16   Interventions for Short Term Goal #2   Instructed wife to call members primary care provider to make follow up appt, Instructed to keep log of blood sugars, Instructed to make follow up appt with Dr. Buddy Duty but to contact sooner if blood sugars are elevated,  Reinforced wife to call RNCM if she has any questions and she has number to contact [place] RN, [facility] Care Management Coordinator-Link to [place] Management [phone]

## 2016-03-25 DIAGNOSIS — G510 Bell's palsy: Secondary | ICD-10-CM | POA: Diagnosis not present

## 2016-03-25 DIAGNOSIS — I5022 Chronic systolic (congestive) heart failure: Secondary | ICD-10-CM | POA: Diagnosis not present

## 2016-03-25 DIAGNOSIS — E1165 Type 2 diabetes mellitus with hyperglycemia: Secondary | ICD-10-CM | POA: Diagnosis not present

## 2016-03-25 DIAGNOSIS — I5181 Takotsubo syndrome: Secondary | ICD-10-CM | POA: Diagnosis not present

## 2016-03-25 DIAGNOSIS — S066X9D Traumatic subarachnoid hemorrhage with loss of consciousness of unspecified duration, subsequent encounter: Secondary | ICD-10-CM | POA: Diagnosis not present

## 2016-03-25 DIAGNOSIS — R4189 Other symptoms and signs involving cognitive functions and awareness: Secondary | ICD-10-CM | POA: Diagnosis not present

## 2016-03-25 DIAGNOSIS — S0532XD Ocular laceration without prolapse or loss of intraocular tissue, left eye, subsequent encounter: Secondary | ICD-10-CM | POA: Diagnosis not present

## 2016-03-25 DIAGNOSIS — S065X9D Traumatic subdural hemorrhage with loss of consciousness of unspecified duration, subsequent encounter: Secondary | ICD-10-CM | POA: Diagnosis not present

## 2016-03-25 DIAGNOSIS — G250 Essential tremor: Secondary | ICD-10-CM | POA: Diagnosis not present

## 2016-03-25 MED FILL — TRUEPLUS SYR 0.5ML 31GX5/16: 31G X 5/16" | 50 days supply | Qty: 200 | Fill #0

## 2016-03-26 ENCOUNTER — Telehealth: Payer: Self-pay | Admitting: Neurology

## 2016-03-26 DIAGNOSIS — S066X9D Traumatic subarachnoid hemorrhage with loss of consciousness of unspecified duration, subsequent encounter: Secondary | ICD-10-CM | POA: Diagnosis not present

## 2016-03-26 DIAGNOSIS — S0532XD Ocular laceration without prolapse or loss of intraocular tissue, left eye, subsequent encounter: Secondary | ICD-10-CM | POA: Diagnosis not present

## 2016-03-26 DIAGNOSIS — I5022 Chronic systolic (congestive) heart failure: Secondary | ICD-10-CM | POA: Diagnosis not present

## 2016-03-26 DIAGNOSIS — R4189 Other symptoms and signs involving cognitive functions and awareness: Secondary | ICD-10-CM | POA: Diagnosis not present

## 2016-03-26 DIAGNOSIS — S065X9D Traumatic subdural hemorrhage with loss of consciousness of unspecified duration, subsequent encounter: Secondary | ICD-10-CM | POA: Diagnosis not present

## 2016-03-26 DIAGNOSIS — E1165 Type 2 diabetes mellitus with hyperglycemia: Secondary | ICD-10-CM | POA: Diagnosis not present

## 2016-03-26 DIAGNOSIS — G510 Bell's palsy: Secondary | ICD-10-CM | POA: Diagnosis not present

## 2016-03-26 DIAGNOSIS — G250 Essential tremor: Secondary | ICD-10-CM | POA: Diagnosis not present

## 2016-03-26 DIAGNOSIS — I5181 Takotsubo syndrome: Secondary | ICD-10-CM | POA: Diagnosis not present

## 2016-03-26 NOTE — Telephone Encounter (Signed)
Returned call to [place] - ok per Dr. Krista Blue to extend therapy - verbal orders provided.

## 2016-03-26 NOTE — Telephone Encounter (Signed)
[place], OT/[facility] [phone] called to request verbal order for OT, 2 x week for 3 weeks.

## 2016-03-27 DIAGNOSIS — I5181 Takotsubo syndrome: Secondary | ICD-10-CM | POA: Diagnosis not present

## 2016-03-27 DIAGNOSIS — R4189 Other symptoms and signs involving cognitive functions and awareness: Secondary | ICD-10-CM | POA: Diagnosis not present

## 2016-03-27 DIAGNOSIS — S0532XD Ocular laceration without prolapse or loss of intraocular tissue, left eye, subsequent encounter: Secondary | ICD-10-CM | POA: Diagnosis not present

## 2016-03-27 DIAGNOSIS — G510 Bell's palsy: Secondary | ICD-10-CM | POA: Diagnosis not present

## 2016-03-27 DIAGNOSIS — R296 Repeated falls: Secondary | ICD-10-CM | POA: Diagnosis not present

## 2016-03-27 DIAGNOSIS — G250 Essential tremor: Secondary | ICD-10-CM | POA: Diagnosis not present

## 2016-03-27 DIAGNOSIS — E1165 Type 2 diabetes mellitus with hyperglycemia: Secondary | ICD-10-CM | POA: Diagnosis not present

## 2016-03-27 DIAGNOSIS — I5022 Chronic systolic (congestive) heart failure: Secondary | ICD-10-CM | POA: Diagnosis not present

## 2016-03-27 DIAGNOSIS — S065X9D Traumatic subdural hemorrhage with loss of consciousness of unspecified duration, subsequent encounter: Secondary | ICD-10-CM | POA: Diagnosis not present

## 2016-03-27 DIAGNOSIS — M545 Low back pain: Secondary | ICD-10-CM | POA: Diagnosis not present

## 2016-03-27 DIAGNOSIS — S066X9D Traumatic subarachnoid hemorrhage with loss of consciousness of unspecified duration, subsequent encounter: Secondary | ICD-10-CM | POA: Diagnosis not present

## 2016-03-28 DIAGNOSIS — S066X9D Traumatic subarachnoid hemorrhage with loss of consciousness of unspecified duration, subsequent encounter: Secondary | ICD-10-CM | POA: Diagnosis not present

## 2016-03-28 DIAGNOSIS — G250 Essential tremor: Secondary | ICD-10-CM | POA: Diagnosis not present

## 2016-03-28 DIAGNOSIS — E1165 Type 2 diabetes mellitus with hyperglycemia: Secondary | ICD-10-CM | POA: Diagnosis not present

## 2016-03-28 DIAGNOSIS — I5181 Takotsubo syndrome: Secondary | ICD-10-CM | POA: Diagnosis not present

## 2016-03-28 DIAGNOSIS — S065X9D Traumatic subdural hemorrhage with loss of consciousness of unspecified duration, subsequent encounter: Secondary | ICD-10-CM | POA: Diagnosis not present

## 2016-03-28 DIAGNOSIS — R4189 Other symptoms and signs involving cognitive functions and awareness: Secondary | ICD-10-CM | POA: Diagnosis not present

## 2016-03-28 DIAGNOSIS — S0532XD Ocular laceration without prolapse or loss of intraocular tissue, left eye, subsequent encounter: Secondary | ICD-10-CM | POA: Diagnosis not present

## 2016-03-28 DIAGNOSIS — I5022 Chronic systolic (congestive) heart failure: Secondary | ICD-10-CM | POA: Diagnosis not present

## 2016-03-28 DIAGNOSIS — G510 Bell's palsy: Secondary | ICD-10-CM | POA: Diagnosis not present

## 2016-03-29 ENCOUNTER — Other Ambulatory Visit: Payer: Self-pay

## 2016-03-29 NOTE — Patient Outreach (Signed)
[facility]) Care Management  03/29/2016  ADD DINAPOLI [birth year] [id number]  Telephone call to follow up on member.   Spoke with member and with wife.  Member states he is doing much better.  State he is going upstairs to shower.  States he is still using a walker but is getting stronger.  States he is still getting home health nursing and therapies.  States he is to go see Dr.Kerr on 04/08/16.  Wife states that she faxed a copy of members blood sugar logs to Dr.Kerr and he will go over it with them on their visit.  States that his morning sugars in in the low 200s and before lunch and dinner they are raging150-180s.  Wife states that she is coping well with caring for member.  States she plans to change insurance 05/14/16. Plan to see member for Link to Wellness visit on 05/06/16.  THN CM Care Plan Problem Two        Most Recent Value   Care Plan Problem Two  Recent d/c from hospital for fall with readmission in 2 days   Role Documenting the Problem Two  Care Management [place] for Problem Two  Active   [facility]N CM Short Term Goal #1 (0-30 days)  Member will not have a readmission to hospital in the next 30 days   [facility]N CM Short Term Goal #1 Start Date  02/22/16   [facility] CM Short Term Goal #1 Met Date   03/29/16 [No hospitalization in the last 30 days]   Interventions for Short Term Goal #2   Reinforced to use walker and progress activity as tolerated with direction of physical therapy,  Reinforced wife to call RNCM if she has any questions and she has number to contact [place] RN, [facility] Care Management Coordinator-Link to [place] Management [phone]

## 2016-04-01 DIAGNOSIS — S0532XD Ocular laceration without prolapse or loss of intraocular tissue, left eye, subsequent encounter: Secondary | ICD-10-CM | POA: Diagnosis not present

## 2016-04-01 DIAGNOSIS — G510 Bell's palsy: Secondary | ICD-10-CM | POA: Diagnosis not present

## 2016-04-01 DIAGNOSIS — I5022 Chronic systolic (congestive) heart failure: Secondary | ICD-10-CM | POA: Diagnosis not present

## 2016-04-01 DIAGNOSIS — R4189 Other symptoms and signs involving cognitive functions and awareness: Secondary | ICD-10-CM | POA: Diagnosis not present

## 2016-04-01 DIAGNOSIS — E1165 Type 2 diabetes mellitus with hyperglycemia: Secondary | ICD-10-CM | POA: Diagnosis not present

## 2016-04-01 DIAGNOSIS — S065X9D Traumatic subdural hemorrhage with loss of consciousness of unspecified duration, subsequent encounter: Secondary | ICD-10-CM | POA: Diagnosis not present

## 2016-04-01 DIAGNOSIS — I5181 Takotsubo syndrome: Secondary | ICD-10-CM | POA: Diagnosis not present

## 2016-04-01 DIAGNOSIS — S066X9D Traumatic subarachnoid hemorrhage with loss of consciousness of unspecified duration, subsequent encounter: Secondary | ICD-10-CM | POA: Diagnosis not present

## 2016-04-01 DIAGNOSIS — G250 Essential tremor: Secondary | ICD-10-CM | POA: Diagnosis not present

## 2016-04-01 MED FILL — EPINEPHRINE 0.3 MG AUTO-INJ: 0.3 | 30 days supply | Qty: 2 | Fill #0

## 2016-04-02 ENCOUNTER — Ambulatory Visit (INDEPENDENT_AMBULATORY_CARE_PROVIDER_SITE_OTHER): Payer: 59 | Admitting: Neurology

## 2016-04-02 DIAGNOSIS — S065X9A Traumatic subdural hemorrhage with loss of consciousness of unspecified duration, initial encounter: Secondary | ICD-10-CM

## 2016-04-02 DIAGNOSIS — G510 Bell's palsy: Secondary | ICD-10-CM | POA: Diagnosis not present

## 2016-04-02 DIAGNOSIS — S0532XD Ocular laceration without prolapse or loss of intraocular tissue, left eye, subsequent encounter: Secondary | ICD-10-CM | POA: Diagnosis not present

## 2016-04-02 DIAGNOSIS — S066X9D Traumatic subarachnoid hemorrhage with loss of consciousness of unspecified duration, subsequent encounter: Secondary | ICD-10-CM | POA: Diagnosis not present

## 2016-04-02 DIAGNOSIS — R569 Unspecified convulsions: Secondary | ICD-10-CM | POA: Diagnosis not present

## 2016-04-02 DIAGNOSIS — R209 Unspecified disturbances of skin sensation: Secondary | ICD-10-CM

## 2016-04-02 DIAGNOSIS — I5022 Chronic systolic (congestive) heart failure: Secondary | ICD-10-CM | POA: Diagnosis not present

## 2016-04-02 DIAGNOSIS — R202 Paresthesia of skin: Secondary | ICD-10-CM

## 2016-04-02 DIAGNOSIS — G250 Essential tremor: Secondary | ICD-10-CM | POA: Diagnosis not present

## 2016-04-02 DIAGNOSIS — R4189 Other symptoms and signs involving cognitive functions and awareness: Secondary | ICD-10-CM | POA: Diagnosis not present

## 2016-04-02 DIAGNOSIS — Y92009 Unspecified place in unspecified non-institutional (private) residence as the place of occurrence of the external cause: Principal | ICD-10-CM

## 2016-04-02 DIAGNOSIS — W19XXXD Unspecified fall, subsequent encounter: Secondary | ICD-10-CM

## 2016-04-02 DIAGNOSIS — S069X0S Unspecified intracranial injury without loss of consciousness, sequela: Secondary | ICD-10-CM

## 2016-04-02 DIAGNOSIS — S065XAA Traumatic subdural hemorrhage with loss of consciousness status unknown, initial encounter: Secondary | ICD-10-CM

## 2016-04-02 DIAGNOSIS — E1165 Type 2 diabetes mellitus with hyperglycemia: Secondary | ICD-10-CM | POA: Diagnosis not present

## 2016-04-02 DIAGNOSIS — S065X9D Traumatic subdural hemorrhage with loss of consciousness of unspecified duration, subsequent encounter: Secondary | ICD-10-CM | POA: Diagnosis not present

## 2016-04-02 DIAGNOSIS — I5181 Takotsubo syndrome: Secondary | ICD-10-CM | POA: Diagnosis not present

## 2016-04-02 NOTE — Procedures (Signed)
   HISTORY: 75 years old gentleman, with history of insulin-dependent diabetes, presented with seizure since July 2016, initial event was associated with hypoglycemia, glucose level was thirties, recent fell down with subarachnoid hemorrhage in May 2017 with associated possible seizure activity  TECHNIQUE:  16 channel EEG was performed based on standard 10-16 international system. One channel was dedicated to EKG, which has demonstrates sinus rhythm of 54 beats per minutes.  Upon awakening, the posterior background activity was mildly dysrhythmic, in the theta range, 7 Hz, reactive to eye opening and closure.  There was no evidence of epileptiform discharge.  Photic stimulation and hyperventilation was not performed.  No sleep was achieved.  CONCLUSION: This is a mild abnormal awake EEG.  There is no electrodiagnostic evidence of epileptiform discharge, there is mild background slowing, indicating mild bi-hemisphere malfunction. Common etiology is metabolic toxic.

## 2016-04-03 DIAGNOSIS — E1165 Type 2 diabetes mellitus with hyperglycemia: Secondary | ICD-10-CM | POA: Diagnosis not present

## 2016-04-03 DIAGNOSIS — I5022 Chronic systolic (congestive) heart failure: Secondary | ICD-10-CM | POA: Diagnosis not present

## 2016-04-03 DIAGNOSIS — G510 Bell's palsy: Secondary | ICD-10-CM | POA: Diagnosis not present

## 2016-04-03 DIAGNOSIS — R4189 Other symptoms and signs involving cognitive functions and awareness: Secondary | ICD-10-CM | POA: Diagnosis not present

## 2016-04-03 DIAGNOSIS — S066X9D Traumatic subarachnoid hemorrhage with loss of consciousness of unspecified duration, subsequent encounter: Secondary | ICD-10-CM | POA: Diagnosis not present

## 2016-04-03 DIAGNOSIS — G250 Essential tremor: Secondary | ICD-10-CM | POA: Diagnosis not present

## 2016-04-03 DIAGNOSIS — S0532XD Ocular laceration without prolapse or loss of intraocular tissue, left eye, subsequent encounter: Secondary | ICD-10-CM | POA: Diagnosis not present

## 2016-04-03 DIAGNOSIS — S065X9D Traumatic subdural hemorrhage with loss of consciousness of unspecified duration, subsequent encounter: Secondary | ICD-10-CM | POA: Diagnosis not present

## 2016-04-03 DIAGNOSIS — I5181 Takotsubo syndrome: Secondary | ICD-10-CM | POA: Diagnosis not present

## 2016-04-04 DIAGNOSIS — S066X9D Traumatic subarachnoid hemorrhage with loss of consciousness of unspecified duration, subsequent encounter: Secondary | ICD-10-CM | POA: Diagnosis not present

## 2016-04-04 DIAGNOSIS — E1165 Type 2 diabetes mellitus with hyperglycemia: Secondary | ICD-10-CM | POA: Diagnosis not present

## 2016-04-04 DIAGNOSIS — I5181 Takotsubo syndrome: Secondary | ICD-10-CM | POA: Diagnosis not present

## 2016-04-04 DIAGNOSIS — S0532XD Ocular laceration without prolapse or loss of intraocular tissue, left eye, subsequent encounter: Secondary | ICD-10-CM | POA: Diagnosis not present

## 2016-04-04 DIAGNOSIS — G510 Bell's palsy: Secondary | ICD-10-CM | POA: Diagnosis not present

## 2016-04-04 DIAGNOSIS — I5022 Chronic systolic (congestive) heart failure: Secondary | ICD-10-CM | POA: Diagnosis not present

## 2016-04-04 DIAGNOSIS — G250 Essential tremor: Secondary | ICD-10-CM | POA: Diagnosis not present

## 2016-04-04 DIAGNOSIS — S065X9D Traumatic subdural hemorrhage with loss of consciousness of unspecified duration, subsequent encounter: Secondary | ICD-10-CM | POA: Diagnosis not present

## 2016-04-04 DIAGNOSIS — R4189 Other symptoms and signs involving cognitive functions and awareness: Secondary | ICD-10-CM | POA: Diagnosis not present

## 2016-04-05 DIAGNOSIS — I5181 Takotsubo syndrome: Secondary | ICD-10-CM | POA: Diagnosis not present

## 2016-04-05 DIAGNOSIS — S066X9D Traumatic subarachnoid hemorrhage with loss of consciousness of unspecified duration, subsequent encounter: Secondary | ICD-10-CM | POA: Diagnosis not present

## 2016-04-05 DIAGNOSIS — I5022 Chronic systolic (congestive) heart failure: Secondary | ICD-10-CM | POA: Diagnosis not present

## 2016-04-05 DIAGNOSIS — E1165 Type 2 diabetes mellitus with hyperglycemia: Secondary | ICD-10-CM | POA: Diagnosis not present

## 2016-04-05 DIAGNOSIS — S0532XD Ocular laceration without prolapse or loss of intraocular tissue, left eye, subsequent encounter: Secondary | ICD-10-CM | POA: Diagnosis not present

## 2016-04-05 DIAGNOSIS — G250 Essential tremor: Secondary | ICD-10-CM | POA: Diagnosis not present

## 2016-04-05 DIAGNOSIS — R4189 Other symptoms and signs involving cognitive functions and awareness: Secondary | ICD-10-CM | POA: Diagnosis not present

## 2016-04-05 DIAGNOSIS — S065X9D Traumatic subdural hemorrhage with loss of consciousness of unspecified duration, subsequent encounter: Secondary | ICD-10-CM | POA: Diagnosis not present

## 2016-04-05 DIAGNOSIS — G510 Bell's palsy: Secondary | ICD-10-CM | POA: Diagnosis not present

## 2016-04-08 DIAGNOSIS — S066X9D Traumatic subarachnoid hemorrhage with loss of consciousness of unspecified duration, subsequent encounter: Secondary | ICD-10-CM | POA: Diagnosis not present

## 2016-04-08 DIAGNOSIS — Z794 Long term (current) use of insulin: Secondary | ICD-10-CM | POA: Diagnosis not present

## 2016-04-08 DIAGNOSIS — G510 Bell's palsy: Secondary | ICD-10-CM | POA: Diagnosis not present

## 2016-04-08 DIAGNOSIS — E1140 Type 2 diabetes mellitus with diabetic neuropathy, unspecified: Secondary | ICD-10-CM | POA: Diagnosis not present

## 2016-04-08 DIAGNOSIS — I5181 Takotsubo syndrome: Secondary | ICD-10-CM | POA: Diagnosis not present

## 2016-04-08 DIAGNOSIS — E1165 Type 2 diabetes mellitus with hyperglycemia: Secondary | ICD-10-CM | POA: Diagnosis not present

## 2016-04-08 DIAGNOSIS — R202 Paresthesia of skin: Secondary | ICD-10-CM | POA: Diagnosis not present

## 2016-04-08 DIAGNOSIS — G250 Essential tremor: Secondary | ICD-10-CM | POA: Diagnosis not present

## 2016-04-08 DIAGNOSIS — S0532XD Ocular laceration without prolapse or loss of intraocular tissue, left eye, subsequent encounter: Secondary | ICD-10-CM | POA: Diagnosis not present

## 2016-04-08 DIAGNOSIS — I5022 Chronic systolic (congestive) heart failure: Secondary | ICD-10-CM | POA: Diagnosis not present

## 2016-04-08 DIAGNOSIS — R4189 Other symptoms and signs involving cognitive functions and awareness: Secondary | ICD-10-CM | POA: Diagnosis not present

## 2016-04-08 DIAGNOSIS — S065X9D Traumatic subdural hemorrhage with loss of consciousness of unspecified duration, subsequent encounter: Secondary | ICD-10-CM | POA: Diagnosis not present

## 2016-04-09 DIAGNOSIS — S0532XD Ocular laceration without prolapse or loss of intraocular tissue, left eye, subsequent encounter: Secondary | ICD-10-CM | POA: Diagnosis not present

## 2016-04-09 DIAGNOSIS — G510 Bell's palsy: Secondary | ICD-10-CM | POA: Diagnosis not present

## 2016-04-09 DIAGNOSIS — G250 Essential tremor: Secondary | ICD-10-CM | POA: Diagnosis not present

## 2016-04-09 DIAGNOSIS — I5022 Chronic systolic (congestive) heart failure: Secondary | ICD-10-CM | POA: Diagnosis not present

## 2016-04-09 DIAGNOSIS — I5181 Takotsubo syndrome: Secondary | ICD-10-CM | POA: Diagnosis not present

## 2016-04-09 DIAGNOSIS — E1165 Type 2 diabetes mellitus with hyperglycemia: Secondary | ICD-10-CM | POA: Diagnosis not present

## 2016-04-09 DIAGNOSIS — S065X9D Traumatic subdural hemorrhage with loss of consciousness of unspecified duration, subsequent encounter: Secondary | ICD-10-CM | POA: Diagnosis not present

## 2016-04-09 DIAGNOSIS — S066X9D Traumatic subarachnoid hemorrhage with loss of consciousness of unspecified duration, subsequent encounter: Secondary | ICD-10-CM | POA: Diagnosis not present

## 2016-04-09 DIAGNOSIS — R4189 Other symptoms and signs involving cognitive functions and awareness: Secondary | ICD-10-CM | POA: Diagnosis not present

## 2016-04-10 DIAGNOSIS — G510 Bell's palsy: Secondary | ICD-10-CM | POA: Diagnosis not present

## 2016-04-10 DIAGNOSIS — S066X9D Traumatic subarachnoid hemorrhage with loss of consciousness of unspecified duration, subsequent encounter: Secondary | ICD-10-CM | POA: Diagnosis not present

## 2016-04-10 DIAGNOSIS — I5181 Takotsubo syndrome: Secondary | ICD-10-CM | POA: Diagnosis not present

## 2016-04-10 DIAGNOSIS — I5022 Chronic systolic (congestive) heart failure: Secondary | ICD-10-CM | POA: Diagnosis not present

## 2016-04-10 DIAGNOSIS — S0532XD Ocular laceration without prolapse or loss of intraocular tissue, left eye, subsequent encounter: Secondary | ICD-10-CM | POA: Diagnosis not present

## 2016-04-10 DIAGNOSIS — E1165 Type 2 diabetes mellitus with hyperglycemia: Secondary | ICD-10-CM | POA: Diagnosis not present

## 2016-04-10 DIAGNOSIS — R4189 Other symptoms and signs involving cognitive functions and awareness: Secondary | ICD-10-CM | POA: Diagnosis not present

## 2016-04-10 DIAGNOSIS — G250 Essential tremor: Secondary | ICD-10-CM | POA: Diagnosis not present

## 2016-04-10 DIAGNOSIS — S065X9D Traumatic subdural hemorrhage with loss of consciousness of unspecified duration, subsequent encounter: Secondary | ICD-10-CM | POA: Diagnosis not present

## 2016-04-11 DIAGNOSIS — E1165 Type 2 diabetes mellitus with hyperglycemia: Secondary | ICD-10-CM | POA: Diagnosis not present

## 2016-04-11 DIAGNOSIS — I5022 Chronic systolic (congestive) heart failure: Secondary | ICD-10-CM | POA: Diagnosis not present

## 2016-04-11 DIAGNOSIS — S065X9D Traumatic subdural hemorrhage with loss of consciousness of unspecified duration, subsequent encounter: Secondary | ICD-10-CM | POA: Diagnosis not present

## 2016-04-11 DIAGNOSIS — G250 Essential tremor: Secondary | ICD-10-CM | POA: Diagnosis not present

## 2016-04-11 DIAGNOSIS — G510 Bell's palsy: Secondary | ICD-10-CM | POA: Diagnosis not present

## 2016-04-11 DIAGNOSIS — I5181 Takotsubo syndrome: Secondary | ICD-10-CM | POA: Diagnosis not present

## 2016-04-11 DIAGNOSIS — S066X9D Traumatic subarachnoid hemorrhage with loss of consciousness of unspecified duration, subsequent encounter: Secondary | ICD-10-CM | POA: Diagnosis not present

## 2016-04-11 DIAGNOSIS — R4189 Other symptoms and signs involving cognitive functions and awareness: Secondary | ICD-10-CM | POA: Diagnosis not present

## 2016-04-11 DIAGNOSIS — S0532XD Ocular laceration without prolapse or loss of intraocular tissue, left eye, subsequent encounter: Secondary | ICD-10-CM | POA: Diagnosis not present

## 2016-04-12 DIAGNOSIS — S066X9D Traumatic subarachnoid hemorrhage with loss of consciousness of unspecified duration, subsequent encounter: Secondary | ICD-10-CM | POA: Diagnosis not present

## 2016-04-12 DIAGNOSIS — G510 Bell's palsy: Secondary | ICD-10-CM | POA: Diagnosis not present

## 2016-04-12 DIAGNOSIS — R4189 Other symptoms and signs involving cognitive functions and awareness: Secondary | ICD-10-CM | POA: Diagnosis not present

## 2016-04-12 DIAGNOSIS — S0532XD Ocular laceration without prolapse or loss of intraocular tissue, left eye, subsequent encounter: Secondary | ICD-10-CM | POA: Diagnosis not present

## 2016-04-12 DIAGNOSIS — G250 Essential tremor: Secondary | ICD-10-CM | POA: Diagnosis not present

## 2016-04-12 DIAGNOSIS — S065X9D Traumatic subdural hemorrhage with loss of consciousness of unspecified duration, subsequent encounter: Secondary | ICD-10-CM | POA: Diagnosis not present

## 2016-04-12 DIAGNOSIS — I5022 Chronic systolic (congestive) heart failure: Secondary | ICD-10-CM | POA: Diagnosis not present

## 2016-04-12 DIAGNOSIS — I5181 Takotsubo syndrome: Secondary | ICD-10-CM | POA: Diagnosis not present

## 2016-04-12 DIAGNOSIS — E1165 Type 2 diabetes mellitus with hyperglycemia: Secondary | ICD-10-CM | POA: Diagnosis not present

## 2016-04-15 ENCOUNTER — Telehealth: Payer: Self-pay | Admitting: Neurology

## 2016-04-15 DIAGNOSIS — R4189 Other symptoms and signs involving cognitive functions and awareness: Secondary | ICD-10-CM | POA: Diagnosis not present

## 2016-04-15 DIAGNOSIS — S0532XD Ocular laceration without prolapse or loss of intraocular tissue, left eye, subsequent encounter: Secondary | ICD-10-CM | POA: Diagnosis not present

## 2016-04-15 DIAGNOSIS — S066X9D Traumatic subarachnoid hemorrhage with loss of consciousness of unspecified duration, subsequent encounter: Secondary | ICD-10-CM | POA: Diagnosis not present

## 2016-04-15 DIAGNOSIS — I5022 Chronic systolic (congestive) heart failure: Secondary | ICD-10-CM | POA: Diagnosis not present

## 2016-04-15 DIAGNOSIS — S065X9D Traumatic subdural hemorrhage with loss of consciousness of unspecified duration, subsequent encounter: Secondary | ICD-10-CM | POA: Diagnosis not present

## 2016-04-15 DIAGNOSIS — I5181 Takotsubo syndrome: Secondary | ICD-10-CM | POA: Diagnosis not present

## 2016-04-15 DIAGNOSIS — E1165 Type 2 diabetes mellitus with hyperglycemia: Secondary | ICD-10-CM | POA: Diagnosis not present

## 2016-04-15 DIAGNOSIS — G250 Essential tremor: Secondary | ICD-10-CM | POA: Diagnosis not present

## 2016-04-15 DIAGNOSIS — G510 Bell's palsy: Secondary | ICD-10-CM | POA: Diagnosis not present

## 2016-04-15 MED ORDER — PROPRANOLOL HCL 10 MG PO TABS: 10.0000 mg | ORAL_TABLET | Freq: Two times a day (BID) | ORAL | Status: AC

## 2016-04-15 NOTE — Telephone Encounter (Signed)
Jefferey Pica called to request refill of propranolol (INDERAL) 10 MG tablet

## 2016-04-15 NOTE — Telephone Encounter (Signed)
Per vo by Dr. Krista Blue, ok to provide rx for propranolol 10mg , BID.

## 2016-04-15 NOTE — Telephone Encounter (Signed)
Spoke to patient's wife, [name] (on HIPPA) - says his propranolol was reduced to 10mg , BID at [facility] and his tremors seen to be doing well on this dose.

## 2016-04-17 DIAGNOSIS — R569 Unspecified convulsions: Secondary | ICD-10-CM | POA: Diagnosis not present

## 2016-04-17 DIAGNOSIS — R269 Unspecified abnormalities of gait and mobility: Secondary | ICD-10-CM | POA: Diagnosis not present

## 2016-04-17 DIAGNOSIS — R2689 Other abnormalities of gait and mobility: Secondary | ICD-10-CM | POA: Diagnosis not present

## 2016-04-17 DIAGNOSIS — S065X9D Traumatic subdural hemorrhage with loss of consciousness of unspecified duration, subsequent encounter: Secondary | ICD-10-CM | POA: Diagnosis not present

## 2016-04-17 DIAGNOSIS — R531 Weakness: Secondary | ICD-10-CM | POA: Diagnosis not present

## 2016-04-17 DIAGNOSIS — G250 Essential tremor: Secondary | ICD-10-CM | POA: Diagnosis not present

## 2016-04-17 MED FILL — ONE TOUCH ULTRA TEST STRIPS: 90 days supply | Qty: 800 | Fill #0

## 2016-04-17 MED FILL — PROPRANOLOL 10 MG TABLET: 10 | 30 days supply | Qty: 60 | Fill #0

## 2016-04-18 DIAGNOSIS — I5181 Takotsubo syndrome: Secondary | ICD-10-CM | POA: Diagnosis not present

## 2016-04-18 DIAGNOSIS — R4189 Other symptoms and signs involving cognitive functions and awareness: Secondary | ICD-10-CM | POA: Diagnosis not present

## 2016-04-18 DIAGNOSIS — M6281 Muscle weakness (generalized): Secondary | ICD-10-CM | POA: Diagnosis not present

## 2016-04-18 DIAGNOSIS — G510 Bell's palsy: Secondary | ICD-10-CM | POA: Diagnosis not present

## 2016-04-18 DIAGNOSIS — G250 Essential tremor: Secondary | ICD-10-CM | POA: Diagnosis not present

## 2016-04-18 DIAGNOSIS — S065X9D Traumatic subdural hemorrhage with loss of consciousness of unspecified duration, subsequent encounter: Secondary | ICD-10-CM | POA: Diagnosis not present

## 2016-04-18 DIAGNOSIS — E1165 Type 2 diabetes mellitus with hyperglycemia: Secondary | ICD-10-CM | POA: Diagnosis not present

## 2016-04-18 DIAGNOSIS — S0532XD Ocular laceration without prolapse or loss of intraocular tissue, left eye, subsequent encounter: Secondary | ICD-10-CM | POA: Diagnosis not present

## 2016-04-18 DIAGNOSIS — S066X9D Traumatic subarachnoid hemorrhage with loss of consciousness of unspecified duration, subsequent encounter: Secondary | ICD-10-CM | POA: Diagnosis not present

## 2016-04-18 DIAGNOSIS — I5022 Chronic systolic (congestive) heart failure: Secondary | ICD-10-CM | POA: Diagnosis not present

## 2016-04-22 ENCOUNTER — Other Ambulatory Visit: Payer: 59

## 2016-04-22 DIAGNOSIS — R4189 Other symptoms and signs involving cognitive functions and awareness: Secondary | ICD-10-CM | POA: Diagnosis not present

## 2016-04-22 DIAGNOSIS — I5022 Chronic systolic (congestive) heart failure: Secondary | ICD-10-CM | POA: Diagnosis not present

## 2016-04-22 DIAGNOSIS — G510 Bell's palsy: Secondary | ICD-10-CM | POA: Diagnosis not present

## 2016-04-22 DIAGNOSIS — E1165 Type 2 diabetes mellitus with hyperglycemia: Secondary | ICD-10-CM | POA: Diagnosis not present

## 2016-04-22 DIAGNOSIS — S0532XD Ocular laceration without prolapse or loss of intraocular tissue, left eye, subsequent encounter: Secondary | ICD-10-CM | POA: Diagnosis not present

## 2016-04-22 DIAGNOSIS — S066X9D Traumatic subarachnoid hemorrhage with loss of consciousness of unspecified duration, subsequent encounter: Secondary | ICD-10-CM | POA: Diagnosis not present

## 2016-04-22 DIAGNOSIS — S065X9D Traumatic subdural hemorrhage with loss of consciousness of unspecified duration, subsequent encounter: Secondary | ICD-10-CM | POA: Diagnosis not present

## 2016-04-22 DIAGNOSIS — M6281 Muscle weakness (generalized): Secondary | ICD-10-CM | POA: Diagnosis not present

## 2016-04-22 DIAGNOSIS — E1140 Type 2 diabetes mellitus with diabetic neuropathy, unspecified: Secondary | ICD-10-CM | POA: Diagnosis not present

## 2016-04-22 DIAGNOSIS — R531 Weakness: Secondary | ICD-10-CM | POA: Diagnosis not present

## 2016-04-22 DIAGNOSIS — I5181 Takotsubo syndrome: Secondary | ICD-10-CM | POA: Diagnosis not present

## 2016-04-22 DIAGNOSIS — Z794 Long term (current) use of insulin: Secondary | ICD-10-CM | POA: Diagnosis not present

## 2016-04-22 DIAGNOSIS — G250 Essential tremor: Secondary | ICD-10-CM | POA: Diagnosis not present

## 2016-04-24 DIAGNOSIS — R4189 Other symptoms and signs involving cognitive functions and awareness: Secondary | ICD-10-CM | POA: Diagnosis not present

## 2016-04-24 DIAGNOSIS — I5181 Takotsubo syndrome: Secondary | ICD-10-CM | POA: Diagnosis not present

## 2016-04-24 DIAGNOSIS — S065X9D Traumatic subdural hemorrhage with loss of consciousness of unspecified duration, subsequent encounter: Secondary | ICD-10-CM | POA: Diagnosis not present

## 2016-04-24 DIAGNOSIS — G250 Essential tremor: Secondary | ICD-10-CM | POA: Diagnosis not present

## 2016-04-24 DIAGNOSIS — S066X9D Traumatic subarachnoid hemorrhage with loss of consciousness of unspecified duration, subsequent encounter: Secondary | ICD-10-CM | POA: Diagnosis not present

## 2016-04-24 DIAGNOSIS — G510 Bell's palsy: Secondary | ICD-10-CM | POA: Diagnosis not present

## 2016-04-24 DIAGNOSIS — S0532XD Ocular laceration without prolapse or loss of intraocular tissue, left eye, subsequent encounter: Secondary | ICD-10-CM | POA: Diagnosis not present

## 2016-04-24 DIAGNOSIS — E1165 Type 2 diabetes mellitus with hyperglycemia: Secondary | ICD-10-CM | POA: Diagnosis not present

## 2016-04-24 DIAGNOSIS — I5022 Chronic systolic (congestive) heart failure: Secondary | ICD-10-CM | POA: Diagnosis not present

## 2016-04-29 ENCOUNTER — Encounter: Payer: Self-pay | Admitting: Neurology

## 2016-04-29 ENCOUNTER — Ambulatory Visit: Payer: 59

## 2016-04-29 ENCOUNTER — Ambulatory Visit (INDEPENDENT_AMBULATORY_CARE_PROVIDER_SITE_OTHER): Payer: 59 | Admitting: Neurology

## 2016-04-29 VITALS — BP 109/68 | HR 60 | Ht 67.0 in | Wt 172.2 lb

## 2016-04-29 DIAGNOSIS — R413 Other amnesia: Secondary | ICD-10-CM | POA: Insufficient documentation

## 2016-04-29 DIAGNOSIS — R569 Unspecified convulsions: Secondary | ICD-10-CM | POA: Diagnosis not present

## 2016-04-29 DIAGNOSIS — G250 Essential tremor: Secondary | ICD-10-CM | POA: Diagnosis not present

## 2016-04-29 DIAGNOSIS — I629 Nontraumatic intracranial hemorrhage, unspecified: Secondary | ICD-10-CM

## 2016-04-29 NOTE — Progress Notes (Signed)
Chief Complaint  Patient presents with  . Fall    He is here with his wife, Dale Morrow.  He has been discharged from rehab and is now back at home.  He is still participating in PT and his gait has improved.  He is using a cane or walker for help.  . Memory Loss    MMSE 21/30 - 10 animals.  His short term memory has continued to decline.  [name] Possible Seizures    They would like to review his EEG results.  He is doing well with his increased dose of Lamictal.      Dale Morrow is a 75 years old referred by primary care physician for evaluation of tremor and left hemifacial spasm.  He has family history of tremor, mother at her old age,  his sister, maternal uncle all had bilateral hand tremor, he also has past medical history of hypertension, diabetes, insulin pump, hyperlipidemia, prostate cancer, status post radiation therapy history of left knee replacement  He  has a history of bilateral Bell's palsy, with residual left upper and lower face weakness  He noticed bilateral hand shaking since 2004,  difficulty writing, holding utensils, around 2009, he also noticed voice change, there was a tremorous quality to it  He denies loss sense of smell, and there was no REM sleep disorder, no gait difficulty. TSH was normal.   He was placed on propranolol in 2013, with moderate response, with bp at low normal side 100/66, primidone 50mg  qhs was added on since Nov 2013, He reported mild  improvement with primidone 50 mg 2 tablets twice a day.   I saw him regularly in the past for EMG guided xeomin injection since November 2014, he responded very well, last injection was in May of 2015. He had bilateral blephoplasty by [place] ophthalmologist in February 2016,  which has helped his left facial spasm, he also had left knee redo in April 2016, still complains of moderate constant left knee pain, on Dilaudid, oxycodone treatment, is going for rehabilitation, sometimes his left knee is so painful, make him  nauseous,  He came back to clinic today complains of worsening bilateral hands tremor, difficulty holding a cup of coffee, holding utensils, cause inconvenient, social embarrassment, also noticed head titubation, increase voice tremor, needs to repeat himself sometimes.  He is now taking propranolol 60 mg twice a day, primidone 50 mg 2 tablets 3 times a day, propranolol works better for him, blood pressure was in the range of 130-140.  UPDATE Jul 24 2015: He was admitted to the hospital in July 2016 for prolonged seizure due to hypoglycemia, blood glucose was dropped to 30s, EEG that time showed mild background slowing. He is now on insulin pump, also had left carpal tunnel, ulnar decompression surgery, which did help his complains of left hand paresthesia,  He has mild unsteady gait, continue complains of mild bilateral hands tremor, he is taking Inderal 80 mg twice a day, primidone 50 mg 3 times a day  UPDATE Oct 30 2015: He recently had MRI cervical spine by Dr. Burney Gauze, he still has bilateral hand paresthesia, no gait difficulty, no incontinence.  His diabetes is under good control, use insulin pump. He still has intermittent hand tremor, taking inderal LA 60mg  daily.  Update June first 2017: He was doing very well until May first 2017, he was found down at home, with severe left eye swelling, bruise, confused, he was taken to the emergency room, upon arrival, glucose  level more than 600, he has no recollection of the event,  We have personally reviewed CAT scan: acute traumatic subarachnoid hemorrhage involving both cerebral hemispheres as above, with additional 13 mm parenchymal contusion at the left temporal pole. Small volume acute subdural hemorrhage overlying the right frontal convexity and falx.  Small volume acute intraventricular hemorrhage within the occipital horns of both lateral ventricles.  Large left frontal/periorbital scalp contusion. No calvarial fracture.  EEG showed  diffuse slowing, he is now at the rehabilitation,  Reviewed laboratory, mild anemia hemoglobin 10 point 9, normal BMP, negative troponin  UPDATE July 17th 2017: he complains of short term memory loss, He could not remember that he had shower, will do it again, EEG showed slowing, no epileptiform, he is tolerating lamotrigine 100mg  bid, he is now back home, he is still receiving PT at home, he complains of left knee pain,   He only has mild bilateral hands tremor, taking primidone 50 mg 2 tabs po tid, will taper off primidone,    REVIEW OF SYSTEMS: Full 14 system review of systems performed and notable only for unsteady gait, left facial weakness, memory loss, dizziness  ALLERGIES: Allergies  Allergen Reactions  . Bee Venom Anaphylaxis  . Shellfish Allergy Anaphylaxis  . Augmentin [Amoxicillin-Pot Clavulanate] Rash    HOME MEDICATIONS: Current Outpatient Prescriptions  Medication Sig Dispense Refill  . acetaminophen (TYLENOL) 325 MG tablet Take 650 mg by mouth. Take 2 tablets every 8 hours - at 0600, 1400, and 2200    . CALCIUM-VITAMIN D PO Take 1 tablet by mouth daily at 12 noon.    . docusate sodium (COLACE) 100 MG capsule Take 100 mg by mouth. Take one capsule twice daily    . escitalopram (LEXAPRO) 20 MG tablet Take 20 mg by mouth daily.    . insulin aspart (NOVOLOG) 100 UNIT/ML injection 0-15 Units, Subcutaneous, 3 times daily with meals,  CBG < 70: implement hypoglycemia protocol CBG 70 - 120: 0 units CBG 121 - 150: 2 units CBG 151 - 200: 3 units CBG 201 - 250: 5 units CBG 251 - 300: 8 units CBG 301 - 350: 11 units CBG 351 - 400: 15 units CBG > 400: call MD  11  . insulin aspart (NOVOLOG) 100 UNIT/ML injection Inject 4 Units into the skin 3 (three) times daily with meals.    . Insulin Glargine (LANTUS) 100 UNIT/ML Solostar Pen Inject 21 Units into the skin daily.    [name] lamoTRIgine (LAMICTAL) 100 MG tablet Take 1 tablet (100 mg total) by mouth 2 (two) times daily. 60 tablet  11  . Multiple Vitamins-Minerals (ONE-A-DAY MENS 50+ ADVANTAGE PO) Take 1 tablet by mouth daily at 12 noon.    . polyethylene glycol (MIRALAX / GLYCOLAX) packet Take 17 g by mouth at bedtime.    . primidone (MYSOLINE) 50 MG tablet TAKE 2 TABLETS BY MOUTH 3 TIMES DAILY. 180 tablet 11  . propranolol (INDERAL) 10 MG tablet Take 1 tablet (10 mg total) by mouth 2 (two) times daily. 60 tablet 11  . rosuvastatin (CRESTOR) 10 MG tablet Take 10 mg by mouth daily.     No current facility-administered medications for this visit.    PAST MEDICAL HISTORY: Past Medical History  Diagnosis Date  . Arthritis     left knee  . Depression   . De Quervain's tenosynovitis, left 04/2012  . Tremors of nervous system     takes propranolol for tremors  . Prostate cancer, primary, with metastasis from  prostate to other site [facility])     a. metastasis to lymph nodes;  b. s/p XRT.  [name] Complication of anesthesia     pt denies ponv, pt woke up during colonscopy in past  . Diabetes mellitus     Insulin pump, takes lisinopril for kidneys, dr Buddy Duty endocrinologist  . Left-sided Bell's palsy   . Right-sided Bell's palsy   . Nonischemic cardiomyopathy ([place])     a. 04/2015 Echo: EF 25-30%, mid apical, antseptal, anterolat, inflat, infsept, apical AK. ? takotsubo. No thrombus. PASP 46mmHg;  b. 04/2014 Lexi MV: EF 29%, fixed mod size and intensity mid-dist ant, apical, inferior scar w/ wma's;  c. 04/2015 Cath: LM nl, LAD 50p/m, LCX 25p, RI nl, RCA nl, RPAV 50d-->Med Rx.  . Chronic systolic CHF (congestive heart failure) ([place])     a. 04/2015 Echo: EF 25-30%.  [name] GERD (gastroesophageal reflux disease)   . Fever   . SDH (subdural hematoma) ([place])   . TBI (traumatic brain injury) ([place])     With [facility] and SDH  . Normocytic anemia   . Intracranial hemorrhage ([place])   . Aspiration pneumonia ([place])   . Status epilepticus ([place])     Due to hypoglycemia  . Essential tremor   . PONV (postoperative nausea and vomiting)   . Laceration of  left eye     Traumatic, with significant swelling  . Seizure-like activity ([place])   . Takotsubo cardiomyopathy     H/O, EF 55%    PAST SURGICAL HISTORY: Past Surgical History  Procedure Laterality Date  . Hemorrhoid surgery  1970s  . Anterior cruciate ligament repair      right knee  . Wrist arthroscopy  05/03/2010    right; and release of 1st dorsal compartment  . Knee arthrotomy  03/18/2008    left; with scar exc.  [name] Knee arthroscopy  09/25/2007; 04/15/2005    left  . Knee joint manipulation  06/29/2007    left  . Total knee arthroplasty  04/29/2007    left  . Dorsal compartment release  04/21/2012    Procedure: RELEASE DORSAL COMPARTMENT (DEQUERVAIN);  Surgeon: Cammie Sickle., MD;  Location: [facility];  Service: Orthopedics;  Laterality: Left;  left 1st dorsal compartment release left wrist   . Minor amputation of digit Left 07/21/2013    Procedure: LEFT SMALL REVISION AMPUTATION (MINOR PROCEDURE) ;  Surgeon: Schuyler Amor, MD;  Location: [place];  Service: Orthopedics;  Laterality: Left;  . Colonoscopy with propofol N/A 04/07/2014    Procedure: COLONOSCOPY WITH PROPOFOL;  Surgeon: Jeryl Columbia, MD;  Location: [facility];  Service: Endoscopy;  Laterality: N/A;  . Total knee revision Left 01/25/2015    Procedure: LEFT TOTAL KNEE  ARTHOPLASTY REVISION;  Surgeon: Gaynelle Arabian, MD;  Location: [facility];  Service: Orthopedics;  Laterality: Left;  . Cardiac catheterization N/A 04/26/2015    Procedure: Left Heart Cath and Coronary Angiography;  Surgeon: Jettie Booze, MD;  Location: [place] [facility];  Service: Cardiovascular;  Laterality: N/A;  . Cardiac catheterization  04/26/2015    Procedure: Intravascular Ultrasound/IVUS;  Surgeon: Jettie Booze, MD;  Location: [place] [facility];  Service: Cardiovascular;;  . Carpal tunnel release Left 07/05/2015    Procedure: LEFT CARPAL TUNNEL RELEASE;  Surgeon: Charlotte Crumb, MD;  Location: [place];  Service: Orthopedics;  Laterality: Left;  . Ulnar tunnel release Left 07/05/2015    Procedure: LEFT CUBITAL TUNNEL RELEASE;  Surgeon: Charlotte Crumb, MD;  Location: [place];  Service: Orthopedics;  Laterality: Left;    FAMILY HISTORY: Family History  Problem Relation Age of Onset  . Heart Problems Father   . Heart attack Father   . Hypertension Mother   . Stroke Mother     SOCIAL HISTORY:  Social History   Social History  . Marital Status: Married    Spouse Name: Dale Morrow  . Number of Children: o  . Years of Education: college   Occupational History  . retired Orthoptist    Social History Main Topics  . Smoking status: Former Smoker -- 15 years    Types: Cigarettes    Quit date: 10/13/1979  . Smokeless tobacco: Never Used  . Alcohol Use: No  . Drug Use: No  . Sexual Activity: Not on file   Other Topics Concern  . Not on file   Social History Narrative   Patient lives at home with his wife Dale Morrow) and there four dogs.Admitted to [facility]/20/16   Retired-Sales Rrepresentative   Education two years of college.   Right handed.   Caffeine one cup of coffee daily.   Former smoker-stopped 1980   Alcohol none   FULL CODE     PHYSICAL EXAM   Filed Vitals:   04/29/16 0947  BP: 109/68  Pulse: 60  Height: 5\' 7"  (1.702 m)  Weight: 172 lb 4 oz (78.132 kg)    Not recorded      Body mass index is 26.97 kg/(m^2).  PHYSICAL EXAMNIATION:  Gen: NAD, conversant, well nourised, obese, well groomed                     Cardiovascular: Regular rate rhythm, no peripheral edema, warm, nontender. Eyes: Conjunctivae clear without exudates or hemorrhage Neck: Supple, no carotid bruise. Pulmonary: Clear to auscultation bilaterally   NEUROLOGICAL EXAM:  MENTAL STATUS: Speech:    Speech is normal; fluent and spontaneous with normal comprehension.  Cognition:Mini-Mental Status Examination 21/30, animal naming 10     Orientation: He is not oriented to year,    recent and remote memory: He missed 3/3 recalls     language fluent; he has difficulty copy design or write a full sentence    attention, concentration, he has difficulty spelling word backwards     fund of knowledge  CRANIAL NERVES: CN II: Visual fields are full to confrontation. Pupils are 4 mm and briskly reactive to light. Visual acuity is 20/20 bilaterally. CN III, IV, VI: extraocular movement are normal. No ptosis. CN V: Facial sensation is intact to pinprick in all 3 divisions bilaterally. Corneal responses are intact.  CN VII: Face is asymmetry, he has mild left eye-closure, cheek puff weakness. CN VIII: Hearing is normal to rubbing fingers CN IX, X: Palate elevates symmetrically. Phonation is normal. CN XI: Head turning and shoulder shrug are intact CN XII: Tongue is midline with normal movements and no atrophy.  MOTOR:  He has bilateral hands postural tremor,. Muscle bulk and tone are normal. He has mild bilateral ankle dorsiflexion weakness    REFLEXES: Reflexes are 2+ and symmetric at the biceps, triceps, knees, and absent at  ankles. Plantar responses are flexor.  SENSORY:  length dependent decreased to Light touch, pinprick and vibratory sensation to distal shin level  Corrdination: Rapid alternating movements and fine finger movements are intact. There is no dysmetria on finger-to-nose and heel-knee-shin.   GAIT/STANCE: He needs push up to get  up from seated position, mildly unsteady  DIAGNOSTIC DATA (LABS, IMAGING, TESTING) - I reviewed patient records, labs, notes, testing and imaging myself where available.  ASSESSMENT AND PLAN  Dale Morrow is a 75 y.o. male   Mild cognitive impairment  Mini-Mental Status Examination is 21/30, animal naming is 10,   Likely due to combination of central nervous system degenerative disorder, repeat hypoglycemia episode, brain trauma,  May consider starting Namenda, and  Aricept  If he has much improved mood with lamotrigine, may consider tapering off Lexapro,  Essential tremor,  Mild, will taper off primidone  Tolerating inderal 10 mg twice a day  Left hemifacial spasm,   overall much improved after his bilateral blepharoplasty Status epilepticus in July 2016 due to hypoglycemia, recurrent sudden onset confusion and fall, with intracranial bleeding in Feb 12 2016 with associated glucose level >600  Highly suspicious for recurrent seizure  Repeat EEG showed mild slowing, no epileptiform discharges.   He is tolerating Lamictal 100 mg twice a day   Cervical degenerative changes, bilateral hands paresthesia  He was not sure that Lyrica has helped him, will taper off Lyrica 100 mg 3 times a day Insulin dependent diabetes   Dale Morrow, M.D. Ph.D.  [facility]` [address], [place], [place] [address] Ph: [phone] Fax: [phone]

## 2016-04-30 DIAGNOSIS — S066X9D Traumatic subarachnoid hemorrhage with loss of consciousness of unspecified duration, subsequent encounter: Secondary | ICD-10-CM | POA: Diagnosis not present

## 2016-04-30 DIAGNOSIS — G250 Essential tremor: Secondary | ICD-10-CM | POA: Diagnosis not present

## 2016-04-30 DIAGNOSIS — R4189 Other symptoms and signs involving cognitive functions and awareness: Secondary | ICD-10-CM | POA: Diagnosis not present

## 2016-04-30 DIAGNOSIS — E291 Testicular hypofunction: Secondary | ICD-10-CM | POA: Diagnosis not present

## 2016-04-30 DIAGNOSIS — S065X9D Traumatic subdural hemorrhage with loss of consciousness of unspecified duration, subsequent encounter: Secondary | ICD-10-CM | POA: Diagnosis not present

## 2016-04-30 DIAGNOSIS — I5022 Chronic systolic (congestive) heart failure: Secondary | ICD-10-CM | POA: Diagnosis not present

## 2016-04-30 DIAGNOSIS — S0532XD Ocular laceration without prolapse or loss of intraocular tissue, left eye, subsequent encounter: Secondary | ICD-10-CM | POA: Diagnosis not present

## 2016-04-30 DIAGNOSIS — E1165 Type 2 diabetes mellitus with hyperglycemia: Secondary | ICD-10-CM | POA: Diagnosis not present

## 2016-04-30 DIAGNOSIS — G510 Bell's palsy: Secondary | ICD-10-CM | POA: Diagnosis not present

## 2016-04-30 DIAGNOSIS — C61 Malignant neoplasm of prostate: Secondary | ICD-10-CM | POA: Diagnosis not present

## 2016-04-30 DIAGNOSIS — I5181 Takotsubo syndrome: Secondary | ICD-10-CM | POA: Diagnosis not present

## 2016-04-30 MED FILL — TOUJEO SOLOSTAR 300 UNITS/M: 300 | 58 days supply | Qty: 5 | Fill #0

## 2016-05-01 ENCOUNTER — Ambulatory Visit
Admission: RE | Admit: 2016-05-01 | Discharge: 2016-05-01 | Disposition: A | Discharge status: Home / Self Care | Payer: 59 | Source: Ambulatory Visit | Attending: Urology | Admitting: Urology

## 2016-05-01 DIAGNOSIS — M8589 Other specified disorders of bone density and structure, multiple sites: Secondary | ICD-10-CM | POA: Diagnosis not present

## 2016-05-01 DIAGNOSIS — M8580 Other specified disorders of bone density and structure, unspecified site: Secondary | ICD-10-CM

## 2016-05-02 DIAGNOSIS — R4189 Other symptoms and signs involving cognitive functions and awareness: Secondary | ICD-10-CM | POA: Diagnosis not present

## 2016-05-02 DIAGNOSIS — S065X9D Traumatic subdural hemorrhage with loss of consciousness of unspecified duration, subsequent encounter: Secondary | ICD-10-CM | POA: Diagnosis not present

## 2016-05-02 DIAGNOSIS — S0532XD Ocular laceration without prolapse or loss of intraocular tissue, left eye, subsequent encounter: Secondary | ICD-10-CM | POA: Diagnosis not present

## 2016-05-02 DIAGNOSIS — I5022 Chronic systolic (congestive) heart failure: Secondary | ICD-10-CM | POA: Diagnosis not present

## 2016-05-02 DIAGNOSIS — S066X9D Traumatic subarachnoid hemorrhage with loss of consciousness of unspecified duration, subsequent encounter: Secondary | ICD-10-CM | POA: Diagnosis not present

## 2016-05-02 DIAGNOSIS — G510 Bell's palsy: Secondary | ICD-10-CM | POA: Diagnosis not present

## 2016-05-02 DIAGNOSIS — E1165 Type 2 diabetes mellitus with hyperglycemia: Secondary | ICD-10-CM | POA: Diagnosis not present

## 2016-05-02 DIAGNOSIS — G250 Essential tremor: Secondary | ICD-10-CM | POA: Diagnosis not present

## 2016-05-02 DIAGNOSIS — I5181 Takotsubo syndrome: Secondary | ICD-10-CM | POA: Diagnosis not present

## 2016-05-06 ENCOUNTER — Other Ambulatory Visit: Payer: Self-pay

## 2016-05-06 VITALS — BP 110/72 | HR 66 | Resp 16 | Ht 68.0 in | Wt 172.2 lb

## 2016-05-06 DIAGNOSIS — I2510 Atherosclerotic heart disease of native coronary artery without angina pectoris: Secondary | ICD-10-CM | POA: Diagnosis not present

## 2016-05-06 DIAGNOSIS — G250 Essential tremor: Secondary | ICD-10-CM | POA: Diagnosis not present

## 2016-05-06 DIAGNOSIS — G510 Bell's palsy: Secondary | ICD-10-CM | POA: Diagnosis not present

## 2016-05-06 DIAGNOSIS — R569 Unspecified convulsions: Secondary | ICD-10-CM | POA: Diagnosis not present

## 2016-05-06 DIAGNOSIS — I5022 Chronic systolic (congestive) heart failure: Secondary | ICD-10-CM | POA: Diagnosis not present

## 2016-05-06 DIAGNOSIS — Z794 Long term (current) use of insulin: Principal | ICD-10-CM

## 2016-05-06 DIAGNOSIS — I5181 Takotsubo syndrome: Secondary | ICD-10-CM | POA: Diagnosis not present

## 2016-05-06 DIAGNOSIS — E119 Type 2 diabetes mellitus without complications: Secondary | ICD-10-CM | POA: Diagnosis not present

## 2016-05-06 DIAGNOSIS — R2689 Other abnormalities of gait and mobility: Secondary | ICD-10-CM | POA: Diagnosis not present

## 2016-05-06 DIAGNOSIS — E118 Type 2 diabetes mellitus with unspecified complications: Secondary | ICD-10-CM

## 2016-05-06 DIAGNOSIS — S065X9D Traumatic subdural hemorrhage with loss of consciousness of unspecified duration, subsequent encounter: Secondary | ICD-10-CM | POA: Diagnosis not present

## 2016-05-06 NOTE — Patient Outreach (Signed)
[facility]) Care Management   05/06/2016  BLESS CANINO [birth year] [id number]  MELBIN PACH is an 75 y.o. male.   Member seen for follow up office visit for Link to Wellness program for self management of Type 1 diabetes  Subjective: Member states he is slowly getting better.  Wife states that member is still getting PT twice a week for the next few weeks.  States he is using his cane now and his goal to is to not need to use the cane.  Wife states that she is now keeping up with giving member his insulin and checking his blood sugars.  States that he wore a continuous monitor for a few weeks and he saw the diabetic educator who states that it showed he had some low readings at night that he did not know he was having .  States that he is having higher readings in the morning and Dr.Kerr changed his long acting insulin to Toujeo last week.  States that his readings are slowly improving.  States that he is now going back to the gym twice a week and he was able to ride the exercise bike for 15 minutes.  States he would like to work up to longer times and going more times a week.    Objective:   Review of Systems  Neurological: Positive for tremors.  Psychiatric/Behavioral: Positive for memory loss.    Physical Exam  Constitutional: He is oriented to person, place, and time.  Neurological: He is alert and oriented to person, place, and time. Gait abnormal.   Today's Vitals   05/06/16 0950  BP: 110/72  Pulse: 66  Resp: 16  Weight: 172 lb 3.2 oz (78.1 kg)  Height: 1.727 m (5\' 8" )  PainSc: 0-No pain   Encounter Medications:   Outpatient Encounter Prescriptions as of 05/06/2016  Medication Sig  . acetaminophen (TYLENOL) 325 MG tablet Take 650 mg by mouth. Take 2 tablets every 8 hours - at 0600, 1400, and 2200  . CALCIUM-VITAMIN D PO Take 2 tablets by mouth daily at 12 noon.   . docusate sodium (COLACE) 100 MG capsule Take 100 mg by mouth daily as needed. Take one  capsule twice daily   . escitalopram (LEXAPRO) 20 MG tablet Take 20 mg by mouth daily.  . insulin aspart (NOVOLOG) 100 UNIT/ML injection Inject 4 Units into the skin 3 (three) times daily with meals. (Patient taking differently: Inject 1 Units into the skin 3 (three) times daily with meals. CBG 70-199-8 units CBG 200-299- 11 units CBG 300-399-14 units CBG 400 or greater- 17 units and call MD)  . Insulin Glargine (TOUJEO SOLOSTAR) 300 UNIT/ML SOPN Inject 23 Units into the skin daily before breakfast.  . lamoTRIgine (LAMICTAL) 100 MG tablet Take 1 tablet (100 mg total) by mouth 2 (two) times daily.  . Multiple Vitamins-Minerals (ONE-A-DAY MENS 50+ ADVANTAGE PO) Take 1 tablet by mouth daily at 12 noon.  . polyethylene glycol (MIRALAX / GLYCOLAX) packet Take 17 g by mouth at bedtime.  . propranolol (INDERAL) 10 MG tablet Take 1 tablet (10 mg total) by mouth 2 (two) times daily.  . rosuvastatin (CRESTOR) 10 MG tablet Take 10 mg by mouth daily.  . insulin aspart (NOVOLOG) 100 UNIT/ML injection 0-15 Units, Subcutaneous, 3 times daily with meals,  CBG < 70: implement hypoglycemia protocol CBG 70 - 120: 0 units CBG 121 - 150: 2 units CBG 151 - 200: 3 units CBG 201 - 250: 5 units CBG  251 - 300: 8 units CBG 301 - 350: 11 units CBG 351 - 400: 15 units CBG > 400: call MD (Patient not taking: Reported on 05/06/2016)  . Insulin Glargine (LANTUS) 100 UNIT/ML Solostar Pen Inject 21 Units into the skin daily. (Patient not taking: Reported on 05/06/2016)   No facility-administered encounter medications on file as of 05/06/2016.     Functional Status:   In your present state of health, do you have any difficulty performing the following activities: 05/06/2016 02/18/2016  Hearing? N N  Vision? N N  Difficulty concentrating or making decisions? Tempie Donning  Walking or climbing stairs? Y N  Dressing or bathing? N N  Doing errands, shopping? Y N  Some recent data might be hidden    Fall/Depression Screening:    PHQ  2/9 Scores 05/06/2016 11/03/2015  PHQ - 2 Score 0 0    Assessment:  Member seen for follow up office visit for Link to Wellness program for self management of Type 1 diabetes.  Member is not meeting his diabetes self management goal of hemoglobin A1C of 7.5% or below with last reading of 8.4%.  Member is still recovering from fall, subdural hematoma and seizure in May.  Member is still getting PT 2 times a week.  Member saw CDE at Dr. Cindra Eves office and the MD has changed his basal insulin to [facility] for his elevated blood sugars in the morning.  Member is adhering to low CHO diet but appetite is decreased since his hospitalization.  Member does carry glucotabs to use as needed.  Member up to date with dilated eye exam and dental check ups.  Wife is retiring from the [facility] system in August.  Member will be transitioning to [place] in September.  Member will be discharged from Link to Wellness when no longer eligible.   Plan:  1. Plan to continue to see [facility] for insulin adjustments.   2. Plan to exercise 2 times a week for 15-20 minutes 3. Plan to keep appointment with Dr. Buddy Duty on 06/18/16 4. Plan to see Link to Wellness phone call  06/21/16 at 10AM to discharge from program   [facility] CM Care Plan Problem One   Flowsheet Row Most Recent Value  Care Plan Problem One  Elevated blood sugars as evidenced by hemoglobin A1C of 8.4%  related to dx of Type 1 DM  Role Documenting the Problem One  Care Management Coordinator  Care Plan for Problem One  Active  [facility] Long Term Goal (31-90 days)  Member will decrease hemoglobin A1C to 7.5 or below in the next 90 days  [facility] Long Term Goal Start Date  05/06/16  Interventions for Problem One Long Term Goal  Reviewed blood sugar log and instructed to send to Dr.Kerr as directed and that member may need his dose of Toujeo adjusted if he continues to have elevated morning blood sugars, Instructed member and wife on use of Toujeo and how it is different  than his Lantus insulin, Reviewed CHO counting and portion control, Reinforced to try drinking Glucerna for dinner if he does not feel like eating a full meal, Encouraged to eat more nonstarchy vegetables with his meals, Encouraged to continue to go to gym and to try to increase his time on the bike and to try increasing the days he goes as tolerated, Reinforced how to treat hypoglycemia and the rule  of 15, Instructed to keep appointment with Dr.Kerr on 06/18/16, Reviewed with member and wife  how to do fingersticks to get the best specimen and with less pain     [facility] CM Care Plan Problem Two   Flowsheet Row Most Recent Value  Care Plan Problem Two  Recent d/c from hospital for fall with readmission in 2 days  Role Documenting the Problem Two  Care Management [place] for Problem Two  Not Active     Peter Garter RN, [facility] Care Management Coordinator-Link to [place] Management [phone]

## 2016-05-06 NOTE — Patient Instructions (Signed)
1. Plan to continue to see [facility] for insulin adjustments.   2. Plan to exercise 2 times a week for 15-20 minutes 3. Plan to keep appointment with Dr. Buddy Duty on 06/18/16 4. Plan to see Link to Wellness phone call  06/21/16 at 10AM

## 2016-05-07 DIAGNOSIS — C61 Malignant neoplasm of prostate: Secondary | ICD-10-CM | POA: Diagnosis not present

## 2016-05-08 DIAGNOSIS — R569 Unspecified convulsions: Secondary | ICD-10-CM | POA: Diagnosis not present

## 2016-05-08 DIAGNOSIS — S065X9D Traumatic subdural hemorrhage with loss of consciousness of unspecified duration, subsequent encounter: Secondary | ICD-10-CM | POA: Diagnosis not present

## 2016-05-08 DIAGNOSIS — E119 Type 2 diabetes mellitus without complications: Secondary | ICD-10-CM | POA: Diagnosis not present

## 2016-05-08 DIAGNOSIS — I5181 Takotsubo syndrome: Secondary | ICD-10-CM | POA: Diagnosis not present

## 2016-05-08 DIAGNOSIS — I2510 Atherosclerotic heart disease of native coronary artery without angina pectoris: Secondary | ICD-10-CM | POA: Diagnosis not present

## 2016-05-08 DIAGNOSIS — I5022 Chronic systolic (congestive) heart failure: Secondary | ICD-10-CM | POA: Diagnosis not present

## 2016-05-08 DIAGNOSIS — G250 Essential tremor: Secondary | ICD-10-CM | POA: Diagnosis not present

## 2016-05-08 DIAGNOSIS — R2689 Other abnormalities of gait and mobility: Secondary | ICD-10-CM | POA: Diagnosis not present

## 2016-05-08 DIAGNOSIS — G510 Bell's palsy: Secondary | ICD-10-CM | POA: Diagnosis not present

## 2016-05-13 MED FILL — ROSUVASTATIN CALCIUM 10 MG: 10 | 30 days supply | Qty: 30 | Fill #0

## 2016-05-13 MED FILL — PROPRANOLOL 10 MG TABLET: 10 | 30 days supply | Qty: 60 | Fill #1

## 2016-05-13 MED FILL — lamoTRIgine 100 MG TABS: 100 | 30 days supply | Qty: 60 | Fill #1

## 2016-05-16 DIAGNOSIS — M8580 Other specified disorders of bone density and structure, unspecified site: Secondary | ICD-10-CM | POA: Diagnosis not present

## 2016-05-16 DIAGNOSIS — C61 Malignant neoplasm of prostate: Secondary | ICD-10-CM | POA: Diagnosis not present

## 2016-05-17 MED FILL — TRUEPLUS SYR 0.5ML 31GX5/16: 31G X 5/16" | 50 days supply | Qty: 200 | Fill #1

## 2016-05-19 DIAGNOSIS — M6281 Muscle weakness (generalized): Secondary | ICD-10-CM | POA: Diagnosis not present

## 2016-05-22 ENCOUNTER — Telehealth: Payer: Self-pay | Admitting: Neurology

## 2016-05-22 NOTE — Telephone Encounter (Signed)
Spoke to [name] (wife on HIPPA) - states patient has been on Lamictal 100mg , BID x 1 month.  He has been experiencing some mild to moderate itching.  Benadryl has been helpful for this symptom. He has now developed a rash on his back and she is concerned it is related to his medication.  He has been placed on [name]'s schedule for tomorrow morning.

## 2016-05-22 NOTE — Telephone Encounter (Signed)
Wife called to advise, husband has itchiness and rash on arm, thinks this may be a reaction to lamoTRIgine (LAMICTAL) 100 MG tablet.

## 2016-05-23 ENCOUNTER — Other Ambulatory Visit: Payer: Self-pay

## 2016-05-23 ENCOUNTER — Encounter: Payer: Self-pay | Admitting: Nurse Practitioner

## 2016-05-23 ENCOUNTER — Ambulatory Visit (INDEPENDENT_AMBULATORY_CARE_PROVIDER_SITE_OTHER): Payer: 59 | Admitting: Nurse Practitioner

## 2016-05-23 VITALS — BP 107/67 | HR 57 | Ht 68.0 in | Wt 174.4 lb

## 2016-05-23 DIAGNOSIS — R569 Unspecified convulsions: Secondary | ICD-10-CM

## 2016-05-23 DIAGNOSIS — S065XAA Traumatic subdural hemorrhage with loss of consciousness status unknown, initial encounter: Secondary | ICD-10-CM

## 2016-05-23 DIAGNOSIS — G250 Essential tremor: Secondary | ICD-10-CM | POA: Diagnosis not present

## 2016-05-23 DIAGNOSIS — S065X9A Traumatic subdural hemorrhage with loss of consciousness of unspecified duration, initial encounter: Secondary | ICD-10-CM

## 2016-05-23 DIAGNOSIS — I6200 Nontraumatic subdural hemorrhage, unspecified: Secondary | ICD-10-CM | POA: Diagnosis not present

## 2016-05-23 DIAGNOSIS — R413 Other amnesia: Secondary | ICD-10-CM

## 2016-05-23 NOTE — Progress Notes (Signed)
[facility]  PATIENT: Dale Morrow DOB: 1941-07-10   REASON FOR VISIT: Follow-up for seizure disorder, history of subarachnoid hemorrhage, Bell's palsy on the left HISTORY FROM: Patient and wife    HISTORY OF PRESENT ILLNESS:Dale Morrow is a 75 years old referred by primary care physician for evaluation of tremor and left hemifacial spasm.  He has family history of tremor, mother at her old age,  his sister, maternal uncle all had bilateral hand tremor, he also has past medical history of hypertension, diabetes, insulin pump, hyperlipidemia, prostate cancer, status post radiation therapy history of left knee replacement  He  has a history of bilateral Bell's palsy, with residual left upper and lower face weakness  He noticed bilateral hand shaking since 2004,  difficulty writing, holding utensils, around 2009, he also noticed voice change, there was a tremorous quality to it  He denies loss sense of smell, and there was no REM sleep disorder, no gait difficulty. TSH was normal.   He was placed on propranolol in 2013, with moderate response, with bp at low normal side 100/66, primidone 50mg  qhs was added on since Nov 2013, He reported mild  improvement with primidone 50 mg 2 tablets twice a day.   I saw him regularly in the past for EMG guided xeomin injection since November 2014, he responded very well, last injection was in May of 2015. He had bilateral blephoplasty by [place] ophthalmologist in February 2016,  which has helped his left facial spasm, he also had left knee redo in April 2016, still complains of moderate constant left knee pain, on Dilaudid, oxycodone treatment, is going for rehabilitation, sometimes his left knee is so painful, make him nauseous,  He came back to clinic today complains of worsening bilateral hands tremor, difficulty holding a cup of coffee, holding utensils, cause inconvenient, social embarrassment, also noticed head  titubation, increase voice tremor, needs to repeat himself sometimes.  He is now taking propranolol 60 mg twice a day, primidone 50 mg 2 tablets 3 times a day, propranolol works better for him, blood pressure was in the range of 130-140.  UPDATE Jul 24 2015:YY He was admitted to the hospital in July 2016 for prolonged seizure due to hypoglycemia, blood glucose was dropped to 30s, EEG that time showed mild background slowing. He is now on insulin pump, also had left carpal tunnel, ulnar decompression surgery, which did help his complains of left hand paresthesia,  He has mild unsteady gait, continue complains of mild bilateral hands tremor, he is taking Inderal 80 mg twice a day, primidone 50 mg 3 times a day  UPDATE Oct 30 2015:YY He recently had MRI cervical spine by Dr. Burney Gauze, he still has bilateral hand paresthesia, no gait difficulty, no incontinence.  His diabetes is under good control, use insulin pump. He still has intermittent hand tremor, taking inderal LA 60mg  daily.  Update June first 2017:YY He was doing very well until May first 2017, he was found down at home, with severe left eye swelling, bruise, confused, he was taken to the emergency room, upon arrival, glucose level more than 600, he has no recollection of the event, We have personally reviewed CAT scan: acute traumatic subarachnoid hemorrhage involving both cerebral hemispheres as above, with additional 13 mm parenchymal contusion at the left temporal pole. Small volume acute subdural hemorrhage overlying the right frontal convexity and falx.  Small volume acute intraventricular hemorrhage within the occipital horns of both lateral ventricles.  Large left  frontal/periorbital scalp contusion. No calvarial fracture. EEG showed diffuse slowing, he is now at the rehabilitation, Reviewed laboratory, mild anemia hemoglobin 10 point 9, normal BMP, negative troponin  UPDATE July 17th 2017:YYhe complains of short term memory  loss, He could not remember that he had shower, will do it again, EEG showed slowing, no epileptiform, he is tolerating lamotrigine 100mg  bid, he is now back home, he is still receiving PT at home, he complains of left knee pain,  He only has mild bilateral hands tremor, taking primidone 50 mg 2 tabs po tid, will taper off primidone,  UPDATE 08/10/2017CM Dale Morrow, 75 year old male returns for visit due to questionable rash on his back. He has had some itchiness and wife was thinking this may be a reaction to this Lamictal. He is been an 100 mg twice daily since June 1. She also relates that he has a history of sensitive skin. There has been no change in soap  or powders. She also reports he had an episode of confusion 2 days ago unsure of his surroundings lasted for an hour or so better later on in the day. He returns for reevaluation. Records reviewed previously seen by Dr. Krista Blue.  REVIEW OF SYSTEMS: Full 14 system review of systems performed and notable only for those listed, all others are neg:  Constitutional: neg  Cardiovascular: neg Ear/Nose/Throat: neg  Skin: neg Eyes: neg Respiratory: neg Gastroitestinal: neg  Hematology/Lymphatic: neg  Endocrine: neg Musculoskeletal:neg Allergy/Immunology: neg Neurological: neg Psychiatric: neg Sleep : neg   ALLERGIES: Allergies  Allergen Reactions  . Bee Venom Anaphylaxis  . Shellfish Allergy Anaphylaxis  . Augmentin [Amoxicillin-Pot Clavulanate] Rash    HOME MEDICATIONS: Outpatient Medications Prior to Visit  Medication Sig Dispense Refill  . acetaminophen (TYLENOL) 325 MG tablet Take 650 mg by mouth 3 (three) times daily as needed. Take 2 tablets every 8 hours - at 0600, 1400, and 2200    . CALCIUM-VITAMIN D PO Take 1 tablet by mouth 2 (two) times daily.     [name] docusate sodium (COLACE) 100 MG capsule Take 100 mg by mouth at bedtime. Take one capsule twice daily     . escitalopram (LEXAPRO) 20 MG tablet Take 20 mg by mouth daily.    .  insulin aspart (NOVOLOG) 100 UNIT/ML injection 0-15 Units, Subcutaneous, 3 times daily with meals,  CBG < 70: implement hypoglycemia protocol CBG 70 - 120: 0 units CBG 121 - 150: 2 units CBG 151 - 200: 3 units CBG 201 - 250: 5 units CBG 251 - 300: 8 units CBG 301 - 350: 11 units CBG 351 - 400: 15 units CBG > 400: call MD  11  . insulin aspart (NOVOLOG) 100 UNIT/ML injection Inject 4 Units into the skin 3 (three) times daily with meals. (Patient taking differently: Inject 1 Units into the skin 3 (three) times daily with meals. CBG 70-199-8 units CBG 200-299- 11 units CBG 300-399-14 units CBG 400 or greater- 17 units and call MD)    . Insulin Glargine (TOUJEO SOLOSTAR) 300 UNIT/ML SOPN Inject 23 Units into the skin daily before breakfast.    . lamoTRIgine (LAMICTAL) 100 MG tablet Take 1 tablet (100 mg total) by mouth 2 (two) times daily. 60 tablet 11  . Multiple Vitamins-Minerals (ONE-A-DAY MENS 50+ ADVANTAGE PO) Take 1 tablet by mouth daily at 12 noon.    . polyethylene glycol (MIRALAX / GLYCOLAX) packet Take 17 g by mouth at bedtime.    . propranolol (INDERAL) 10 MG tablet  Take 1 tablet (10 mg total) by mouth 2 (two) times daily. 60 tablet 11  . rosuvastatin (CRESTOR) 10 MG tablet Take 10 mg by mouth daily.    . Insulin Glargine (LANTUS) 100 UNIT/ML Solostar Pen Inject 21 Units into the skin daily. (Patient not taking: Reported on 05/06/2016)     No facility-administered medications prior to visit.     PAST MEDICAL HISTORY: Past Medical History:  Diagnosis Date  . Arthritis    left knee  . Aspiration pneumonia ([place])   . Chronic systolic CHF (congestive heart failure) ([place])    a. 04/2015 Echo: EF 25-30%.  . Complication of anesthesia    pt denies ponv, pt woke up during colonscopy in past  . De Quervain's tenosynovitis, left 04/2012  . Depression   . Diabetes mellitus    Insulin pump, takes lisinopril for kidneys, dr Buddy Duty endocrinologist  . Essential tremor   . Fever   . GERD  (gastroesophageal reflux disease)   . Intracranial hemorrhage ([place])   . Laceration of left eye    Traumatic, with significant swelling  . Left-sided Bell's palsy   . Nonischemic cardiomyopathy ([place])    a. 04/2015 Echo: EF 25-30%, mid apical, antseptal, anterolat, inflat, infsept, apical AK. ? takotsubo. No thrombus. PASP 68mmHg;  b. 04/2014 Lexi MV: EF 29%, fixed mod size and intensity mid-dist ant, apical, inferior scar w/ wma's;  c. 04/2015 Cath: LM nl, LAD 50p/m, LCX 25p, RI nl, RCA nl, RPAV 50d-->Med Rx.  . Normocytic anemia   . PONV (postoperative nausea and vomiting)   . Prostate cancer, primary, with metastasis from prostate to other site [facility])    a. metastasis to lymph nodes;  b. s/p XRT.  [name] Right-sided Bell's palsy   . SDH (subdural hematoma) ([place])   . Seizure-like activity ([place])   . Status epilepticus ([place])    Due to hypoglycemia  . Takotsubo cardiomyopathy    H/O, EF 55%  . TBI (traumatic brain injury) ([place])    With [facility] and SDH  . Tremors of nervous system    takes propranolol for tremors    PAST SURGICAL HISTORY: Past Surgical History:  Procedure Laterality Date  . ANTERIOR CRUCIATE LIGAMENT REPAIR     right knee  . CARDIAC CATHETERIZATION N/A 04/26/2015   Procedure: Left Heart Cath and Coronary Angiography;  Surgeon: Jettie Booze, MD;  Location: [place] [facility];  Service: Cardiovascular;  Laterality: N/A;  . CARDIAC CATHETERIZATION  04/26/2015   Procedure: Intravascular Ultrasound/IVUS;  Surgeon: Jettie Booze, MD;  Location: [place] [facility];  Service: Cardiovascular;;  . CARPAL TUNNEL RELEASE Left 07/05/2015   Procedure: LEFT CARPAL TUNNEL RELEASE;  Surgeon: Charlotte Crumb, MD;  Location: [place];  Service: Orthopedics;  Laterality: Left;  . COLONOSCOPY WITH PROPOFOL N/A 04/07/2014   Procedure: COLONOSCOPY WITH PROPOFOL;  Surgeon: Jeryl Columbia, MD;  Location: [facility];  Service: Endoscopy;  Laterality: N/A;  . DORSAL COMPARTMENT  RELEASE  04/21/2012   Procedure: RELEASE DORSAL COMPARTMENT (DEQUERVAIN);  Surgeon: Cammie Sickle., MD;  Location: [facility];  Service: Orthopedics;  Laterality: Left;  left 1st dorsal compartment release left wrist   . HEMORRHOID SURGERY  1970s  . KNEE ARTHROSCOPY  09/25/2007; 04/15/2005   left  . KNEE ARTHROTOMY  03/18/2008   left; with scar exc.  [name] KNEE JOINT MANIPULATION  06/29/2007   left  . MINOR AMPUTATION OF DIGIT Left 07/21/2013   Procedure: LEFT SMALL REVISION  AMPUTATION (MINOR PROCEDURE) ;  Surgeon: Schuyler Amor, MD;  Location: [place];  Service: Orthopedics;  Laterality: Left;  . TOTAL KNEE ARTHROPLASTY  04/29/2007   left  . TOTAL KNEE REVISION Left 01/25/2015   Procedure: LEFT TOTAL KNEE  ARTHOPLASTY REVISION;  Surgeon: Gaynelle Arabian, MD;  Location: [facility];  Service: Orthopedics;  Laterality: Left;  . ULNAR TUNNEL RELEASE Left 07/05/2015   Procedure: LEFT CUBITAL TUNNEL RELEASE;  Surgeon: Charlotte Crumb, MD;  Location: [place];  Service: Orthopedics;  Laterality: Left;  . WRIST ARTHROSCOPY  05/03/2010   right; and release of 1st dorsal compartment    FAMILY HISTORY: Family History  Problem Relation Age of Onset  . Heart Problems Father   . Heart attack Father   . Hypertension Mother   . Stroke Mother     SOCIAL HISTORY: Social History   Social History  . Marital status: Married    Spouse name: Dolph Sotto  . Number of children: o  . Years of education: college   Occupational History  . retired Orthoptist    Social History Main Topics  . Smoking status: Former Smoker    Years: 15.00    Types: Cigarettes    Quit date: 10/13/1979  . Smokeless tobacco: Never Used  . Alcohol use No  . Drug use: No  . Sexual activity: Not on file   Other Topics Concern  . Not on file   Social History Narrative   Patient lives at home with his wife [name]) and there four dogs.Admitted to [facility]/20/16     Retired-Sales Rrepresentative   Education two years of college.   Right handed.   Caffeine one cup of coffee daily.   Former smoker-stopped 1980   Alcohol none   FULL CODE     PHYSICAL EXAM  Vitals:   05/23/16 1105  BP: 107/67  Pulse: (!) 57  Weight: 174 lb 6.4 oz (79.1 kg)  Height: 5\' 8"  (1.727 m)   Body mass index is 26.52 kg/m.  Generalized: Well developed, in no acute distress  Head: normocephalic and atraumatic,. Oropharynx benign  Neck: Supple, no carotid bruits  Cardiac: Regular rate rhythm, no murmur  Musculoskeletal: No deformity  Skin I see no evidence of rash to his back, he has numerous molds, he has dry skin  Neurological examination   Mentation: Alert MMSE  Not repeated   Follows all commands speech and language fluent.   Cranial nerve II-XII: Pupils were equal round reactive to light extraocular movements were full, visual field were full on confrontational test. Facial asymmetry on the left with cheek puff weakness  hearing was intact to finger rubbing bilaterally. Uvula tongue midline. head turning and shoulder shrug were normal and symmetric.Tongue protrusion into cheek strength was normal. Motor: normal bulk and tone, full strength in the BUE, BLE, fine finger movements normal, no pronator drift. Mild postural tremor Sensory: normal and symmetric to light touch,  Coordination: finger-nose-finger, heel-to-shin bilaterally, no dysmetria Reflexes: Brachioradialis 2/2, biceps 2/2, triceps 2/2, patellar 2/2, Achilles absent  plantar responses were flexor bilaterally. Gait and Station: Rising up from seated position without assistance, ambulates with a single-point cane mildly unsteady gait    DIAGNOSTIC DATA (LABS, IMAGING, TESTING) - I reviewed patient records, labs, notes, testing and imaging myself where available.  Lab Results  Component Value Date   WBC 5.7 02/22/2016   HGB 10.8 (A) 02/22/2016   HCT 33 (A) 02/22/2016   MCV 91.9  02/20/2016   PLT  296 02/22/2016      Component Value Date/Time   NA 135 (A) 02/22/2016   K 4.5 02/22/2016   CL 96 (L) 02/20/2016 0638   CO2 25 02/20/2016 0638   GLUCOSE 161 (H) 02/20/2016 0638   BUN 14 02/22/2016   CREATININE 0.8 02/22/2016   CREATININE 0.80 02/20/2016 0638   CALCIUM 8.1 (L) 02/20/2016 0638   PROT 5.9 (L) 02/20/2016 0638   ALBUMIN 2.9 (L) 02/20/2016 0638   AST 29 02/20/2016 0638   ALT 19 02/20/2016 0638   ALKPHOS 65 02/20/2016 0638   BILITOT 0.7 02/20/2016 0638   GFRNONAA >60 02/20/2016 0638   GFRAA >60 02/20/2016 0638   Lab Results  Component Value Date   CHOL 138 04/26/2015   HDL 33 (L) 04/26/2015   LDLCALC 93 04/26/2015   TRIG 61 04/26/2015   CHOLHDL 4.2 04/26/2015   Lab Results  Component Value Date   HGBA1C 8.4 (H) 02/13/2016   Lab Results  Component Value Date   VITAMINB12 850 04/22/2015   Lab Results  Component Value Date   TSH 0.551 04/20/2015      ASSESSMENT AND PLAN  75 year old male with history of mild cognitive impairment, essential tremor left hemifacial spasm and seizure disorder currently on Lamictal 100 mg twice daily since June 1. Wife called the office and was having a skin reaction. I see no evidence of rash on his back however he has multiple moles and dry skin. Episode of confusion 2 days ago. The patient is a current patient of Dr. Krista Blue  who is out of the office today . This note is sent to the work in doctor.     Will check Lamictal level today Stay well hydrated, lotions to dry skin Follow-up with Dr. Krista Blue is planned Dennie Bible, [facility], [facility], APRN  [facility] [address], [place],  [address] [phone]

## 2016-05-23 NOTE — Patient Outreach (Signed)
[facility]) Care Management  05/23/2016  OAKES RADECKI 10-18-1940 [id number]   Member discussed in [facility] case conference.  Member will be changing to Medicare as of 06/14/16 and case will be closed after transition.   Peter Garter RN, [facility] Care Management Coordinator-Link to [place] Management [phone]

## 2016-05-23 NOTE — Patient Instructions (Signed)
Will check Lamictal level today Stay well hydrated, lotions to dry skin Follow-up with Dr. Krista Blue is planned

## 2016-05-25 LAB — LAMOTRIGINE LEVEL: LAMOTRIGINE LVL: 3.7 ug/mL (ref 2.0–20.0)

## 2016-05-27 ENCOUNTER — Telehealth: Payer: Self-pay | Admitting: *Deleted

## 2016-05-27 NOTE — Progress Notes (Signed)
I agree with the assessment and plan as directed by NP .   Decklan Mau, MD  

## 2016-05-27 NOTE — Telephone Encounter (Signed)
Spoke to wife and relayed lamictal level therapeutic.  Continue on current course.  She verbalized understanding.

## 2016-05-27 NOTE — Telephone Encounter (Signed)
Wife returned Sandy's call

## 2016-05-27 NOTE — Telephone Encounter (Signed)
-----   Message from Dennie Bible, NP sent at 05/27/2016  8:37 AM EDT ----- Lamictal dose is therapeutic, please call wife for patient

## 2016-05-27 NOTE — Telephone Encounter (Signed)
LMVM for pt or wife to return call for lab results.

## 2016-06-07 MED FILL — AMOXICILLIN 500 MG CAPSULE: 500 | 4 days supply | Qty: 16 | Fill #1

## 2016-06-07 MED FILL — HUMALOG 100 UNITS/ML KWIKPE: 100 | 29 days supply | Qty: 15 | Fill #0

## 2016-06-10 MED FILL — ESCITALOPRAM 20 MG TABLET: 20 | 90 days supply | Qty: 45 | Fill #4

## 2016-06-10 MED FILL — lamoTRIgine 100 MG TABS: 100 | 30 days supply | Qty: 60 | Fill #2

## 2016-06-10 MED FILL — PROPRANOLOL 10 MG TABLET: 10 | 30 days supply | Qty: 60 | Fill #2

## 2016-06-12 DIAGNOSIS — I469 Cardiac arrest, cause unspecified: Secondary | ICD-10-CM | POA: Diagnosis not present

## 2016-06-14 DIAGNOSIS — 419620001 Death: Secondary | SNOMED CT | POA: Diagnosis not present

## 2016-06-14 DEATH — deceased

## 2016-06-19 DIAGNOSIS — M6281 Muscle weakness (generalized): Secondary | ICD-10-CM | POA: Diagnosis not present

## 2016-06-20 ENCOUNTER — Ambulatory Visit: Payer: Self-pay

## 2016-06-20 ENCOUNTER — Other Ambulatory Visit: Payer: Self-pay

## 2016-06-20 NOTE — Patient Outreach (Signed)
[facility]) Care Management  06/20/2016  Dale Morrow [birth year] [id number]   Case closed.  Member expired on 2016-07-08.  Peter Garter RN, [facility] Care Management Coordinator-Link to [place] Management [phone]

## 2016-06-21 ENCOUNTER — Ambulatory Visit: Payer: 59

## 2016-07-19 DIAGNOSIS — M6281 Muscle weakness (generalized): Secondary | ICD-10-CM | POA: Diagnosis not present

## 2016-10-12 IMAGING — CT CT ABD-PELV W/ CM
2 of 5 series · 17 of 46 positions shown, 19 images · IV contrast (OMNIPAQUE 300)
Comparison: 03/30/2014

CLINICAL DATA: Abdominal pain

EXAM:
CT ABDOMEN AND PELVIS WITH CONTRAST
TECHNIQUE: Multidetector CT imaging of the abdomen and pelvis was performed
using the standard protocol following bolus administration of
intravenous contrast.
CONTRAST:  100mL OMNIPAQUE IOHEXOL 300 MG/ML  SOLN

[Series 2: abd/pel with · axial · 0.94mm/px · z∈[-552,-166]mm · 14 of 87 slices shown, 16 images]
[im 5/87  soft-tissue]
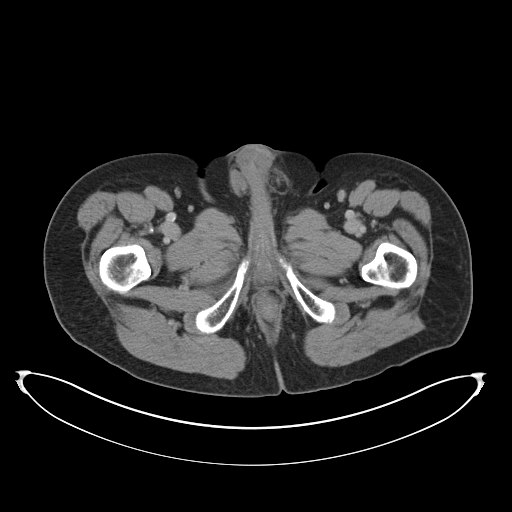
[im 5/87  bone]
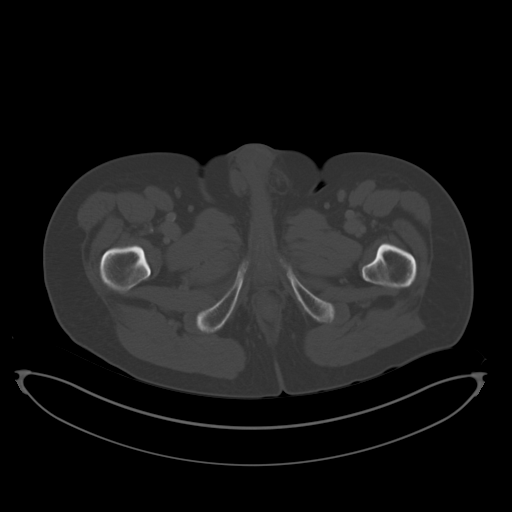
[im 10/87  soft-tissue]
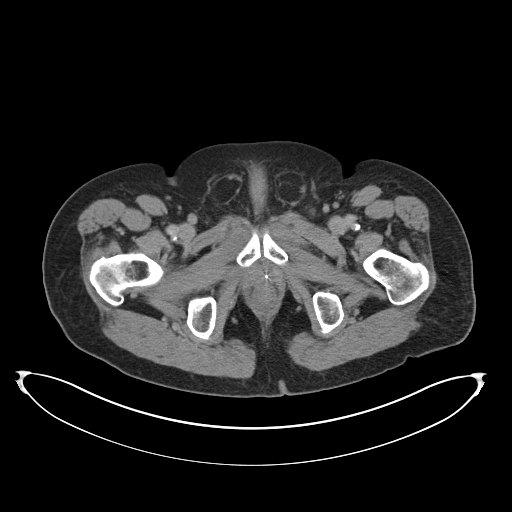
[im 20/87  soft-tissue]
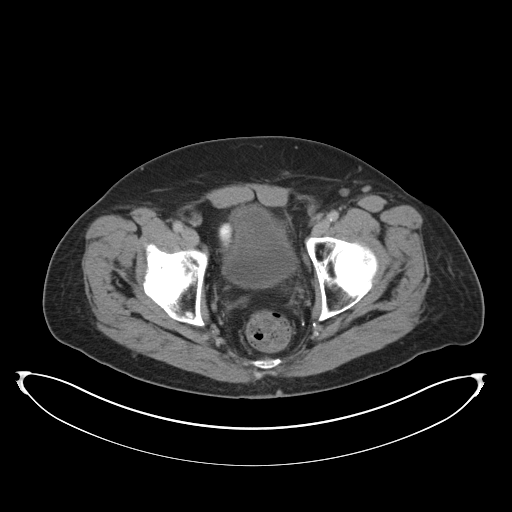
[im 24/87  soft-tissue]
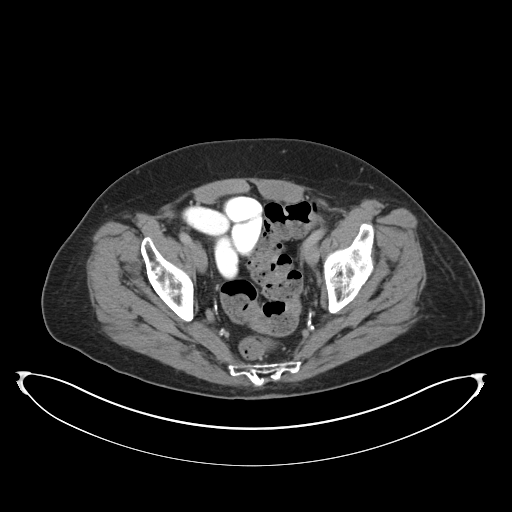
[im 29/87  soft-tissue]
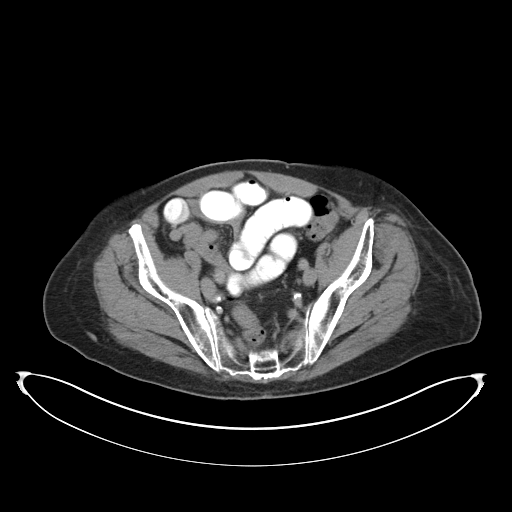
[im 34/87  soft-tissue]
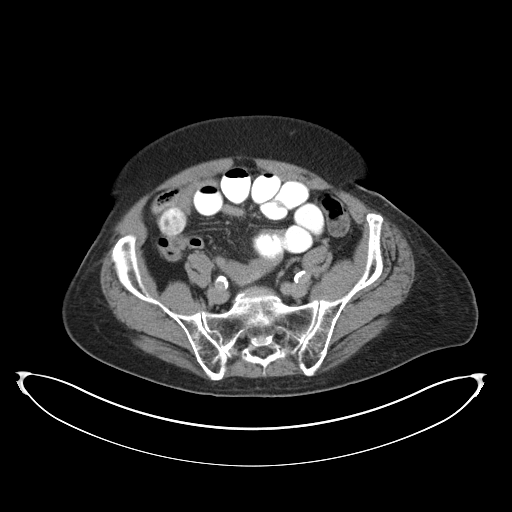
[im 39/87  soft-tissue]
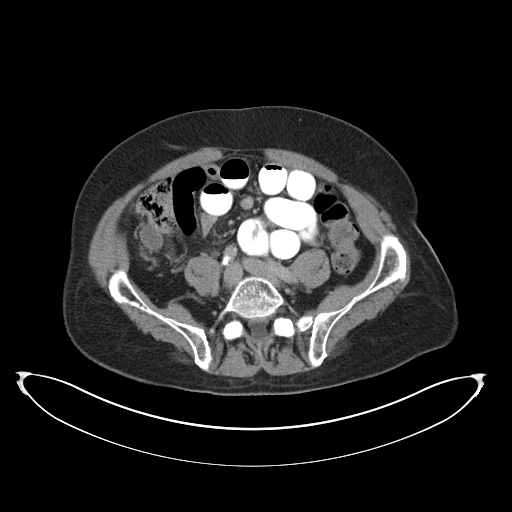
[im 48/87  soft-tissue]
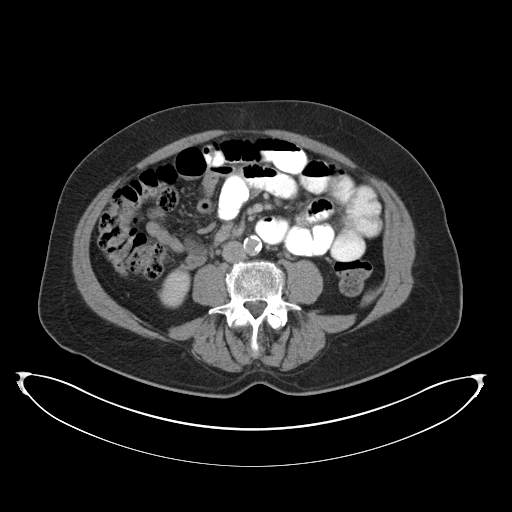
[im 53/87  soft-tissue]
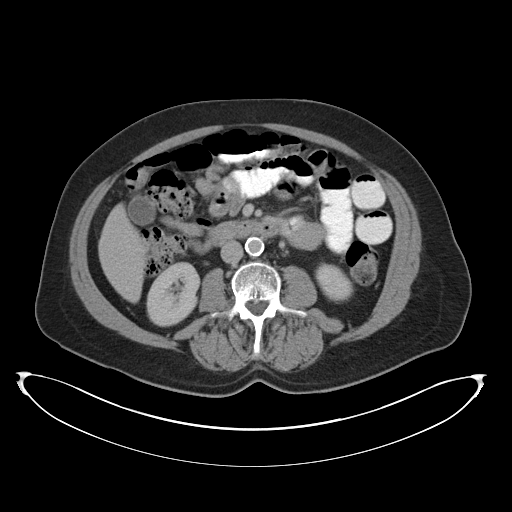
[im 53/87  bone]
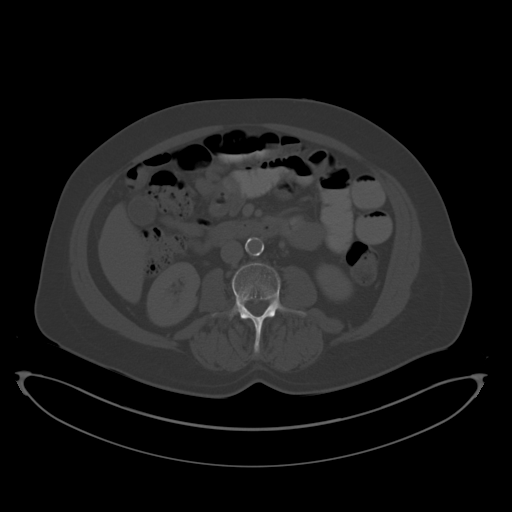
[im 58/87  soft-tissue]
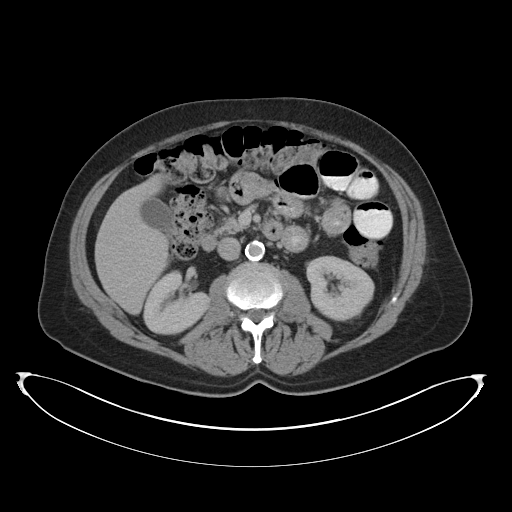
[im 63/87  soft-tissue]
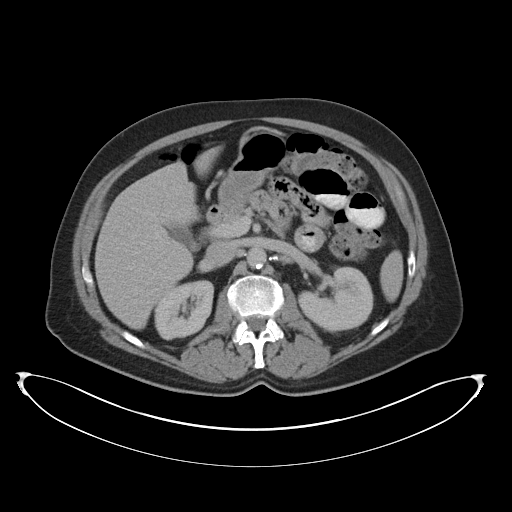
[im 67/87  soft-tissue]
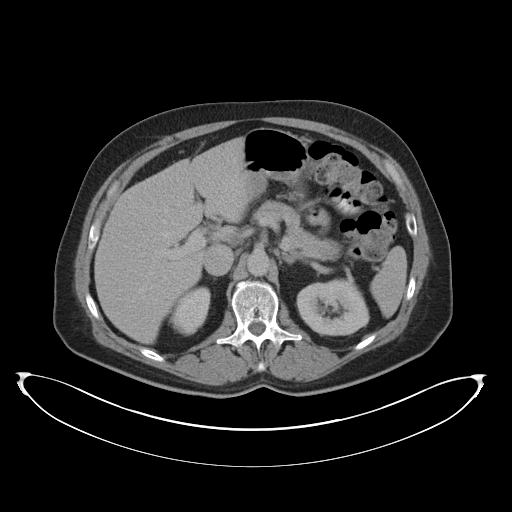
[im 77/87  soft-tissue]
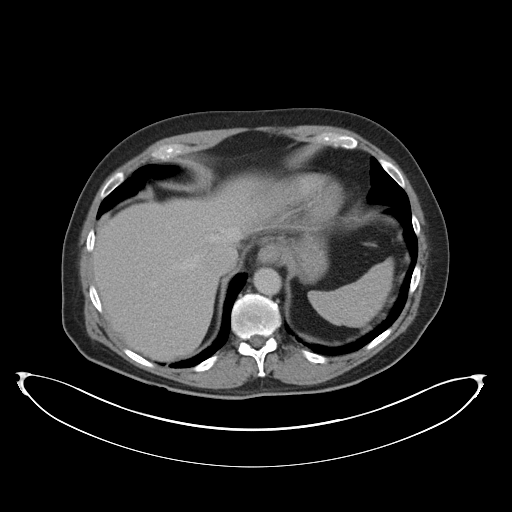
[im 82/87  soft-tissue]
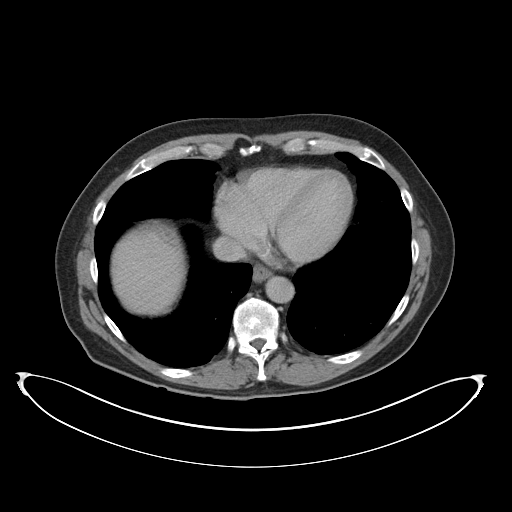

[Series 3: coronal a/|p · coronal · 0.80mm/px · 3 of 96 slices shown]
[im 32/96  soft-tissue]
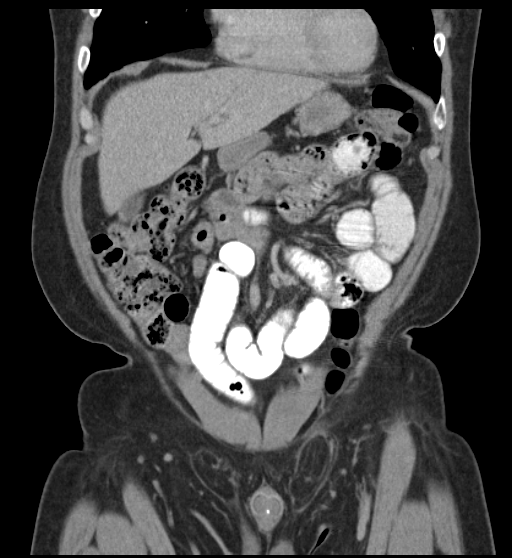
[im 43/96  soft-tissue]
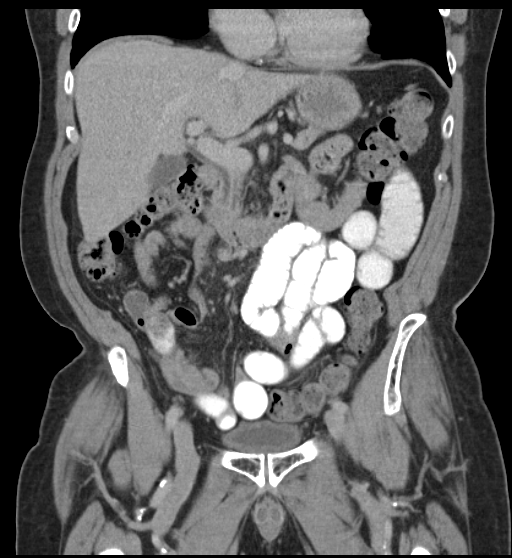
[im 53/96  soft-tissue]
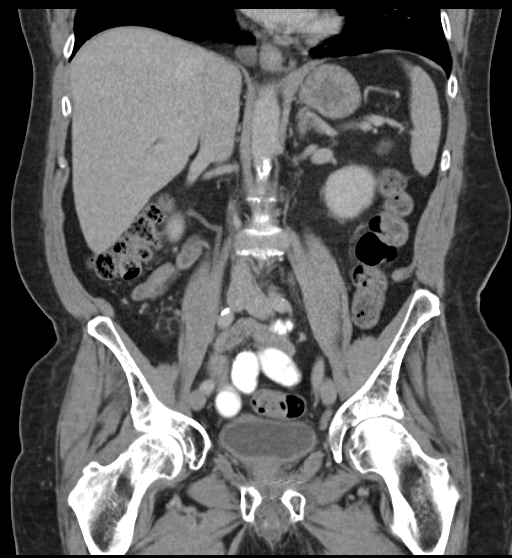

[17 of 46 positions shown; findings below may reference images not displayed]

FINDINGS: Lower chest:  No pleural effusion.  The lung bases appear clear.

Hepatobiliary: Low-attenuation structure within the left hepatic
lobe is stable measuring 6 mm. Too small to characterize but likely
small cysts. The gallbladder is normal. No biliary dilatation.

Pancreas: Negative

Spleen: Negative

Adrenals/Urinary Tract: Negative appearance of the adrenal glands.
The right kidney appears normal. Stone within the inferior pole of
the right kidney measures 3 mm, image 31/series 2. No hydronephrosis
or hydroureter.

Stomach/Bowel: The stomach is within normal limits. The small bowel
loops have a normal course and caliber. No obstruction. Normal
appearance of the colon. The appendix is visualized and appears
normal.

Vascular/Lymphatic: Calcified atherosclerotic disease involves the
abdominal aorta. No aneurysm. No enlarged retroperitoneal or
mesenteric adenopathy. No enlarged pelvic or inguinal lymph nodes.

Reproductive: Seed implants are noted within the prostate gland.

Other: There is no ascites or focal fluid collections within the
abdomen or pelvis. Small fat containing inguinal hernias noted.
There is also a small fat containing umbilical hernia.

Musculoskeletal: Negative.
IMPRESSION: 1. No acute findings identified within the abdomen or pelvis.
2. Aortic atherosclerosis.
3. Inguinal hernias contain fat only.

## 2016-11-04 ENCOUNTER — Ambulatory Visit: Payer: 59 | Admitting: Neurology
